# Patient Record
Sex: Female | Born: 1965 | Race: Black or African American | Hispanic: No | Smoking: Never smoker
Health system: Southern US, Community
[De-identification: ages and names within clinical notes are randomized; demographics above are authoritative.]

## PROBLEM LIST (undated history)

## (undated) DIAGNOSIS — I10 Essential (primary) hypertension: Secondary | ICD-10-CM

## (undated) DIAGNOSIS — G4730 Sleep apnea, unspecified: Secondary | ICD-10-CM

## (undated) DIAGNOSIS — F419 Anxiety disorder, unspecified: Secondary | ICD-10-CM

## (undated) DIAGNOSIS — G2581 Restless legs syndrome: Secondary | ICD-10-CM

## (undated) DIAGNOSIS — F329 Major depressive disorder, single episode, unspecified: Secondary | ICD-10-CM

## (undated) DIAGNOSIS — N920 Excessive and frequent menstruation with regular cycle: Secondary | ICD-10-CM

## (undated) DIAGNOSIS — E785 Hyperlipidemia, unspecified: Secondary | ICD-10-CM

## (undated) DIAGNOSIS — K635 Polyp of colon: Secondary | ICD-10-CM

## (undated) DIAGNOSIS — T7840XA Allergy, unspecified, initial encounter: Secondary | ICD-10-CM

## (undated) DIAGNOSIS — F32A Depression, unspecified: Secondary | ICD-10-CM

## (undated) DIAGNOSIS — Z9889 Other specified postprocedural states: Secondary | ICD-10-CM

## (undated) DIAGNOSIS — R112 Nausea with vomiting, unspecified: Secondary | ICD-10-CM

## (undated) DIAGNOSIS — R7303 Prediabetes: Secondary | ICD-10-CM

## (undated) DIAGNOSIS — E669 Obesity, unspecified: Secondary | ICD-10-CM

## (undated) DIAGNOSIS — K219 Gastro-esophageal reflux disease without esophagitis: Secondary | ICD-10-CM

## (undated) HISTORY — DX: Obesity, unspecified: E66.9

## (undated) HISTORY — DX: Restless legs syndrome: G25.81

## (undated) HISTORY — DX: Major depressive disorder, single episode, unspecified: F32.9

## (undated) HISTORY — DX: Allergy, unspecified, initial encounter: T78.40XA

## (undated) HISTORY — DX: Sleep apnea, unspecified: G47.30

## (undated) HISTORY — PX: TUBAL LIGATION: SHX77

## (undated) HISTORY — PX: FOOT SURGERY: SHX648

## (undated) HISTORY — DX: Hyperlipidemia, unspecified: E78.5

## (undated) HISTORY — DX: Excessive and frequent menstruation with regular cycle: N92.0

## (undated) HISTORY — DX: Polyp of colon: K63.5

## (undated) HISTORY — DX: Gastro-esophageal reflux disease without esophagitis: K21.9

## (undated) HISTORY — DX: Depression, unspecified: F32.A

## (undated) HISTORY — PX: COLONOSCOPY: SHX174

## (undated) HISTORY — DX: Essential (primary) hypertension: I10

## (undated) HISTORY — DX: Anxiety disorder, unspecified: F41.9

---

## 1998-01-27 ENCOUNTER — Encounter: Admission: RE | Admit: 1998-01-27 | Discharge: 1998-01-27 | Payer: Self-pay | Admitting: *Deleted

## 1998-09-15 ENCOUNTER — Encounter: Payer: Self-pay | Admitting: Gastroenterology

## 2000-11-14 ENCOUNTER — Emergency Department (HOSPITAL_COMMUNITY): Admission: EM | Admit: 2000-11-14 | Discharge: 2000-11-14 | Payer: Self-pay | Admitting: Emergency Medicine

## 2002-12-05 ENCOUNTER — Other Ambulatory Visit: Admission: RE | Admit: 2002-12-05 | Discharge: 2002-12-05 | Payer: Self-pay | Admitting: Obstetrics and Gynecology

## 2003-09-18 ENCOUNTER — Emergency Department (HOSPITAL_COMMUNITY): Admission: EM | Admit: 2003-09-18 | Discharge: 2003-09-18 | Payer: Self-pay | Admitting: Emergency Medicine

## 2003-10-18 DIAGNOSIS — K635 Polyp of colon: Secondary | ICD-10-CM

## 2003-10-18 HISTORY — DX: Polyp of colon: K63.5

## 2003-11-14 ENCOUNTER — Encounter: Payer: Self-pay | Admitting: Gastroenterology

## 2004-03-06 ENCOUNTER — Encounter: Payer: Self-pay | Admitting: Pulmonary Disease

## 2004-03-06 ENCOUNTER — Ambulatory Visit (HOSPITAL_BASED_OUTPATIENT_CLINIC_OR_DEPARTMENT_OTHER): Admission: RE | Admit: 2004-03-06 | Discharge: 2004-03-06 | Payer: Self-pay | Admitting: Internal Medicine

## 2004-06-16 ENCOUNTER — Ambulatory Visit: Payer: Self-pay | Admitting: Internal Medicine

## 2004-09-11 ENCOUNTER — Ambulatory Visit: Payer: Self-pay | Admitting: Internal Medicine

## 2004-11-11 ENCOUNTER — Other Ambulatory Visit: Admission: RE | Admit: 2004-11-11 | Discharge: 2004-11-11 | Payer: Self-pay | Admitting: Obstetrics and Gynecology

## 2004-11-27 ENCOUNTER — Encounter: Admission: RE | Admit: 2004-11-27 | Discharge: 2004-11-27 | Payer: Self-pay | Admitting: Obstetrics and Gynecology

## 2005-07-19 HISTORY — PX: ENDOMETRIAL ABLATION: SHX621

## 2006-01-18 ENCOUNTER — Other Ambulatory Visit: Admission: RE | Admit: 2006-01-18 | Discharge: 2006-01-18 | Payer: Self-pay | Admitting: Obstetrics and Gynecology

## 2006-02-04 ENCOUNTER — Ambulatory Visit (HOSPITAL_COMMUNITY): Admission: RE | Admit: 2006-02-04 | Discharge: 2006-02-04 | Payer: Self-pay | Admitting: Obstetrics and Gynecology

## 2006-04-07 ENCOUNTER — Ambulatory Visit: Payer: Self-pay | Admitting: Internal Medicine

## 2006-04-08 ENCOUNTER — Ambulatory Visit: Payer: Self-pay | Admitting: Internal Medicine

## 2006-04-20 ENCOUNTER — Ambulatory Visit: Payer: Self-pay | Admitting: Internal Medicine

## 2006-11-29 ENCOUNTER — Ambulatory Visit: Payer: Self-pay | Admitting: Internal Medicine

## 2007-03-22 ENCOUNTER — Ambulatory Visit: Payer: Self-pay | Admitting: Internal Medicine

## 2007-03-22 LAB — CONVERTED CEMR LAB
AST: 16 units/L (ref 0–37)
Albumin: 4.3 g/dL (ref 3.5–5.2)
Basophils Relative: 0.1 % (ref 0.0–1.0)
Bilirubin, Direct: 0.1 mg/dL (ref 0.0–0.3)
CO2: 32 meq/L (ref 19–32)
Chloride: 109 meq/L (ref 96–112)
Cholesterol: 195 mg/dL (ref 0–200)
Creatinine, Ser: 0.8 mg/dL (ref 0.4–1.2)
Eosinophils Relative: 0.3 % (ref 0.0–5.0)
Glucose, Bld: 92 mg/dL (ref 70–99)
HCT: 38.3 % (ref 36.0–46.0)
HDL: 41.5 mg/dL (ref >39.0)
Hemoglobin, Urine: NEGATIVE
Hemoglobin: 13.1 g/dL (ref 12.0–15.0)
Ketones, ur: NEGATIVE mg/dL
LDL Cholesterol: 138 mg/dL — ABNORMAL HIGH (ref 0–99)
Leukocytes, UA: NEGATIVE
MCHC: 34.3 g/dL (ref 30.0–36.0)
Monocytes Absolute: 0.6 10*3/uL (ref 0.2–0.7)
Neutrophils Relative %: 54.8 % (ref 43.0–77.0)
Nitrite: NEGATIVE
Potassium: 4.2 meq/L (ref 3.5–5.1)
RBC: 4.08 M/uL (ref 3.87–5.11)
RDW: 12.8 % (ref 11.5–14.6)
Sodium: 146 meq/L — ABNORMAL HIGH (ref 135–145)
TSH: 0.68 microintl units/mL (ref 0.35–5.50)
Total Bilirubin: 1.1 mg/dL (ref 0.3–1.2)
Total Protein: 7.7 g/dL (ref 6.0–8.3)
Urobilinogen, UA: 0.2 (ref 0.0–1.0)
VLDL: 16 mg/dL (ref 0–40)
WBC: 7.3 10*3/uL (ref 4.5–10.5)
pH: 6 (ref 5.0–8.0)

## 2007-03-23 ENCOUNTER — Ambulatory Visit: Payer: Self-pay | Admitting: Internal Medicine

## 2007-03-26 ENCOUNTER — Encounter: Payer: Self-pay | Admitting: Internal Medicine

## 2007-03-26 DIAGNOSIS — Z860100 Personal history of colon polyps, unspecified: Secondary | ICD-10-CM | POA: Insufficient documentation

## 2007-03-26 DIAGNOSIS — F329 Major depressive disorder, single episode, unspecified: Secondary | ICD-10-CM

## 2007-03-26 DIAGNOSIS — J309 Allergic rhinitis, unspecified: Secondary | ICD-10-CM

## 2007-03-26 DIAGNOSIS — I10 Essential (primary) hypertension: Secondary | ICD-10-CM | POA: Insufficient documentation

## 2007-03-26 DIAGNOSIS — F411 Generalized anxiety disorder: Secondary | ICD-10-CM

## 2007-03-26 DIAGNOSIS — K219 Gastro-esophageal reflux disease without esophagitis: Secondary | ICD-10-CM | POA: Insufficient documentation

## 2007-03-26 DIAGNOSIS — E785 Hyperlipidemia, unspecified: Secondary | ICD-10-CM | POA: Insufficient documentation

## 2007-03-26 DIAGNOSIS — K589 Irritable bowel syndrome without diarrhea: Secondary | ICD-10-CM

## 2007-03-26 DIAGNOSIS — J320 Chronic maxillary sinusitis: Secondary | ICD-10-CM | POA: Insufficient documentation

## 2007-04-05 ENCOUNTER — Ambulatory Visit: Payer: Self-pay | Admitting: Internal Medicine

## 2007-06-08 ENCOUNTER — Telehealth (INDEPENDENT_AMBULATORY_CARE_PROVIDER_SITE_OTHER): Payer: Self-pay | Admitting: *Deleted

## 2007-07-05 ENCOUNTER — Ambulatory Visit: Payer: Self-pay | Admitting: Internal Medicine

## 2007-08-29 ENCOUNTER — Ambulatory Visit: Payer: Self-pay | Admitting: Internal Medicine

## 2007-08-30 ENCOUNTER — Telehealth: Payer: Self-pay | Admitting: Internal Medicine

## 2007-10-03 ENCOUNTER — Encounter: Payer: Self-pay | Admitting: Internal Medicine

## 2007-12-26 ENCOUNTER — Encounter: Payer: Self-pay | Admitting: Internal Medicine

## 2008-01-02 ENCOUNTER — Ambulatory Visit: Payer: Self-pay | Admitting: Internal Medicine

## 2008-01-03 LAB — CONVERTED CEMR LAB

## 2008-01-08 ENCOUNTER — Other Ambulatory Visit: Admission: RE | Admit: 2008-01-08 | Discharge: 2008-01-08 | Payer: Self-pay | Admitting: Gynecology

## 2008-07-16 ENCOUNTER — Ambulatory Visit: Payer: Self-pay | Admitting: Internal Medicine

## 2008-07-16 DIAGNOSIS — G4710 Hypersomnia, unspecified: Secondary | ICD-10-CM | POA: Insufficient documentation

## 2008-07-17 ENCOUNTER — Encounter: Admission: RE | Admit: 2008-07-17 | Discharge: 2008-07-17 | Payer: Self-pay | Admitting: Gynecology

## 2008-07-17 ENCOUNTER — Encounter (INDEPENDENT_AMBULATORY_CARE_PROVIDER_SITE_OTHER): Payer: Self-pay | Admitting: *Deleted

## 2008-08-09 ENCOUNTER — Ambulatory Visit: Payer: Self-pay | Admitting: Pulmonary Disease

## 2008-09-06 ENCOUNTER — Encounter (INDEPENDENT_AMBULATORY_CARE_PROVIDER_SITE_OTHER): Payer: Self-pay | Admitting: *Deleted

## 2008-09-18 ENCOUNTER — Ambulatory Visit: Payer: Self-pay | Admitting: Pulmonary Disease

## 2008-10-09 ENCOUNTER — Ambulatory Visit: Payer: Self-pay | Admitting: Gastroenterology

## 2008-11-22 ENCOUNTER — Ambulatory Visit: Payer: Self-pay | Admitting: Gastroenterology

## 2008-11-22 ENCOUNTER — Telehealth: Payer: Self-pay | Admitting: Gastroenterology

## 2008-12-23 ENCOUNTER — Ambulatory Visit: Payer: Self-pay | Admitting: Gastroenterology

## 2008-12-23 DIAGNOSIS — R1013 Epigastric pain: Secondary | ICD-10-CM

## 2009-01-07 ENCOUNTER — Ambulatory Visit: Payer: Self-pay | Admitting: Internal Medicine

## 2009-01-07 LAB — CONVERTED CEMR LAB
ALT: 6 units/L (ref 0–35)
AST: 19 units/L (ref 0–37)
Albumin: 3.9 g/dL (ref 3.5–5.2)
Alkaline Phosphatase: 35 units/L — ABNORMAL LOW (ref 39–117)
BUN: 10 mg/dL (ref 6–23)
Basophils Absolute: 0 10*3/uL (ref 0.0–0.1)
Basophils Relative: 0.2 % (ref 0.0–3.0)
Bilirubin Urine: NEGATIVE
Bilirubin, Direct: 0.1 mg/dL (ref 0.0–0.3)
CO2: 28 meq/L (ref 19–32)
Calcium: 9.3 mg/dL (ref 8.4–10.5)
Chloride: 105 meq/L (ref 96–112)
Cholesterol: 182 mg/dL (ref 0–200)
Creatinine, Ser: 0.8 mg/dL (ref 0.4–1.2)
Eosinophils Absolute: 0 10*3/uL (ref 0.0–0.7)
Eosinophils Relative: 0.5 % (ref 0.0–5.0)
GFR calc non Af Amer: 101.89 mL/min (ref >60)
Glucose, Bld: 76 mg/dL (ref 70–99)
HCT: 35 % — ABNORMAL LOW (ref 36.0–46.0)
HDL: 41.7 mg/dL (ref >39.00)
Hemoglobin, Urine: NEGATIVE
Hemoglobin: 12.1 g/dL (ref 12.0–15.0)
Ketones, ur: NEGATIVE mg/dL
LDL Cholesterol: 129 mg/dL — ABNORMAL HIGH (ref 0–99)
Leukocytes, UA: NEGATIVE
Lymphocytes Relative: 31.4 % (ref 12.0–46.0)
Lymphs Abs: 2.2 10*3/uL (ref 0.7–4.0)
MCHC: 34.6 g/dL (ref 30.0–36.0)
MCV: 92.8 fL (ref 78.0–100.0)
Monocytes Absolute: 0.6 10*3/uL (ref 0.1–1.0)
Monocytes Relative: 9.1 % (ref 3.0–12.0)
Neutro Abs: 4.2 10*3/uL (ref 1.4–7.7)
Neutrophils Relative %: 58.8 % (ref 43.0–77.0)
Nitrite: NEGATIVE
Platelets: 173 10*3/uL (ref 150.0–400.0)
Potassium: 3.9 meq/L (ref 3.5–5.1)
RBC: 3.77 M/uL — ABNORMAL LOW (ref 3.87–5.11)
RDW: 13 % (ref 11.5–14.6)
Sodium: 140 meq/L (ref 135–145)
Specific Gravity, Urine: 1.015 (ref 1.000–1.030)
TSH: 0.48 microintl units/mL (ref 0.35–5.50)
Total Bilirubin: 1 mg/dL (ref 0.3–1.2)
Total CHOL/HDL Ratio: 4
Total Protein, Urine: NEGATIVE mg/dL
Total Protein: 7.3 g/dL (ref 6.0–8.3)
Triglycerides: 56 mg/dL (ref 0.0–149.0)
Urine Glucose: NEGATIVE mg/dL
Urobilinogen, UA: 0.2 (ref 0.0–1.0)
VLDL: 11.2 mg/dL (ref 0.0–40.0)
WBC: 7 10*3/uL (ref 4.5–10.5)
pH: 6.5 (ref 5.0–8.0)

## 2009-01-17 ENCOUNTER — Ambulatory Visit: Payer: Self-pay | Admitting: Gastroenterology

## 2009-01-17 ENCOUNTER — Encounter: Payer: Self-pay | Admitting: Gastroenterology

## 2009-01-17 LAB — HM COLONOSCOPY

## 2009-01-22 ENCOUNTER — Encounter: Payer: Self-pay | Admitting: Gastroenterology

## 2009-05-15 ENCOUNTER — Ambulatory Visit: Payer: Self-pay | Admitting: Women's Health

## 2009-05-15 ENCOUNTER — Encounter: Payer: Self-pay | Admitting: Women's Health

## 2009-05-15 ENCOUNTER — Other Ambulatory Visit: Admission: RE | Admit: 2009-05-15 | Discharge: 2009-05-15 | Payer: Self-pay | Admitting: Gynecology

## 2009-05-26 ENCOUNTER — Emergency Department (HOSPITAL_COMMUNITY): Admission: EM | Admit: 2009-05-26 | Discharge: 2009-05-26 | Payer: Self-pay | Admitting: Emergency Medicine

## 2009-06-22 ENCOUNTER — Emergency Department (HOSPITAL_COMMUNITY): Admission: EM | Admit: 2009-06-22 | Discharge: 2009-06-22 | Payer: Self-pay | Admitting: Family Medicine

## 2009-11-27 ENCOUNTER — Telehealth (INDEPENDENT_AMBULATORY_CARE_PROVIDER_SITE_OTHER): Payer: Self-pay | Admitting: *Deleted

## 2009-12-17 LAB — HM MAMMOGRAPHY: HM Mammogram: NORMAL

## 2009-12-29 ENCOUNTER — Ambulatory Visit: Payer: Self-pay | Admitting: Internal Medicine

## 2009-12-30 ENCOUNTER — Encounter: Admission: RE | Admit: 2009-12-30 | Discharge: 2009-12-30 | Payer: Self-pay | Admitting: Gynecology

## 2009-12-30 LAB — CONVERTED CEMR LAB
AST: 15 units/L (ref 0–37)
Albumin: 4.1 g/dL (ref 3.5–5.2)
Alkaline Phosphatase: 38 units/L — ABNORMAL LOW (ref 39–117)
BUN: 13 mg/dL (ref 6–23)
Basophils Absolute: 0 10*3/uL (ref 0.0–0.1)
CO2: 29 meq/L (ref 19–32)
Chloride: 108 meq/L (ref 96–112)
Creatinine, Ser: 0.7 mg/dL (ref 0.4–1.2)
Glucose, Bld: 86 mg/dL (ref 70–99)
Hemoglobin: 12.2 g/dL (ref 12.0–15.0)
Ketones, ur: NEGATIVE mg/dL
LDL Cholesterol: 131 mg/dL — ABNORMAL HIGH (ref 0–99)
Lymphocytes Relative: 21.4 % (ref 12.0–46.0)
Monocytes Relative: 7.7 % (ref 3.0–12.0)
Neutro Abs: 4.6 10*3/uL (ref 1.4–7.7)
RBC: 3.79 M/uL — ABNORMAL LOW (ref 3.87–5.11)
RDW: 14.4 % (ref 11.5–14.6)
Specific Gravity, Urine: >=1.03 (ref 1.000–1.030)
Total CHOL/HDL Ratio: 4
Total Protein, Urine: NEGATIVE mg/dL
Triglycerides: 38 mg/dL (ref 0.0–149.0)
Urine Glucose: NEGATIVE mg/dL
Urobilinogen, UA: 0.2 (ref 0.0–1.0)
pH: 5.5 (ref 5.0–8.0)

## 2010-01-14 ENCOUNTER — Ambulatory Visit: Payer: Self-pay | Admitting: Internal Medicine

## 2010-01-14 DIAGNOSIS — R220 Localized swelling, mass and lump, head: Secondary | ICD-10-CM | POA: Insufficient documentation

## 2010-01-14 DIAGNOSIS — R221 Localized swelling, mass and lump, neck: Secondary | ICD-10-CM

## 2010-01-20 ENCOUNTER — Encounter: Payer: Self-pay | Admitting: Internal Medicine

## 2010-01-26 ENCOUNTER — Encounter: Admission: RE | Admit: 2010-01-26 | Discharge: 2010-01-26 | Payer: Self-pay | Admitting: Otolaryngology

## 2010-03-17 ENCOUNTER — Encounter (INDEPENDENT_AMBULATORY_CARE_PROVIDER_SITE_OTHER): Payer: Self-pay | Admitting: *Deleted

## 2010-03-17 ENCOUNTER — Ambulatory Visit: Payer: Self-pay | Admitting: Internal Medicine

## 2010-03-17 DIAGNOSIS — H6690 Otitis media, unspecified, unspecified ear: Secondary | ICD-10-CM | POA: Insufficient documentation

## 2010-03-18 ENCOUNTER — Telehealth: Payer: Self-pay | Admitting: Internal Medicine

## 2010-05-27 ENCOUNTER — Emergency Department (HOSPITAL_COMMUNITY): Admission: EM | Admit: 2010-05-27 | Discharge: 2010-05-27 | Payer: Self-pay | Admitting: Emergency Medicine

## 2010-06-12 ENCOUNTER — Encounter: Admission: RE | Admit: 2010-06-12 | Discharge: 2010-06-12 | Payer: Self-pay | Admitting: Otolaryngology

## 2010-06-16 ENCOUNTER — Ambulatory Visit: Payer: Self-pay | Admitting: Internal Medicine

## 2010-06-16 DIAGNOSIS — R062 Wheezing: Secondary | ICD-10-CM

## 2010-06-18 HISTORY — PX: SALIVARY GLAND SURGERY: SHX768

## 2010-06-29 ENCOUNTER — Ambulatory Visit
Admission: RE | Admit: 2010-06-29 | Discharge: 2010-06-30 | Payer: Self-pay | Source: Home / Self Care | Attending: Otolaryngology | Admitting: Otolaryngology

## 2010-08-09 ENCOUNTER — Encounter: Payer: Self-pay | Admitting: Gynecology

## 2010-08-13 NOTE — Op Note (Addendum)
NAMEHARRY, [name]                ACCOUNT NO.:  [id number]  MEDICAL RECORD NO.:  [id number]          PATIENT TYPE:  AMB  LOCATION:  DSC                          FACILITY:  [facility]  PHYSICIAN:  Jefry H. Pollyann Kennedy, MD     DATE OF BIRTH:  1966-06-06  DATE OF PROCEDURE:  06/29/2010 DATE OF DISCHARGE:                              OPERATIVE REPORT   PREOPERATIVE DIAGNOSIS:  Left parotid mass.  POSTOPERATIVE DIAGNOSIS:  Left parotid mass.  PROCEDURE:  Left superficial parotidectomy with facial nerve dissection.  ANESTHESIA:  General endotracheal anesthesia was used.  COMPLICATIONS:  None.  BLOOD LOSS:  Less than 100 mL.  FINDINGS:  A very soft, fleshy mass involving the deep part of the tail of parotid.  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  HISTORY:  This is a 45 year old with a history of a mass in the left parotid.  Risks, benefits, alternatives, and complications to the procedure were explained, she seemed to understand and agreed to surgery.  PROCEDURE:  The patient was taken to the operating room and placed on the operating table in supine position.  Following induction of general endotracheal anesthesia, the left side of the face was prepped and draped in a standard fashion.  A preauricular incision was outlined with a marking pen with extension around the earlobe and down around the mastoid process staying as close to the hairline as possible.  Xylocaine 1% with epinephrine was infiltrated along the incision.  A #15 scalpel was used to incise the skin and subcutaneous tissue, and the electrocautery was used to provide the deeper dissection.  The greater auricular nerve branch to the earlobe was identified and preserved all the way down to sternocleidomastoid muscle and reflected posteriorly. The parotid tissue was dissected off the upper sternocleidomastoid muscle and then off the external auditory canal.  The gland was brought forward while  exposing the posterior belly of the digastric muscle.  At this point, the main trunk was identified just deep to the tympanomastoid suture line.  The main trunk was dissected out peripherally starting this with the superior division.  All of the branches were dissected superiorly and the gland was reflected off inferiorly.  A McCabe dissector was used to stay just above the plane of the nerve and bipolar electrocautery was used for hemostasis and sharp dissection for cutting the parotid tissue.  The 4-0 silk ties were used to ligate the larger vessels that were identified.  As the gland was brought inferiorly, the lower division was then identified and a similar dissection was accomplished.  All major branches were identified and preserved.  The gland was brought down inferiorly and the tumor mass was seen just below the lowest branch of the nerve.  The tumor was somewhat friable and sample was sent for frozen section evaluation.  This was consistent with pleomorphic adenoma.  The lesion was completely dissected from surrounding tissue and sent with the normal parotid gland for pathologic evaluation.  The wound was palpated and there were no further masses identified.  The wound was irrigated with saline and hemostasis was completed.  A battery nerve  stimulator was then used to stimulate the main trunk in the upper and lower divisions and there was good movement throughout.  The wound was then closed in layers using 4-0 chromic on the subcuticular layer and Dermabond on the skin.  A 7-French round JP drain was left in the wound, exited through the inferior aspect of the incision, and secured in place with nylon suture.  The patient was awakened, extubated, and transferred to the recovery in stable condition.     Jefry H. Pollyann Kennedy, MD     JHR/MEDQ  D:  06/29/2010  T:  06/29/2010  Job:  [id number]  Electronically Signed by Serena Colonel MD on 08/13/2010 10:34:08 AM

## 2010-08-20 NOTE — Assessment & Plan Note (Signed)
Summary: CPX /NWS  #   Vital Signs:  Patient profile:   45 year old female Height:      61.5 inches Weight:      160.50 pounds BMI:     29.94 O2 Sat:      99 % on Room air Temp:     98.3 degrees F oral Pulse rate:   73 / minute BP sitting:   118 / 86  (left arm) Cuff size:   regular  Vitals Entered By: Zella Ball Ewing CMA Duncan Dull) (January 14, 2010 10:51 AM)  O2 Flow:  Room air  Preventive Care Screening  Mammogram:    Date:  12/17/2009    Results:  normal   Pap Smear:    Date:  04/18/2009    Results:  normal   CC: Adult Physical/RE   Primary Care Provider:  Oliver Barre, MD  CC:  Adult Physical/RE.  History of Present Illness: overall doing well, Pt denies CP, sob, doe, wheezing, orthopnea, pnd, worsening LE edema, palps, dizziness or syncope  Pt denies new neuro symptoms such as headache, facial or extremity weakness    No new complaints.    Problems Prior to Update: 1)  Swelling Mass or Lump in Head and Neck  (ICD-784.2) 2)  Preventive Health Care  (ICD-V70.0) 3)  Fm Hx Malignant Neoplasm Gastrointestinal Tract  (ICD-V16.0) 4)  Abdominal Pain, Epigastric  (ICD-789.06) 5)  Hypersomnia  (ICD-780.54) 6)  Contact With or Exposure To Venereal Diseases  (ICD-V01.6) 7)  Uri  (ICD-465.9) 8)  Ibs  (ICD-564.1) 9)  Colonic Polyps, Hx of  (ICD-V12.72) 10)  Gerd  (ICD-530.81) 11)  Allergic Rhinitis  (ICD-477.9) 12)  Hypertension  (ICD-401.9) 13)  Hyperlipidemia  (ICD-272.4) 14)  Depression  (ICD-311) 15)  Anxiety  (ICD-300.00)  Medications Prior to Update: 1)  Hydrochlorothiazide 12.5 Mg Caps (Hydrochlorothiazide) .Marland Kitchen.. 1 Capsule By Mouth Once A Day 2)  Lisinopril 5 Mg Tabs (Lisinopril) .... Take 1 Tablet By Mouth Once A Day 3)  Omeprazole 40 Mg Cpdr (Omeprazole) .... One Each Night At Bedtime 4)  Veramyst 27.5 Mcg/spray  Susp (Fluticasone Furoate) .... Two Puffs Each Nostril Daily  Current Medications (verified): 1)  Hydrochlorothiazide 12.5 Mg Caps (Hydrochlorothiazide)  .Marland Kitchen.. 1 Capsule By Mouth Once A Day 2)  Lisinopril 5 Mg Tabs (Lisinopril) .... Take 1 Tablet By Mouth Once A Day 3)  Omeprazole 40 Mg Cpdr (Omeprazole) .... One Each Night At Bedtime 4)  Veramyst 27.5 Mcg/spray  Susp (Fluticasone Furoate) .... Two Puffs Each Nostril Daily  Allergies (verified): No Known Drug Allergies  Past History:  Past Medical History: Last updated: 12/23/2008 Anxiety Depression Hyperlipidemia Hypertension Obesity Allergic rhinitis GERD Hyperplastic colon polyp 10/2003 IBS RLS Menorrhagia  Past Surgical History: Last updated: 03/26/2007 Tubal ligation  Family History: Last updated: 12/23/2008 Family History High cholesterol Family History Hypertension grandfather with MI Mother with colon cancer at 87 Family History of Colon Polyps: Father  Social History: Last updated: 01/07/2009 Never Smoked Alcohol use-yes-occasional  pt is single. Daily Caffeine Use-1 glass daily Illicit Drug Use - no Patient does not get regular exercise.  2 children - 1 grown  Risk Factors: Exercise: no (12/23/2008)  Risk Factors: Smoking Status: never (07/05/2007)  Review of Systems  The patient denies anorexia, fever, weight loss, weight gain, vision loss, decreased hearing, hoarseness, chest pain, syncope, dyspnea on exertion, peripheral edema, prolonged cough, headaches, hemoptysis, abdominal pain, melena, hematochezia, severe indigestion/heartburn, hematuria, muscle weakness, suspicious skin lesions, transient blindness, difficulty walking, depression, unusual  weight change, abnormal bleeding, enlarged lymph nodes, and angioedema.         all otherwise negative per pt -    Physical Exam  General:  alert and well-developed.   Head:  normocephalic and atraumatic.   Eyes:  vision grossly intact, pupils equal, and pupils round.   Ears:  R ear normal and L ear normal.   Nose:  no external deformity and no nasal discharge.   Mouth:  no gingival abnormalities and  pharynx pink and moist.   Neck:  supple.  with mass to left neck below left angle of the jaw approx 1 cm, firm, nontender;  no other LA or other mass Lungs:  normal respiratory effort and normal breath sounds.   Heart:  normal rate and regular rhythm.   Abdomen:  soft, non-tender, and normal bowel sounds.   Msk:  no joint tenderness and no joint swelling.   Extremities:  no edema, no erythema  Neurologic:  cranial nerves II-XII intact and strength normal in all extremities.   Skin:  color normal and no rashes.   Psych:  not anxious appearing and not depressed appearing.     Impression & Recommendations:  Problem # 1:  Preventive Health Care (ICD-V70.0) Overall doing well, age appropriate education and counseling updated and referral for appropriate preventive services done unless declined, immunizations up to date or declined, diet counseling done if overweight, urged to quit smoking if smokes , most recent labs reviewed and current ordered if appropriate, ecg reviewed or declined (interpretation per ECG scanned in the EMR if done); information regarding Medicare Prevention requirements given if appropriate; speciality referrals updated as appropriate  Orders: EKG w/ Interpretation (93000)  Problem # 2:  SWELLING MASS OR LUMP IN HEAD AND NECK (ICD-784.2)  left near angle of jaw - ? larger - for ENT referral  Orders: ENT Referral (ENT)  Complete Medication List: 1)  Hydrochlorothiazide 12.5 Mg Caps (Hydrochlorothiazide) .Marland Kitchen.. 1 capsule by mouth once a day 2)  Lisinopril 5 Mg Tabs (Lisinopril) .... Take 1 tablet by mouth once a day 3)  Omeprazole 40 Mg Cpdr (Omeprazole) .... One each night at bedtime 4)  Veramyst 27.5 Mcg/spray Susp (Fluticasone furoate) .... Two puffs each nostril daily  Patient Instructions: 1)  Continue all previous medications as before this visit 2)  You will be contacted about the referral(s) to: ENT 3)  Please schedule a follow-up appointment in 1 year or  sooner if needed Prescriptions: OMEPRAZOLE 40 MG CPDR (OMEPRAZOLE) one each night at bedtime  #90 x 3   Entered and Authorized by:   Corwin Levins MD   Signed by:   Corwin Levins MD on 01/14/2010   Method used:   Print then Give to Patient   RxID:   [id number] LISINOPRIL 5 MG TABS (LISINOPRIL) Take 1 tablet by mouth once a day  #90 x 3   Entered and Authorized by:   Corwin Levins MD   Signed by:   Corwin Levins MD on 01/14/2010   Method used:   Print then Give to Patient   RxID:   [id number] HYDROCHLOROTHIAZIDE 12.5 MG CAPS (HYDROCHLOROTHIAZIDE) 1 capsule by mouth once a day  #90 x 3   Entered and Authorized by:   Corwin Levins MD   Signed by:   Corwin Levins MD on 01/14/2010   Method used:   Print then Give to Patient   RxID:   [id number]

## 2010-08-20 NOTE — Assessment & Plan Note (Signed)
Summary: COUGH---STC   Vital Signs:  Patient profile:   45 year old female Height:      61.5 inches Weight:      156.25 pounds BMI:     29.15 O2 Sat:      98 % on Room air Temp:     98.2 degrees F oral Pulse rate:   63 / minute BP sitting:   110 / 78  (left arm) Cuff size:   regular  Vitals Entered By: Zella Ball Ewing CMA Duncan Dull) (June 16, 2010 4:33 PM)  O2 Flow:  Room air CC: Coughing for 1 month/RE   Primary Care Raniya Golembeski:  Oliver Barre, MD  CC:  Coughing for 1 month/RE.  History of Present Illness: here with c/o acute onset 3 days worsening mild to mod fever, ST, now with prod cough greenish sputum (wtih some cough off and onf for 4 wks) and midl wheezing, but Pt denies CP, worsening sob, doe, orthopnea, pnd, worsening LE edema, palps, dizziness or syncope  Has some upper back soreness with coughing as well, not clear but may have had increase allergies as well in the past few wks.  Pt denies new neuro symptoms such as headache, facial or extremity weakness  No recent wt loss, night sweats, loss of appetite or other constitutional symptoms .  Does have lump to left neck near the angle of jaw - seeing ENT with MRI results penidng for this.  Pt denies polydipsia, polyuria.  Overall good compliance with meds, and good tolerability.   Problems Prior to Update: 1)  Wheezing  (ICD-786.07) 2)  Bronchitis-acute  (ICD-466.0) 3)  Otitis Media, Bilateral  (ICD-382.9) 4)  Swelling Mass or Lump in Head and Neck  (ICD-784.2) 5)  Preventive Health Care  (ICD-V70.0) 6)  Fm Hx Malignant Neoplasm Gastrointestinal Tract  (ICD-V16.0) 7)  Abdominal Pain, Epigastric  (ICD-789.06) 8)  Hypersomnia  (ICD-780.54) 9)  Contact With or Exposure To Venereal Diseases  (ICD-V01.6) 10)  Uri  (ICD-465.9) 11)  Ibs  (ICD-564.1) 12)  Colonic Polyps, Hx of  (ICD-V12.72) 13)  Gerd  (ICD-530.81) 14)  Allergic Rhinitis  (ICD-477.9) 15)  Hypertension  (ICD-401.9) 16)  Hyperlipidemia  (ICD-272.4) 17)  Depression   (ICD-311) 18)  Anxiety  (ICD-300.00)  Medications Prior to Update: 1)  Hydrochlorothiazide 12.5 Mg Caps (Hydrochlorothiazide) .Marland Kitchen.. 1 Capsule By Mouth Once A Day 2)  Lisinopril 5 Mg Tabs (Lisinopril) .... Take 1 Tablet By Mouth Once A Day 3)  Omeprazole 40 Mg Cpdr (Omeprazole) .... One Each Night At Bedtime 4)  Veramyst 27.5 Mcg/spray  Susp (Fluticasone Furoate) .... Two Puffs Each Nostril Daily 5)  Cephalexin 500 Mg Caps (Cephalexin) .Marland Kitchen.. 1p O Three Times A Day 6)  Citalopram Hydrobromide 10 Mg Tabs (Citalopram Hydrobromide) .Marland Kitchen.. 1po Once Daily  Current Medications (verified): 1)  Hydrochlorothiazide 12.5 Mg Caps (Hydrochlorothiazide) .Marland Kitchen.. 1 Capsule By Mouth Once A Day 2)  Lisinopril 5 Mg Tabs (Lisinopril) .... Take 1 Tablet By Mouth Once A Day 3)  Omeprazole 40 Mg Cpdr (Omeprazole) .... One Each Night At Bedtime 4)  Veramyst 27.5 Mcg/spray  Susp (Fluticasone Furoate) .... Two Puffs Each Nostril Daily 5)  Azithromycin 250 Mg Tabs (Azithromycin) .... 2po Qd For 1 Day, Then 1po Qd For 4days, Then Stop 6)  Citalopram Hydrobromide 10 Mg Tabs (Citalopram Hydrobromide) .Marland Kitchen.. 1po Once Daily 7)  Prednisone 10 Mg Tabs (Prednisone) .... 3po Qd For 3days, Then 2po Qd For 3days, Then 1po Qd For 3days, Then Stop 8)  Hydrocodone-Homatropine 5-1.5  Mg/39ml Syrp (Hydrocodone-Homatropine) .Marland Kitchen.. 1 Tsp Ppo Q 6 Hrs As Needed Cough 9)  Proair Hfa 108 (90 Base) Mcg/act Aers (Albuterol Sulfate) .... 2 Puffs Qid As Needed  Allergies (verified): No Known Drug Allergies  Past History:  Past Medical History: Last updated: 12/23/2008 Anxiety Depression Hyperlipidemia Hypertension Obesity Allergic rhinitis GERD Hyperplastic colon polyp 10/2003 IBS RLS Menorrhagia  Past Surgical History: Last updated: 03/26/2007 Tubal ligation  Social History: Last updated: 01/07/2009 Never Smoked Alcohol use-yes-occasional  pt is single. Daily Caffeine Use-1 glass daily Illicit Drug Use - no Patient does not get  regular exercise.  2 children - 1 grown  Risk Factors: Exercise: no (12/23/2008)  Risk Factors: Smoking Status: never (07/05/2007)  Review of Systems       all otherwise negative per pt -    Physical Exam  General:  alert and overweight-appearing.  , mild ill  Head:  normocephalic and atraumatic.   Eyes:  vision grossly intact, pupils equal, and pupils round.   Ears:  bilat tm's red - left more than right, sinus nontender Nose:  nasal dischargemucosal pallor and mucosal edema.   Mouth:  pharyngeal erythema and fair dentition.   Neck:  supple and cervical lymphadenopathy.  with lump persistent to the left neck near angle of jaw Lungs:  normal respiratory effort, R decreased breath sounds, and L decreased breath sounds.  wtih very mild wheeze bilat Heart:  normal rate and regular rhythm.   Extremities:  no edema, no erythema    Impression & Recommendations:  Problem # 1:  BRONCHITIS-ACUTE (ICD-466.0)  Her updated medication list for this problem includes:    Azithromycin 250 Mg Tabs (Azithromycin) .Marland Kitchen... 2po qd for 1 day, then 1po qd for 4days, then stop    Hydrocodone-homatropine 5-1.5 Mg/33ml Syrp (Hydrocodone-homatropine) .Marland Kitchen... 1 tsp ppo q 6 hrs as needed cough    Proair Hfa 108 (90 Base) Mcg/act Aers (Albuterol sulfate) .Marland Kitchen... 2 puffs qid as needed treat as above, f/u any worsening signs or symptoms , and cough med as well  Problem # 2:  WHEEZING (ICD-786.07) mild, liekly due to above, for predpack for home  Problem # 3:  HYPERTENSION (ICD-401.9)  Her updated medication list for this problem includes:    Hydrochlorothiazide 12.5 Mg Caps (Hydrochlorothiazide) .Marland Kitchen... 1 capsule by mouth once a day    Lisinopril 5 Mg Tabs (Lisinopril) .Marland Kitchen... Take 1 tablet by mouth once a day  BP today: 110/78 Prior BP: 146/92 (03/17/2010)  Labs Reviewed: K+: 4.3 (12/29/2009) Creat: : 0.7 (12/29/2009)   Chol: 179 (12/29/2009)   HDL: 40.70 (12/29/2009)   LDL: 131 (12/29/2009)   TG: 38.0  (12/29/2009) stable overall by hx and exam, ok to continue meds/tx as is   Complete Medication List: 1)  Hydrochlorothiazide 12.5 Mg Caps (Hydrochlorothiazide) .Marland Kitchen.. 1 capsule by mouth once a day 2)  Lisinopril 5 Mg Tabs (Lisinopril) .... Take 1 tablet by mouth once a day 3)  Omeprazole 40 Mg Cpdr (Omeprazole) .... One each night at bedtime 4)  Veramyst 27.5 Mcg/spray Susp (Fluticasone furoate) .... Two puffs each nostril daily 5)  Azithromycin 250 Mg Tabs (Azithromycin) .... 2po qd for 1 day, then 1po qd for 4days, then stop 6)  Citalopram Hydrobromide 10 Mg Tabs (Citalopram hydrobromide) .Marland Kitchen.. 1po once daily 7)  Prednisone 10 Mg Tabs (Prednisone) .... 3po qd for 3days, then 2po qd for 3days, then 1po qd for 3days, then stop 8)  Hydrocodone-homatropine 5-1.5 Mg/28ml Syrp (Hydrocodone-homatropine) .Marland Kitchen.. 1 tsp ppo q 6 hrs  as needed cough 9)  Proair Hfa 108 (90 Base) Mcg/act Aers (Albuterol sulfate) .... 2 puffs qid as needed  Patient Instructions: 1)  Please take all new medications as prescribed 2)  Continue all previous medications as before this visit  3)  You can also use Mucinex OTC or it's generic for congestion  4)  Please schedule a follow-up appointment as needed. Prescriptions: PROAIR HFA 108 (90 BASE) MCG/ACT AERS (ALBUTEROL SULFATE) 2 puffs qid as needed  #1 x 11   Entered and Authorized by:   Corwin Levins MD   Signed by:   Corwin Levins MD on 06/16/2010   Method used:   Print then Give to Patient   RxID:   [id number] HYDROCODONE-HOMATROPINE 5-1.5 MG/5ML SYRP (HYDROCODONE-HOMATROPINE) 1 tsp ppo q 6 hrs as needed cough  #6 oz x 1   Entered and Authorized by:   Corwin Levins MD   Signed by:   Corwin Levins MD on 06/16/2010   Method used:   Print then Give to Patient   RxID:   [id number] PREDNISONE 10 MG TABS (PREDNISONE) 3po qd for 3days, then 2po qd for 3days, then 1po qd for 3days, then stop  #18 x 0   Entered and Authorized by:   Corwin Levins MD   Signed by:   Corwin Levins MD on 06/16/2010   Method used:   Print then Give to Patient   RxID:   [id number] AZITHROMYCIN 250 MG TABS (AZITHROMYCIN) 2po qd for 1 day, then 1po qd for 4days, then stop  #6 x 1   Entered and Authorized by:   Corwin Levins MD   Signed by:   Corwin Levins MD on 06/16/2010   Method used:   Print then Give to Patient   RxID:   [id number]    Orders Added: 1)  Est. Patient Level IV [88416]

## 2010-08-20 NOTE — Assessment & Plan Note (Signed)
Summary: headache/cd   Vital Signs:  Patient profile:   45 year old female Height:      61 inches Weight:      158.75 pounds BMI:     30.10 O2 Sat:      99 % on Room air Temp:     100.4 degrees F oral Pulse rate:   69 / minute BP sitting:   146 / 92  (left arm) Cuff size:   regular  Vitals Entered By: Zella Ball Ewing CMA Duncan Dull) (March 17, 2010 2:43 PM)  O2 Flow:  Room air CC: Headaches, dizzy, chills for 1 day/RE   Primary Care Provider:  Oliver Barre, MD  CC:  Headaches, dizzy, and chills for 1 day/RE.  History of Present Illness: saw ent - had CT and left neg for malignancy, for f/u 2 mo OV with ENT;  Pt denies CP, worsening sob, doe, wheezing, orthopnea, pnd, worsening LE edema, palps, dizziness or syncope  Pt denies new neuro symptoms such as headache, facial or extremity weakness  No  wt loss, night sweats, loss of appetite or other constitutional symptoms , but does have fever, bilat earache, with mild dizziness without ST or cough or wheezing.   Has significant anxiety with marked increased social stressors recent but declines tx at this time, and denies worsening depressive symtpoms, suicidal ideation or panic.    Problems Prior to Update: 1)  Otitis Media, Bilateral  (ICD-382.9) 2)  Swelling Mass or Lump in Head and Neck  (ICD-784.2) 3)  Preventive Health Care  (ICD-V70.0) 4)  Fm Hx Malignant Neoplasm Gastrointestinal Tract  (ICD-V16.0) 5)  Abdominal Pain, Epigastric  (ICD-789.06) 6)  Hypersomnia  (ICD-780.54) 7)  Contact With or Exposure To Venereal Diseases  (ICD-V01.6) 8)  Uri  (ICD-465.9) 9)  Ibs  (ICD-564.1) 10)  Colonic Polyps, Hx of  (ICD-V12.72) 11)  Gerd  (ICD-530.81) 12)  Allergic Rhinitis  (ICD-477.9) 13)  Hypertension  (ICD-401.9) 14)  Hyperlipidemia  (ICD-272.4) 15)  Depression  (ICD-311) 16)  Anxiety  (ICD-300.00)  Medications Prior to Update: 1)  Hydrochlorothiazide 12.5 Mg Caps (Hydrochlorothiazide) .Marland Kitchen.. 1 Capsule By Mouth Once A Day 2)  Lisinopril  5 Mg Tabs (Lisinopril) .... Take 1 Tablet By Mouth Once A Day 3)  Omeprazole 40 Mg Cpdr (Omeprazole) .... One Each Night At Bedtime 4)  Veramyst 27.5 Mcg/spray  Susp (Fluticasone Furoate) .... Two Puffs Each Nostril Daily  Current Medications (verified): 1)  Hydrochlorothiazide 12.5 Mg Caps (Hydrochlorothiazide) .Marland Kitchen.. 1 Capsule By Mouth Once A Day 2)  Lisinopril 5 Mg Tabs (Lisinopril) .... Take 1 Tablet By Mouth Once A Day 3)  Omeprazole 40 Mg Cpdr (Omeprazole) .... One Each Night At Bedtime 4)  Veramyst 27.5 Mcg/spray  Susp (Fluticasone Furoate) .... Two Puffs Each Nostril Daily 5)  Cephalexin 500 Mg Caps (Cephalexin) .Marland Kitchen.. 1p O Three Times A Day 6)  Citalopram Hydrobromide 10 Mg Tabs (Citalopram Hydrobromide) .Marland Kitchen.. 1po Once Daily  Allergies (verified): No Known Drug Allergies  Past History:  Past Medical History: Last updated: 12/23/2008 Anxiety Depression Hyperlipidemia Hypertension Obesity Allergic rhinitis GERD Hyperplastic colon polyp 10/2003 IBS RLS Menorrhagia  Past Surgical History: Last updated: 03/26/2007 Tubal ligation  Social History: Last updated: 01/07/2009 Never Smoked Alcohol use-yes-occasional  pt is single. Daily Caffeine Use-1 glass daily Illicit Drug Use - no Patient does not get regular exercise.  2 children - 1 grown  Risk Factors: Exercise: no (12/23/2008)  Risk Factors: Smoking Status: never (07/05/2007)  Review of Systems  all otherwise negative per pt -    Physical Exam  General:  alert and overweight-appearing.  , mild ill  Head:  normocephalic and atraumatic.   Eyes:  vision grossly intact, pupils equal, and pupils round.   Ears:  bilat tm's red, sinus nontender Nose:  nasal dischargemucosal pallor and mucosal edema.   Mouth:  pharyngeal erythema and fair dentition.   Neck:  supple and cervical lymphadenopathy.   Lungs:  normal respiratory effort and normal breath sounds.   Heart:  normal rate and regular rhythm.     Extremities:  no edema, no erythema  Skin:  no rashes.   Psych:  not depressed appearing and moderately anxious.     Impression & Recommendations:  Problem # 1:  OTITIS MEDIA, BILATERAL (ICD-382.9)  Her updated medication list for this problem includes:    Cephalexin 500 Mg Caps (Cephalexin) .Marland Kitchen... 1p o three times a day treat as above, f/u any worsening signs or symptoms   Problem # 2:  ANXIETY (ICD-300.00) mild to mod, declines further tx todya such as SSRI  Problem # 3:  HYPERTENSION (ICD-401.9)  Her updated medication list for this problem includes:    Hydrochlorothiazide 12.5 Mg Caps (Hydrochlorothiazide) .Marland Kitchen... 1 capsule by mouth once a day    Lisinopril 5 Mg Tabs (Lisinopril) .Marland Kitchen... Take 1 tablet by mouth once a day  BP today: 146/92 Prior BP: 118/86 (01/14/2010)  Labs Reviewed: K+: 4.3 (12/29/2009) Creat: : 0.7 (12/29/2009)   Chol: 179 (12/29/2009)   HDL: 40.70 (12/29/2009)   LDL: 131 (12/29/2009)   TG: 38.0 (12/29/2009) mild elev today, likely situational, ok to follow, continue same treatment   Problem # 4:  DEPRESSION (ICD-311)  stable overall by hx and exam, ok to continue meds/tx as is   Complete Medication List: 1)  Hydrochlorothiazide 12.5 Mg Caps (Hydrochlorothiazide) .Marland Kitchen.. 1 capsule by mouth once a day 2)  Lisinopril 5 Mg Tabs (Lisinopril) .... Take 1 tablet by mouth once a day 3)  Omeprazole 40 Mg Cpdr (Omeprazole) .... One each night at bedtime 4)  Veramyst 27.5 Mcg/spray Susp (Fluticasone furoate) .... Two puffs each nostril daily 5)  Cephalexin 500 Mg Caps (Cephalexin) .Marland Kitchen.. 1p o three times a day 6)  Citalopram Hydrobromide 10 Mg Tabs (Citalopram hydrobromide) .Marland Kitchen.. 1po once daily  Patient Instructions: 1)  Please take all new medications as prescribed 2)  Continue all previous medications as before this visit  3)  You are given the work note today 4)  Please schedule a follow-up appointment as needed. 5)  Check your Blood Pressure regularly. If it is  above 140/90 on a regular basis: you should make an appointment. Prescriptions: CEPHALEXIN 500 MG CAPS (CEPHALEXIN) 1p o three times a day  #30 x 0   Entered and Authorized by:   Corwin Levins MD   Signed by:   Corwin Levins MD on 03/17/2010   Method used:   Print then Give to Patient   RxID:   [id number]

## 2010-08-20 NOTE — Consult Note (Signed)
Summary: [facility]  [facility]   Imported By: Sherian Rein 01/22/2010 13:55:34  _____________________________________________________________________  External Attachment:    Type:   Image     Comment:   External Document

## 2010-08-20 NOTE — Progress Notes (Signed)
Summary: Rx Anxiety  Phone Note Call from Patient Call back at Home Phone [phone]   Caller: Patient Summary of Call: Pt called stating that she declined treatment of anxiety at OV yesterday but has since changed her mind. Pt is requesting Rx to [facility] Dr Initial call taken by: Margaret Pyle, CMA,  March 18, 2010 8:21 AM  Follow-up for Phone Call        ok for citalopram 10 mg per day; please call if not helpful in 30 days, as this can be increased to 20 mg Follow-up by: Corwin Levins MD,  March 18, 2010 12:57 PM  Additional Follow-up for Phone Call Additional follow up Details #1::        Pt informed and will monitor sxs Additional Follow-up by: Margaret Pyle, CMA,  March 18, 2010 1:46 PM    New/Updated Medications: CITALOPRAM HYDROBROMIDE 10 MG TABS (CITALOPRAM HYDROBROMIDE) 1po once daily Prescriptions: CITALOPRAM HYDROBROMIDE 10 MG TABS (CITALOPRAM HYDROBROMIDE) 1po once daily  #90 x 3   Entered and Authorized by:   Corwin Levins MD   Signed by:   Corwin Levins MD on 03/18/2010   Method used:   Electronically to        [facility] (retail)       [address]       [place], [place]  [address]       Ph: [phone]       Fax: [phone]   RxID:   [id number]

## 2010-08-20 NOTE — Progress Notes (Signed)
Summary: dry mouth?  Phone Note Call from Patient   Caller: Patient [phone] Summary of Call: pt called reporting dry mouth. Pt is requesting Rx treatment. I will advise pt that CPX due in 1 month. Initial call taken by: Margaret Pyle, CMA,  Nov 27, 2009 4:06 PM  Follow-up for Phone Call        sorry, but there is no good tx for this; will need to assess at her next visit Follow-up by: Corwin Levins MD,  Nov 27, 2009 5:11 PM  Additional Follow-up for Phone Call Additional follow up Details #1::        Home number is disconnected. Called work number and they stated they do not accept personnel phone call. Will close phone note until pt calls back. Additional Follow-up by: Josph Macho RMA,  Nov 28, 2009 8:48 AM

## 2010-08-20 NOTE — Letter (Signed)
Summary: Out of Work  [facility]  [address] [place], [place] [address]   Phone: [phone]  Fax: [phone]    March 17, 2010   Employee:  [name]    To Whom It May Concern:   For Medical reasons, please excuse the above named employee from work for the following dates:  Start:   03/17/2010  End:   03/18/2010  If you need additional information, please feel free to contact our office.         Sincerely,    Dr. Oliver Barre

## 2010-09-22 ENCOUNTER — Ambulatory Visit (INDEPENDENT_AMBULATORY_CARE_PROVIDER_SITE_OTHER): Payer: BC Managed Care – PPO | Admitting: Endocrinology

## 2010-09-22 ENCOUNTER — Encounter: Payer: Self-pay | Admitting: Endocrinology

## 2010-09-22 DIAGNOSIS — J069 Acute upper respiratory infection, unspecified: Secondary | ICD-10-CM

## 2010-09-23 ENCOUNTER — Encounter: Payer: Self-pay | Admitting: Endocrinology

## 2010-09-23 ENCOUNTER — Telehealth: Payer: Self-pay | Admitting: Internal Medicine

## 2010-09-29 LAB — POCT CARDIAC MARKERS
CKMB, poc: <1 ng/mL — ABNORMAL LOW (ref 1.0–8.0)
Myoglobin, poc: 30.2 ng/mL (ref 12–200)
Myoglobin, poc: 30.3 ng/mL (ref 12–200)
Troponin i, poc: <0.05 ng/mL (ref 0.00–0.09)

## 2010-09-29 LAB — URINE MICROSCOPIC-ADD ON

## 2010-09-29 LAB — BASIC METABOLIC PANEL
CO2: 30 mEq/L (ref 19–32)
Chloride: 102 mEq/L (ref 96–112)
GFR calc Af Amer: >60 mL/min (ref >60)
Potassium: 4.3 mEq/L (ref 3.5–5.1)
Sodium: 136 mEq/L (ref 135–145)

## 2010-09-29 LAB — POCT I-STAT, CHEM 8
BUN: 7 mg/dL (ref 6–23)
Chloride: 104 mEq/L (ref 96–112)
Potassium: 3.4 mEq/L — ABNORMAL LOW (ref 3.5–5.1)
Sodium: 139 mEq/L (ref 135–145)

## 2010-09-29 LAB — URINALYSIS, ROUTINE W REFLEX MICROSCOPIC
Bilirubin Urine: NEGATIVE
Ketones, ur: NEGATIVE mg/dL
Nitrite: NEGATIVE
Urobilinogen, UA: 0.2 mg/dL (ref 0.0–1.0)
pH: 6.5 (ref 5.0–8.0)

## 2010-09-29 LAB — POCT PREGNANCY, URINE: Preg Test, Ur: NEGATIVE

## 2010-09-29 LAB — D-DIMER, QUANTITATIVE: D-Dimer, Quant: <0.22 ug/mL-FEU (ref 0.00–0.48)

## 2010-09-29 NOTE — Progress Notes (Signed)
Summary: Letter addendum request  Phone Note Call from Patient   Caller: Patient Call For: Dr. Everardo All Summary of Call: Pt was seen on Tuesday 09/22/2010 and has a note in the system for being out of work Tues. 09/22/10 & Wed. 09/23/10. Pt is requesting if you could please addend the note to include Mon. 09/21/2010 as well to cover the entire period of illness and/or being out of work this week (to return to week Thurs. 09/24/10). Initial call taken by: Burnard Leigh [facility]),  September 23, 2010 10:00 AM  Follow-up for Phone Call        done Follow-up by: Minus Breeding MD,  September 23, 2010 11:45 AM  Additional Follow-up for Phone Call Additional follow up Details #1::        Called Pt and LMOM that letter is ready for PU. Additional Follow-up by: Burnard Leigh [facility]),  September 24, 2010 8:28 AM

## 2010-09-29 NOTE — Letter (Signed)
Summary: Out of Work  [facility]  [address] [place], [place] [address]   Phone: [phone]  Fax: [phone]    September 22, 2010   Employee:  [name]    To Whom It May Concern:   For Medical reasons, please excuse the above named employee from work for the following dates:  Start:   09/22/10  End:   09/23/10        Sincerely,    Minus Breeding MD

## 2010-09-29 NOTE — Assessment & Plan Note (Signed)
Summary: DR JWJ PT NO SLOT-COUGH-SCRATCHY THROAT-STUFFY NOSE--STC   Vital Signs:  Patient profile:   45 year old female Height:      61.5 inches (156.21 cm) Weight:      154.50 pounds (70.23 kg) BMI:     28.82 O2 Sat:      98 % on Room air Temp:     98.7 degrees F (37.06 degrees C) oral Pulse rate:   88 / minute Pulse rhythm:   regular BP sitting:   118 / 86  (left arm) Cuff size:   regular  Vitals Entered By: Brenton Grills CMA Duncan Dull) (September 22, 2010 9:46 AM)  O2 Flow:  Room air CC: Sinus infection?/aj Is Patient Diabetic? No Comments pt is no longer using Proair   Referring Provider:  Oliver Barre Primary Provider:  Oliver Barre, MD  CC:  Sinus infection?/aj.  History of Present Illness: pt states 2 days of slight nasal congestion, dry cough, sore throat, and "post-nasal drip."  Current Medications (verified): 1)  Hydrochlorothiazide 12.5 Mg Caps (Hydrochlorothiazide) .Marland Kitchen.. 1 Capsule By Mouth Once A Day 2)  Lisinopril 5 Mg Tabs (Lisinopril) .... Take 1 Tablet By Mouth Once A Day 3)  Omeprazole 40 Mg Cpdr (Omeprazole) .... One Each Night At Bedtime 4)  Veramyst 27.5 Mcg/spray  Susp (Fluticasone Furoate) .... Two Puffs Each Nostril Daily 5)  Azithromycin 250 Mg Tabs (Azithromycin) .... 2po Qd For 1 Day, Then 1po Qd For 4days, Then Stop 6)  Citalopram Hydrobromide 10 Mg Tabs (Citalopram Hydrobromide) .Marland Kitchen.. 1po Once Daily 7)  Hydrocodone-Homatropine 5-1.5 Mg/73ml Syrp (Hydrocodone-Homatropine) .Marland Kitchen.. 1 Tsp Ppo Q 6 Hrs As Needed Cough 8)  Proair Hfa 108 (90 Base) Mcg/act Aers (Albuterol Sulfate) .... 2 Puffs Qid As Needed  Allergies (verified): No Known Drug Allergies  Past History:  Past Medical History: Last updated: 12/23/2008 Anxiety Depression Hyperlipidemia Hypertension Obesity Allergic rhinitis GERD Hyperplastic colon polyp 10/2003 IBS RLS Menorrhagia  Review of Systems  The patient denies fever.         no earache  Physical Exam  General:  Well  developed, well nourished, in no acute distress.  Head:  head: no deformity eyes: no periorbital swelling, no proptosis external nose and ears are normal mouth: no lesion seen Ears:  TM's intact and clear with normal canals with grossly normal hearing.   Neck:  supple Lungs:  Clear to auscultation bilaterally. Normal respiratory effort.    Impression & Recommendations:  Problem # 1:  URI (ICD-465.9) Assessment New  Medications Added to Medication List This Visit: 1)  Fluconazole 150 Mg Tabs (Fluconazole) .Marland Kitchen.. 1 tab once daily 2)  Cefuroxime Axetil 250 Mg Tabs (Cefuroxime axetil) .Marland Kitchen.. 1 tab two times a day  Other Orders: Est. Patient Level III (98119)  Patient Instructions: 1)  here is a refill of your cough syrup 2)  cefuroxime 250 mg two times a day 3)  fluconazole 100 mg once daily, x 3 days 4)  take loratadine (non-prescription) as needed for "post-nasal drip." Prescriptions: CEFUROXIME AXETIL 250 MG TABS (CEFUROXIME AXETIL) 1 tab two times a day  #14 x 0   Entered and Authorized by:   Minus Breeding MD   Signed by:   Minus Breeding MD on 09/22/2010   Method used:   Electronically to        [facility] (retail)       [address]       [place]  [place], [place]  [address]       Ph: [phone]       Fax: [phone]   RxID:   [id number] FLUCONAZOLE 150 MG TABS (FLUCONAZOLE) 1 tab once daily  #3 x 1   Entered and Authorized by:   Minus Breeding MD   Signed by:   Minus Breeding MD on 09/22/2010   Method used:   Electronically to        [facility] (retail)       [address]       [place], [place]  [address]       Ph: [phone]       Fax: [phone]   RxID:   [id number] HYDROCODONE-HOMATROPINE 5-1.5 MG/5ML SYRP (HYDROCODONE-HOMATROPINE) 1 tsp ppo q 6 hrs as needed cough  #6 oz x 1   Entered and Authorized by:   Minus Breeding MD   Signed by:   Minus Breeding MD on 09/22/2010   Method used:    Print then Give to Patient   RxID:   [id number]    Orders Added: 1)  Est. Patient Level III [39767]

## 2010-09-29 NOTE — Letter (Signed)
Summary: Out of Work  [facility]  [address] [place], [place] [address]   Phone: [phone]  Fax: [phone]    September 23, 2010   Employee:  AASHIKA CARTA Ceesay    To Whom It May Concern:   For Medical reasons, please excuse the above named employee from work for the following dates:  Start:   09/21/10  End:   09/24/10        Sincerely,    Minus Breeding MD

## 2010-10-12 ENCOUNTER — Ambulatory Visit (INDEPENDENT_AMBULATORY_CARE_PROVIDER_SITE_OTHER): Payer: BC Managed Care – PPO | Admitting: Internal Medicine

## 2010-10-12 ENCOUNTER — Encounter: Payer: Self-pay | Admitting: Internal Medicine

## 2010-10-12 ENCOUNTER — Ambulatory Visit: Payer: BC Managed Care – PPO | Admitting: Internal Medicine

## 2010-10-12 DIAGNOSIS — I10 Essential (primary) hypertension: Secondary | ICD-10-CM

## 2010-10-12 DIAGNOSIS — J309 Allergic rhinitis, unspecified: Secondary | ICD-10-CM

## 2010-10-12 DIAGNOSIS — R05 Cough: Secondary | ICD-10-CM | POA: Insufficient documentation

## 2010-10-12 DIAGNOSIS — R051 Acute cough: Secondary | ICD-10-CM | POA: Insufficient documentation

## 2010-10-12 DIAGNOSIS — Z0000 Encounter for general adult medical examination without abnormal findings: Secondary | ICD-10-CM

## 2010-10-12 DIAGNOSIS — E785 Hyperlipidemia, unspecified: Secondary | ICD-10-CM

## 2010-10-12 DIAGNOSIS — Z0001 Encounter for general adult medical examination with abnormal findings: Secondary | ICD-10-CM | POA: Insufficient documentation

## 2010-10-12 DIAGNOSIS — R059 Cough, unspecified: Secondary | ICD-10-CM

## 2010-10-12 MED ORDER — LOSARTAN POTASSIUM 25 MG PO TABS: 25.0000 mg | ORAL_TABLET | Freq: Every day | ORAL | Status: DC

## 2010-10-12 MED ORDER — FLUTICASONE PROPIONATE 50 MCG/ACT NA SUSP: 2.0000 | Freq: Every day | NASAL | Status: DC

## 2010-10-12 MED ORDER — FEXOFENADINE HCL 180 MG PO TABS: 180.0000 mg | ORAL_TABLET | Freq: Every day | ORAL | Status: DC

## 2010-10-12 NOTE — Assessment & Plan Note (Signed)
Uncontrolled,  Take all new medications as prescribed, f/u any worsening symptoms

## 2010-10-12 NOTE — Patient Instructions (Addendum)
Stop the lisinopril Take all new medications as prescribed - the allergy medications, and the new BP medication (losartan) - all sent to the pharmacy Continue all other medications as before Please return in 3 mo with  CPX labs prior

## 2010-10-12 NOTE — Progress Notes (Signed)
Subjective:    Patient ID: [name], female    DOB: October 05, 1965, 45 y.o.   MRN: [id number]  HPI  Here to f/u with cough, ongoing since nov 2011, constant, persistent, worse at night to lie down, though initially to e related to sinus congesiton and post nasal gtt, some better and stopped actually only with taking the hydrocodone cough syrup peer Dr Lorane Gell last visit; does still have some sinus congestion, better now in the past wk or so but still coughing; cough is nonprod for the most part, occasially with clear sputum, sometimes hard cough to the point she will gag and throw up.  No earache, fever, ST, Pt denies chest pain, increased sob or doe, wheezing, orthopnea, PND, increased LE swelling, palpitations, dizziness or syncope.    Pt denies new neurological symptoms such as new headache, or facial or extremity weakness or numbness.   Pt denies polydipsia, polyuria.   Has been on the ACE for about 6 yrs without significant cough, seemed to start with celexa in her mind.  Overall good compliance with treatment, and good medicine tolerability.   Pt denies fever, wt loss, night sweats, loss of appetite, or other constitutional symptoms   Last CXR nov 2011 - neg per Debera Lat  Past Medical History  Diagnosis Date  . Anxiety   . Depression   . Hyperlipidemia   . Hypertension   . Obesity   . Allergic rhinitis   . GERD (gastroesophageal reflux disease)   . Hyperplastic colon polyp 10/2003  . IBS (irritable bowel syndrome)   . RLS (restless legs syndrome)   . Menorrhagia   . ALLERGIC RHINITIS 03/26/2007  . ANXIETY 03/26/2007  . COLONIC POLYPS, HX OF 03/26/2007  . DEPRESSION 03/26/2007  . GERD 03/26/2007  . HYPERLIPIDEMIA 03/26/2007  . HYPERSOMNIA 07/16/2008  . HYPERTENSION 03/26/2007  . IBS 03/26/2007   Past Surgical History  Procedure Date  . Tubal ligation     reports that she has never smoked. She does not have any smokeless tobacco history on file. She reports that she drinks alcohol. She reports  that she does not use illicit drugs. family history includes Cancer in her mother; Colon polyps in her father; Heart attack in her other; Hyperlipidemia in her other; and Hypertension in her other. No Known Allergies  Current Outpatient Prescriptions on File Prior to Visit  Medication Sig Dispense Refill  . citalopram (CELEXA) 10 MG tablet Take 10 mg by mouth daily.        . hydrochlorothiazide (,MICROZIDE/HYDRODIURIL,) 12.5 MG capsule Take 12.5 mg by mouth daily.        Marland Kitchen omeprazole (PRILOSEC) 40 MG capsule Take 40 mg by mouth. One each night at bedtime       . albuterol (PROAIR HFA) 108 (90 BASE) MCG/ACT inhaler Inhale 2 puffs into the lungs 4 (four) times daily.        . cefUROXime (CEFTIN) 250 MG tablet Take 250 mg by mouth 2 (two) times daily.        . fluconazole (DIFLUCAN) 150 MG tablet Take 150 mg by mouth once.        . fluticasone (VERAMYST) 27.5 MCG/SPRAY nasal spray 2 sprays by Nasal route daily.        Marland Kitchen HYDROcodone-homatropine (HYCODAN) 5-1.5 MG/5ML syrup Take by mouth every 6 (six) hours as needed. For cough          Review of Systems Review of Systems  Constitutional: Negative for diaphoresis and unexpected weight change.  HENT: Negative for drooling and tinnitus.   Eyes: Negative for photophobia and visual disturbance.  Respiratory: Negative for choking and stridor.   Gastrointestinal: Negative for vomiting and blood in stool.  Genitourinary: Negative for hematuria and decreased urine volume.  Musculoskeletal: Negative for gait problem.  Skin: Negative for color change and wound.  Neurological: Negative for tremors and numbness.  Psychiatric/Behavioral: Negative for decreased concentration. The patient is not hyperactive.  but does have some stressors at home.      Objective:   Physical Exam Physical Exam  Constitutional: Pt appears well-developed and well-nourished.  HENT: Head: Normocephalic.  Right Ear: External ear normal.  Left Ear: External ear normal.    Nasal congestion noted, and pharyngeal cobblestoning appearance Eyes: Conjunctivae and EOM are normal. Pupils are equal, round, and reactive to light.  Neck: Normal range of motion. Neck supple.  Cardiovascular: Normal rate and regular rhythm.   Pulmonary/Chest: Effort normal and breath sounds normal.  Abd:  Soft, NT, non-distended, + BS Neurological: Pt is alert. No cranial nerve deficit.  Skin: Skin is warm. No erythema.  Psychiatric: Pt behavior is normal. Thought content normal.         Assessment & Plan:

## 2010-10-12 NOTE — Assessment & Plan Note (Signed)
Etiology unclear, suspect more likely related to allergic rhinitis and post nasal gtt, but cant r/o ACE related as well; declines repeat CXR today;  Tx as per below; f/u any persistent symptoms, consider ENT or pulm if persists or worsens

## 2010-10-13 ENCOUNTER — Ambulatory Visit: Payer: BC Managed Care – PPO | Admitting: Internal Medicine

## 2010-10-14 ENCOUNTER — Encounter: Payer: Self-pay | Admitting: Internal Medicine

## 2010-10-14 NOTE — Assessment & Plan Note (Signed)
stable overall by hx and exam, ok to continue meds/tx as is,  Cant r/o ACE as cause for cough, to d/c the ace, and start ARB;  Most recent labs reviewed with pt  Lab Results  Component Value Date   WBC 6.5 12/29/2009   HGB 12.2 06/29/2010   HCT 38.0 05/27/2010   PLT 202.0 12/29/2009   CHOL 179 12/29/2009   TRIG 38.0 12/29/2009   HDL 40.70 12/29/2009   ALT 5 12/29/2009   AST 15 12/29/2009   NA 136 06/25/2010   K 4.3 06/25/2010   CL 102 06/25/2010   CREATININE 0.66 06/25/2010   BUN 6 06/25/2010   CO2 30 06/25/2010   TSH 0.55 12/29/2009

## 2010-10-14 NOTE — Assessment & Plan Note (Signed)
stable overall by hx and exam, ok to continue meds/tx as is , most recent lab reviewed with pt, declines change meds for now, to work on better diet  Lab Results  Component Value Date   LDLCALC 131* 12/29/2009

## 2010-10-21 LAB — CULTURE, ROUTINE-ABSCESS

## 2010-12-03 ENCOUNTER — Other Ambulatory Visit: Payer: Self-pay | Admitting: Women's Health

## 2010-12-03 DIAGNOSIS — Z1231 Encounter for screening mammogram for malignant neoplasm of breast: Secondary | ICD-10-CM

## 2010-12-04 NOTE — H&P (Signed)
[name], [name]              ACCOUNT NO.:  [id number]   MEDICAL RECORD NO.:  [id number]          PATIENT TYPE:  AMB   LOCATION:  [facility]                           FACILITY:  [facility]   PHYSICIAN:  Gerald Leitz, MD          DATE OF BIRTH:  [birth year]   DATE OF ADMISSION:  02/04/2006  DATE OF DISCHARGE:                                HISTORY & PHYSICAL   HISTORY OF PRESENT ILLNESS:  This is a 45 year old G2, P2 with a history of  menorrhagia.  She has tried oral contraceptives in the past and had  irregular bleeding with this, does not desire to try any further medical  management, she is interested in endometrial ablation.  The patient had an  ultrasound on Nov 20, 2004 that did show some submucosal fibroids less than 2  cm in size.   PAST MEDICAL HISTORY:  Hypertension.   PAST SURGICAL HISTORY:  Bilateral tubal ligation.   MEDICATION:  Lisinopril 20/12.5 one p.o. daily.   ALLERGIES:  NO KNOWN DRUG ALLERGIES.   SOCIAL HISTORY:  The patient is divorced, denies tobacco, alcohol, or  illicit drug use.   FAMILY HISTORY:  Cancer in her mother.   PAST GYNECOLOGIC HISTORY:  No history of sexually transmitted diseases.  No  history of abnormal Pap smear.  Last Pap smear was in July 2007 and this was  normal.   REVIEW OF SYSTEMS:  Negative except as stated in history of present illness.   PHYSICAL EXAMINATION:  VITAL SIGNS:  Blood pressure 122/76, weight 152  pounds, height 5 feet 1-1/2 inches.  CARDIOVASCULAR:  Regular rate and rhythm.  LUNGS:  Clear to auscultation bilaterally.  ABDOMEN:  Soft, nontender, nondistended.  No masses appreciated.  PELVIC EXAM:  Normal external female genitalia.  No vulva, vaginal or  cervical lesions are noted.  Bimanual exam reveals approximately 10 week  size uterus.  No adnexal masses or tenderness.   IMPRESSION AND PLAN:  Menorrhagia secondary to submucosal fibroids.  The  patient desires management with ThermaChoice endometrial ablation.  Risks,  benefits and alternatives of the procedure were discussed with the  patient including but not limited to infection, bleeding, uterine  perforation, need for further surgery, possible transfusion with its  associated risks of human immunodeficiency virus and hepatitis B and C were  all discussed.  The patient understands risks and desires to proceed with  ThermaChoice endometrial ablation.      Gerald Leitz, MD  Electronically Signed     TC/MEDQ  D:  02/02/2006  T:  02/02/2006  Job:  [id number]

## 2010-12-04 NOTE — Op Note (Signed)
NAMEJENESYS, CASSEUS              ACCOUNT NO.:  [id number]   MEDICAL RECORD NO.:  [id number]          PATIENT TYPE:  AMB   LOCATION:  SDC                           FACILITY:  [facility]   PHYSICIAN:  Gerald Leitz, MD          DATE OF BIRTH:  [birth year]   DATE OF PROCEDURE:  02/04/2006  DATE OF DISCHARGE:                                 OPERATIVE REPORT   PREOPERATIVE DIAGNOSES:  1. Menorrhagia.  2. Symptomatic uterine fibroids.   POSTOPERATIVE DIAGNOSES:  1. Menorrhagia.  2. Symptomatic uterine fibroids.   PROCEDURE:  Thermachoice endometrial ablation.   SURGEON:  Gerald Leitz, M.D.   ASSISTANT:  None.   ANESTHESIA:  MAC.   SPECIMEN:  None.   ESTIMATED BLOOD LOSS:  Minimal.   COMPLICATIONS:  None.   INDICATION:  A 45 year old with submucosal fibroids and menorrhagia, who  desired endometrial ablation.   PROCEDURE:  The patient was taken to the operating room, where she was  placed under MAC anesthesia.  She was then prepped and draped in the usual  sterile fashion.  Bivalve speculum was placed into the vaginal vault.  The  anterior lip of the cervix was grasped with a single-tooth tenaculum.  Paracervical block was achieved with 60 mL of 0.25% Marcaine.  The uterus  sounded to 7 cm.  The ThermaChoice ablation apparatus was primed.  It was  then inserted into the uterus to 6.5 cm.  Pressure was achieved to 160 mmHg,  stabilized at this point.  The ThermaChoice ablation was performed for a  full cycle of 8 minutes.  The apparatus was allowed to cool down and it was  then removed.  The balloon was deflated and it was then removed from the  uterine cavity.  The single-tooth tenaculum  was removed.  Silver nitrate was applied at the tenaculum sites.  Excellent  hemostasis was noted.  All other instruments were removed from the patient's  vagina.  Sponge, lap and needle counts were correct x2.  The patient was  awakened from anesthesia and taken to the recovery room in stable  condition.      Gerald Leitz, MD  Electronically Signed     TC/MEDQ  D:  02/04/2006  T:  02/04/2006  Job:  [id number]

## 2010-12-04 NOTE — Procedures (Signed)
[name], DUNSTAN            ACCOUNT NO.:  [id number]   MEDICAL RECORD NO.:  [id number]          PATIENT TYPE:  OUT   LOCATION:  SLEEP CENTER                 FACILITY:  [facility]   PHYSICIAN:  Marcelyn Bruins, M.D. [facility] DATE OF BIRTH:  July 28, 1965   DATE OF ADMISSION:  03/06/2004  DATE OF DISCHARGE:  03/06/2004                              NOCTURNAL POLYSOMNOGRAM   REFERRING PHYSICIAN:  Oliver Barre, M.D.   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  Epworth  sleepiness score is 8.   SLEEP ARCHITECTURE:  The patient had a total sleep time of 266 minutes with  a very poor sleep efficiency of 73%.  There was very little REM and patient  never achieve slow wave sleep.  Sleep latency was prolonged at 43 minutes  and REM onset was prolonged as well.   IMPRESSION:  1.  Small numbers of obstructive events which do not meet the RDI criteria      for the obstructive sleep apnea syndrome.  There were transient O2      desaturations associated with these events.  2.  Mild snoring noted.  3.  No clinically significant cardiac arrhythmia.                                   ______________________________                                Marcelyn Bruins, M.D. [facility]     KC/MEDQ  D:  03/20/2004 10:54:57  T:  03/21/2004 18:09:05  Job:  [id number]

## 2011-01-01 ENCOUNTER — Ambulatory Visit
Admission: RE | Admit: 2011-01-01 | Discharge: 2011-01-01 | Disposition: A | Discharge status: Home / Self Care | Payer: BC Managed Care – PPO | Source: Ambulatory Visit | Attending: Women's Health | Admitting: Women's Health

## 2011-01-01 DIAGNOSIS — Z1231 Encounter for screening mammogram for malignant neoplasm of breast: Secondary | ICD-10-CM

## 2011-01-06 ENCOUNTER — Encounter: Payer: BC Managed Care – PPO | Admitting: Women's Health

## 2011-01-12 ENCOUNTER — Ambulatory Visit (INDEPENDENT_AMBULATORY_CARE_PROVIDER_SITE_OTHER): Payer: BC Managed Care – PPO | Admitting: Internal Medicine

## 2011-01-12 ENCOUNTER — Other Ambulatory Visit: Payer: Self-pay | Admitting: Internal Medicine

## 2011-01-12 ENCOUNTER — Other Ambulatory Visit (INDEPENDENT_AMBULATORY_CARE_PROVIDER_SITE_OTHER): Payer: BC Managed Care – PPO

## 2011-01-12 ENCOUNTER — Encounter: Payer: Self-pay | Admitting: Internal Medicine

## 2011-01-12 VITALS — BP 118/90 | HR 64 | Temp 98.4°F | Ht 62.0 in | Wt 164.0 lb

## 2011-01-12 DIAGNOSIS — Z0000 Encounter for general adult medical examination without abnormal findings: Secondary | ICD-10-CM

## 2011-01-12 DIAGNOSIS — I10 Essential (primary) hypertension: Secondary | ICD-10-CM

## 2011-01-12 LAB — BASIC METABOLIC PANEL
CO2: 29 mEq/L (ref 19–32)
Chloride: 107 mEq/L (ref 96–112)
Potassium: 3.7 mEq/L (ref 3.5–5.1)

## 2011-01-12 LAB — HEPATIC FUNCTION PANEL
ALT: 9 U/L (ref 0–35)
AST: 17 U/L (ref 0–37)
Albumin: 4.6 g/dL (ref 3.5–5.2)
Alkaline Phosphatase: 39 U/L (ref 39–117)

## 2011-01-12 LAB — CBC WITH DIFFERENTIAL/PLATELET
Basophils Absolute: 0 10*3/uL (ref 0.0–0.1)
HCT: 37.7 % (ref 36.0–46.0)
Lymphs Abs: 2 10*3/uL (ref 0.7–4.0)
MCV: 94.7 fl (ref 78.0–100.0)
Monocytes Absolute: 0.6 10*3/uL (ref 0.1–1.0)
Monocytes Relative: 9.4 % (ref 3.0–12.0)
Neutrophils Relative %: 59.5 % (ref 43.0–77.0)
Platelets: 183 10*3/uL (ref 150.0–400.0)
RDW: 14.8 % — ABNORMAL HIGH (ref 11.5–14.6)
WBC: 6.7 10*3/uL (ref 4.5–10.5)

## 2011-01-12 LAB — URINALYSIS, ROUTINE W REFLEX MICROSCOPIC
Bilirubin Urine: NEGATIVE
Ketones, ur: NEGATIVE
Leukocytes, UA: NEGATIVE
Nitrite: NEGATIVE
Urobilinogen, UA: 0.2 (ref 0.0–1.0)

## 2011-01-12 LAB — LIPID PANEL
Cholesterol: 222 mg/dL — ABNORMAL HIGH (ref 0–200)
Total CHOL/HDL Ratio: 4
VLDL: 14.4 mg/dL (ref 0.0–40.0)

## 2011-01-12 LAB — TSH: TSH: 0.79 u[IU]/mL (ref 0.35–5.50)

## 2011-01-12 MED ORDER — HYDROCHLOROTHIAZIDE 12.5 MG PO CAPS: 12.5000 mg | ORAL_CAPSULE | Freq: Every day | ORAL | Status: DC

## 2011-01-12 MED ORDER — LISINOPRIL 5 MG PO TABS: 5.0000 mg | ORAL_TABLET | Freq: Every day | ORAL | Status: DC

## 2011-01-12 MED ORDER — CITALOPRAM HYDROBROMIDE 10 MG PO TABS: ORAL_TABLET | ORAL | Status: DC

## 2011-01-12 NOTE — Assessment & Plan Note (Signed)
To change the losartan back to the ACE , cont to monitor BP at home and next visit

## 2011-01-12 NOTE — Assessment & Plan Note (Signed)

## 2011-01-12 NOTE — Patient Instructions (Addendum)
Stop the losartan Ok to re-start the lisinopril 5 mg for the Blood Pressure Please check your Blood Pressure on a regular basis; your goal is to be < 140/90 Continue all other medications as before, including the allegra for the allergies You are otherwise up to date with prevention today Please go to LAB in the Basement for the blood and/or urine tests to be done today Please call the phone number [phone] (the PhoneTree System) for results of testing in 2-3 days;  When calling, simply dial the number, and when prompted enter the MRN number above (the Medical Record Number) and the # key, then the message should start. Please return in 1 year for your yearly visit, or sooner if needed, with Lab testing done 3-5 days before Please call your pharmacy for any refills you may need during the year

## 2011-01-12 NOTE — Progress Notes (Signed)
Subjective:    Patient ID: [name], female    DOB: [birth year], 45 y.o.   MRN: [id number]  HPI  Here for wellness and f/u;  Overall doing ok;  Pt denies CP, worsening SOB, DOE, wheezing, orthopnea, PND, worsening LE edema, palpitations, dizziness or syncope.  Pt denies neurological change such as new Headache, facial or extremity weakness.  Pt denies polydipsia, polyuria, or low sugar symptoms. Pt states overall good compliance with treatment and medications, good tolerability, and trying to follow lower cholesterol diet.  Pt denies worsening depressive symptoms, suicidal ideation or panic. No fever, wt loss, night sweats, loss of appetite, or other constitutional symptoms.  Pt states good ability with ADL's, low fall risk, home safety reviewed and adequate, no significant changes in hearing or vision, and occasionally active with exercise.   Was seeing psychiatry - now doing well on 30 mg celexa. 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 Does have several wks ongoing nasal allergy symptoms with clear congestion, itch and sneeze, without fever, pain, ST, cough or wheezing.  Has not tried OTC meds.  Also wants to go back to lisinopril 5 mg she had before the losartan 25 mg as seemed to work better for her BP, and cough did not change - has post nasal gtt. Past Medical History  Diagnosis Date  . Anxiety   . Depression   . Hyperlipidemia   . Hypertension   . Obesity   . Allergic rhinitis   . GERD (gastroesophageal reflux disease)   . Hyperplastic colon polyp 10/2003  . IBS (irritable bowel syndrome)   . RLS (restless legs syndrome)   . Menorrhagia   . ALLERGIC RHINITIS 03/26/2007  . ANXIETY 03/26/2007  . COLONIC POLYPS, HX OF 03/26/2007  . DEPRESSION 03/26/2007  . GERD 03/26/2007  . HYPERLIPIDEMIA 03/26/2007  . HYPERSOMNIA 07/16/2008  . HYPERTENSION 03/26/2007  . IBS 03/26/2007   Past Surgical History  Procedure Date  . Tubal ligation     reports that she has never  smoked. She does not have any smokeless tobacco history on file. She reports that she drinks alcohol. She reports that she does not use illicit drugs. family history includes Cancer in her mother; Colon polyps in her father; Heart attack in her other; Hyperlipidemia in her other; and Hypertension in her other. No Known Allergies Current Outpatient Prescriptions on File Prior to Visit  Medication Sig Dispense Refill  . albuterol (PROAIR HFA) 108 (90 BASE) MCG/ACT inhaler Inhale 2 puffs into the lungs 4 (four) times daily.        . fexofenadine (ALLEGRA) 180 MG tablet Take 1 tablet (180 mg total) by mouth daily.  30 tablet  11  . omeprazole (PRILOSEC) 40 MG capsule Take 40 mg by mouth. One each night at bedtime       . DISCONTD: citalopram (CELEXA) 10 MG tablet Take 10 mg by mouth daily.        Marland Kitchen DISCONTD: fluconazole (DIFLUCAN) 150 MG tablet Take 150 mg by mouth once.        Marland Kitchen DISCONTD: hydrochlorothiazide (,MICROZIDE/HYDRODIURIL,) 12.5 MG capsule Take 12.5 mg by mouth daily.        . fluticasone (FLONASE) 50 MCG/ACT nasal spray 2 sprays by Nasal route daily.  16 g  2  . DISCONTD: cefUROXime (CEFTIN) 250 MG tablet Take 250 mg by mouth 2 (two) times daily.        Marland Kitchen DISCONTD: fluticasone (VERAMYST) 27.5 MCG/SPRAY nasal  spray 2 sprays by Nasal route daily.        Marland Kitchen DISCONTD: HYDROcodone-homatropine (HYCODAN) 5-1.5 MG/5ML syrup Take by mouth every 6 (six) hours as needed. For cough       . DISCONTD: losartan (COZAAR) 25 MG tablet Take 1 tablet (25 mg total) by mouth daily.  30 tablet  11   Review of Systems Review of Systems  Constitutional: Negative for diaphoresis, activity change, appetite change and unexpected weight change.  HENT: Negative for hearing loss, ear pain, facial swelling, mouth sores and neck stiffness.   Eyes: Negative for pain, redness and visual disturbance.  Respiratory: Negative for shortness of breath and wheezing.   Cardiovascular: Negative for chest pain and palpitations.    Gastrointestinal: Negative for diarrhea, blood in stool, abdominal distention and rectal pain.  Genitourinary: Negative for hematuria, flank pain and decreased urine volume.  Musculoskeletal: Negative for myalgias and joint swelling.  Skin: Negative for color change and wound.  Neurological: Negative for syncope and numbness.  Hematological: Negative for adenopathy.  Psychiatric/Behavioral: Negative for hallucinations, self-injury, decreased concentration and agitation.      Objective:   Physical Exam BP 118/90  Pulse 64  Temp(Src) 98.4 F (36.9 C) (Oral)  Ht 5\' 2"  (1.575 m)  Wt 164 lb (74.39 kg)  BMI 30.00 kg/m2  SpO2 98%  LMP 11/29/2010 Physical Exam  VS noted Constitutional: Pt is oriented to person, place, and time. Appears well-developed and well-nourished.  HENT:  Head: Normocephalic and atraumatic.  Right Ear: External ear normal.  Left Ear: External ear normal.  Nose: Nose normal.  Mouth/Throat: Oropharynx is clear and moist.  Eyes: Conjunctivae and EOM are normal. Pupils are equal, round, and reactive to light.  Neck: Normal range of motion. Neck supple. No JVD present. No tracheal deviation present.  Cardiovascular: Normal rate, regular rhythm, normal heart sounds and intact distal pulses.   Pulmonary/Chest: Effort normal and breath sounds normal.  Abdominal: Soft. Bowel sounds are normal. There is no tenderness.  Musculoskeletal: Normal range of motion. Exhibits no edema.  Lymphadenopathy:  Has no cervical adenopathy.  Neurological: Pt is alert and oriented to person, place, and time. Pt has normal reflexes. No cranial nerve deficit.  Skin: Skin is warm and dry. No rash noted.  Psychiatric:  . Behavior is normal. mild depressed affect, 1+ nervous       Assessment & Plan:

## 2011-03-16 ENCOUNTER — Other Ambulatory Visit: Payer: Self-pay | Admitting: Pulmonary Disease

## 2011-09-03 ENCOUNTER — Encounter: Payer: Self-pay | Admitting: Internal Medicine

## 2011-09-03 ENCOUNTER — Encounter: Payer: Self-pay | Admitting: *Deleted

## 2011-09-03 ENCOUNTER — Ambulatory Visit (INDEPENDENT_AMBULATORY_CARE_PROVIDER_SITE_OTHER): Payer: BC Managed Care – PPO | Admitting: Internal Medicine

## 2011-09-03 VITALS — BP 110/86 | HR 73 | Temp 98.5°F | Resp 14 | Wt 166.2 lb

## 2011-09-03 DIAGNOSIS — F3289 Other specified depressive episodes: Secondary | ICD-10-CM

## 2011-09-03 DIAGNOSIS — J019 Acute sinusitis, unspecified: Secondary | ICD-10-CM

## 2011-09-03 DIAGNOSIS — F329 Major depressive disorder, single episode, unspecified: Secondary | ICD-10-CM

## 2011-09-03 DIAGNOSIS — I10 Essential (primary) hypertension: Secondary | ICD-10-CM

## 2011-09-03 MED ORDER — HYDROCODONE-HOMATROPINE 5-1.5 MG/5ML PO SYRP: 5.0000 mL | ORAL_SOLUTION | Freq: Four times a day (QID) | ORAL | Status: AC | PRN

## 2011-09-03 MED ORDER — LEVOFLOXACIN 250 MG PO TABS: 250.0000 mg | ORAL_TABLET | Freq: Every day | ORAL | Status: AC

## 2011-09-03 NOTE — Patient Instructions (Signed)
Take all new medications as prescribed Continue all other medications as before You are given the work note today  

## 2011-09-04 ENCOUNTER — Encounter: Payer: Self-pay | Admitting: Internal Medicine

## 2011-09-04 NOTE — Assessment & Plan Note (Signed)
stable overall by hx and exam, most recent data reviewed with pt, and pt to continue medical treatment as before  Lab Results  Component Value Date   WBC 6.7 01/12/2011   HGB 12.7 01/12/2011   HCT 37.7 01/12/2011   PLT 183.0 01/12/2011   GLUCOSE 87 01/12/2011   CHOL 222* 01/12/2011   TRIG 72.0 01/12/2011   HDL 60.80 01/12/2011   LDLDIRECT 133.0 01/12/2011   LDLCALC 131* 12/29/2009   ALT 9 01/12/2011   AST 17 01/12/2011   NA 142 01/12/2011   K 3.7 01/12/2011   CL 107 01/12/2011   CREATININE 0.7 01/12/2011   BUN 11 01/12/2011   CO2 29 01/12/2011   TSH 0.79 01/12/2011

## 2011-09-04 NOTE — Assessment & Plan Note (Signed)
stable overall by hx and exam, most recent data reviewed with pt, and pt to continue medical treatment as before  BP Readings from Last 3 Encounters:  09/03/11 110/86  01/12/11 118/90  10/12/10 114/80

## 2011-09-04 NOTE — Assessment & Plan Note (Signed)
Mild to mod, for antibx course,  to f/u any worsening symptoms or concerns 

## 2011-09-04 NOTE — Progress Notes (Signed)
Subjective:    Patient ID: [name], female    DOB: [birth year], 46 y.o.   MRN: [id number]  HPI   Here with 5 days acute onset fever, facial pain, pressure, general weakness and malaise, and greenish d/c, with slight ST, but little to no cough and Pt denies chest pain, increased sob or doe, wheezing, orthopnea, PND, increased LE swelling, palpitations, dizziness or syncope.  Pt denies new neurological symptoms such as new headache, or facial or extremity weakness or numbness   Pt denies polydipsia, polyuria.  Denies worsening depressive symptoms, suicidal ideation, or panic, though has ongoing anxiety. Overall good compliance with treatment, and good medicine tolerability.   Pt denies fever, wt loss, night sweats, loss of appetite, or other constitutional symptoms, except for the above Past Medical History  Diagnosis Date  . Anxiety   . Depression   . Hyperlipidemia   . Hypertension   . Obesity   . Allergic rhinitis   . GERD (gastroesophageal reflux disease)   . Hyperplastic colon polyp 10/2003  . IBS (irritable bowel syndrome)   . RLS (restless legs syndrome)   . Menorrhagia   . ALLERGIC RHINITIS 03/26/2007  . ANXIETY 03/26/2007  . COLONIC POLYPS, HX OF 03/26/2007  . DEPRESSION 03/26/2007  . GERD 03/26/2007  . HYPERLIPIDEMIA 03/26/2007  . HYPERSOMNIA 07/16/2008  . HYPERTENSION 03/26/2007  . IBS 03/26/2007   Past Surgical History  Procedure Date  . Tubal ligation     reports that she has never smoked. She does not have any smokeless tobacco history on file. She reports that she drinks alcohol. She reports that she does not use illicit drugs. family history includes Cancer in her mother; Colon polyps in her father; Heart attack in her other; Hyperlipidemia in her other; and Hypertension in her other. No Known Allergies Current Outpatient Prescriptions on File Prior to Visit  Medication Sig Dispense Refill  . albuterol (PROAIR HFA) 108 (90 BASE) MCG/ACT inhaler Inhale 2 puffs into the lungs 4  (four) times daily.        . fexofenadine (ALLEGRA) 180 MG tablet Take 1 tablet (180 mg total) by mouth daily.  30 tablet  11  . fluticasone (FLONASE) 50 MCG/ACT nasal spray 2 sprays by Nasal route daily.  16 g  2  . hydrochlorothiazide (,MICROZIDE/HYDRODIURIL,) 12.5 MG capsule Take 1 capsule (12.5 mg total) by mouth daily.  90 capsule  3  . lisinopril (PRINIVIL,ZESTRIL) 5 MG tablet Take 1 tablet (5 mg total) by mouth daily.  90 tablet  3  . omeprazole (PRILOSEC) 40 MG capsule Take 40 mg by mouth. One each night at bedtime       . citalopram (CELEXA) 10 MG tablet 3 tabs by mouth once per day  270 tablet  3   Review of Systems Review of Systems  Constitutional: Negative for diaphoresis and unexpected weight change.  HENT: Negative for drooling and tinnitus.   Eyes: Negative for photophobia and visual disturbance.  Respiratory: Negative for choking and stridor.   Gastrointestinal: Negative for vomiting and blood in stool.  Genitourinary: Negative for hematuria and decreased urine volume.    Objective:   Physical Exam BP 110/86  Pulse 73  Temp(Src) 98.5 F (36.9 C) (Oral)  Resp 14  Wt 166 lb 4 oz (75.411 kg)  SpO2 99%  LMP 08/30/2011 Physical Exam  VS noted Constitutional: Pt appears well-developed and well-nourished.  HENT: Head: Normocephalic.  Right Ear: External ear normal.  Left Ear: External ear normal.  Bilat  tm's mild erythema.  Sinus tender bilat.  Pharynx mild erythema Eyes: Conjunctivae and EOM are normal. Pupils are equal, round, and reactive to light.  Neck: Normal range of motion. Neck supple.  Cardiovascular: Normal rate and regular rhythm.   Pulmonary/Chest: Effort normal and breath sounds normal.  Neurological: Pt is alert. No cranial nerve deficit.  Skin: Skin is warm. No erythema.  Psychiatric: Pt behavior is normal. Thought content normal. 1+ nervous, not depressed affect    Assessment & Plan:

## 2011-09-27 ENCOUNTER — Encounter: Payer: Self-pay | Admitting: Internal Medicine

## 2011-09-27 ENCOUNTER — Ambulatory Visit (INDEPENDENT_AMBULATORY_CARE_PROVIDER_SITE_OTHER): Payer: BC Managed Care – PPO | Admitting: Internal Medicine

## 2011-09-27 VITALS — BP 120/90 | HR 99 | Temp 98.7°F | Ht 61.0 in | Wt 171.1 lb

## 2011-09-27 DIAGNOSIS — F329 Major depressive disorder, single episode, unspecified: Secondary | ICD-10-CM

## 2011-09-27 DIAGNOSIS — J309 Allergic rhinitis, unspecified: Secondary | ICD-10-CM

## 2011-09-27 DIAGNOSIS — M545 Low back pain: Secondary | ICD-10-CM

## 2011-09-27 DIAGNOSIS — I10 Essential (primary) hypertension: Secondary | ICD-10-CM

## 2011-09-27 MED ORDER — PREDNISONE 10 MG PO TABS: ORAL_TABLET | ORAL | Status: DC

## 2011-09-27 MED ORDER — OXYCODONE-ACETAMINOPHEN 5-325 MG PO TABS: 1.0000 | ORAL_TABLET | Freq: Four times a day (QID) | ORAL | Status: AC | PRN

## 2011-09-27 MED ORDER — CYCLOBENZAPRINE HCL 5 MG PO TABS: 5.0000 mg | ORAL_TABLET | Freq: Three times a day (TID) | ORAL | Status: AC | PRN

## 2011-09-27 NOTE — Patient Instructions (Signed)
Take all new medications as prescribed Continue all other medications as before You are given the work note today Please return if not improved in 2-4 weeks

## 2011-09-27 NOTE — Assessment & Plan Note (Signed)
Mod to sever pain, c/w lumbar strain, cant r/o arthritic flare or sciatica, for pain control, muscle relaxer, low dose prednisone trial,  to f/u any worsening symptoms or concerns, no need PT, imaging or surgical eval at this time, but should consider if persists or worsens

## 2011-09-27 NOTE — Assessment & Plan Note (Signed)
Ok for OTC allegra prn ,  to f/u any worsening symptoms or concerns  

## 2011-09-27 NOTE — Assessment & Plan Note (Signed)
stable overall by hx and exam, most recent data reviewed with pt, and pt to continue medical treatment as before BP Readings from Last 3 Encounters:  09/27/11 120/90  09/03/11 110/86  01/12/11 118/90

## 2011-09-27 NOTE — Assessment & Plan Note (Signed)
stable overall by hx and exam, most recent data reviewed with pt, and pt to continue medical treatment as before  Lab Results  Component Value Date   WBC 6.7 01/12/2011   HGB 12.7 01/12/2011   HCT 37.7 01/12/2011   PLT 183.0 01/12/2011   GLUCOSE 87 01/12/2011   CHOL 222* 01/12/2011   TRIG 72.0 01/12/2011   HDL 60.80 01/12/2011   LDLDIRECT 133.0 01/12/2011   LDLCALC 131* 12/29/2009   ALT 9 01/12/2011   AST 17 01/12/2011   NA 142 01/12/2011   K 3.7 01/12/2011   CL 107 01/12/2011   CREATININE 0.7 01/12/2011   BUN 11 01/12/2011   CO2 29 01/12/2011   TSH 0.79 01/12/2011    

## 2011-09-27 NOTE — Progress Notes (Signed)
Subjective:    Patient ID: [name], female    DOB: Apr 11, 1966, 46 y.o.   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  Pain worse to twist, turn at the wiast, standing up or standing too long more than a few minutes, better to lie flat.  Started the AM of the day after working out at the gym with treadmill and wt lifting.  No longer taking the celexa and allergy meds.  Denies worsening depressive symptoms, suicidal ideation, or panic. Does have several wks ongoing nasal allergy symptoms with clear congestion, itch and sneeze, without fever, pain, ST, cough or wheezing. Denies worsening reflux, dysphagia, abd pain, n/v, bowel change or blood, no longer taking the prilosec.   Past Medical History  Diagnosis Date  . Anxiety   . Depression   . Hyperlipidemia   . Hypertension   . Obesity   . Allergic rhinitis   . GERD (gastroesophageal reflux disease)   . Hyperplastic colon polyp 10/2003  . IBS (irritable bowel syndrome)   . RLS (restless legs syndrome)   . Menorrhagia   . ALLERGIC RHINITIS 03/26/2007  . ANXIETY 03/26/2007  . COLONIC POLYPS, HX OF 03/26/2007  . DEPRESSION 03/26/2007  . GERD 03/26/2007  . HYPERLIPIDEMIA 03/26/2007  . HYPERSOMNIA 07/16/2008  . HYPERTENSION 03/26/2007  . IBS 03/26/2007   Past Surgical History  Procedure Date  . Tubal ligation     reports that she has never smoked. She does not have any smokeless tobacco history on file. She reports that she drinks alcohol. She reports that she does not use illicit drugs. family history includes Cancer in her mother; Colon polyps in her father; Heart attack in her other; Hyperlipidemia  in her other; and Hypertension in her other. No Known Allergies Current Outpatient Prescriptions on File Prior to Visit  Medication Sig Dispense Refill  . albuterol (PROAIR HFA) 108 (90 BASE) MCG/ACT inhaler Inhale 2 puffs into the lungs 4 (four) times daily.        . hydrochlorothiazide (,MICROZIDE/HYDRODIURIL,) 12.5 MG capsule Take 1 capsule (12.5 mg total) by mouth daily.  90 capsule  3  . lisinopril (PRINIVIL,ZESTRIL) 5 MG tablet Take 1 tablet (5 mg total) by mouth daily.  90 tablet  3  . citalopram (CELEXA) 10 MG tablet 3 tabs by mouth once per day  270 tablet  3  . fexofenadine (ALLEGRA) 180 MG tablet Take 1 tablet (180 mg total) by mouth daily.  30 tablet  11  . fluticasone (FLONASE) 50 MCG/ACT nasal spray 2 sprays by Nasal route daily.  16 g  2  . omeprazole (PRILOSEC) 40 MG capsule Take 40 mg by mouth. One each night at bedtime        Review of Systems Review of Systems  Constitutional: Negative for diaphoresis and unexpected weight change.  HENT: Negative for drooling and tinnitus.   Eyes: Negative for photophobia and visual disturbance.  Respiratory: Negative for choking and stridor.   Gastrointestinal: Negative for vomiting and blood in stool.  Genitourinary: Negative for hematuria and decreased urine volume.    Objective:   Physical Exam  BP 120/90  Pulse 99  Temp(Src) 98.7 F (37.1 C) (Oral)  Ht 5\' 1"  (1.549 m)  Wt 171 lb 2 oz (77.622 kg)  BMI 32.33 kg/m2  SpO2 99%  LMP 08/30/2011 Physical Exam  VS noted Constitutional: Pt appears well-developed and well-nourished.  HENT: Head: Normocephalic.  Right Ear: External ear normal.  Left Ear: External ear normal.  Bilat tm's mild erythema.  Sinus nontender.  Pharynx mild erythema Eyes: Conjunctivae and EOM are normal. Pupils are equal, round, and reactive to light.  Neck: Normal range of motion. Neck supple.  Cardiovascular: Normal rate and regular rhythm.   Pulmonary/Chest: Effort normal and breath sounds normal.    Abd:  Soft, NT, non-distended, + BS Spine nontender except for mild midline L5 tender, no erythema, swelling.  Has some right buttock tender worse than spine tender, no LE tedner, red, sweling, rash.   Neurological: Pt is alert. No cranial nerve deficit. Motor/dtr/sens intact, neg slr bilat  Skin: Skin is warm. No erythema.  Psychiatric: Pt behavior is normal. Thought content normal. 1+ nervous, not depressed affect    Assessment & Plan:

## 2011-09-28 ENCOUNTER — Telehealth: Payer: Self-pay

## 2011-09-28 DIAGNOSIS — Z0000 Encounter for general adult medical examination without abnormal findings: Secondary | ICD-10-CM

## 2011-09-28 NOTE — Telephone Encounter (Signed)
Put lab order in. 

## 2011-10-25 ENCOUNTER — Ambulatory Visit (INDEPENDENT_AMBULATORY_CARE_PROVIDER_SITE_OTHER): Payer: BC Managed Care – PPO | Admitting: Women's Health

## 2011-10-25 ENCOUNTER — Encounter: Payer: Self-pay | Admitting: Women's Health

## 2011-10-25 ENCOUNTER — Other Ambulatory Visit (HOSPITAL_COMMUNITY)
Admission: RE | Admit: 2011-10-25 | Discharge: 2011-10-25 | Disposition: A | Discharge status: Home / Self Care | Payer: BC Managed Care – PPO | Source: Ambulatory Visit | Attending: Obstetrics and Gynecology | Admitting: Obstetrics and Gynecology

## 2011-10-25 VITALS — BP 128/90 | Ht 62.75 in | Wt 164.0 lb

## 2011-10-25 DIAGNOSIS — Z01419 Encounter for gynecological examination (general) (routine) without abnormal findings: Secondary | ICD-10-CM | POA: Insufficient documentation

## 2011-10-25 DIAGNOSIS — N898 Other specified noninflammatory disorders of vagina: Secondary | ICD-10-CM

## 2011-10-25 LAB — WET PREP FOR TRICH, YEAST, CLUE
Clue Cells Wet Prep HPF POC: NONE SEEN
Trich, Wet Prep: NONE SEEN
Yeast Wet Prep HPF POC: NONE SEEN

## 2011-10-25 MED ORDER — TERCONAZOLE 0.8 % VA CREA: 1.0000 | TOPICAL_CREAM | Freq: Every day | VAGINAL | Status: AC

## 2011-10-25 NOTE — Patient Instructions (Signed)

## 2011-10-25 NOTE — Progress Notes (Signed)
[name] 13-Jun-1966 [id number]    History:    The patient presents for annual exam.  Monthly 3 day cycles/BTL not sexually active. History of uterine ablation in 2007 with good relief of menorrhagia. Hypertension- primary care manages labs and meds. History of normal Paps, last Pap 2010. History of normal mammograms. Mother died of colon cancer, had a colonoscopy with 2 benign polyps in 2010.   Past medical history, past surgical history, family history and social history were all reviewed and documented in the EPIC chart. 2 sons ages 44 and 72 both doing well. 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  Exam:  Filed Vitals:   10/25/11 0920  BP: 128/90    General appearance:  Normal Head/Neck:  Normal, without cervical or supraclavicular adenopathy. Thyroid:  Symmetrical, normal in size, without palpable masses or nodularity. Respiratory  Effort:  Normal  Auscultation:  Clear without wheezing or rhonchi Cardiovascular  Auscultation:  Regular rate, without rubs, murmurs or gallops  Edema/varicosities:  Not grossly evident Abdominal  Soft,nontender, without masses, guarding or rebound.  Liver/spleen:  No organomegaly noted  Hernia:  None appreciated  Skin  Inspection:  Grossly normal  Palpation:  Grossly normal Neurologic/psychiatric  Orientation:  Normal with appropriate conversation.  Mood/affect:  Normal  Genitourinary    Breasts: Examined lying and sitting.     Right: Without masses, retractions, discharge or axillary adenopathy.     Left: Without masses, retractions, discharge or axillary adenopathy.   Inguinal/mons:  Normal without inguinal adenopathy  External genitalia:  Normal  BUS/Urethra/Skene's glands:  Normal  Bladder:  Normal  Vagina:  Normal  Cervix:  Normal  Uterus:   normal in size, shape and contour.  Midline and mobile  Adnexa/parametria:      Rt: Without masses or tenderness.   Lt: Without masses or tenderness.  Anus and perineum: Normal  Digital rectal exam: Normal sphincter tone without palpated masses or tenderness  Assessment/Plan:  46 y.o. DBF G2 P2 for annual exam with complaint of discharge.    External vaginal erythema-yeast like with negative wet prep Hypertension-primary care of labs and meds  Plan: Terazol 3 one applicator HS x3 and apply externally. Instructed to call if no relief of symptoms. Condoms encouraged if become sexually active. SBE's, annual mammogram, increase exercise, decrease calories for weight loss. Encouraged calcium rich foods. Encouraged to continue colon surveillance with every 5 year colonoscopy due to polyp history and family history. Instructed to report elimination changes to gastroenterologist. UA and Pap.    Harrington Challenger [facility], 3:52 PM 10/25/2011

## 2011-10-26 LAB — URINALYSIS W MICROSCOPIC + REFLEX CULTURE
Bilirubin Urine: NEGATIVE
Glucose, UA: NEGATIVE mg/dL
Hgb urine dipstick: NEGATIVE
Protein, ur: NEGATIVE mg/dL
Squamous Epithelial / LPF: NONE SEEN
Urobilinogen, UA: 0.2 mg/dL (ref 0.0–1.0)

## 2011-10-29 ENCOUNTER — Telehealth: Payer: Self-pay

## 2011-10-29 MED ORDER — PROMETHAZINE HCL 25 MG PO TABS: 25.0000 mg | ORAL_TABLET | Freq: Four times a day (QID) | ORAL | Status: DC | PRN

## 2011-10-29 NOTE — Telephone Encounter (Signed)
Pt called requesting an out of work excuse for Wednesday and Thursday. Pt says that she had a stomach virus that caused vomiting and diarrhea, she called [facility] ER for advisement and she says she was told that there wasn't anything that they could do unless she felt that she was dehydrated. Please advise.

## 2011-10-29 NOTE — Telephone Encounter (Signed)
Patient informed of prescriptions sent to pharmacy and OTC to use. Also did note and put in cabnet per pt. Request to pickup on Monday 11/01/11.

## 2011-10-29 NOTE — Telephone Encounter (Signed)
Ok for note \ Ok for phenergan prn - done per emr, ok forimmodium prn

## 2012-01-18 ENCOUNTER — Other Ambulatory Visit (INDEPENDENT_AMBULATORY_CARE_PROVIDER_SITE_OTHER): Payer: BC Managed Care – PPO

## 2012-01-18 ENCOUNTER — Encounter: Payer: Self-pay | Admitting: Internal Medicine

## 2012-01-18 DIAGNOSIS — Z0000 Encounter for general adult medical examination without abnormal findings: Secondary | ICD-10-CM

## 2012-01-18 LAB — URINALYSIS, ROUTINE W REFLEX MICROSCOPIC
Ketones, ur: NEGATIVE
Specific Gravity, Urine: 1.02 (ref 1.000–1.030)
Urine Glucose: NEGATIVE
Urobilinogen, UA: 0.2 (ref 0.0–1.0)
pH: 6 (ref 5.0–8.0)

## 2012-01-18 LAB — HEPATIC FUNCTION PANEL
Albumin: 4.2 g/dL (ref 3.5–5.2)
Total Bilirubin: 0.9 mg/dL (ref 0.3–1.2)

## 2012-01-18 LAB — LIPID PANEL
Cholesterol: 221 mg/dL — ABNORMAL HIGH (ref 0–200)
HDL: 45.7 mg/dL (ref >39.00)
Total CHOL/HDL Ratio: 5
Triglycerides: 96 mg/dL (ref 0.0–149.0)
VLDL: 19.2 mg/dL (ref 0.0–40.0)

## 2012-01-18 LAB — BASIC METABOLIC PANEL
BUN: 11 mg/dL (ref 6–23)
Calcium: 9.3 mg/dL (ref 8.4–10.5)
Creatinine, Ser: 0.8 mg/dL (ref 0.4–1.2)
GFR: 103.39 mL/min (ref >60.00)

## 2012-01-18 LAB — CBC WITH DIFFERENTIAL/PLATELET
Basophils Absolute: 0 10*3/uL (ref 0.0–0.1)
Eosinophils Absolute: 0 10*3/uL (ref 0.0–0.7)
HCT: 37.3 % (ref 36.0–46.0)
Hemoglobin: 12.5 g/dL (ref 12.0–15.0)
Lymphs Abs: 2.2 10*3/uL (ref 0.7–4.0)
MCHC: 33.5 g/dL (ref 30.0–36.0)
Monocytes Relative: 8.7 % (ref 3.0–12.0)
Neutro Abs: 6.2 10*3/uL (ref 1.4–7.7)
Platelets: 203 10*3/uL (ref 150.0–400.0)
RDW: 14.5 % (ref 11.5–14.6)

## 2012-01-18 LAB — LDL CHOLESTEROL, DIRECT: Direct LDL: 153.9 mg/dL

## 2012-01-18 LAB — TSH: TSH: 0.9 u[IU]/mL (ref 0.35–5.50)

## 2012-01-20 ENCOUNTER — Other Ambulatory Visit: Payer: Self-pay | Admitting: Internal Medicine

## 2012-01-24 ENCOUNTER — Encounter: Payer: BC Managed Care – PPO | Admitting: Internal Medicine

## 2012-01-25 ENCOUNTER — Other Ambulatory Visit: Payer: Self-pay | Admitting: Gynecology

## 2012-01-25 DIAGNOSIS — Z1231 Encounter for screening mammogram for malignant neoplasm of breast: Secondary | ICD-10-CM

## 2012-01-27 ENCOUNTER — Ambulatory Visit
Admission: RE | Admit: 2012-01-27 | Discharge: 2012-01-27 | Disposition: A | Discharge status: Home / Self Care | Payer: BC Managed Care – PPO | Source: Ambulatory Visit | Attending: Gynecology | Admitting: Gynecology

## 2012-01-27 DIAGNOSIS — Z1231 Encounter for screening mammogram for malignant neoplasm of breast: Secondary | ICD-10-CM

## 2012-03-06 ENCOUNTER — Ambulatory Visit (INDEPENDENT_AMBULATORY_CARE_PROVIDER_SITE_OTHER): Payer: BC Managed Care – PPO | Admitting: Internal Medicine

## 2012-03-06 ENCOUNTER — Encounter: Payer: Self-pay | Admitting: Internal Medicine

## 2012-03-06 VITALS — BP 132/100 | HR 85 | Temp 98.3°F | Ht 61.5 in | Wt 166.5 lb

## 2012-03-06 DIAGNOSIS — F411 Generalized anxiety disorder: Secondary | ICD-10-CM

## 2012-03-06 DIAGNOSIS — Z0000 Encounter for general adult medical examination without abnormal findings: Secondary | ICD-10-CM

## 2012-03-06 MED ORDER — PHENTERMINE HCL 37.5 MG PO CAPS: 37.5000 mg | ORAL_CAPSULE | ORAL | Status: DC

## 2012-03-06 MED ORDER — DULOXETINE HCL 60 MG PO CPEP: 60.0000 mg | ORAL_CAPSULE | Freq: Every day | ORAL | Status: DC

## 2012-03-06 NOTE — Assessment & Plan Note (Signed)
For cymbalta with samples and rx today, also gave phentermine limited rx to help with wt loss

## 2012-03-06 NOTE — Progress Notes (Signed)
Subjective:    Patient ID: [name], female    DOB: 1966-04-24, 46 y.o.   MRN: [id number]  HPI  Here for wellness and f/u;  Overall doing ok;  Pt denies CP, worsening SOB, DOE, wheezing, orthopnea, PND, worsening LE edema, palpitations, dizziness or syncope.  Pt denies neurological change such as new Headache, facial or extremity weakness.  Pt denies polydipsia, polyuria, or low sugar symptoms. Pt states overall good compliance with treatment and medications, good tolerability, and trying to follow lower cholesterol diet.  Pt denies worsening depressive symptoms, suicidal ideation or panic. No fever, wt loss, night sweats, loss of appetite, or other constitutional symptoms.  Pt states good ability with ADL's, low fall risk, home safety reviewed and adequate, no significant changes in hearing or vision, and occasionally active with exercise.  Has not had elev BP in the past, hard to keep wt down.  Asks for med for stress, increased recently, family issues, recently had to move. Past Medical History  Diagnosis Date  . Anxiety   . Depression   . Hyperlipidemia   . Hypertension   . Obesity   . Allergic rhinitis   . GERD (gastroesophageal reflux disease)   . Hyperplastic colon polyp 10/2003  . IBS (irritable bowel syndrome)   . RLS (restless legs syndrome)   . Menorrhagia   . ALLERGIC RHINITIS 03/26/2007  . ANXIETY 03/26/2007  . COLONIC POLYPS, HX OF 03/26/2007  . DEPRESSION 03/26/2007  . GERD 03/26/2007  . HYPERLIPIDEMIA 03/26/2007  . HYPERSOMNIA 07/16/2008  . HYPERTENSION 03/26/2007  . IBS 03/26/2007   Past Surgical History  Procedure Date  . Tubal ligation   . Endometrial ablation 2007    Dr. Richardson Dopp at [facility]  . Salivary gland surgery 12-11    LEFT SIDE NODULE REMOVED WAS BENIGN    reports that she has never smoked. She has never used smokeless tobacco. She reports that she drinks alcohol. She reports that she does not use illicit drugs. family history includes Cancer in her mother; Colon  polyps in her father; Heart attack in her other; Hyperlipidemia in her other; and Hypertension in her other. Allergies  Allergen Reactions  . Other     POLLEN AND DUST   Current Outpatient Prescriptions on File Prior to Visit  Medication Sig Dispense Refill  . albuterol (PROAIR HFA) 108 (90 BASE) MCG/ACT inhaler Inhale 2 puffs into the lungs 4 (four) times daily.        . hydrochlorothiazide (MICROZIDE) 12.5 MG capsule TAKE ONE CAPSULE BY MOUTH EVERY DAY  90 capsule  2  . lisinopril (PRINIVIL,ZESTRIL) 5 MG tablet TAKE ONE TABLET BY MOUTH EVERY DAY  90 tablet  2  . predniSONE (DELTASONE) 10 MG tablet 1 tab by mouth per day for 5 days  5 tablet  0  . promethazine (PHENERGAN) 25 MG tablet Take 1 tablet (25 mg total) by mouth every 6 (six) hours as needed for nausea.  40 tablet  0  . DISCONTD: citalopram (CELEXA) 10 MG tablet 3 tabs by mouth once per day  270 tablet  3  . DISCONTD: fexofenadine (ALLEGRA) 180 MG tablet Take 1 tablet (180 mg total) by mouth daily.  30 tablet  11  . DISCONTD: fluticasone (FLONASE) 50 MCG/ACT nasal spray 2 sprays by Nasal route daily.  16 g  2  . DISCONTD: omeprazole (PRILOSEC) 40 MG capsule Take 40 mg by mouth. One each night at bedtime        Review of Systems  Review of Systems  Constitutional: Negative for diaphoresis, activity change, appetite change and unexpected weight change.  HENT: Negative for hearing loss, ear pain, facial swelling, mouth sores and neck stiffness.   Eyes: Negative for pain, redness and visual disturbance.  Respiratory: Negative for shortness of breath and wheezing.   Cardiovascular: Negative for chest pain and palpitations.  Gastrointestinal: Negative for diarrhea, blood in stool, abdominal distention and rectal pain.  Genitourinary: Negative for hematuria, flank pain and decreased urine volume.  Musculoskeletal: Negative for myalgias and joint swelling.  Skin: Negative for color change and wound.  Neurological: Negative for syncope  and numbness.  Hematological: Negative for adenopathy.  Psychiatric/Behavioral: Negative for hallucinations, self-injury, decreased concentration and agitation.      Objective:   Physical Exam BP 132/100  Pulse 85  Temp 98.3 F (36.8 C) (Oral)  Ht 5' 1.5" (1.562 m)  Wt 166 lb 8 oz (75.524 kg)  BMI 30.95 kg/m2  SpO2 99% Physical Exam  VS noted Constitutional: Pt is oriented to person, place, and time. Appears well-developed and well-nourished.  Head: Normocephalic and atraumatic.  Right Ear: External ear normal.  Left Ear: External ear normal.  Nose: Nose normal.  Mouth/Throat: Oropharynx is clear and moist.  Eyes: Conjunctivae and EOM are normal. Pupils are equal, round, and reactive to light.  Neck: Normal range of motion. Neck supple. No JVD present. No tracheal deviation present.  Cardiovascular: Normal rate, regular rhythm, normal heart sounds and intact distal pulses.   Pulmonary/Chest: Effort normal and breath sounds normal.  Abdominal: Soft. Bowel sounds are normal. There is no tenderness.  Musculoskeletal: Normal range of motion. Exhibits no edema.  Lymphadenopathy:  Has no cervical adenopathy.  Neurological: Pt is alert and oriented to person, place, and time. Pt has normal reflexes. No cranial nerve deficit.  Skin: Skin is warm and dry. No rash noted.  Psychiatric:  1-2+ nervous. Behavior is normal.     Assessment & Plan:

## 2012-03-06 NOTE — Patient Instructions (Addendum)
Take all new medications as prescribed - the cymbalta, and the phentermine Continue all other medications as before Please return in 1 year for your yearly visit, or sooner if needed, with Lab testing done 3-5 days before

## 2012-03-06 NOTE — Assessment & Plan Note (Signed)

## 2012-07-21 ENCOUNTER — Ambulatory Visit: Payer: BC Managed Care – PPO | Admitting: Gynecology

## 2012-07-23 ENCOUNTER — Emergency Department (HOSPITAL_COMMUNITY)
Admission: EM | Admit: 2012-07-23 | Discharge: 2012-07-23 | Disposition: A | Discharge status: Home / Self Care | Payer: BC Managed Care – PPO | Attending: Emergency Medicine | Admitting: Emergency Medicine

## 2012-07-23 ENCOUNTER — Encounter (HOSPITAL_COMMUNITY): Payer: Self-pay

## 2012-07-23 DIAGNOSIS — R51 Headache: Secondary | ICD-10-CM | POA: Insufficient documentation

## 2012-07-23 DIAGNOSIS — Y9389 Activity, other specified: Secondary | ICD-10-CM | POA: Insufficient documentation

## 2012-07-23 DIAGNOSIS — Z8719 Personal history of other diseases of the digestive system: Secondary | ICD-10-CM | POA: Insufficient documentation

## 2012-07-23 DIAGNOSIS — Y92410 Unspecified street and highway as the place of occurrence of the external cause: Secondary | ICD-10-CM | POA: Insufficient documentation

## 2012-07-23 DIAGNOSIS — Z8739 Personal history of other diseases of the musculoskeletal system and connective tissue: Secondary | ICD-10-CM | POA: Insufficient documentation

## 2012-07-23 DIAGNOSIS — I10 Essential (primary) hypertension: Secondary | ICD-10-CM | POA: Insufficient documentation

## 2012-07-23 DIAGNOSIS — Z8742 Personal history of other diseases of the female genital tract: Secondary | ICD-10-CM | POA: Insufficient documentation

## 2012-07-23 DIAGNOSIS — Z860100 Personal history of colon polyps, unspecified: Secondary | ICD-10-CM | POA: Insufficient documentation

## 2012-07-23 DIAGNOSIS — E785 Hyperlipidemia, unspecified: Secondary | ICD-10-CM | POA: Insufficient documentation

## 2012-07-23 DIAGNOSIS — Z8709 Personal history of other diseases of the respiratory system: Secondary | ICD-10-CM | POA: Insufficient documentation

## 2012-07-23 DIAGNOSIS — K219 Gastro-esophageal reflux disease without esophagitis: Secondary | ICD-10-CM | POA: Insufficient documentation

## 2012-07-23 DIAGNOSIS — IMO0002 Reserved for concepts with insufficient information to code with codable children: Secondary | ICD-10-CM | POA: Insufficient documentation

## 2012-07-23 DIAGNOSIS — E669 Obesity, unspecified: Secondary | ICD-10-CM | POA: Insufficient documentation

## 2012-07-23 DIAGNOSIS — S161XXA Strain of muscle, fascia and tendon at neck level, initial encounter: Secondary | ICD-10-CM

## 2012-07-23 DIAGNOSIS — Z9889 Other specified postprocedural states: Secondary | ICD-10-CM | POA: Insufficient documentation

## 2012-07-23 DIAGNOSIS — Z8659 Personal history of other mental and behavioral disorders: Secondary | ICD-10-CM | POA: Insufficient documentation

## 2012-07-23 DIAGNOSIS — Z79899 Other long term (current) drug therapy: Secondary | ICD-10-CM | POA: Insufficient documentation

## 2012-07-23 DIAGNOSIS — S139XXA Sprain of joints and ligaments of unspecified parts of neck, initial encounter: Secondary | ICD-10-CM | POA: Insufficient documentation

## 2012-07-23 MED ORDER — HYDROCODONE-ACETAMINOPHEN 5-500 MG PO TABS: 1.0000 | ORAL_TABLET | Freq: Four times a day (QID) | ORAL | Status: DC | PRN

## 2012-07-23 MED ORDER — CYCLOBENZAPRINE HCL 10 MG PO TABS: 10.0000 mg | ORAL_TABLET | Freq: Two times a day (BID) | ORAL | Status: DC | PRN

## 2012-07-23 MED ORDER — IBUPROFEN 600 MG PO TABS: 600.0000 mg | ORAL_TABLET | Freq: Four times a day (QID) | ORAL | Status: DC | PRN
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
## 2012-07-23 NOTE — ED Notes (Signed)
EDPA notified of BP. EDPA gave orders to have pt follow up with PCP in 3 days as suggested.
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
## 2012-07-25 NOTE — ED Provider Notes (Signed)
Medical screening examination/treatment/procedure(s) were performed by non-physician practitioner and as supervising physician I was immediately available for consultation/collaboration.  Doug Sou, MD 07/25/12 1121

## 2012-07-26 ENCOUNTER — Ambulatory Visit (INDEPENDENT_AMBULATORY_CARE_PROVIDER_SITE_OTHER): Payer: BC Managed Care – PPO | Admitting: Internal Medicine

## 2012-07-26 ENCOUNTER — Encounter: Payer: Self-pay | Admitting: Internal Medicine

## 2012-07-26 ENCOUNTER — Ambulatory Visit: Payer: BC Managed Care – PPO | Admitting: Gynecology

## 2012-07-26 VITALS — BP 142/86 | HR 93 | Temp 98.2°F | Ht 61.5 in | Wt 160.0 lb

## 2012-07-26 DIAGNOSIS — B379 Candidiasis, unspecified: Secondary | ICD-10-CM

## 2012-07-26 DIAGNOSIS — B49 Unspecified mycosis: Secondary | ICD-10-CM

## 2012-07-26 DIAGNOSIS — H6690 Otitis media, unspecified, unspecified ear: Secondary | ICD-10-CM

## 2012-07-26 DIAGNOSIS — S139XXA Sprain of joints and ligaments of unspecified parts of neck, initial encounter: Secondary | ICD-10-CM

## 2012-07-26 DIAGNOSIS — S161XXA Strain of muscle, fascia and tendon at neck level, initial encounter: Secondary | ICD-10-CM

## 2012-07-26 DIAGNOSIS — H6691 Otitis media, unspecified, right ear: Secondary | ICD-10-CM

## 2012-07-26 DIAGNOSIS — G44319 Acute post-traumatic headache, not intractable: Secondary | ICD-10-CM

## 2012-07-26 MED ORDER — AMOXICILLIN-POT CLAVULANATE 875-125 MG PO TABS: 1.0000 | ORAL_TABLET | Freq: Two times a day (BID) | ORAL | Status: DC

## 2012-07-26 MED ORDER — FLUCONAZOLE 150 MG PO TABS: 150.0000 mg | ORAL_TABLET | Freq: Once | ORAL | Status: DC
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
## 2012-07-27 ENCOUNTER — Telehealth: Payer: Self-pay | Admitting: Internal Medicine

## 2012-07-27 NOTE — Telephone Encounter (Signed)
Caller: Althia/Patient; Phone: [phone]; Reason for Call: Patient was seen in office yesterday and diagnosed with an ear infection.  She is not feeling well enough to return to work today.  She is requesting a work note for today.  Please call her back and let her know when she can pick this up or if she can have a note.  Thanks.

## 2012-07-27 NOTE — Telephone Encounter (Signed)
Ok for note; to robin to handle 

## 2012-07-27 NOTE — Telephone Encounter (Signed)
Completed letter.  Called the patient left detailed message that letter requested is ready for pickuop at the front desk.

## 2012-09-06 ENCOUNTER — Encounter: Payer: Self-pay | Admitting: Internal Medicine

## 2012-09-06 ENCOUNTER — Ambulatory Visit (INDEPENDENT_AMBULATORY_CARE_PROVIDER_SITE_OTHER): Payer: BC Managed Care – PPO | Admitting: Internal Medicine

## 2012-09-06 ENCOUNTER — Other Ambulatory Visit: Payer: Self-pay | Admitting: Internal Medicine

## 2012-09-06 ENCOUNTER — Other Ambulatory Visit (INDEPENDENT_AMBULATORY_CARE_PROVIDER_SITE_OTHER): Payer: BC Managed Care – PPO

## 2012-09-06 VITALS — BP 130/92 | HR 102 | Temp 98.8°F

## 2012-09-06 DIAGNOSIS — R300 Dysuria: Secondary | ICD-10-CM

## 2012-09-06 DIAGNOSIS — R109 Unspecified abdominal pain: Secondary | ICD-10-CM

## 2012-09-06 DIAGNOSIS — D72829 Elevated white blood cell count, unspecified: Secondary | ICD-10-CM

## 2012-09-06 DIAGNOSIS — R1033 Periumbilical pain: Secondary | ICD-10-CM

## 2012-09-06 DIAGNOSIS — R102 Pelvic and perineal pain: Secondary | ICD-10-CM

## 2012-09-06 LAB — URINALYSIS, ROUTINE W REFLEX MICROSCOPIC
Bilirubin Urine: NEGATIVE
Hgb urine dipstick: NEGATIVE
Total Protein, Urine: NEGATIVE
Urine Glucose: NEGATIVE
pH: 6.5 (ref 5.0–8.0)

## 2012-09-06 LAB — CBC WITH DIFFERENTIAL/PLATELET
Basophils Relative: 0 % (ref 0.0–3.0)
Eosinophils Relative: 0.1 % (ref 0.0–5.0)
Lymphs Abs: 2.1 10*3/uL (ref 0.7–4.0)
MCHC: 33.3 g/dL (ref 30.0–36.0)
Monocytes Absolute: 0.5 10*3/uL (ref 0.1–1.0)
Neutro Abs: 18.9 10*3/uL — ABNORMAL HIGH (ref 1.4–7.7)
Platelets: 221 10*3/uL (ref 150.0–400.0)
RBC: 4.2 Mil/uL (ref 3.87–5.11)
RDW: 14.7 % — ABNORMAL HIGH (ref 11.5–14.6)

## 2012-09-06 LAB — BASIC METABOLIC PANEL
CO2: 27 mEq/L (ref 19–32)
Chloride: 104 mEq/L (ref 96–112)
Sodium: 139 mEq/L (ref 135–145)

## 2012-09-06 MED ORDER — CIPROFLOXACIN HCL 500 MG PO TABS: 500.0000 mg | ORAL_TABLET | Freq: Two times a day (BID) | ORAL | Status: DC

## 2012-09-06 NOTE — Patient Instructions (Signed)
It was good to see you today. Test(s) ordered today. Your results will be released to MyChart (or called to you) after review, usually within 72hours after test completion. If any changes need to be made, you will be notified at that same time. Start Cipro antibiotics 2x/day x 1 week Your prescription(s) have been submitted to your pharmacy. Please take as directed and contact our office if you believe you are having problem(s) with the medication(s). Use ibuprofen 400-600mg  every 6 hours as needed for aches, pain and fever symptoms as discussed  Work excuse note for today as discussed

## 2012-09-06 NOTE — Progress Notes (Signed)
Subjective:    Patient ID: [name], female    DOB: 1965/10/27, 47 y.o.   MRN: [id number]  Abdominal Pain This is a new problem. The current episode started today. The onset quality is sudden. The most recent episode lasted 2 hours. The problem has been waxing and waning. The pain is located in the periumbilical region, suprapubic region and LLQ. The pain is moderate. The quality of the pain is cramping, a sensation of fullness, aching and colicky. The abdominal pain radiates to the perineum. Associated symptoms include constipation and dysuria. Pertinent negatives include no belching, fever, frequency, hematochezia, hematuria, melena, nausea or vomiting. The pain is aggravated by movement and urination. The pain is relieved by nothing. Treatments tried: NSAIDs. The treatment provided moderate relief. Her past medical history is significant for abdominal surgery and irritable bowel syndrome.   Past Medical History  Diagnosis Date  . Anxiety   . Depression   . Hyperlipidemia   . Hypertension   . Obesity   . Allergic rhinitis   . GERD (gastroesophageal reflux disease)   . Hyperplastic colon polyp 10/2003  . IBS (irritable bowel syndrome)   . RLS (restless legs syndrome)   . Menorrhagia   . HYPERSOMNIA     Review of Systems  Constitutional: Negative for fever.  Gastrointestinal: Positive for abdominal pain and constipation. Negative for nausea, vomiting, melena and hematochezia.  Genitourinary: Positive for dysuria. Negative for frequency and hematuria.       Objective:   Physical Exam BP 130/92  Pulse 102  Temp(Src) 98.8 F (37.1 C) (Oral)  SpO2 97% Wt Readings from Last 3 Encounters:  07/26/12 160 lb (72.576 kg)  03/06/12 166 lb 8 oz (75.524 kg)  10/25/11 164 lb (74.39 kg)   Constitutional: She appears well-developed and well-nourished. Nontoxic, but mildly uncomfortable -no acute distress.  Neck: Normal range of motion. Neck supple. No JVD present. No thyromegaly  present.  Cardiovascular: Normal rate, regular rhythm and normal heart sounds.  No murmur heard. No BLE edema. Pulmonary/Chest: Effort normal and breath sounds normal. No respiratory distress. She has no wheezes.  Abdominal: Soft. Bowel sounds are slightly diminshed. She exhibits no distension. There is mild tenderness periumbilical, L>R, LLQ >suprapubic - but no r/g Skin: Skin is warm and dry. No rash noted. No erythema.    Lab Results  Component Value Date   WBC 9.3 01/18/2012   HGB 12.5 01/18/2012   HCT 37.3 01/18/2012   PLT 203.0 01/18/2012   GLUCOSE 100* 01/18/2012   CHOL 221* 01/18/2012   TRIG 96.0 01/18/2012   HDL 45.70 01/18/2012   LDLDIRECT 153.9 01/18/2012   LDLCALC 131* 12/29/2009   ALT 9 01/18/2012   AST 16 01/18/2012   NA 138 01/18/2012   K 3.6 01/18/2012   CL 104 01/18/2012   CREATININE 0.8 01/18/2012   BUN 11 01/18/2012   CO2 25 01/18/2012   TSH 0.90 01/18/2012      Assessment & Plan:   abdominal pain - suprapubic and periumbilical New constipation (<2d) Acute onset <24h, currently improved s/p ibuprofen  - initially very intense afebrile but diminished bowel sounds and tender without rebound  check labs Start Cipro for suspected infection - ?UTI (hx same but never this severe) or divertic/GI Continue ibuprofen prn pain If no significant UTI on dip, consider imaging if pain not significantly improved  Addendum: WBC 21k, UA unremarkable Called pt on mobile - pain still min at this time Refer for CT a/p with CM in AM  Pt to begin Cipro and continue ibuprofen as rx'd, to ER if worse or severe symptoms overnight Pt expresses understanding and agrees

## 2012-09-07 ENCOUNTER — Ambulatory Visit (INDEPENDENT_AMBULATORY_CARE_PROVIDER_SITE_OTHER)
Admission: RE | Admit: 2012-09-07 | Discharge: 2012-09-07 | Disposition: A | Discharge status: Home / Self Care | Payer: BC Managed Care – PPO | Source: Ambulatory Visit | Attending: Internal Medicine | Admitting: Internal Medicine

## 2012-09-07 DIAGNOSIS — D72829 Elevated white blood cell count, unspecified: Secondary | ICD-10-CM

## 2012-09-07 DIAGNOSIS — R102 Pelvic and perineal pain: Secondary | ICD-10-CM

## 2012-09-07 DIAGNOSIS — R109 Unspecified abdominal pain: Secondary | ICD-10-CM

## 2012-09-07 DIAGNOSIS — R1033 Periumbilical pain: Secondary | ICD-10-CM

## 2012-09-07 DIAGNOSIS — R300 Dysuria: Secondary | ICD-10-CM

## 2012-09-07 LAB — URINE CULTURE: Colony Count: 8000

## 2012-09-07 MED ORDER — IOHEXOL 300 MG/ML  SOLN
100.0000 mL | Freq: Once | INTRAMUSCULAR | Status: AC | PRN
  Administered 2012-09-07: 100 mL via INTRAVENOUS

## 2012-09-08 ENCOUNTER — Encounter: Payer: Self-pay | Admitting: Internal Medicine

## 2012-10-12 ENCOUNTER — Ambulatory Visit (INDEPENDENT_AMBULATORY_CARE_PROVIDER_SITE_OTHER): Payer: BC Managed Care – PPO | Admitting: Women's Health

## 2012-10-12 ENCOUNTER — Encounter: Payer: Self-pay | Admitting: Women's Health

## 2012-10-12 DIAGNOSIS — R350 Frequency of micturition: Secondary | ICD-10-CM

## 2012-10-12 DIAGNOSIS — N898 Other specified noninflammatory disorders of vagina: Secondary | ICD-10-CM

## 2012-10-12 DIAGNOSIS — Z113 Encounter for screening for infections with a predominantly sexual mode of transmission: Secondary | ICD-10-CM

## 2012-10-12 LAB — URINALYSIS W MICROSCOPIC + REFLEX CULTURE
Bilirubin Urine: NEGATIVE
Crystals: NONE SEEN
Glucose, UA: NEGATIVE mg/dL
Protein, ur: NEGATIVE mg/dL
RBC / HPF: NONE SEEN RBC/hpf (ref <3)
Specific Gravity, Urine: 1.015 (ref 1.005–1.030)
Urobilinogen, UA: 0.2 mg/dL (ref 0.0–1.0)

## 2012-10-12 LAB — WET PREP FOR TRICH, YEAST, CLUE

## 2012-10-12 MED ORDER — FLUCONAZOLE 150 MG PO TABS: 150.0000 mg | ORAL_TABLET | Freq: Once | ORAL | Status: DC

## 2012-10-12 MED ORDER — METRONIDAZOLE 500 MG PO TABS: ORAL_TABLET | ORAL | Status: DC

## 2012-10-12 NOTE — Progress Notes (Signed)
Patient ID: [name], female   DOB: 03-Jan-1966, 47 y.o.   MRN: [id number] Presents with complaint of vaginal discharge with irritation and odor. Urinary frequency with slight burning with no pain. New partner for 6 months. Denies abdominal pain, fever. Monthly cycle/ BTL.  Exam: Appears well, external genitalia slight erythema, speculum exam copious yellow frothy discharge positive for Trichomonas, clue cells, TNTC bacteria, GC/Chlamydia culture taken, bimanual no CMT or adnexal fullness or tenderness. UA: Small leukocytes, 3-6 WBCs,  few bacteria.  Trichomonas STD screen  Plan: Flagyl 2 g by mouth for both. Alcohol precautions reviewed. GC/Chlamydia culture pending, HIV, hep B, C., RPR. Condoms encouraged until permanent partner. Instructed to call or return if no relief of discharge. Urine culture pending.

## 2012-10-12 NOTE — Patient Instructions (Addendum)
Trichomoniasis Trichomoniasis is an infection, caused by the Trichomonas organism, that affects both women and men. In women, the outer female genitalia and the vagina are affected. In men, the penis is mainly affected, but the prostate and other reproductive organs can also be involved. Trichomoniasis is a sexually transmitted disease (STD) and is most often passed to another person through sexual contact. The majority of people who get trichomoniasis do so from a sexual encounter and are also at risk for other STDs. CAUSES   Sexual intercourse with an infected partner.  It can be present in swimming pools or hot tubs. SYMPTOMS   Abnormal gray-green frothy vaginal discharge in women.  Vaginal itching and irritation in women.  Itching and irritation of the area outside the vagina in women.  Penile discharge with or without pain in males.  Inflammation of the urethra (urethritis), causing painful urination.  Bleeding after sexual intercourse. RELATED COMPLICATIONS  Pelvic inflammatory disease.  Infection of the uterus (endometritis).  Infertility.  Tubal (ectopic) pregnancy.  It can be associated with other STDs, including gonorrhea and chlamydia, hepatitis B, and HIV. COMPLICATIONS DURING PREGNANCY  Early (premature) delivery.  Premature rupture of the membranes (PROM).  Low birth weight. DIAGNOSIS   Visualization of Trichomonas under the microscope from the vagina discharge.  Ph of the vagina greater than 4.5, tested with a test tape.  Trich Rapid Test.  Culture of the organism, but this is not usually needed.  It may be found on a Pap test.  Having a "strawberry cervix,"which means the cervix looks very red like a strawberry. TREATMENT   You may be given medication to fight the infection. Inform your caregiver if you could be or are pregnant. Some medications used to treat the infection should not be taken during pregnancy.  Over-the-counter medications or  creams to decrease itching or irritation may be recommended.  Your sexual partner will need to be treated if infected. HOME CARE INSTRUCTIONS   Take all medication prescribed by your caregiver.  Take over-the-counter medication for itching or irritation as directed by your caregiver.  Do not have sexual intercourse while you have the infection.  Do not douche or wear tampons.  Discuss your infection with your partner, as your partner may have acquired the infection from you. Or, your partner may have been the person who transmitted the infection to you.  Have your sex partner examined and treated if necessary.  Practice safe, informed, and protected sex.  See your caregiver for other STD testing. SEEK MEDICAL CARE IF:   You still have symptoms after you finish the medication.  You have an oral temperature above 102 F (38.9 C).  You develop belly (abdominal) pain.  You have pain when you urinate.  You have bleeding after sexual intercourse.  You develop a rash.  The medication makes you sick or makes you throw up (vomit). Document Released: 12/29/2000 Document Revised: 09/27/2011 Document Reviewed: 01/24/2009 ExitCare Patient Information 2013 [employer].  

## 2012-10-13 LAB — HEPATITIS B SURFACE ANTIGEN: Hepatitis B Surface Ag: NEGATIVE

## 2012-10-13 LAB — RPR

## 2012-10-13 LAB — GC/CHLAMYDIA PROBE AMP
CT Probe RNA: NEGATIVE
GC Probe RNA: NEGATIVE

## 2012-10-25 ENCOUNTER — Ambulatory Visit (INDEPENDENT_AMBULATORY_CARE_PROVIDER_SITE_OTHER): Payer: BC Managed Care – PPO | Admitting: Women's Health

## 2012-10-25 ENCOUNTER — Encounter: Payer: Self-pay | Admitting: Women's Health

## 2012-10-25 VITALS — BP 124/72 | Ht 62.5 in | Wt 158.0 lb

## 2012-10-25 DIAGNOSIS — B9689 Other specified bacterial agents as the cause of diseases classified elsewhere: Secondary | ICD-10-CM

## 2012-10-25 DIAGNOSIS — A499 Bacterial infection, unspecified: Secondary | ICD-10-CM

## 2012-10-25 DIAGNOSIS — N898 Other specified noninflammatory disorders of vagina: Secondary | ICD-10-CM

## 2012-10-25 DIAGNOSIS — Z01419 Encounter for gynecological examination (general) (routine) without abnormal findings: Secondary | ICD-10-CM

## 2012-10-25 DIAGNOSIS — N760 Acute vaginitis: Secondary | ICD-10-CM

## 2012-10-25 LAB — WET PREP FOR TRICH, YEAST, CLUE
Trich, Wet Prep: NONE SEEN
Yeast Wet Prep HPF POC: NONE SEEN

## 2012-10-25 MED ORDER — METRONIDAZOLE 0.75 % VA GEL: VAGINAL | Status: DC

## 2012-10-25 NOTE — Patient Instructions (Addendum)

## 2012-10-25 NOTE — Progress Notes (Signed)
[name] 1966/02/16 [id number]    History:    The patient presents for annual exam.  Regular light monthly cycles/endometrial ablation 12/10/2005 with good relief of menorrhagia. BTL/same partner. History of normal Paps and mammograms. Hypertension/hypercholesterolemia/IBS-primary care manages. Colonoscopy Dec 10, 2008 2 benign polyps. Mother died of colon cancer at age 45.   Past medical history, past surgical history, family history and social history were all reviewed and documented in the EPIC chart. 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 Father alcohol abuse.   ROS:  A  ROS was performed and pertinent positives and negatives are included in the history.  Exam:  Filed Vitals:   10/25/12 1003  BP: 124/72    General appearance:  Normal Head/Neck:  Normal, without cervical or supraclavicular adenopathy. Thyroid:  Symmetrical, normal in size, without palpable masses or nodularity. Respiratory  Effort:  Normal  Auscultation:  Clear without wheezing or rhonchi Cardiovascular  Auscultation:  Regular rate, without rubs, murmurs or gallops  Edema/varicosities:  Not grossly evident Abdominal  Soft,nontender, without masses, guarding or rebound.  Liver/spleen:  No organomegaly noted  Hernia:  None appreciated  Skin  Inspection:  Grossly normal  Palpation:  Grossly normal Neurologic/psychiatric  Orientation:  Normal with appropriate conversation.  Mood/affect:  Normal  Genitourinary    Breasts: Examined lying and sitting/Pendulous.     Right: Without masses, retractions, discharge or axillary adenopathy.     Left: Without masses, retractions, discharge or axillary adenopathy.   Inguinal/mons:  Normal without inguinal adenopathy  External genitalia:  Normal  BUS/Urethra/Skene's glands:  Normal  Bladder:  Normal  Vagina:  White adherent discharge, wet prep positive for clues and many  bacteria  Cervix:  Normal  Uterus:   normal in size, shape and contour.  Midline and mobile  Adnexa/parametria:     Rt: Without masses or tenderness.   Lt: Without masses or tenderness.  Anus and perineum: Normal  Digital rectal exam: Normal sphincter tone without palpated masses or tenderness  Assessment/Plan:  46 y.o. [id number] for annual exam with complaint of upper back and neck pain due to pendulous breasts.     Bacteria vaginosis Hypertension/hypercholesterolemia/ IBS-primary care labs and meds Mother died of colon cancer age 53, 2 benign colon polyps 2008/12/10  Plan: Home Hemoccult card given with instructions. Aware of importance of screening, will repeat 2015/Dr. Elnoria Howard. SBE's, continue annual mammogram, increase regular exercise and decrease calories for continued weight loss, calcium rich diet, vitamin D 1000 daily encouraged. Pap normal 2011-12-11, new screening guidelines reviewed. MetroGel vaginal cream 1 applicator at bedtime x5, alcohol precautions reviewed, instructed to call if no relief of discharge. Contemplating breast reduction, reviewed plastic surgeon to evaluate.  Harrington Challenger [facility], 10:54 AM 10/25/2012

## 2012-11-02 ENCOUNTER — Other Ambulatory Visit: Payer: Self-pay | Admitting: Internal Medicine

## 2012-12-22 ENCOUNTER — Ambulatory Visit: Payer: Self-pay | Admitting: Family Medicine

## 2012-12-22 VITALS — BP 142/86 | HR 71 | Temp 98.8°F | Resp 16 | Ht 62.0 in | Wt 163.0 lb

## 2012-12-22 DIAGNOSIS — Z0289 Encounter for other administrative examinations: Secondary | ICD-10-CM

## 2012-12-22 NOTE — Progress Notes (Signed)
 [facility]:  Office Visit  Chief Complaint:  Chief Complaint  Patient presents with  . Employment Physical    DOT    HPI: [name] is a 47 y.o. female who complains of  PE for DOT No complaints.  She is doing well She has been driving for 16 years Takes her medicines and does not have SEs. No CP, SOB, No prior MI Drives for [employer]   Past Medical History  Diagnosis Date  . Anxiety   . Depression   . Hyperlipidemia   . Hypertension   . Obesity   . Allergic rhinitis   . GERD (gastroesophageal reflux disease)   . Hyperplastic colon polyp 10/2003  . IBS (irritable bowel syndrome)   . RLS (restless legs syndrome)   . Menorrhagia    Past Surgical History  Procedure Laterality Date  . Tubal ligation    . Endometrial ablation  2007    Dr. Richardson Dopp at [facility]  . Salivary gland surgery  12-11    LEFT SIDE NODULE REMOVED WAS BENIGN   History   Social History  . Marital Status: Divorced    Spouse Name: N/A    Number of Children: 2  . Years of Education: N/A   Occupational History  .     Social History Main Topics  . Smoking status: Never Smoker   . Smokeless tobacco: Never Used  . Alcohol Use: Yes     Comment: occasional  . Drug Use: No  . Sexually Active: Yes     Comment: tubal ligation   Other Topics Concern  . None   Social History Narrative   Patient does not get regular exercise   Family History  Problem Relation Age of Onset  . Cancer Mother     colon cancer at 64 yo  . Colon polyps Father   . Hyperlipidemia Other   . Hypertension Other   . Heart attack Other    Allergies  Allergen Reactions  . Other     POLLEN AND DUST   Prior to Admission medications   Medication Sig Start Date End Date Taking? Authorizing Provider  albuterol (PROVENTIL HFA;VENTOLIN HFA) 108 (90 BASE) MCG/ACT inhaler Inhale 2 puffs into the lungs every 6 (six) hours as needed. Shortness of breath   Yes Historical Provider, MD   hydrochlorothiazide (MICROZIDE) 12.5 MG capsule Take 12.5 mg by mouth daily.   Yes Historical Provider, MD  hydrochlorothiazide (MICROZIDE) 12.5 MG capsule TAKE ONE CAPSULE BY MOUTH EVERY DAY 11/02/12  Yes Corwin Levins, MD  ibuprofen (ADVIL,MOTRIN) 600 MG tablet Take 1 tablet (600 mg total) by mouth every 6 (six) hours as needed for pain. 07/23/12  Yes Tatyana A Kirichenko, PA-C  lisinopril (PRINIVIL,ZESTRIL) 5 MG tablet Take 5 mg by mouth daily.   Yes Historical Provider, MD  lisinopril (PRINIVIL,ZESTRIL) 5 MG tablet TAKE ONE TABLET BY MOUTH EVERY DAY 11/02/12  Yes Corwin Levins, MD  cyclobenzaprine (FLEXERIL) 10 MG tablet Take 1 tablet (10 mg total) by mouth 2 (two) times daily as needed for muscle spasms. 07/23/12   Tatyana A Kirichenko, PA-C  metroNIDAZOLE (METROGEL VAGINAL) 0.75 % vaginal gel 1 applicator per vagina at HS x 5 10/25/12   Harrington Challenger, NP     ROS: The patient denies fevers, chills, night sweats, unintentional weight loss, chest pain, palpitations, wheezing, dyspnea on exertion, nausea, vomiting, abdominal pain, dysuria, hematuria, melena, numbness, weakness, or tingling.   All other systems  have been reviewed and were otherwise negative with the exception of those mentioned in the HPI and as above.    PHYSICAL EXAM: Filed Vitals:   12/22/12 0955  BP: 142/86  Pulse: 71  Temp: 98.8 F (37.1 C)  Resp: 16  Spo2   Filed Vitals:   12/22/12 0955  Height: 5\' 2"  (1.575 m)  Weight: 163 lb (73.936 kg)   Body mass index is 29.81 kg/(m^2).  General: Alert, no acute distress HEENT:  Normocephalic, atraumatic, oropharynx patent. EOMI, PERRLA, fundoscpic exam normal Cardiovascular:  Regular rate and rhythm, no rubs murmurs or gallops.  No Carotid bruits, radial pulse intact. No pedal edema.  Respiratory: Clear to auscultation bilaterally.  No wheezes, rales, or rhonchi.  No cyanosis, no use of accessory musculature GI: No organomegaly, abdomen is soft and non-tender, positive bowel  sounds.  No masses. Skin: No rashes. Neurologic: Facial musculature symmetric. Psychiatric: Patient is appropriate throughout our interaction. Lymphatic: No cervical lymphadenopathy Musculoskeletal: Gait intact. 5/5 strength,    LABS: Results for orders placed in visit on 10/25/12  WET PREP FOR TRICH, YEAST, CLUE      Result Value Range   Yeast Wet Prep HPF POC NONE SEEN  NONE SEEN   Trich, Wet Prep NONE SEEN  NONE SEEN   Clue Cells Wet Prep HPF POC MOD (*) NONE SEEN   WBC, Wet Prep HPF POC FEW  NONE SEEN     EKG/XRAY:   Primary read interpreted by Dr. Conley Rolls at [facility].   ASSESSMENT/PLAN: Encounter Diagnosis  Name Primary?  . Health examination of defined subpopulation Yes   DOT PE for 1 year due to HTN Advise to take meds regular Compliant, no SEs F/u in 1 year    ,  PHUONG, DO 12/23/2012 9:44 AM

## 2013-02-03 ENCOUNTER — Encounter: Payer: Self-pay | Admitting: Family Medicine

## 2013-02-07 ENCOUNTER — Other Ambulatory Visit: Payer: Self-pay | Admitting: Internal Medicine

## 2013-03-30 ENCOUNTER — Encounter: Payer: Self-pay | Admitting: Gastroenterology

## 2013-04-17 ENCOUNTER — Other Ambulatory Visit (INDEPENDENT_AMBULATORY_CARE_PROVIDER_SITE_OTHER): Payer: BC Managed Care – PPO

## 2013-04-17 ENCOUNTER — Ambulatory Visit (INDEPENDENT_AMBULATORY_CARE_PROVIDER_SITE_OTHER): Payer: BC Managed Care – PPO | Admitting: Internal Medicine

## 2013-04-17 ENCOUNTER — Encounter: Payer: Self-pay | Admitting: Internal Medicine

## 2013-04-17 VITALS — BP 140/100 | HR 75 | Temp 97.8°F | Ht 61.5 in | Wt 163.4 lb

## 2013-04-17 DIAGNOSIS — Z0000 Encounter for general adult medical examination without abnormal findings: Secondary | ICD-10-CM

## 2013-04-17 DIAGNOSIS — K5909 Other constipation: Secondary | ICD-10-CM | POA: Insufficient documentation

## 2013-04-17 DIAGNOSIS — K219 Gastro-esophageal reflux disease without esophagitis: Secondary | ICD-10-CM

## 2013-04-17 DIAGNOSIS — K5900 Constipation, unspecified: Secondary | ICD-10-CM

## 2013-04-17 LAB — URINALYSIS, ROUTINE W REFLEX MICROSCOPIC
Hgb urine dipstick: NEGATIVE
Nitrite: NEGATIVE
Specific Gravity, Urine: 1.015 (ref 1.000–1.030)
Total Protein, Urine: NEGATIVE
Urobilinogen, UA: 0.2 (ref 0.0–1.0)
pH: 7.5 (ref 5.0–8.0)

## 2013-04-17 LAB — BASIC METABOLIC PANEL
Calcium: 9.5 mg/dL (ref 8.4–10.5)
Creatinine, Ser: 0.7 mg/dL (ref 0.4–1.2)
GFR: 112.75 mL/min (ref >60.00)
Glucose, Bld: 85 mg/dL (ref 70–99)
Sodium: 138 mEq/L (ref 135–145)

## 2013-04-17 LAB — CBC WITH DIFFERENTIAL/PLATELET
Basophils Relative: 0.3 % (ref 0.0–3.0)
Eosinophils Absolute: 0 10*3/uL (ref 0.0–0.7)
Eosinophils Relative: 0.5 % (ref 0.0–5.0)
Lymphocytes Relative: 31 % (ref 12.0–46.0)
Monocytes Relative: 7.1 % (ref 3.0–12.0)
Neutrophils Relative %: 61.1 % (ref 43.0–77.0)
RBC: 4.18 Mil/uL (ref 3.87–5.11)
WBC: 7.8 10*3/uL (ref 4.5–10.5)

## 2013-04-17 LAB — LIPID PANEL
Cholesterol: 218 mg/dL — ABNORMAL HIGH (ref 0–200)
HDL: 49.1 mg/dL (ref >39.00)
Total CHOL/HDL Ratio: 4
Triglycerides: 81 mg/dL (ref 0.0–149.0)

## 2013-04-17 LAB — HEPATIC FUNCTION PANEL
AST: 16 U/L (ref 0–37)
Albumin: 4.3 g/dL (ref 3.5–5.2)
Alkaline Phosphatase: 43 U/L (ref 39–117)
Total Bilirubin: 0.8 mg/dL (ref 0.3–1.2)
Total Protein: 7.9 g/dL (ref 6.0–8.3)

## 2013-04-17 LAB — TSH: TSH: 0.55 u[IU]/mL (ref 0.35–5.50)

## 2013-04-17 MED ORDER — PANTOPRAZOLE SODIUM 40 MG PO TBEC: 40.0000 mg | DELAYED_RELEASE_TABLET | Freq: Every day | ORAL | Status: DC

## 2013-04-17 MED ORDER — LINACLOTIDE 145 MCG PO CAPS: 145.0000 ug | ORAL_CAPSULE | Freq: Every day | ORAL | Status: DC

## 2013-04-17 NOTE — Patient Instructions (Signed)
Please take the prilosec samples at 2 per day until gone, then take the protonix generic at 40 mg per day Please take all new medication as prescribed - the Linzess at 145 mg per day Please continue all other medications as before, and refills have been done if requested. Please have the pharmacy call with any other refills you may need. Please continue your efforts at being more active, low cholesterol diet, and weight control. You are otherwise up to date with prevention measures today. Please keep your appointments with your specialists as you have planned  - GI on Oct 27 Please go to the LAB in the Basement (turn left off the elevator) for the tests to be done today You will be contacted by phone if any changes need to be made immediately.  Otherwise, you will receive a letter about your results with an explanation, but please check with MyChart first.  Please remember to sign up for My Chart if you have not done so, as this will be important to you in the future with finding out test results, communicating by private email, and scheduling acute appointments online when needed.  Please return in 1 year for your yearly visit, or sooner if needed, with Lab testing done 3-5 days before

## 2013-04-17 NOTE — Assessment & Plan Note (Signed)

## 2013-04-17 NOTE — Assessment & Plan Note (Signed)
Ok for linzess 145 qd trial, f/u with GI as planned oct 27

## 2013-04-17 NOTE — Progress Notes (Signed)
Subjective:    Patient ID: [name], female    DOB: October 13, 1965, 47 y.o.   MRN: [id number]  HPI  Here for wellness and f/u;  Overall doing ok;  Pt denies CP, worsening SOB, DOE, wheezing, orthopnea, PND, worsening LE edema, palpitations, dizziness or syncope.  Pt denies neurological change such as new headache, facial or extremity weakness.  Pt denies polydipsia, polyuria, or low sugar symptoms. Pt states overall good compliance with treatment and medications, good tolerability, and has been trying to follow lower cholesterol diet.  Pt denies worsening depressive symptoms, suicidal ideation or panic. No fever, night sweats, wt loss, loss of appetite, or other constitutional symptoms.  Pt states good ability with ADL's, has low fall risk, home safety reviewed and adequate, no other significant changes in hearing or vision, and only occasionally active with exercise.   ALso c/o ongoing GI issues, c/o low midline abd pain and rectal pressure like sensation, crampy, intemittent, bloating, at times can get intense. Put off coming in as she was on her menses and sometimes menstrual cramps can be significant.  No help with heating pad. Denies other abd pain, dysphagia, n/v, or blood.  Had some decrease appetitie but minor.  Pt denies fever, wt loss, night sweats, loss of appetite, or other constitutional symptoms Had CT feb 2014 with constipation only.  Has appt oct 27 with GI for worsening recent acid reflux. Thinks her breath smells bad as well.  Not taking rx antacid, has been taking otc zantac which has helped and trying to adjust her diet. Having trouble with her bank, had an accnt closed , BP up today but has been OK at home, < 140/90.  Past Medical History  Diagnosis Date  . Anxiety   . Depression   . Hyperlipidemia   . Hypertension   . Obesity   . Allergic rhinitis   . GERD (gastroesophageal reflux disease)   . Hyperplastic colon polyp 10/2003  . IBS (irritable bowel syndrome)   . RLS  (restless legs syndrome)   . Menorrhagia    Past Surgical History  Procedure Laterality Date  . Tubal ligation    . Endometrial ablation  2007    Dr[facility] Richardson Dopp [facility]  . Salivary gland surgery  12-11    LEFT SIDE NODULE REMOVED WAS BENIGN    reports that she has never smoked. She has never used smokeless tobacco. She reports that  drinks alcohol. She reports that she does not use illicit drugs. family history includes Cancer in her mother; Colon polyps in her father; Heart attack in her other; Hyperlipidemia in her other; Hypertension in her other. Allergies  Allergen Reactions  . Other     POLLEN AND DUST   Current Outpatient Prescriptions on File Prior to Visit  Medication Sig Dispense Refill  . albuterol (PROVENTIL HFA;VENTOLIN HFA) 108 (90 BASE) MCG/ACT inhaler Inhale 2 puffs into the lungs every 6 (six) hours as needed. Shortness of breath      . cyclobenzaprine (FLEXERIL) 10 MG tablet Take 1 tablet (10 mg total) by mouth 2 (two) times daily as needed for muscle spasms.  20 tablet  0  . hydrochlorothiazide (MICROZIDE) 12.5 MG capsule TAKE ONE CAPSULE BY MOUTH ONCE DAILY  90 capsule  0  . ibuprofen (ADVIL,MOTRIN) 600 MG tablet Take 1 tablet (600 mg total) by mouth every 6 (six) hours as needed for pain.  30 tablet  0  . lisinopril (PRINIVIL,ZESTRIL) 5 MG tablet TAKE ONE TABLET BY MOUTH  ONCE DAILY  90 tablet  0  . [DISCONTINUED] citalopram (CELEXA) 10 MG tablet 3 tabs by mouth once per day  270 tablet  3  . [DISCONTINUED] fexofenadine (ALLEGRA) 180 MG tablet Take 1 tablet (180 mg total) by mouth daily.  30 tablet  11  . [DISCONTINUED] fluticasone (FLONASE) 50 MCG/ACT nasal spray 2 sprays by Nasal route daily.  16 g  2  . [DISCONTINUED] omeprazole (PRILOSEC) 40 MG capsule Take 40 mg by mouth. One each night at bedtime        No current facility-administered medications on file prior to visit.   Review of Systems Constitutional: Negative for diaphoresis, activity change, appetite  change or unexpected weight change.  HENT: Negative for hearing loss, ear pain, facial swelling, mouth sores and neck stiffness.   Eyes: Negative for pain, redness and visual disturbance.  Respiratory: Negative for shortness of breath and wheezing.   Cardiovascular: Negative for chest pain and palpitations.  Gastrointestinal: Negative for diarrhea, blood in stool, abdominal distention or other pain Genitourinary: Negative for hematuria, flank pain or change in urine volume.  Musculoskeletal: Negative for myalgias and joint swelling.  Skin: Negative for color change and wound.  Neurological: Negative for syncope and numbness. other than noted Hematological: Negative for adenopathy.  Psychiatric/Behavioral: Negative for hallucinations, self-injury, decreased concentration and agitation.      Objective:   Physical Exam BP 140/100  Pulse 75  Temp(Src) 97.8 F (36.6 C) (Oral)  Ht 5' 1.5" (1.562 m)  Wt 163 lb 6 oz (74.106 kg)  BMI 30.37 kg/m2  SpO2 99% VS noted,  Constitutional: Pt is oriented to person, place, and time. Appears well-developed and well-nourished.  Head: Normocephalic and atraumatic.  Right Ear: External ear normal.  Left Ear: External ear normal.  Nose: Nose normal.  Mouth/Throat: Oropharynx is clear and moist.  Eyes: Conjunctivae and EOM are normal. Pupils are equal, round, and reactive to light.  Neck: Normal range of motion. Neck supple. No JVD present. No tracheal deviation present.  Cardiovascular: Normal rate, regular rhythm, normal heart sounds and intact distal pulses.   Pulmonary/Chest: Effort normal and breath sounds normal.  Abdominal: Soft. Bowel sounds are normal. There is no tenderness. No HSM  Musculoskeletal: Normal range of motion. Exhibits no edema.  Lymphadenopathy:  Has no cervical adenopathy.  Neurological: Pt is alert and oriented to person, place, and time. Pt has normal reflexes. No cranial nerve deficit.  Skin: Skin is warm and dry. No rash  noted.  Psychiatric:  Has  normal mood and affect. Behavior is normal.     Assessment & Plan:

## 2013-04-17 NOTE — Assessment & Plan Note (Signed)
Ok for samples prilosec 20 qd, with rx for protonix 40 qd after, f/u GI oct 27 as planned

## 2013-04-18 ENCOUNTER — Telehealth: Payer: Self-pay | Admitting: *Deleted

## 2013-04-18 MED ORDER — AMLODIPINE BESYLATE 5 MG PO TABS: 5.0000 mg | ORAL_TABLET | Freq: Every day | ORAL | Status: DC

## 2013-04-18 MED ORDER — LISINOPRIL 20 MG PO TABS: 20.0000 mg | ORAL_TABLET | Freq: Every day | ORAL | Status: DC

## 2013-04-18 NOTE — Telephone Encounter (Signed)
Pt called states she is wanting to start Lisinopril 20mg  and Norvasc as per conversation at OV 9.30.14.  Please advise

## 2013-04-18 NOTE — Telephone Encounter (Signed)
Pt wish to stop the hct, adjust meds  Ok for change as per pt request   Robin to remind pt she is to STOP the fluid pill HCT

## 2013-04-19 NOTE — Telephone Encounter (Signed)
Unable to contact pt. 

## 2013-04-30 ENCOUNTER — Other Ambulatory Visit: Payer: Self-pay

## 2013-04-30 DIAGNOSIS — Z1231 Encounter for screening mammogram for malignant neoplasm of breast: Secondary | ICD-10-CM

## 2013-05-01 ENCOUNTER — Telehealth: Payer: Self-pay | Admitting: *Deleted

## 2013-05-01 MED ORDER — OMEPRAZOLE 20 MG PO CPDR
20.0000 mg | DELAYED_RELEASE_CAPSULE | Freq: Two times a day (BID) | ORAL | Status: DC
Start: 2013-05-01 — End: 2013-08-06

## 2013-05-01 NOTE — Telephone Encounter (Signed)
Pt called states Rx for Protonix and Linzess is too expensive.  Please advise

## 2013-05-01 NOTE — Telephone Encounter (Signed)
Ok to change to prilosec 20 bid, but there is no other generic for linzess or similar med, except to try miralax OTC daily

## 2013-05-02 NOTE — Telephone Encounter (Signed)
Spoke with pt advised of MDs message 

## 2013-05-11 ENCOUNTER — Ambulatory Visit: Payer: BC Managed Care – PPO | Admitting: Women's Health

## 2013-05-14 ENCOUNTER — Ambulatory Visit: Payer: BC Managed Care – PPO | Admitting: Gastroenterology

## 2013-05-18 ENCOUNTER — Ambulatory Visit: Payer: BC Managed Care – PPO | Admitting: Women's Health

## 2013-05-21 ENCOUNTER — Other Ambulatory Visit: Payer: Self-pay

## 2013-05-21 DIAGNOSIS — N898 Other specified noninflammatory disorders of vagina: Secondary | ICD-10-CM

## 2013-05-21 NOTE — Telephone Encounter (Signed)
[facility] faxed refill request Diflucan. Please advise. Unable to reach pt by phone to see if having symptoms.

## 2013-05-22 ENCOUNTER — Ambulatory Visit: Payer: BC Managed Care – PPO | Admitting: Women's Health

## 2013-05-22 MED ORDER — FLUCONAZOLE 150 MG PO TABS: 150.0000 mg | ORAL_TABLET | Freq: Once | ORAL | Status: DC

## 2013-05-24 ENCOUNTER — Other Ambulatory Visit: Payer: Self-pay

## 2013-05-25 ENCOUNTER — Ambulatory Visit
Admission: RE | Admit: 2013-05-25 | Discharge: 2013-05-25 | Disposition: A | Discharge status: Home / Self Care | Payer: BC Managed Care – PPO | Source: Ambulatory Visit

## 2013-05-25 DIAGNOSIS — Z1231 Encounter for screening mammogram for malignant neoplasm of breast: Secondary | ICD-10-CM

## 2013-05-30 ENCOUNTER — Encounter: Payer: Self-pay | Admitting: Women's Health

## 2013-05-30 ENCOUNTER — Other Ambulatory Visit: Payer: Self-pay | Admitting: Women's Health

## 2013-05-30 ENCOUNTER — Ambulatory Visit (INDEPENDENT_AMBULATORY_CARE_PROVIDER_SITE_OTHER): Payer: BC Managed Care – PPO

## 2013-05-30 ENCOUNTER — Ambulatory Visit (INDEPENDENT_AMBULATORY_CARE_PROVIDER_SITE_OTHER): Payer: BC Managed Care – PPO | Admitting: Women's Health

## 2013-05-30 DIAGNOSIS — D219 Benign neoplasm of connective and other soft tissue, unspecified: Secondary | ICD-10-CM

## 2013-05-30 DIAGNOSIS — D259 Leiomyoma of uterus, unspecified: Secondary | ICD-10-CM

## 2013-05-30 DIAGNOSIS — R109 Unspecified abdominal pain: Secondary | ICD-10-CM

## 2013-05-30 DIAGNOSIS — D251 Intramural leiomyoma of uterus: Secondary | ICD-10-CM

## 2013-05-30 DIAGNOSIS — N83209 Unspecified ovarian cyst, unspecified side: Secondary | ICD-10-CM

## 2013-05-30 DIAGNOSIS — N831 Corpus luteum cyst of ovary, unspecified side: Secondary | ICD-10-CM

## 2013-05-30 DIAGNOSIS — N898 Other specified noninflammatory disorders of vagina: Secondary | ICD-10-CM

## 2013-05-30 LAB — WET PREP FOR TRICH, YEAST, CLUE: Trich, Wet Prep: NONE SEEN

## 2013-05-30 MED ORDER — FLUCONAZOLE 150 MG PO TABS: 150.0000 mg | ORAL_TABLET | Freq: Once | ORAL | Status: DC

## 2013-05-30 NOTE — Progress Notes (Signed)
Patient ID: [name], female   DOB: [birth year], 47 y.o.   MRN: [id number] Presents with complaint of low abdominal midline pressure, low rt sided discomfort and rectal pressure , especially with BMs. Discomfort has increased over the past weeks and months. Light cycles, BTL with ablation Dec 16, 2005. Benign colon polyp 17-Dec-2003. Mother died of colon cancer age 75. Not sexually active. Minimal discharge, denies urinary symptoms. History of hypertension/IBS/hypercholesterolemia/anxiety.  Exam: Appears well, external genitalia within normal limits, speculum exam moderate amount of a white discharge noted wet prep positive for yeast, bimanual no CMT or right-sided tenderness left side mild tenderness. Rectal exam confirmatory with discomfort left-sided. Ultrasound: Anteverted uterus fibroid 30 x 23 mm displaces the endometrial cavity, right ovary normal. Left ovary located at fundus with thickwalled cyst 20 x 19 mm with positive CFD with low level echoes. Moderate amount of free fluid in the cul-de-sac.  Low abdominal pressure/rectal pressure Left ovarian cyst Yeast IBS  Plan: Diflucan 150 by mouth x1 dose prescription, proper use given and reviewed. Reviewed small uterine fibroid,left ovarian cyst are probably not cause of low abdominal pressure and rectal pressure. Repeat ultrasound in 2 months to check for resolution of cyst. Instructed to followup with GI Dr. Note written for work.

## 2013-05-31 ENCOUNTER — Encounter: Payer: Self-pay | Admitting: Gastroenterology

## 2013-06-26 ENCOUNTER — Encounter: Payer: Self-pay | Admitting: Gastroenterology

## 2013-06-26 ENCOUNTER — Ambulatory Visit (INDEPENDENT_AMBULATORY_CARE_PROVIDER_SITE_OTHER): Payer: BC Managed Care – PPO | Admitting: Gastroenterology

## 2013-06-26 VITALS — BP 130/70 | HR 60 | Ht 61.5 in | Wt 159.2 lb

## 2013-06-26 DIAGNOSIS — R109 Unspecified abdominal pain: Secondary | ICD-10-CM

## 2013-06-26 DIAGNOSIS — Z800 Family history of malignant neoplasm of digestive organs: Secondary | ICD-10-CM

## 2013-06-26 DIAGNOSIS — R198 Other specified symptoms and signs involving the digestive system and abdomen: Secondary | ICD-10-CM

## 2013-06-26 DIAGNOSIS — K5900 Constipation, unspecified: Secondary | ICD-10-CM

## 2013-06-26 NOTE — Patient Instructions (Signed)
Take Miralax every day for 3-5 days along with Linzess. Use Miralax as needed after that along with Linzess every day.   Call back if you continue to have rectal pressure and pain.   High-Fiber Diet Fiber is found in fruits, vegetables, and grains. A high-fiber diet encourages the addition of more whole grains, legumes, fruits, and vegetables in your diet. The recommended amount of fiber for adult males is 38 g per day. For adult females, it is 25 g per day. Pregnant and lactating women should get 28 g of fiber per day. If you have a digestive or bowel problem, ask your caregiver for advice before adding high-fiber foods to your diet. Eat a variety of high-fiber foods instead of only a select few type of foods.  PURPOSE  To increase stool bulk.  To make bowel movements more regular to prevent constipation.  To lower cholesterol.  To prevent overeating. WHEN IS THIS DIET USED?  It may be used if you have constipation and hemorrhoids.  It may be used if you have uncomplicated diverticulosis (intestine condition) and irritable bowel syndrome.  It may be used if you need help with weight management.  It may be used if you want to add it to your diet as a protective measure against atherosclerosis, diabetes, and cancer. SOURCES OF FIBER  Whole-grain breads and cereals.  Fruits, such as apples, oranges, bananas, berries, prunes, and pears.  Vegetables, such as green peas, carrots, sweet potatoes, beets, broccoli, cabbage, spinach, and artichokes.  Legumes, such split peas, soy, lentils.  Almonds. FIBER CONTENT IN FOODS Starches and Grains / Dietary Fiber (g)  Cheerios, 1 cup / 3 g  Corn Flakes cereal, 1 cup / 0.7 g  Rice crispy treat cereal, 1 cup / 0.3 g  Instant oatmeal (cooked),  cup / 2 g  Frosted wheat cereal, 1 cup / 5.1 g  Brown, long-grain rice (cooked), 1 cup / 3.5 g  White, long-grain rice (cooked), 1 cup / 0.6 g  Enriched macaroni (cooked), 1 cup / 2.5  g Legumes / Dietary Fiber (g)  Baked beans (canned, plain, or vegetarian),  cup / 5.2 g  Kidney beans (canned),  cup / 6.8 g  Pinto beans (cooked),  cup / 5.5 g Breads and Crackers / Dietary Fiber (g)  Plain or honey graham crackers, 2 squares / 0.7 g  Saltine crackers, 3 squares / 0.3 g  Plain, salted pretzels, 10 pieces / 1.8 g  Whole-wheat bread, 1 slice / 1.9 g  White bread, 1 slice / 0.7 g  Raisin bread, 1 slice / 1.2 g  Plain bagel, 3 oz / 2 g  Flour tortilla, 1 oz / 0.9 g  Corn tortilla, 1 small / 1.5 g  Hamburger or hotdog bun, 1 small / 0.9 g Fruits / Dietary Fiber (g)  Apple with skin, 1 medium / 4.4 g  Sweetened applesauce,  cup / 1.5 g  Banana,  medium / 1.5 g  Grapes, 10 grapes / 0.4 g  Orange, 1 small / 2.3 g  Raisin, 1.5 oz / 1.6 g  Melon, 1 cup / 1.4 g Vegetables / Dietary Fiber (g)  Green beans (canned),  cup / 1.3 g  Carrots (cooked),  cup / 2.3 g  Broccoli (cooked),  cup / 2.8 g  Peas (cooked),  cup / 4.4 g  Mashed potatoes,  cup / 1.6 g  Lettuce, 1 cup / 0.5 g  Corn (canned),  cup / 1.6 g  Tomato,  cup / 1.1 g Document Released: 07/05/2005 Document Revised: 01/04/2012 Document Reviewed: 10/07/2011 [facility] Patient Information 2014 [employer] [place].

## 2013-06-26 NOTE — Progress Notes (Signed)
    History of Present Illness: This is a 47 year old female who relates worsening constipation over the past several months. She also notes suprapubic and rectal pressure and pain associated with bowel movements that is relieved with bowel movements. She was started on Linzess about 6 weeks ago and her constipation and pain/pressure in her suprapubic area and rectum had improved. She underwent colonoscopy in July 2010 for a family history of colon cancer showing 2 hyperplastic polyps. Upper endoscopy performed in July 2010 was normal. She's undergone a recent GYN evaluation including a pelvic ultrasound that showed a 3 cm x 2.3 cm uterine fibroid, a 2 cm x 1.9 cm left ovarian cyst and some fluid in the cul-de-sac. Denies weight loss, diarrhea, change in stool caliber, melena, hematochezia, nausea, vomiting, dysphagia, reflux symptoms, chest pain.  Review of Systems: Pertinent positive and negative review of systems were noted in the above HPI section. All other review of systems were otherwise negative.  Current Medications, Allergies, Past Medical History, Past Surgical History, Family History and Social History were reviewed in [employer] record.  Physical Exam: General: Well developed , well nourished, no acute distress Head: Normocephalic and atraumatic Eyes:  sclerae anicteric, EOMI Ears: Normal auditory acuity Mouth: No deformity or lesions Neck: Supple, no masses or thyromegaly Lungs: Clear throughout to auscultation Heart: Regular rate and rhythm; no murmurs, rubs or bruits Abdomen: Soft, non tender and non distended. No masses, hepatosplenomegaly or hernias noted. Normal Bowel sounds Rectal: no lesions, no tenderness, large volume of heme neg brown stool in the vault Musculoskeletal: Symmetrical with no gross deformities  Skin: No lesions on visible extremities Pulses:  Normal pulses noted Extremities: No clubbing, cyanosis, edema or deformities  noted Neurological: Alert oriented x 4, grossly nonfocal Cervical Nodes:  No significant cervical adenopathy Inguinal Nodes: No significant inguinal adenopathy Psychological:  Alert and cooperative. Normal mood and affect  Assessment and Recommendations:  1. Rectal/suprapubic pain, pressure and constipation. She remains mildly constipated without complete evacuation. Add MiraLax once daily for the next 3-5 days. Continue Linzess daily. Increase dietary fiber and daily water intake. Her symptoms do not completely resolve over the next few weeks will plan to proceed with colonoscopy in the near future as opposed to waiting until her routine surveillance date in July.  2. Family history of colon cancer in her mother at age 32. Routine five-year surveillance colonoscopy due July 2015.

## 2013-08-06 ENCOUNTER — Encounter (HOSPITAL_COMMUNITY): Payer: Self-pay | Admitting: Emergency Medicine

## 2013-08-06 ENCOUNTER — Emergency Department (HOSPITAL_COMMUNITY): Payer: BC Managed Care – PPO

## 2013-08-06 ENCOUNTER — Emergency Department (HOSPITAL_COMMUNITY)
Admission: EM | Admit: 2013-08-06 | Discharge: 2013-08-06 | Disposition: A | Discharge status: Home / Self Care | Payer: BC Managed Care – PPO | Attending: Emergency Medicine | Admitting: Emergency Medicine

## 2013-08-06 DIAGNOSIS — R5381 Other malaise: Secondary | ICD-10-CM | POA: Insufficient documentation

## 2013-08-06 DIAGNOSIS — Z860100 Personal history of colon polyps, unspecified: Secondary | ICD-10-CM | POA: Insufficient documentation

## 2013-08-06 DIAGNOSIS — K589 Irritable bowel syndrome without diarrhea: Secondary | ICD-10-CM | POA: Insufficient documentation

## 2013-08-06 DIAGNOSIS — R5383 Other fatigue: Secondary | ICD-10-CM

## 2013-08-06 DIAGNOSIS — J069 Acute upper respiratory infection, unspecified: Secondary | ICD-10-CM | POA: Insufficient documentation

## 2013-08-06 DIAGNOSIS — E785 Hyperlipidemia, unspecified: Secondary | ICD-10-CM | POA: Insufficient documentation

## 2013-08-06 DIAGNOSIS — K219 Gastro-esophageal reflux disease without esophagitis: Secondary | ICD-10-CM | POA: Insufficient documentation

## 2013-08-06 DIAGNOSIS — Z8669 Personal history of other diseases of the nervous system and sense organs: Secondary | ICD-10-CM | POA: Insufficient documentation

## 2013-08-06 DIAGNOSIS — Z8742 Personal history of other diseases of the female genital tract: Secondary | ICD-10-CM | POA: Insufficient documentation

## 2013-08-06 DIAGNOSIS — Z79899 Other long term (current) drug therapy: Secondary | ICD-10-CM | POA: Insufficient documentation

## 2013-08-06 DIAGNOSIS — I10 Essential (primary) hypertension: Secondary | ICD-10-CM | POA: Insufficient documentation

## 2013-08-06 DIAGNOSIS — Z8659 Personal history of other mental and behavioral disorders: Secondary | ICD-10-CM | POA: Insufficient documentation

## 2013-08-06 DIAGNOSIS — E669 Obesity, unspecified: Secondary | ICD-10-CM | POA: Insufficient documentation

## 2013-08-06 DIAGNOSIS — J45901 Unspecified asthma with (acute) exacerbation: Secondary | ICD-10-CM | POA: Insufficient documentation

## 2013-08-06 MED ORDER — KETOROLAC TROMETHAMINE 60 MG/2ML IM SOLN
60.0000 mg | Freq: Once | INTRAMUSCULAR | Status: AC
  Administered 2013-08-06: 60 mg via INTRAMUSCULAR
  Filled 2013-08-06: qty 2

## 2013-08-06 NOTE — ED Notes (Signed)
Pt states lately she has been short of breath with activity  Pt states she has had a cough productive tan in color, runny nose clear in color, fever started yesterday evening about 430  Pt states she has had decreased appetite and pt states she feels sore all over  Pt denies nausea or vomiting

## 2013-08-06 NOTE — ED Provider Notes (Signed)
CSN: [id number]     Arrival date & time 08/06/13  2042 History   First MD Initiated Contact with Patient 08/06/13 2149     Chief Complaint  Patient presents with  . Fever  . Cough  . Shortness of Breath   (Consider location/radiation/quality/duration/timing/severity/associated sxs/prior Treatment) HPI Pt is a 48yo female with hx of allergic rhinitis and GERD c/o gradual onset generalized body aches, fever and fatigue that started 2 days ago.  Reports right sided chest pain and back pain worse with coughing.  Pt also reports SOB with activity over last 2 days. Cough is intermittent, productive in nature.  Tmax 101.9, fever started around 4:30pm yesterday. Reports hx of asthma but states she has not used inhaler at home for symptoms. Has tried alkaselzer cold with no relief. No other medications tried PTA. Pt reports decreased appetite. Denies n/v/d. Denies sick contacts or recent travel. Denies hx of PE, DVT or cardiac problems.  Denies leg pain or swelling. No recent travel.  Past Medical History  Diagnosis Date  . Anxiety   . Depression   . Hyperlipidemia   . Hypertension   . Obesity   . Allergic rhinitis   . GERD (gastroesophageal reflux disease)   . Hyperplastic colon polyp 10/2003  . IBS (irritable bowel syndrome)   . RLS (restless legs syndrome)   . Menorrhagia    Past Surgical History  Procedure Laterality Date  . Tubal ligation    . Endometrial ablation  2007    [facility] Landry [facility]  . Salivary gland surgery  12-11    LEFT SIDE NODULE REMOVED WAS BENIGN   Family History  Problem Relation Age of Onset  . Cancer Mother     colon cancer at 68 yo  . Colon polyps Father   . Hyperlipidemia Other   . Hypertension Other   . Heart attack Maternal Grandfather    History  Substance Use Topics  . Smoking status: Never Smoker   . Smokeless tobacco: Never Used  . Alcohol Use: No   OB History   Grav Para Term Preterm Abortions TAB SAB Ect Mult Living   2 2        2       Review of Systems  Constitutional: Positive for chills and fatigue.  Respiratory: Positive for cough and shortness of breath.   Cardiovascular: Positive for chest pain ( with cough).  All other systems reviewed and are negative.    Allergies  Other  Home Medications   Current Outpatient Rx  Name  Route  Sig  Dispense  Refill  . albuterol (PROVENTIL HFA;VENTOLIN HFA) 108 (90 BASE) MCG/ACT inhaler   Inhalation   Inhale 2 puffs into the lungs every 6 (six) hours as needed. Shortness of breath         . amLODipine (NORVASC) 5 MG tablet   Oral   Take 1 tablet (5 mg total) by mouth daily.   90 tablet   3   . Linaclotide (LINZESS) 145 MCG CAPS capsule   Oral   Take 1 capsule (145 mcg total) by mouth daily.   90 capsule   3   . lisinopril (PRINIVIL,ZESTRIL) 20 MG tablet   Oral   Take 1 tablet (20 mg total) by mouth daily.   90 tablet   3   . pantoprazole (PROTONIX) 40 MG tablet   Oral   Take 40 mg by mouth daily.          BP 100/53  Pulse 99  Temp(Src) 100.9 F (38.3 C) (Oral)  Resp 16  Ht 5\' 1"  (1.549 m)  Wt 155 lb (70.308 kg)  BMI 29.30 kg/m2  SpO2 98%  LMP 08/06/2013 Physical Exam  Nursing note and vitals reviewed. Constitutional: She appears well-developed and well-nourished. No distress.  Pt lying comfortably in exam bed, NAD.   HENT:  Head: Normocephalic and atraumatic.  Eyes: Conjunctivae are normal. No scleral icterus.  Neck: Normal range of motion.  Cardiovascular: Normal rate, regular rhythm and normal heart sounds.   Pulmonary/Chest: Effort normal and breath sounds normal. No respiratory distress. She has no wheezes. She has no rales. She exhibits tenderness ( right chest wall, mild tenderness with palpation).  No respiratory distress, able to speak in full sentences w/o difficulty. Lungs: CTAB  Abdominal: Soft. Bowel sounds are normal. She exhibits no distension and no mass. There is no tenderness. There is no rebound and no guarding.   Musculoskeletal: Normal range of motion.  Neurological: She is alert.  Skin: Skin is warm and dry. She is not diaphoretic.    ED Course  Procedures (including critical care time) Labs Review Labs Reviewed - No data to display Imaging Review Dg Chest 2 View  08/06/2013   CLINICAL DATA:  Fever.  Cough and shortness of breath.  EXAM: CHEST  2 VIEW  COMPARISON:  DG CHEST 2 VIEW dated 05/27/2010  FINDINGS: Midline trachea.  Normal heart size and mediastinal contours.  Sharp costophrenic angles.  No pneumothorax.  Clear lungs.  IMPRESSION: Normal chest.   Electronically Signed   By: Abigail Miyamoto M.D.   On: 08/06/2013 23:13    EKG Interpretation   None       MDM   1. URI, acute    Pt is a 48yo female presenting with URI type symptoms. CP not concerning for ACS. PERC negative.   CXR: unremarkable. Toradol given for pain and fever.    Will discharge pt home to f/u with PCP. Return precautions provided. Pt verbalized understanding and agreement with tx plan.   Noland Fordyce, PA-C 08/07/13 [phone]

## 2013-08-06 NOTE — Discharge Instructions (Signed)
Give 400 mg Ibuprofen (Motrin) every 6-8 hours for fever and pain  Alternate with Tylenol  Give 500 mg Tylenol every 4-6 hours as needed for fever and pain  Follow-up with your primary care provider next week for recheck of symptoms if not improving.  Be sure to drink plenty of fluids and rest, at least 8hrs of sleep a night, preferably more while you are sick. Return to the ED if you cannot keep down fluids/signs of dehydration, fever not reducing with Tylenol, difficulty breathing/wheezing, stiff neck, worsening condition, or other concerns (see below)    [employer] may help relieve the symptoms of a cough and cold. They add moisture to the air, which helps mucus to become thinner and less sticky. This makes it easier to breathe and cough up secretions. Cool mist vaporizers do not cause serious burns like hot mist vaporizers ("steamers, humidifiers"). Vaporizers have not been proved to show they help with colds. You should not use a vaporizer if you are allergic to mold.  HOME CARE INSTRUCTIONS  Follow the package instructions for the vaporizer.  Do not use anything other than distilled water in the vaporizer.  Do not run the vaporizer all of the time. This can cause mold or bacteria to grow in the vaporizer.  Clean the vaporizer after each time it is used.  Clean and dry the vaporizer well before storing it.  Stop using the vaporizer if worsening respiratory symptoms develop. Document Released: 04/01/2004 Document Revised: 03/07/2013 Document Reviewed: 11/22/2012 [facility] Patient Information 2014 [place], [place].  Cough, Adult  A cough is a reflex. It helps you clear your throat and airways. A cough can help heal your body. A cough can last 2 or 3 weeks (acute) or may last more than 8 weeks (chronic). Some common causes of a cough can include an infection, allergy, or a cold. HOME CARE  Only take medicine as told by your doctor.  If given, take your medicines  (antibiotics) as told. Finish them even if you start to feel better.  Use a cold steam vaporizer or humidier in your home. This can help loosen thick spit (secretions).  Sleep so you are almost sitting up (semi-upright). Use pillows to do this. This helps reduce coughing.  Rest as needed.  Stop smoking if you smoke. GET HELP RIGHT AWAY IF:  You have yellowish-white fluid (pus) in your thick spit.  Your cough gets worse.  Your medicine does not reduce coughing, and you are losing sleep.  You cough up blood.  You have trouble breathing.  Your pain gets worse and medicine does not help.  You have a fever. MAKE SURE YOU:   Understand these instructions.  Will watch your condition.  Will get help right away if you are not doing well or get worse. Document Released: 03/18/2011 Document Revised: 09/27/2011 Document Reviewed: 03/18/2011 [facility] Patient Information 2014 [place].

## 2013-08-06 NOTE — ED Notes (Signed)
Pt complains of body aches, fever and fatigue.  Pt reports pain in back and chest when coughing.

## 2013-08-08 NOTE — ED Provider Notes (Signed)
Medical screening examination/treatment/procedure(s) were performed by non-physician practitioner and as supervising physician I was immediately available for consultation/collaboration.  EKG Interpretation    Date/Time:  Monday August 06 2013 20:56:12 EST Ventricular Rate:  99 PR Interval:  166 QRS Duration: 83 QT Interval:  307 QTC Calculation: 394 R Axis:   52 Text Interpretation:  Sinus rhythm Probable left atrial enlargement ED PHYSICIAN INTERPRETATION AVAILABLE IN [facility] Confirmed by TEST, RECORD ([id number]) on 08/08/2013 9:57:28 AM             Jasper Riling. Alvino Chapel, MD 08/08/13 1455

## 2013-09-19 ENCOUNTER — Telehealth: Payer: Self-pay | Admitting: *Deleted

## 2013-09-19 NOTE — Telephone Encounter (Signed)
Patient phoned requesting samples for Linzess.  Samples for 2 months worth awaiting p/u and patient notified.

## 2013-11-08 ENCOUNTER — Ambulatory Visit (INDEPENDENT_AMBULATORY_CARE_PROVIDER_SITE_OTHER): Payer: BC Managed Care – PPO | Admitting: Family Medicine

## 2013-11-08 VITALS — BP 140/96 | HR 80 | Temp 98.5°F | Resp 16 | Ht 61.0 in | Wt 160.2 lb

## 2013-11-08 DIAGNOSIS — N760 Acute vaginitis: Secondary | ICD-10-CM

## 2013-11-08 DIAGNOSIS — N898 Other specified noninflammatory disorders of vagina: Secondary | ICD-10-CM

## 2013-11-08 DIAGNOSIS — R350 Frequency of micturition: Secondary | ICD-10-CM

## 2013-11-08 DIAGNOSIS — B9689 Other specified bacterial agents as the cause of diseases classified elsewhere: Secondary | ICD-10-CM

## 2013-11-08 LAB — POCT URINALYSIS DIPSTICK
BILIRUBIN UA: NEGATIVE
GLUCOSE UA: NEGATIVE
Ketones, UA: NEGATIVE
Leukocytes, UA: NEGATIVE
NITRITE UA: NEGATIVE
Protein, UA: NEGATIVE
SPEC GRAV UA: 1.015
Urobilinogen, UA: 0.2
pH, UA: 7

## 2013-11-08 LAB — POCT WET PREP WITH KOH
KOH Prep POC: NEGATIVE
RBC WET PREP PER HPF POC: NEGATIVE
Trichomonas, UA: NEGATIVE
YEAST WET PREP PER HPF POC: NEGATIVE

## 2013-11-08 LAB — POCT UA - MICROSCOPIC ONLY
CRYSTALS, UR, HPF, POC: NEGATIVE
Casts, Ur, LPF, POC: NEGATIVE
Mucus, UA: NEGATIVE
RBC, URINE, MICROSCOPIC: NEGATIVE
WBC, UR, HPF, POC: NEGATIVE
Yeast, UA: NEGATIVE

## 2013-11-08 MED ORDER — METRONIDAZOLE 500 MG PO TABS: 500.0000 mg | ORAL_TABLET | Freq: Two times a day (BID) | ORAL | Status: DC

## 2013-11-08 NOTE — Progress Notes (Addendum)
This chart was scribed for [employer]. Carlota Raspberry, MD by Marcha Dutton, ED Scribe. This patient was seen in room 9 and the patient's care was started at 2:29 PM.  Subjective:    Patient ID: [name], female    DOB: [birth year], 48 y.o.   MRN: [id number]  Chief Complaint  Patient presents with  . Vaginal Discharge    x 1 month    HPI [name] is a 48 y.o. female with a h/o fibroids and irregular menstrual cycles. Pt reports mild intermittent vaginal discharge that began about a month ago. She also reports associated sensitivity around the vagina, some itching that began recently, and odor that has been present since the beginning. In the last 2 or so days, she states she has felt some frequency of urination. She reports using Monistat 1 tablet and a medicated douche last month some time with minimal relief. Pt denies any vaginal pain, abdominal pain, nausea, vomiting, diarrhea, dysuria, urgency, fevers, chills, and back pain. She states she is not sexually active. Pt denies recent antibiotics. She states she takes linzess daily. Pt reports a h/o trichinosis in April of 2014. Pt states she has a f/u with her gynecologist last week and she hasn't had a pap smear in a while. She states that she's always had normal pap smears.   Tara Cower, MD  Patient Active Problem List   Diagnosis Date Noted  . Other and unspecified ovarian cyst 05/30/2013  . Chronic constipation 04/17/2013  . Bacterial vaginosis 10/25/2012  . Preventative health care 10/12/2010  . HYPERSOMNIA 07/16/2008  . HYPERLIPIDEMIA 03/26/2007  . ANXIETY 03/26/2007  . DEPRESSION 03/26/2007  . HYPERTENSION 03/26/2007  . ALLERGIC RHINITIS 03/26/2007  . GERD 03/26/2007  . IBS 03/26/2007  . COLONIC POLYPS, HX OF 03/26/2007   Past Medical History  Diagnosis Date  . Anxiety   . Depression   . Hyperlipidemia   . Hypertension   . Obesity   . Allergic rhinitis   . GERD (gastroesophageal reflux disease)   .  Hyperplastic colon polyp 10/2003  . IBS (irritable bowel syndrome)   . RLS (restless legs syndrome)   . Menorrhagia    Past Surgical History  Procedure Laterality Date  . Tubal ligation    . Endometrial ablation  2007    Dr. Landry Mellow at [facility]  . Salivary gland surgery  12-11    LEFT SIDE NODULE REMOVED WAS BENIGN   Allergies  Allergen Reactions  . Other     POLLEN AND DUST   Prior to Admission medications   Medication Sig Start Date End Date Taking? Authorizing Provider  albuterol (PROVENTIL HFA;VENTOLIN HFA) 108 (90 BASE) MCG/ACT inhaler Inhale 2 puffs into the lungs every 6 (six) hours as needed. Shortness of breath   Yes Historical Provider, MD  amLODipine (NORVASC) 5 MG tablet Take 1 tablet (5 mg total) by mouth daily. 04/18/13  Yes Biagio Borg, MD  Linaclotide [facility]) 145 MCG CAPS capsule Take 1 capsule (145 mcg total) by mouth daily. 04/17/13  Yes Biagio Borg, MD  lisinopril (PRINIVIL,ZESTRIL) 20 MG tablet Take 1 tablet (20 mg total) by mouth daily. 04/18/13  Yes Biagio Borg, MD  pantoprazole (PROTONIX) 40 MG tablet Take 40 mg by mouth daily.   Yes Historical Provider, MD   History   Social History  . Marital Status: Divorced    Spouse Name: N/A    Number of Children: 2  . Years of Education: N/A  Occupational History  . Bus driver   .  [place] History Main Topics  . Smoking status: Never Smoker   . Smokeless tobacco: Never Used  . Alcohol Use: No  . Drug Use: No  . Sexual Activity: Yes     Comment: tubal ligation   Other Topics Concern  . Not on file   Social History Narrative   Patient does not get regular exercise      Review of Systems  Constitutional: Negative for fever and chills.  Gastrointestinal: Negative for nausea, vomiting, abdominal pain and diarrhea.  Genitourinary: Positive for frequency and vaginal discharge. Negative for dysuria, urgency and vaginal pain.  Musculoskeletal: Negative for back pain.         Objective:   Physical Exam  Nursing note and vitals reviewed. Constitutional: She is oriented to person, place, and time. She appears well-developed and well-nourished.  HENT:  Head: Normocephalic and atraumatic.  Eyes: Conjunctivae are normal. Pupils are equal, round, and reactive to light.  Neck: Neck supple.  Cardiovascular: Normal rate, regular rhythm and normal heart sounds.  Exam reveals no gallop and no friction rub.   No murmur heard. Pulmonary/Chest: Effort normal and breath sounds normal. She has no wheezes. She has no rales.  Abdominal: Soft. Bowel sounds are normal. She exhibits no distension. There is no tenderness. There is no CVA tenderness.  Neurological: She is alert and oriented to person, place, and time. No cranial nerve deficit.  Skin: Skin is warm and dry.  Psychiatric: She has a normal mood and affect. Her behavior is normal.    Filed Vitals:   11/08/13 1346  BP: 140/96  Pulse: 80  Temp: 98.5 F (36.9 C)  TempSrc: Oral  Resp: 16  Height: 5\' 1"  (1.549 m)  Weight: 160 lb 3.2 oz (72.666 kg)  SpO2: 100%   Results for orders placed in visit on 11/08/13  POCT URINALYSIS DIPSTICK      Result Value Ref Range   Color, UA yellow     Clarity, UA clear     Glucose, UA neg     Bilirubin, UA neg     Ketones, UA neg     Spec Grav, UA 1.015     Blood, UA trace-lysed     pH, UA 7.0     Protein, UA neg     Urobilinogen, UA 0.2     Nitrite, UA neg     Leukocytes, UA Negative    POCT UA - MICROSCOPIC ONLY      Result Value Ref Range   WBC, Ur, HPF, POC neg     RBC, urine, microscopic neg     Bacteria, U Microscopic 2+     Mucus, UA neg     Epithelial cells, urine per micros 0-2     Crystals, Ur, HPF, POC neg     Casts, Ur, LPF, POC neg     Yeast, UA neg    POCT WET PREP WITH KOH      Result Value Ref Range   Trichomonas, UA Negative     Clue Cells Wet Prep HPF POC 3-8     Epithelial Wet Prep HPF POC 6-10     Yeast Wet Prep HPF POC neg     Bacteria Wet  Prep HPF POC 3+     RBC Wet Prep HPF POC neg     WBC Wet Prep HPF POC 0-1     KOH Prep POC Negative  Noted above - on end of menses.      Assessment & Plan:   [name] is a 48 y.o. female Vaginal discharge - Plan: POCT Wet Prep with KOH  Frequent urination - Plan: POCT urinalysis dipstick, POCT UA - Microscopic Only  BV (bacterial vaginosis) - Plan: metroNIDAZOLE (FLAGYL) 500 MG tablet  Reassuring U/a.  supected primarily BV which may be contributing to urinary sx's. Start flagyl, and if urinary sx's persist - rtc for recheck. Sooner if any abd/pelvic pain, or other worsening.  Meds ordered this encounter  Medications  . metroNIDAZOLE (FLAGYL) 500 MG tablet    Sig: Take 1 tablet (500 mg total) by mouth 2 (two) times daily.    Dispense:  14 tablet    Refill:  0   Patient Instructions  Start flagyl as discussed. If urinary symptoms persist with treatment of discharge - return for recheck.  Return to the clinic or go to the nearest emergency room if any of your symptoms worsen or new symptoms occur.    Bacterial Vaginosis Bacterial vaginosis is a vaginal infection that occurs when the normal balance of bacteria in the vagina is disrupted. It results from an overgrowth of certain bacteria. This is the most common vaginal infection in women of childbearing age. Treatment is important to prevent complications, especially in pregnant women, as it can cause a premature delivery. CAUSES  Bacterial vaginosis is caused by an increase in harmful bacteria that are normally present in smaller amounts in the vagina. Several different kinds of bacteria can cause bacterial vaginosis. However, the reason that the condition develops is not fully understood. RISK FACTORS Certain activities or behaviors can put you at an increased risk of developing bacterial vaginosis, including:  Having a new sex partner or multiple sex partners.  Douching.  Using an intrauterine device (IUD) for  contraception. Women do not get bacterial vaginosis from toilet seats, bedding, swimming pools, or contact with objects around them. SIGNS AND SYMPTOMS  Some women with bacterial vaginosis have no signs or symptoms. Common symptoms include:  Grey vaginal discharge.  A fishlike odor with discharge, especially after sexual intercourse.  Itching or burning of the vagina and vulva.  Burning or pain with urination. DIAGNOSIS  Your health care provider will take a medical history and examine the vagina for signs of bacterial vaginosis. A sample of vaginal fluid may be taken. Your health care provider will look at this sample under a microscope to check for bacteria and abnormal cells. A vaginal pH test may also be done.  TREATMENT  Bacterial vaginosis may be treated with antibiotic medicines. These may be given in the form of a pill or a vaginal cream. A second round of antibiotics may be prescribed if the condition comes back after treatment.  HOME CARE INSTRUCTIONS   Only take over-the-counter or prescription medicines as directed by your health care provider.  If antibiotic medicine was prescribed, take it as directed. Make sure you finish it even if you start to feel better.  Do not have sex until treatment is completed.  Tell all sexual partners that you have a vaginal infection. They should see their health care provider and be treated if they have problems, such as a mild rash or itching.  Practice safe sex by using condoms and only having one sex partner. SEEK MEDICAL CARE IF:   Your symptoms are not improving after 3 days of treatment.  You have increased discharge or pain.  You have a  fever. MAKE SURE YOU:   Understand these instructions.  Will watch your condition.  Will get help right away if you are not doing well or get worse. FOR MORE INFORMATION  Centers for Disease Control and Prevention, Division of STD Prevention: [employer] American Sexual Health  Association (ASHA): www.ashastd.org  Document Released: 07/05/2005 Document Revised: 04/25/2013 Document Reviewed: 02/14/2013 [facility] Patient Information 2014 [place].    I personally performed the services described in this documentation, which was scribed in my presence. The recorded information has been reviewed and considered, and addended by me as needed.

## 2013-11-08 NOTE — Patient Instructions (Signed)
Start flagyl as discussed. If urinary symptoms persist with treatment of discharge - return for recheck.  Return to the clinic or go to the nearest emergency room if any of your symptoms worsen or new symptoms occur.    Bacterial Vaginosis Bacterial vaginosis is a vaginal infection that occurs when the normal balance of bacteria in the vagina is disrupted. It results from an overgrowth of certain bacteria. This is the most common vaginal infection in women of childbearing age. Treatment is important to prevent complications, especially in pregnant women, as it can cause a premature delivery. CAUSES  Bacterial vaginosis is caused by an increase in harmful bacteria that are normally present in smaller amounts in the vagina. Several different kinds of bacteria can cause bacterial vaginosis. However, the reason that the condition develops is not fully understood. RISK FACTORS Certain activities or behaviors can put you at an increased risk of developing bacterial vaginosis, including:  Having a new sex partner or multiple sex partners.  Douching.  Using an intrauterine device (IUD) for contraception. Women do not get bacterial vaginosis from toilet seats, bedding, swimming pools, or contact with objects around them. SIGNS AND SYMPTOMS  Some women with bacterial vaginosis have no signs or symptoms. Common symptoms include:  Grey vaginal discharge.  A fishlike odor with discharge, especially after sexual intercourse.  Itching or burning of the vagina and vulva.  Burning or pain with urination. DIAGNOSIS  Your health care provider will take a medical history and examine the vagina for signs of bacterial vaginosis. A sample of vaginal fluid may be taken. Your health care provider will look at this sample under a microscope to check for bacteria and abnormal cells. A vaginal pH test may also be done.  TREATMENT  Bacterial vaginosis may be treated with antibiotic medicines. These may be given in  the form of a pill or a vaginal cream. A second round of antibiotics may be prescribed if the condition comes back after treatment.  HOME CARE INSTRUCTIONS   Only take over-the-counter or prescription medicines as directed by your health care provider.  If antibiotic medicine was prescribed, take it as directed. Make sure you finish it even if you start to feel better.  Do not have sex until treatment is completed.  Tell all sexual partners that you have a vaginal infection. They should see their health care provider and be treated if they have problems, such as a mild rash or itching.  Practice safe sex by using condoms and only having one sex partner. SEEK MEDICAL CARE IF:   Your symptoms are not improving after 3 days of treatment.  You have increased discharge or pain.  You have a fever. MAKE SURE YOU:   Understand these instructions.  Will watch your condition.  Will get help right away if you are not doing well or get worse. FOR MORE INFORMATION  Centers for Disease Control and Prevention, Division of STD Prevention: [employer]raiserFraud[employer]fi American Sexual Health Association (ASHA): www.ashastd.org  Document Released: 07/05/2005 Document Revised: 04/25/2013 Document Reviewed: 02/14/2013 [facility] Patient Information 2014 [place].

## 2013-11-15 ENCOUNTER — Ambulatory Visit (INDEPENDENT_AMBULATORY_CARE_PROVIDER_SITE_OTHER): Payer: BC Managed Care – PPO | Admitting: Women's Health

## 2013-11-15 ENCOUNTER — Other Ambulatory Visit (HOSPITAL_COMMUNITY)
Admission: RE | Admit: 2013-11-15 | Discharge: 2013-11-15 | Disposition: A | Discharge status: Home / Self Care | Payer: BC Managed Care – PPO | Source: Ambulatory Visit | Attending: Gynecology | Admitting: Gynecology

## 2013-11-15 ENCOUNTER — Encounter: Payer: Self-pay | Admitting: Women's Health

## 2013-11-15 VITALS — BP 124/84 | Ht 61.0 in | Wt 162.0 lb

## 2013-11-15 DIAGNOSIS — N898 Other specified noninflammatory disorders of vagina: Secondary | ICD-10-CM

## 2013-11-15 DIAGNOSIS — Z01419 Encounter for gynecological examination (general) (routine) without abnormal findings: Secondary | ICD-10-CM | POA: Insufficient documentation

## 2013-11-15 DIAGNOSIS — B3731 Acute candidiasis of vulva and vagina: Secondary | ICD-10-CM

## 2013-11-15 DIAGNOSIS — B373 Candidiasis of vulva and vagina: Secondary | ICD-10-CM

## 2013-11-15 DIAGNOSIS — N946 Dysmenorrhea, unspecified: Secondary | ICD-10-CM

## 2013-11-15 LAB — WET PREP FOR TRICH, YEAST, CLUE
Clue Cells Wet Prep HPF POC: NONE SEEN
Trich, Wet Prep: NONE SEEN

## 2013-11-15 MED ORDER — FLUCONAZOLE 150 MG PO TABS: 150.0000 mg | ORAL_TABLET | Freq: Once | ORAL | Status: DC

## 2013-11-15 MED ORDER — IBUPROFEN 600 MG PO TABS: 600.0000 mg | ORAL_TABLET | Freq: Three times a day (TID) | ORAL | Status: DC | PRN

## 2013-11-15 NOTE — Progress Notes (Signed)
OVEDA DADAMO 18-Feb-1966 [id number]    History:    Presents for annual exam.  Light monthly cycle/BTL. 3 cm fibroid some dysmenorrhea. Endometrial ablation 2007. Normal Pap and mammogram history. 2010 colonoscopy benign polyps , due for colonoscopy the summer, mother died of colon cancer age 46.Marland Kitchen Hypertension/hypercholesterolemia/IBS managed by primary care.  Past medical history, past surgical history, family history and social history were all reviewed and documented in the EPIC chart. 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 39 year old son lives in [place] doing well.  ROS:  A  ROS was performed and pertinent positives and negatives are included.  Exam:  Filed Vitals:   11/15/13 1107  BP: 124/84    General appearance:  Normal Thyroid:  Symmetrical, normal in size, without palpable masses or nodularity. Respiratory  Auscultation:  Clear without wheezing or rhonchi Cardiovascular  Auscultation:  Regular rate, without rubs, murmurs or gallops  Edema/varicosities:  Not grossly evident Abdominal  Soft,nontender, without masses, guarding or rebound.  Liver/spleen:  No organomegaly noted  Hernia:  None appreciated  Skin  Inspection:  Grossly normal   Breasts: Examined lying and sitting.     Right: Without masses, retractions, discharge or axillary adenopathy.     Left: Without masses, retractions, discharge or axillary adenopathy. Gentitourinary   Inguinal/mons:  Normal without inguinal adenopathy  External genitalia:  Normal  BUS/Urethra/Skene's glands:  Normal  Vagina:  White discharge, wet prep positive for yeast  Cervix:  Normal  Uterus:   normal in size, shape and contour.  Midline and mobile  Adnexa/parametria:     Rt: Without masses or tenderness.   Lt: Without masses or tenderness.  Anus and perineum: Normal  Digital rectal exam: Normal sphincter tone without palpated masses or tenderness  Assessment/Plan:  48 y.o.  SWF [name] for annual exam.   Fibroid uterus with dysmenorrhea Yeast Endometrial ablation BTL  Hypertension/hypercholesterolemia/IBS - primary care manages labs and meds  Plan: Motrin 600 every 8 hours as needed for menstrual cramps. SBE's, continue annual mammogram, calcium rich diet, vitamin D 1000 daily encouraged. Increase regular exercise encouraged. Diflucan 150 times one dose prescription, proper use given and reviewed. Schedule colonoscopy. Condoms encouraged if sexually active. Pap, Pap normal 2013, new screening guidelines reviewed.     [place], 11:49 AM 11/15/2013

## 2013-11-15 NOTE — Patient Instructions (Signed)

## 2013-11-20 ENCOUNTER — Encounter: Payer: Self-pay | Admitting: Gastroenterology

## 2013-11-27 ENCOUNTER — Encounter: Payer: Self-pay | Admitting: Gastroenterology

## 2013-12-06 ENCOUNTER — Ambulatory Visit (INDEPENDENT_AMBULATORY_CARE_PROVIDER_SITE_OTHER): Payer: BC Managed Care – PPO | Admitting: Internal Medicine

## 2013-12-06 ENCOUNTER — Encounter: Payer: Self-pay | Admitting: Internal Medicine

## 2013-12-06 VITALS — BP 152/100 | HR 84 | Temp 98.2°F | Ht 62.0 in | Wt 160.0 lb

## 2013-12-06 DIAGNOSIS — F3289 Other specified depressive episodes: Secondary | ICD-10-CM

## 2013-12-06 DIAGNOSIS — I10 Essential (primary) hypertension: Secondary | ICD-10-CM

## 2013-12-06 DIAGNOSIS — F329 Major depressive disorder, single episode, unspecified: Secondary | ICD-10-CM

## 2013-12-06 DIAGNOSIS — F411 Generalized anxiety disorder: Secondary | ICD-10-CM

## 2013-12-06 MED ORDER — CLONAZEPAM 0.5 MG PO TABS: 0.5000 mg | ORAL_TABLET | Freq: Two times a day (BID) | ORAL | Status: DC | PRN

## 2013-12-06 MED ORDER — LISINOPRIL-HYDROCHLOROTHIAZIDE 20-12.5 MG PO TABS: 1.0000 | ORAL_TABLET | Freq: Every day | ORAL | Status: DC

## 2013-12-06 NOTE — Progress Notes (Signed)
Pre visit review using our clinic review tool, if applicable. No additional management support is needed unless otherwise documented below in the visit note. 

## 2013-12-06 NOTE — Patient Instructions (Addendum)
OK to stop the lisinopril 20 mg per day  Please take all new medication as prescribed - the lisinopril HCT 20-12.5 mg per day  Please take all new medication as prescribed - the clonazepam as needed  Please consider calling Dr Arvil Persons office for counseling  Please continue all other medications as before, and refills have been done if requested. Please have the pharmacy call with any other refills you may need.  Please return in 3 months (september 2015), or sooner if needed, with Lab testing done 3-5 days before (the office will call)
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
## 2013-12-10 NOTE — Assessment & Plan Note (Signed)
For klonopin bid prn,  to f/u any worsening symptoms or concerns 

## 2013-12-10 NOTE — Assessment & Plan Note (Signed)
Mild uncontrolled, for change lisinopril to lisin-HCT qd, cont all other med,  to f/u any worsening symptoms or concerns BP Readings from Last 3 Encounters:  12/06/13 152/100  11/15/13 124/84  11/08/13 140/96

## 2013-12-10 NOTE — Assessment & Plan Note (Signed)
Declines counseling referral or psychiatry, verified nonsuicidal

## 2014-01-03 ENCOUNTER — Ambulatory Visit (INDEPENDENT_AMBULATORY_CARE_PROVIDER_SITE_OTHER): Payer: Self-pay | Admitting: Emergency Medicine

## 2014-01-03 VITALS — BP 132/80 | HR 84 | Temp 98.3°F | Resp 16 | Ht 62.0 in | Wt 158.6 lb

## 2014-01-03 DIAGNOSIS — Z0289 Encounter for other administrative examinations: Secondary | ICD-10-CM

## 2014-01-03 DIAGNOSIS — Z0000 Encounter for general adult medical examination without abnormal findings: Secondary | ICD-10-CM

## 2014-01-03 NOTE — Progress Notes (Signed)
   Subjective:    Patient ID: [name], female    DOB: 1965-07-30, 48 y.o.   MRN: [id number]  HPI 48 year old female pt presents for DOT physical for QRT. 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 Her PCP is [name]. Hx of hypertension.    Review of Systems     Objective:   Physical Exam HEENT exam is unremarkable. Her neck is supple. Chest clear heart regular rate no murmurs abdomen soft nontender extremities without edema.        Assessment & Plan:  Patient is a family history of colon cancer. She qualifies for a one-year DOT card she has a history of hypertension controlled with medication. She also is restricted to lens. Patient has been diagnosed with hypersomnia in the past. I will change her DOT card to a three-month card and have her check with her doctor so she can have her sleep study done prior to her DOT card expiring

## 2014-01-03 NOTE — Progress Notes (Signed)
Sp gr.-1.010 protein-neg, glucose-neg, blood-neg

## 2014-01-10 ENCOUNTER — Ambulatory Visit (AMBULATORY_SURGERY_CENTER): Payer: Self-pay | Admitting: *Deleted

## 2014-01-10 VITALS — Ht 62.0 in | Wt 158.0 lb

## 2014-01-10 DIAGNOSIS — Z800 Family history of malignant neoplasm of digestive organs: Secondary | ICD-10-CM

## 2014-01-10 MED ORDER — MOVIPREP 100 G PO SOLR: ORAL | Status: DC

## 2014-01-10 NOTE — Progress Notes (Signed)
No egg or soy allergy  No diet medications taken  No trouble with anesthesia and pt denies being told she is difficult to intubate  Registered in [facility]

## 2014-01-11 ENCOUNTER — Encounter: Payer: Self-pay | Admitting: Gastroenterology

## 2014-01-24 ENCOUNTER — Ambulatory Visit (AMBULATORY_SURGERY_CENTER): Payer: BC Managed Care – PPO | Admitting: Gastroenterology

## 2014-01-24 ENCOUNTER — Encounter: Payer: Self-pay | Admitting: Gastroenterology

## 2014-01-24 VITALS — BP 141/94 | HR 62 | Temp 97.9°F | Resp 12 | Ht 62.0 in | Wt 158.0 lb

## 2014-01-24 DIAGNOSIS — Z800 Family history of malignant neoplasm of digestive organs: Secondary | ICD-10-CM

## 2014-01-24 DIAGNOSIS — Z1211 Encounter for screening for malignant neoplasm of colon: Secondary | ICD-10-CM

## 2014-01-24 MED ORDER — SODIUM CHLORIDE 0.9 % IV SOLN: 500.0000 mL | INTRAVENOUS | Status: DC

## 2014-01-24 NOTE — Patient Instructions (Signed)

## 2014-01-24 NOTE — Op Note (Signed)
[place]  [employer] [place], [id number]   COLONOSCOPY PROCEDURE REPORT  PATIENT: [name], [name]  MR#: [id number] BIRTHDATE: [birth year] , 26  yrs. old GENDER: Female ENDOSCOPIST: Ladene Artist, MD, [facility] PROCEDURE DATE:  01/24/2014 PROCEDURE:   Colonoscopy, screening First Screening Colonoscopy - Avg.  risk and is 50 yrs.  old or older - No.  Prior Negative Screening - Now for repeat screening. Above average risk  History of Adenoma - Now for follow-up colonoscopy & has been > or = to 3 yrs.  N/A  Polyps Removed Today? No.  Recommend repeat exam, <10 yrs? Yes.  High risk (family or personal hx). ASA CLASS:   Class III INDICATIONS:elevated risk screening and Patient's immediate family history of colon cancer. MEDICATIONS: MAC sedation, administered by CRNA and propofol (Diprivan) 200mg  IV DESCRIPTION OF PROCEDURE:   After the risks benefits and alternatives of the procedure were thoroughly explained, informed consent was obtained.  A digital rectal exam revealed no abnormalities of the rectum.   The LB [id number]  endoscope was introduced through the anus and advanced to the cecum, which was identified by both the appendix and ileocecal valve. No adverse events experienced.   The quality of the prep was excellent, using MoviPrep  The instrument was then slowly withdrawn as the colon was fully examined.  COLON FINDINGS: A normal appearing cecum, ileocecal valve, and appendiceal orifice were identified.  The ascending, hepatic flexure, transverse, splenic flexure, descending, sigmoid colon and rectum appeared unremarkable.  No polyps or cancers were seen. Retroflexed views revealed small internal hemorrhoids. The time to cecum=3 minutes 41 seconds.  Withdrawal time=9 minutes 17 seconds. The scope was withdrawn and the procedure completed.  COMPLICATIONS: There were no complications.  ENDOSCOPIC IMPRESSION: 1.  Normal colon 2.  Small internal  hemorrhoids  RECOMMENDATIONS: 1.  Repeat Colonoscopy in 5 years.  eSigned:  Ladene Artist, MD, [facility] 01/24/2014 10:43 AM

## 2014-01-24 NOTE — Progress Notes (Signed)
Report to PACU, RN, vss, BBS= Clear.  

## 2014-01-25 ENCOUNTER — Telehealth: Payer: Self-pay

## 2014-01-25 NOTE — Telephone Encounter (Signed)
  Follow up Call-  Call back number 01/24/2014  Post procedure Call Back phone  # [phone]  Permission to leave phone message Yes     Patient questions:  Do you have a fever, pain , or abdominal swelling? No. Pain Score  0 *  Have you tolerated food without any problems? Yes.    Have you been able to return to your normal activities? Yes.    Do you have any questions about your discharge instructions: Diet   No. Medications  No. Follow up visit  No.  Do you have questions or concerns about your Care? No.  Actions: * If pain score is 4 or above: No action needed, pain <4.   No problems per the pt. Maw

## 2014-03-20 ENCOUNTER — Encounter: Payer: Self-pay | Admitting: Internal Medicine

## 2014-03-20 ENCOUNTER — Ambulatory Visit (INDEPENDENT_AMBULATORY_CARE_PROVIDER_SITE_OTHER): Payer: BC Managed Care – PPO | Admitting: Internal Medicine

## 2014-03-20 VITALS — BP 120/86 | HR 88 | Temp 98.7°F | Wt 163.5 lb

## 2014-03-20 DIAGNOSIS — R0609 Other forms of dyspnea: Secondary | ICD-10-CM

## 2014-03-20 DIAGNOSIS — R0600 Dyspnea, unspecified: Secondary | ICD-10-CM

## 2014-03-20 DIAGNOSIS — I10 Essential (primary) hypertension: Secondary | ICD-10-CM

## 2014-03-20 DIAGNOSIS — J069 Acute upper respiratory infection, unspecified: Secondary | ICD-10-CM

## 2014-03-20 DIAGNOSIS — R0989 Other specified symptoms and signs involving the circulatory and respiratory systems: Secondary | ICD-10-CM

## 2014-03-20 MED ORDER — AZITHROMYCIN 250 MG PO TABS: ORAL_TABLET | ORAL | Status: DC

## 2014-03-20 MED ORDER — FLUCONAZOLE 150 MG PO TABS: ORAL_TABLET | ORAL | Status: DC

## 2014-03-20 MED ORDER — ALBUTEROL SULFATE HFA 108 (90 BASE) MCG/ACT IN AERS: 2.0000 | INHALATION_SPRAY | Freq: Four times a day (QID) | RESPIRATORY_TRACT | Status: DC | PRN

## 2014-03-20 MED ORDER — HYDROCODONE-HOMATROPINE 5-1.5 MG/5ML PO SYRP: 5.0000 mL | ORAL_SOLUTION | Freq: Four times a day (QID) | ORAL | Status: DC | PRN

## 2014-03-20 NOTE — Progress Notes (Signed)
Subjective:    Patient ID: [name], female    DOB: 1965-09-10, 48 y.o.   MRN: [id number]  HPI     Here with 2-3 days acute onset fever, facial pain, pressure, headache, general weakness and malaise, and greenish d/c, with mild ST and cough, but pt denies chest pain, wheezing, increased sob or doe, orthopnea, PND, increased LE swelling, palpitations, dizziness or syncope., except for onset mild sob/tightness since last PM Past Medical History  Diagnosis Date  . Anxiety   . Depression   . Hyperlipidemia   . Hypertension   . Obesity   . Allergic rhinitis   . GERD (gastroesophageal reflux disease)   . Hyperplastic colon polyp 10/2003  . IBS (irritable bowel syndrome)   . RLS (restless legs syndrome)   . Menorrhagia   . Allergy    Past Surgical History  Procedure Laterality Date  . Tubal ligation    . Endometrial ablation  2007    Dr. Landry Mellow at [facility]  . Salivary gland surgery  12-11    LEFT SIDE NODULE REMOVED WAS BENIGN  . Colonoscopy      reports that she has never smoked. She has never used smokeless tobacco. She reports that she drinks alcohol. She reports that she does not use illicit drugs. family history includes Cancer in her mother; Colon polyps in her mother; Heart attack in her maternal grandfather; Hyperlipidemia in her other; Hypertension in her other. There is no history of Esophageal cancer, Rectal cancer, or Stomach cancer. Allergies  Allergen Reactions  . Other     POLLEN AND DUST   Current Outpatient Prescriptions on File Prior to Visit  Medication Sig Dispense Refill  . amLODipine (NORVASC) 5 MG tablet Take 1 tablet (5 mg total) by mouth daily.  90 tablet  3  . clonazePAM (KLONOPIN) 0.5 MG tablet Take 1 tablet (0.5 mg total) by mouth 2 (two) times daily as needed for anxiety.  60 tablet  1  . ibuprofen (ADVIL,MOTRIN) 600 MG tablet Take 1 tablet (600 mg total) by mouth every 8 (eight) hours as needed.  60 tablet  1  . Linaclotide (LINZESS) 145 MCG CAPS  capsule Take 1 capsule (145 mcg total) by mouth daily.  90 capsule  3  . lisinopril-hydrochlorothiazide (ZESTORETIC) 20-12.5 MG per tablet Take 1 tablet by mouth daily.  90 tablet  3  . pantoprazole (PROTONIX) 40 MG tablet Take 40 mg by mouth daily.      . [DISCONTINUED] citalopram (CELEXA) 10 MG tablet 3 tabs by mouth once per day  270 tablet  3  . [DISCONTINUED] fexofenadine (ALLEGRA) 180 MG tablet Take 1 tablet (180 mg total) by mouth daily.  30 tablet  11  . [DISCONTINUED] fluticasone (FLONASE) 50 MCG/ACT nasal spray 2 sprays by Nasal route daily.  16 g  2   No current facility-administered medications on file prior to visit.   Review of Systems  Constitutional: Negative for unusual diaphoresis or other sweats  HENT: Negative for ringing in ear Eyes: Negative for double vision or worsening visual disturbance.  Respiratory: Negative for choking and stridor.   Gastrointestinal: Negative for vomiting or other signifcant bowel change Genitourinary: Negative for hematuria or decreased urine volume.  Musculoskeletal: Negative for other MSK pain or swelling Skin: Negative for color change and worsening wound.  Neurological: Negative for tremors and numbness other than noted  Psychiatric/Behavioral: Negative for decreased concentration or agitation other than above       Objective:  Physical Exam BP 120/86  Pulse 88  Temp(Src) 98.7 F (37.1 C) (Oral)  Wt 163 lb 8 oz (74.163 kg)  SpO2 97% VS noted, mild ill Constitutional: Pt appears well-developed, well-nourished.  HENT: Head: NCAT.  Right Ear: External ear normal.  Left Ear: External ear normal.  Eyes: . Pupils are equal, round, and reactive to light. Conjunctivae and EOM are normal Neck: Normal range of mot Bilat tm's with mild erythema.  Max sinus areas non tender.  Pharynx with mild erythema, no exudateion. Neck supple.  Cardiovascular: Normal rate and regular rhythm.   Pulmonary/Chest: Effort normal and breath sounds mild  decreased, no frank wheeze or rales.  Neurological: Pt is alert. Not confused , motor grossly intact Skin: Skin is warm. No rash Psychiatric: Pt behavior is normal. No agitation.     Assessment & Plan:

## 2014-03-20 NOTE — Progress Notes (Signed)
Pre visit review using our clinic review tool, if applicable. No additional management support is needed unless otherwise documented below in the visit note. 

## 2014-03-20 NOTE — Patient Instructions (Signed)
Please take all new medication as prescribed - the antibiotic, cough medicine, and diflucan if needed  Please continue all other medications as before, and refills have been done if requested - the inhaler  Please have the pharmacy call with any other refills you may need.  Please keep your appointments with your specialists as you may have planned

## 2014-03-21 ENCOUNTER — Ambulatory Visit: Payer: BC Managed Care – PPO

## 2014-03-21 ENCOUNTER — Other Ambulatory Visit (INDEPENDENT_AMBULATORY_CARE_PROVIDER_SITE_OTHER): Payer: BC Managed Care – PPO

## 2014-03-21 ENCOUNTER — Other Ambulatory Visit: Payer: BC Managed Care – PPO

## 2014-03-21 DIAGNOSIS — D649 Anemia, unspecified: Secondary | ICD-10-CM

## 2014-03-21 DIAGNOSIS — Z0000 Encounter for general adult medical examination without abnormal findings: Secondary | ICD-10-CM

## 2014-03-21 LAB — CBC WITH DIFFERENTIAL/PLATELET
BASOS PCT: 0.3 % (ref 0.0–3.0)
Basophils Absolute: 0 10*3/uL (ref 0.0–0.1)
EOS PCT: 0.7 % (ref 0.0–5.0)
Eosinophils Absolute: 0.1 10*3/uL (ref 0.0–0.7)
HEMATOCRIT: 35.2 % — AB (ref 36.0–46.0)
Hemoglobin: 11.8 g/dL — ABNORMAL LOW (ref 12.0–15.0)
LYMPHS ABS: 2.1 10*3/uL (ref 0.7–4.0)
Lymphocytes Relative: 26.3 % (ref 12.0–46.0)
MCHC: 33.4 g/dL (ref 30.0–36.0)
MCV: 92.5 fl (ref 78.0–100.0)
MONO ABS: 0.8 10*3/uL (ref 0.1–1.0)
Monocytes Relative: 9.9 % (ref 3.0–12.0)
NEUTROS ABS: 5.1 10*3/uL (ref 1.4–7.7)
Neutrophils Relative %: 62.8 % (ref 43.0–77.0)
Platelets: 195 10*3/uL (ref 150.0–400.0)
RBC: 3.81 Mil/uL — ABNORMAL LOW (ref 3.87–5.11)
RDW: 14.5 % (ref 11.5–15.5)
WBC: 8.1 10*3/uL (ref 4.0–10.5)

## 2014-03-21 LAB — VITAMIN B12: Vitamin B-12: 311 pg/mL (ref 211–911)

## 2014-03-21 LAB — LIPID PANEL
Cholesterol: 176 mg/dL (ref 0–200)
HDL: 35.5 mg/dL — AB (ref >39.00)
LDL Cholesterol: 126 mg/dL — ABNORMAL HIGH (ref 0–99)
NonHDL: 140.5
TRIGLYCERIDES: 72 mg/dL (ref 0.0–149.0)
Total CHOL/HDL Ratio: 5
VLDL: 14.4 mg/dL (ref 0.0–40.0)

## 2014-03-21 LAB — URINALYSIS, ROUTINE W REFLEX MICROSCOPIC
BILIRUBIN URINE: NEGATIVE
Ketones, ur: NEGATIVE
Leukocytes, UA: NEGATIVE
NITRITE: NEGATIVE
PH: 5.5 (ref 5.0–8.0)
Specific Gravity, Urine: 1.02 (ref 1.000–1.030)
TOTAL PROTEIN, URINE-UPE24: NEGATIVE
Urine Glucose: NEGATIVE
Urobilinogen, UA: 0.2 (ref 0.0–1.0)

## 2014-03-21 LAB — BASIC METABOLIC PANEL
BUN: 9 mg/dL (ref 6–23)
CO2: 25 mEq/L (ref 19–32)
Calcium: 9.2 mg/dL (ref 8.4–10.5)
Chloride: 105 mEq/L (ref 96–112)
Creatinine, Ser: 0.8 mg/dL (ref 0.4–1.2)
GFR: 103.91 mL/min (ref >60.00)
Glucose, Bld: 80 mg/dL (ref 70–99)
POTASSIUM: 3.6 meq/L (ref 3.5–5.1)
SODIUM: 138 meq/L (ref 135–145)

## 2014-03-21 LAB — TSH: TSH: 0.6 u[IU]/mL (ref 0.35–4.50)

## 2014-03-21 LAB — HEPATIC FUNCTION PANEL
ALBUMIN: 3.8 g/dL (ref 3.5–5.2)
ALT: 7 U/L (ref 0–35)
AST: 16 U/L (ref 0–37)
Alkaline Phosphatase: 49 U/L (ref 39–117)
Bilirubin, Direct: 0.1 mg/dL (ref 0.0–0.3)
TOTAL PROTEIN: 7.3 g/dL (ref 6.0–8.3)
Total Bilirubin: 0.7 mg/dL (ref 0.2–1.2)

## 2014-03-21 LAB — IBC PANEL
Iron: 54 ug/dL (ref 42–145)
SATURATION RATIOS: 18.7 % — AB (ref 20.0–50.0)
Transferrin: 205.9 mg/dL — ABNORMAL LOW (ref 212.0–360.0)

## 2014-03-24 NOTE — Assessment & Plan Note (Signed)
C/w prob mild bronchospasm, for proventil prn,  to f/u any worsening symptoms or concerns

## 2014-03-24 NOTE — Assessment & Plan Note (Signed)
Mild to mod, for antibx course,  to f/u any worsening symptoms or concerns 

## 2014-03-24 NOTE — Assessment & Plan Note (Signed)
stable overall by history and exam, recent data reviewed with pt, and pt to continue medical treatment as before,  to f/u any worsening symptoms or concerns BP Readings from Last 3 Encounters:  03/20/14 120/86  01/24/14 141/94  01/03/14 132/80

## 2014-03-27 ENCOUNTER — Encounter: Payer: Self-pay | Admitting: Internal Medicine

## 2014-03-27 ENCOUNTER — Ambulatory Visit (INDEPENDENT_AMBULATORY_CARE_PROVIDER_SITE_OTHER): Payer: BC Managed Care – PPO | Admitting: Internal Medicine

## 2014-03-27 VITALS — BP 132/86 | HR 81 | Temp 98.3°F | Wt 162.5 lb

## 2014-03-27 DIAGNOSIS — Z23 Encounter for immunization: Secondary | ICD-10-CM

## 2014-03-27 DIAGNOSIS — Z0000 Encounter for general adult medical examination without abnormal findings: Secondary | ICD-10-CM

## 2014-03-27 DIAGNOSIS — N946 Dysmenorrhea, unspecified: Secondary | ICD-10-CM | POA: Insufficient documentation

## 2014-03-27 DIAGNOSIS — E785 Hyperlipidemia, unspecified: Secondary | ICD-10-CM

## 2014-03-27 MED ORDER — NAPROXEN 500 MG PO TABS: 500.0000 mg | ORAL_TABLET | Freq: Two times a day (BID) | ORAL | Status: DC

## 2014-03-27 NOTE — Progress Notes (Signed)
Pre visit review using our clinic review tool, if applicable. No additional management support is needed unless otherwise documented below in the visit note. 

## 2014-03-27 NOTE — Assessment & Plan Note (Signed)
Cont lower chol diet, delcines statin trial

## 2014-03-27 NOTE — Addendum Note (Signed)
Addended by: Sharon Seller B on: 03/27/2014 10:37 AM   Modules accepted: Orders

## 2014-03-27 NOTE — Assessment & Plan Note (Signed)

## 2014-03-27 NOTE — Progress Notes (Signed)
   Subjective:    Patient ID: [name], female    DOB: [birth year], 48 y.o.   MRN: [id number]  HPI  Here for wellness and f/u;  Overall doing ok;  Pt denies CP, worsening SOB, DOE, wheezing, orthopnea, PND, worsening LE edema, palpitations, dizziness or syncope.  Pt denies neurological change such as new headache, facial or extremity weakness.  Pt denies polydipsia, polyuria, or low sugar symptoms. Pt states overall good compliance with treatment and medications, good tolerability, and has been trying to follow lower cholesterol diet.  Pt denies worsening depressive symptoms, suicidal ideation or panic. No fever, night sweats, wt loss, loss of appetite, or other constitutional symptoms.  Pt states good ability with ADL's, has low fall risk, home safety reviewed and adequate, no other significant changes in hearing or vision, and only occasionally active with exercise.  Has ongoing menstrual cramps, s/p uterine ablation over 5 yrs. 600 mg ibuprofen not working well enough for menstrual cramps recent.   Review of Systems     Objective:   Physical Exam        Assessment & Plan:

## 2014-03-27 NOTE — Patient Instructions (Signed)
You had the flu shot today  Please take all new medication as prescribed - the naproxen for pain  Please continue all other medications as before, and refills have been done if requested.  Please have the pharmacy call with any other refills you may need.  Please continue your efforts at being more active, low cholesterol diet, and weight control.  You are otherwise up to date with prevention measures today.  Please keep your appointments with your specialists as you may have planned  Please return in 1 year for your yearly visit, or sooner if needed, with Lab testing done 3-5 days before

## 2014-03-27 NOTE — Assessment & Plan Note (Signed)
For naproxn bid prn,  to f/u any worsening symptoms or concerns

## 2014-04-19 ENCOUNTER — Other Ambulatory Visit: Payer: Self-pay | Admitting: Internal Medicine

## 2014-04-19 MED ORDER — CLONAZEPAM 0.5 MG PO TABS: 0.5000 mg | ORAL_TABLET | Freq: Two times a day (BID) | ORAL | Status: DC | PRN

## 2014-04-19 MED ORDER — LINACLOTIDE 145 MCG PO CAPS: 145.0000 ug | ORAL_CAPSULE | Freq: Every day | ORAL | Status: DC

## 2014-04-19 NOTE — Telephone Encounter (Signed)
Done hardcopy to robin  

## 2014-04-19 NOTE — Telephone Encounter (Signed)
Called the patient back and she only needs a refill on Linzess not a PA.  Will send in now for her .  She iis also in need of clonazepam too and will send request to PCP.

## 2014-04-19 NOTE — Telephone Encounter (Signed)
Patient is out of linzess. She is waiting on PA to be processed through.  She was wanting samples I told her I didn't think we had any.  Please advise.

## 2014-04-19 NOTE — Telephone Encounter (Signed)
Faxed hardcopy for Clonazepam to [facility] Dr. Letta Kocher [place]

## 2014-04-24 ENCOUNTER — Other Ambulatory Visit: Payer: Self-pay

## 2014-04-24 DIAGNOSIS — Z1239 Encounter for other screening for malignant neoplasm of breast: Secondary | ICD-10-CM

## 2014-05-20 ENCOUNTER — Encounter: Payer: Self-pay | Admitting: Internal Medicine

## 2014-05-25 ENCOUNTER — Other Ambulatory Visit: Payer: Self-pay | Admitting: Internal Medicine

## 2014-06-03 ENCOUNTER — Other Ambulatory Visit: Payer: Self-pay

## 2014-06-03 DIAGNOSIS — Z1231 Encounter for screening mammogram for malignant neoplasm of breast: Secondary | ICD-10-CM

## 2014-06-05 ENCOUNTER — Ambulatory Visit
Admission: RE | Admit: 2014-06-05 | Discharge: 2014-06-05 | Disposition: A | Discharge status: Home / Self Care | Payer: BC Managed Care – PPO | Source: Ambulatory Visit

## 2014-06-05 DIAGNOSIS — Z1231 Encounter for screening mammogram for malignant neoplasm of breast: Secondary | ICD-10-CM

## 2014-06-06 ENCOUNTER — Encounter: Payer: Self-pay | Admitting: Women's Health

## 2014-06-19 ENCOUNTER — Encounter: Payer: Self-pay | Admitting: Internal Medicine

## 2014-06-19 ENCOUNTER — Ambulatory Visit (INDEPENDENT_AMBULATORY_CARE_PROVIDER_SITE_OTHER): Payer: BC Managed Care – PPO | Admitting: Internal Medicine

## 2014-06-19 VITALS — BP 134/88 | HR 99 | Temp 98.5°F | Ht 61.5 in | Wt 162.4 lb

## 2014-06-19 DIAGNOSIS — J209 Acute bronchitis, unspecified: Secondary | ICD-10-CM

## 2014-06-19 DIAGNOSIS — J310 Chronic rhinitis: Secondary | ICD-10-CM | POA: Insufficient documentation

## 2014-06-19 DIAGNOSIS — F329 Major depressive disorder, single episode, unspecified: Secondary | ICD-10-CM

## 2014-06-19 DIAGNOSIS — F32A Depression, unspecified: Secondary | ICD-10-CM

## 2014-06-19 DIAGNOSIS — I10 Essential (primary) hypertension: Secondary | ICD-10-CM

## 2014-06-19 MED ORDER — LEVOFLOXACIN 250 MG PO TABS: 250.0000 mg | ORAL_TABLET | Freq: Every day | ORAL | Status: DC

## 2014-06-19 MED ORDER — HYDROCODONE-HOMATROPINE 5-1.5 MG/5ML PO SYRP: 5.0000 mL | ORAL_SOLUTION | Freq: Four times a day (QID) | ORAL | Status: DC | PRN

## 2014-06-19 NOTE — Patient Instructions (Signed)
Please take all new medication as prescribed - the antibiotic and cough medicine  Please continue all other medications as before, and refills have been done if requested.  Please have the pharmacy call with any other refills you may need.  Please keep your appointments with your specialists as you may have planned  You are given the work note today

## 2014-06-19 NOTE — Progress Notes (Signed)
Subjective:    Patient ID: [name], female    DOB: [birth year], 48 y.o.   MRN: [id number]  HPI Here with acute onset mild to mod 2-3 days ST, HA, general weakness and malaise, with prod cough greenish sputum, but Pt denies chest pain, increased sob or doe, wheezing, orthopnea, PND, increased LE swelling, palpitations, dizziness or syncope. Pt denies new neurological symptoms such as new headache, or facial or extremity weakness or numbness   Pt denies polydipsia, polyuria, Denies worsening depressive symptoms, suicidal ideation, or panic Past Medical History  Diagnosis Date  . Anxiety   . Depression   . Hyperlipidemia   . Hypertension   . Obesity   . Allergic rhinitis   . GERD (gastroesophageal reflux disease)   . Hyperplastic colon polyp 10/2003  . IBS (irritable bowel syndrome)   . RLS (restless legs syndrome)   . Menorrhagia   . Allergy    Past Surgical History  Procedure Laterality Date  . Tubal ligation    . Endometrial ablation  2007    Dr. Landry Mellow at [facility]  . Salivary gland surgery  12-11    LEFT SIDE NODULE REMOVED WAS BENIGN  . Colonoscopy      reports that she has never smoked. She has never used smokeless tobacco. She reports that she drinks alcohol. She reports that she does not use illicit drugs. family history includes Cancer in her mother; Colon polyps in her mother; Heart attack in her maternal grandfather; Hyperlipidemia in her other; Hypertension in her other. There is no history of Esophageal cancer, Rectal cancer, or Stomach cancer. Allergies  Allergen Reactions  . Other     POLLEN AND DUST   Current Outpatient Prescriptions on File Prior to Visit  Medication Sig Dispense Refill  . albuterol (PROVENTIL HFA;VENTOLIN HFA) 108 (90 BASE) MCG/ACT inhaler Inhale 2 puffs into the lungs every 6 (six) hours as needed. Shortness of breath 1 Inhaler 5  . amLODipine (NORVASC) 5 MG tablet TAKE ONE TABLET BY MOUTH ONCE DAILY 90 tablet 3  . clonazePAM (KLONOPIN)  0.5 MG tablet Take 1 tablet (0.5 mg total) by mouth 2 (two) times daily as needed for anxiety. 60 tablet 1  . Linaclotide (LINZESS) 145 MCG CAPS capsule Take 1 capsule (145 mcg total) by mouth daily. 30 capsule 11  . lisinopril-hydrochlorothiazide (ZESTORETIC) 20-12.5 MG per tablet Take 1 tablet by mouth daily. 90 tablet 3  . naproxen (NAPROSYN) 500 MG tablet Take 1 tablet (500 mg total) by mouth 2 (two) times daily with a meal. As needed for pain 60 tablet 3  . pantoprazole (PROTONIX) 40 MG tablet Take 40 mg by mouth daily.    . [DISCONTINUED] citalopram (CELEXA) 10 MG tablet 3 tabs by mouth once per day 270 tablet 3  . [DISCONTINUED] fexofenadine (ALLEGRA) 180 MG tablet Take 1 tablet (180 mg total) by mouth daily. 30 tablet 11  . [DISCONTINUED] fluticasone (FLONASE) 50 MCG/ACT nasal spray 2 sprays by Nasal route daily. 16 g 2   No current facility-administered medications on file prior to visit.   Review of Systems  Constitutional: Negative for unusual diaphoresis or other sweats  HENT: Negative for ringing in ear Eyes: Negative for double vision or worsening visual disturbance.  Respiratory: Negative for choking and stridor.   Gastrointestinal: Negative for vomiting or other signifcant bowel change Genitourinary: Negative for hematuria or decreased urine volume.  Musculoskeletal: Negative for other MSK pain or swelling Skin: Negative for color change and worsening wound.  Neurological: Negative for tremors and numbness other than noted  Psychiatric/Behavioral: Negative for decreased concentration or agitation other than above       Objective:   Physical Exam BP 134/88 mmHg  Pulse 99  Temp(Src) 98.5 F (36.9 C) (Oral)  Ht 5' 1.5" (1.562 m)  Wt 162 lb 6 oz (73.653 kg)  BMI 30.19 kg/m2  SpO2 95% VS noted, mild ill Constitutional: Pt appears well-developed, well-nourished.  HENT: Head: NCAT.  Right Ear: External ear normal.  Left Ear: External ear normal.  Eyes: . Pupils are  equal, round, and reactive to light. Conjunctivae and EOM are normal Neck: Normal range of motion. Neck supple.  Bilat tm's with mild erythema.  Max sinus areas non tender.  Pharynx with mild erythema, no exudate Cardiovascular: Normal rate and regular rhythm.   Pulmonary/Chest: Effort normal and breath sounds normal.  - no rales or wheezing Neurological: Pt is alert. Not confused , motor grossly intact Skin: Skin is warm. No rash Psychiatric: Pt behavior is normal. No agitation. not depressed affect    Assessment & Plan:

## 2014-06-19 NOTE — Progress Notes (Signed)
Pre visit review using our clinic review tool, if applicable. No additional management support is needed unless otherwise documented below in the visit note. 

## 2014-06-22 NOTE — Assessment & Plan Note (Signed)
Mild to mod, for antibx course,  to f/u any worsening symptoms or concerns 

## 2014-06-22 NOTE — Assessment & Plan Note (Signed)
stable overall by history and exam, recent data reviewed with pt, and pt to continue medical treatment as before,  to f/u any worsening symptoms or concerns BP Readings from Last 3 Encounters:  06/19/14 134/88  03/27/14 132/86  03/20/14 120/86

## 2014-06-22 NOTE — Assessment & Plan Note (Signed)
stable overall by history and exam, and pt to continue medical treatment as before,  to f/u any worsening symptoms or concerns 

## 2014-07-05 ENCOUNTER — Telehealth: Payer: Self-pay | Admitting: *Deleted

## 2014-07-05 NOTE — Telephone Encounter (Signed)
Very sorry, but there is no prescription tx for dry mouth, only OTC. thanks

## 2014-07-05 NOTE — Telephone Encounter (Signed)
Left msg on triage stating wanting g md to rx something for dry mouth. Been using otc products but not seem to be working. Pt thinks her meds is causing dry moth...[name]

## 2014-07-05 NOTE — Telephone Encounter (Signed)
Notified pt with md response.../lmb 

## 2014-09-18 ENCOUNTER — Ambulatory Visit: Payer: BC Managed Care – PPO | Admitting: Internal Medicine

## 2014-10-11 ENCOUNTER — Other Ambulatory Visit: Payer: Self-pay | Admitting: Internal Medicine

## 2014-11-01 ENCOUNTER — Ambulatory Visit (INDEPENDENT_AMBULATORY_CARE_PROVIDER_SITE_OTHER): Payer: BC Managed Care – PPO | Admitting: Internal Medicine

## 2014-11-01 ENCOUNTER — Encounter: Payer: Self-pay | Admitting: Internal Medicine

## 2014-11-01 VITALS — BP 138/88 | HR 83 | Temp 98.6°F | Resp 18 | Ht 61.5 in | Wt 163.1 lb

## 2014-11-01 DIAGNOSIS — F32A Depression, unspecified: Secondary | ICD-10-CM

## 2014-11-01 DIAGNOSIS — F411 Generalized anxiety disorder: Secondary | ICD-10-CM

## 2014-11-01 DIAGNOSIS — F329 Major depressive disorder, single episode, unspecified: Secondary | ICD-10-CM | POA: Diagnosis not present

## 2014-11-01 DIAGNOSIS — I10 Essential (primary) hypertension: Secondary | ICD-10-CM

## 2014-11-01 DIAGNOSIS — Z0189 Encounter for other specified special examinations: Secondary | ICD-10-CM | POA: Diagnosis not present

## 2014-11-01 DIAGNOSIS — Z0000 Encounter for general adult medical examination without abnormal findings: Secondary | ICD-10-CM

## 2014-11-01 MED ORDER — CLONAZEPAM 0.5 MG PO TABS: 0.5000 mg | ORAL_TABLET | Freq: Two times a day (BID) | ORAL | Status: DC | PRN

## 2014-11-01 MED ORDER — CITALOPRAM HYDROBROMIDE 20 MG PO TABS: 20.0000 mg | ORAL_TABLET | Freq: Every day | ORAL | Status: DC

## 2014-11-01 NOTE — Patient Instructions (Addendum)
Please take all new medication as prescribed - the celexa  Please continue all other medications as before, and refills have been done if requested.  Please have the pharmacy call with any other refills you may need.  Please keep your appointments with your specialists as you may have planned  Please return in 6 months, or sooner if needed, with Lab testing done 3-5 days before

## 2014-11-01 NOTE — Progress Notes (Signed)
Subjective:    Patient ID: [name], female    DOB: 1966-02-20, 49 y.o.   MRN: [id number]  HPI   Here to f/u; overall doing ok,  Pt denies chest pain, increasing sob or doe, wheezing, orthopnea, PND, increased LE swelling, palpitations, dizziness or syncope.  Pt denies new neurological symptoms such as new headache, or facial or extremity weakness or numbness.  Pt denies polydipsia, polyuria, or low sugar episode.   Pt denies new neurological symptoms such as new headache, or facial or extremity weakness or numbness.   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 Son had severe rxn to spice with emotional illness since then    Has had mild to mod  worsening depressive symptoms, but no suicidal ideation, or panic (but near panic with palpitations). Not had to go to ER.  Does not want lexapro due to chance of wt gain and sleepiness.   Past Medical History  Diagnosis Date  . Anxiety   . Depression   . Hyperlipidemia   . Hypertension   . Obesity   . Allergic rhinitis   . GERD (gastroesophageal reflux disease)   . Hyperplastic colon polyp 10/2003  . IBS (irritable bowel syndrome)   . RLS (restless legs syndrome)   . Menorrhagia   . Allergy    Past Surgical History  Procedure Laterality Date  . Tubal ligation    . Endometrial ablation  2007    Dr. Landry Mellow at [facility]  . Salivary gland surgery  12-11    LEFT SIDE NODULE REMOVED WAS BENIGN  . Colonoscopy      reports that she has never smoked. She has never used smokeless tobacco. She reports that she drinks alcohol. She reports that she does not use illicit drugs. family history includes Cancer in her mother; Colon polyps in her mother; Heart attack in her maternal grandfather; Hyperlipidemia in her other; Hypertension in her other. There is no history of Esophageal  cancer, Rectal cancer, or Stomach cancer. Allergies  Allergen Reactions  . Other     POLLEN AND DUST   Current Outpatient Prescriptions on File Prior to Visit  Medication Sig Dispense Refill  . albuterol (PROVENTIL HFA;VENTOLIN HFA) 108 (90 BASE) MCG/ACT inhaler Inhale 2 puffs into the lungs every 6 (six) hours as needed. Shortness of breath 1 Inhaler 5  . amLODipine (NORVASC) 5 MG tablet TAKE ONE TABLET BY MOUTH ONCE DAILY 90 tablet 3  . clonazePAM (KLONOPIN) 0.5 MG tablet Take 1 tablet (0.5 mg total) by mouth 2 (two) times daily as needed for anxiety. 60 tablet 1  . HYDROcodone-homatropine (HYCODAN) 5-1.5 MG/5ML syrup Take 5 mLs by mouth every 6 (six) hours as needed for cough. 180 mL 0  . Linaclotide (LINZESS) 145 MCG CAPS capsule Take 1 capsule (145 mcg total) by mouth daily. 30 capsule 11  . lisinopril-hydrochlorothiazide (ZESTORETIC) 20-12.5 MG per tablet Take 1 tablet by mouth daily. 90 tablet 3  . naproxen (NAPROSYN) 500 MG tablet Take 1 tablet (500 mg total) by mouth 2 (two) times daily with a meal. As needed for pain 60 tablet 3  . pantoprazole (PROTONIX) 40 MG tablet TAKE ONE TABLET BY MOUTH ONCE DAILY 90 tablet 2  . [DISCONTINUED] citalopram (CELEXA) 10 MG tablet 3 tabs by mouth once per day 270 tablet 3  . [  DISCONTINUED] fexofenadine (ALLEGRA) 180 MG tablet Take 1 tablet (180 mg total) by mouth daily. 30 tablet 11  . [DISCONTINUED] fluticasone (FLONASE) 50 MCG/ACT nasal spray 2 sprays by Nasal route daily. 16 g 2   No current facility-administered medications on file prior to visit.    Review of Systems  Constitutional: Negative for unusual diaphoresis or night sweats HENT: Negative for ringing in ear or discharge Eyes: Negative for double vision or worsening visual disturbance.  Respiratory: Negative for choking and stridor.   Gastrointestinal: Negative for vomiting or other signifcant bowel change Genitourinary: Negative for hematuria or change in urine volume.    Musculoskeletal: Negative for other MSK pain or swelling Skin: Negative for color change and worsening wound.  Neurological: Negative for tremors and numbness other than noted  Psychiatric/Behavioral: Negative for decreased concentration or agitation other than above       Objective:   Physical Exam BP 138/88 mmHg  Pulse 83  Temp(Src) 98.6 F (37 C) (Oral)  Resp 18  Ht 5' 1.5" (1.562 m)  Wt 163 lb 1.3 oz (73.973 kg)  BMI 30.32 kg/m2  SpO2 95% VS noted, not ill appearing Constitutional: Pt appears in no significant distress HENT: Head: NCAT.  Right Ear: External ear normal.  Left Ear: External ear normal.  Eyes: . Pupils are equal, round, and reactive to light. Conjunctivae and EOM are normal Neck: Normal range of motion. Neck supple.  Cardiovascular: Normal rate and regular rhythm.   Pulmonary/Chest: Effort normal and breath sounds without rales or wheezing.  Neurological: Pt is alert. Not confused , motor grossly intact Skin: Skin is warm. No rash, no LE edema Psychiatric: Pt behavior is normal. No agitation. + depressed affect, mild nervous    Assessment & Plan:

## 2014-11-02 NOTE — Assessment & Plan Note (Addendum)
Recent subjective worsening, depressive demeanor, denies SI or HI, prefers SSRI without wt gain if possible, for celexa asd, declines referral for counseling at this time

## 2014-11-02 NOTE — Assessment & Plan Note (Signed)
stable overall by history and exam, recent data reviewed with pt, and pt to continue medical treatment as before,  to f/u any worsening symptoms or concerns Lab Results  Component Value Date   WBC 8.1 03/21/2014   HGB 11.8* 03/21/2014   HCT 35.2* 03/21/2014   PLT 195.0 03/21/2014   GLUCOSE 80 03/21/2014   CHOL 176 03/21/2014   TRIG 72.0 03/21/2014   HDL 35.50* 03/21/2014   LDLDIRECT 156.0 04/17/2013   LDLCALC 126* 03/21/2014   ALT 7 03/21/2014   AST 16 03/21/2014   NA 138 03/21/2014   K 3.6 03/21/2014   CL 105 03/21/2014   CREATININE 0.8 03/21/2014   BUN 9 03/21/2014   CO2 25 03/21/2014   TSH 0.60 03/21/2014

## 2014-11-02 NOTE — Assessment & Plan Note (Signed)
stable overall by history and exam, recent data reviewed with pt, and pt to continue medical treatment as before,  to f/u any worsening symptoms or concerns BP Readings from Last 3 Encounters:  11/01/14 138/88  06/19/14 134/88  03/27/14 132/86

## 2014-12-09 ENCOUNTER — Ambulatory Visit (INDEPENDENT_AMBULATORY_CARE_PROVIDER_SITE_OTHER): Payer: BC Managed Care – PPO

## 2014-12-09 ENCOUNTER — Ambulatory Visit (INDEPENDENT_AMBULATORY_CARE_PROVIDER_SITE_OTHER): Payer: BC Managed Care – PPO | Admitting: Podiatry

## 2014-12-09 ENCOUNTER — Encounter: Payer: Self-pay | Admitting: Podiatry

## 2014-12-09 VITALS — BP 147/88 | HR 74 | Resp 18

## 2014-12-09 DIAGNOSIS — R52 Pain, unspecified: Secondary | ICD-10-CM | POA: Diagnosis not present

## 2014-12-09 DIAGNOSIS — M2040 Other hammer toe(s) (acquired), unspecified foot: Secondary | ICD-10-CM

## 2014-12-09 NOTE — Patient Instructions (Signed)
Pre-Operative Instructions  Congratulations, you have decided to take an important step to improving your quality of life.  You can be assured that the doctors of [facility] will be with you every step of the way.  1. Plan to be at the surgery center/hospital at least 1 (one) hour prior to your scheduled time unless otherwise directed by the surgical center/hospital staff.  You must have a responsible adult accompany you, remain during the surgery and drive you home.  Make sure you have directions to the surgical center/hospital and know how to get there on time. 2. For hospital based surgery you will need to obtain a history and physical form from your family physician within 1 month prior to the date of surgery- we will give you a form for you primary physician.  3. We make every effort to accommodate the date you request for surgery.  There are however, times where surgery dates or times have to be moved.  We will contact you as soon as possible if a change in schedule is required.   4. No Aspirin/Ibuprofen for one week before surgery.  If you are on aspirin, any non-steroidal anti-inflammatory medications (Mobic, Aleve, Ibuprofen) you should stop taking it 7 days prior to your surgery.  You make take Tylenol  For pain prior to surgery.  5. Medications- If you are taking daily heart and blood pressure medications, seizure, reflux, allergy, asthma, anxiety, pain or diabetes medications, make sure the surgery center/hospital is aware before the day of surgery so they may notify you which medications to take or avoid the day of surgery. 6. No food or drink after midnight the night before surgery unless directed otherwise by surgical center/hospital staff. 7. No alcoholic beverages 24 hours prior to surgery.  No smoking 24 hours prior to or 24 hours after surgery. 8. Wear loose pants or shorts- loose enough to fit over bandages, boots, and casts. 9. No slip on shoes, sneakers are best. 10. Bring  your boot with you to the surgery center/hospital.  Also bring crutches or a walker if your physician has prescribed it for you.  If you do not have this equipment, it will be provided for you after surgery. 11. If you have not been contracted by the surgery center/hospital by the day before your surgery, call to confirm the date and time of your surgery. 12. Leave-time from work may vary depending on the type of surgery you have.  Appropriate arrangements should be made prior to surgery with your employer. 13. Prescriptions will be provided immediately following surgery by your doctor.  Have these filled as soon as possible after surgery and take the medication as directed. 14. Remove nail polish on the operative foot. 15. Wash the night before surgery.  The night before surgery wash the foot and leg well with the antibacterial soap provided and water paying special attention to beneath the toenails and in between the toes.  Rinse thoroughly with water and dry well with a towel.  Perform this wash unless told not to do so by your physician.  Enclosed: 1 Ice pack (please put in freezer the night before surgery)   1 Hibiclens skin cleaner   Pre-op Instructions  If you have any questions regarding the instructions, do not hesitate to call our office.  [place]: [address] [place], [place] [address] [phone]  [place]: [address] [place], [place] [address] [phone]  [place]: [address]  [place], [place] [address] [phone]  Dr. Richard   Tuchman DPM, Dr. Timaya Bojarski DPM Dr. Richard Sikora DPM, Dr. M. Todd Hyatt DPM, Dr. Kathryn Egerton DPM 

## 2014-12-09 NOTE — Progress Notes (Signed)
   Subjective:    Patient ID: [name], female    DOB: 1965-12-26, 49 y.o.   MRN: [id number]  HPI i have some corns on my 2nd toes on both feet and they hurt with shoes and has been going on for about 6 months and is sore and have the big toenail and the 2nd toenail that is brittle and cracking and is discolored and i do get pedicures    Review of Systems  All other systems reviewed and are negative.      Objective:   Physical Exam        Assessment & Plan:

## 2014-12-10 NOTE — Progress Notes (Signed)
Subjective:     Patient ID: [name], female   DOB: 1965/10/14, 49 y.o.   MRN: [id number]  HPI patient presents with painful corns on the second toes of both feet and thickness of the second nails bilateral with inability to wear shoe gear comfortably and states that she's tried padding and wider shoes and soaks without relief   Review of Systems  All other systems reviewed and are negative.      Objective:   Physical Exam  Constitutional: She is oriented to person, place, and time.  Cardiovascular: Intact distal pulses.   Musculoskeletal: Normal range of motion.  Neurological: She is oriented to person, place, and time.  Skin: Skin is warm.  Nursing note and vitals reviewed.  neurovascular status intact muscle strength adequate with range of motion subtalar midtarsal joint within normal limits. Patient has deviation of the distal second toe bilateral with keratotic lesion at the interphalangeal joint with pain and has thickness of the second nail secondary to structural alignment of the toe and probable trauma with possibility for fungus. Patient has good digital perfusion and is well oriented 3     Assessment:     Hammertoe deformity distal second digit bilateral with lesion formation and nail disease which may be due partially to position of the toe or partially fungus    Plan:     H&P and x-rays reviewed with patient. At this point we discussed treatment options and she states that the toe is really bothering her and both feet and that she would like to have them fixed. I've recommended distal arthroplasty and reviewed procedure with patient. Patient is amenable to this wants surgery and at this time is scheduled for distal arthroplasty digit 2 bilateral and I'm hoping this will help her underlying nail condition to get the toes in better alignment. She is willing to accept risk read the consent form today and after extensive review and after going over alternative treatments and  complications she signs consent form. Scheduled for outpatient distal arthroplasty digit 2 of both feet at this time and is encouraged to call with any questions prior to procedure

## 2014-12-18 ENCOUNTER — Other Ambulatory Visit: Payer: Self-pay | Admitting: Internal Medicine

## 2014-12-18 NOTE — Telephone Encounter (Signed)
Called refill into pharmacy spoke with Broadus John gave md approval.../lmb

## 2014-12-18 NOTE — Telephone Encounter (Signed)
MD is out of office pls advise on refill.../lmb 

## 2014-12-18 NOTE — Telephone Encounter (Signed)
OK X1 

## 2014-12-31 ENCOUNTER — Telehealth: Payer: Self-pay | Admitting: *Deleted

## 2014-12-31 ENCOUNTER — Encounter: Payer: Self-pay | Admitting: Podiatry

## 2014-12-31 DIAGNOSIS — M2042 Other hammer toe(s) (acquired), left foot: Secondary | ICD-10-CM | POA: Diagnosis not present

## 2014-12-31 DIAGNOSIS — M2041 Other hammer toe(s) (acquired), right foot: Secondary | ICD-10-CM | POA: Diagnosis not present

## 2014-12-31 NOTE — Telephone Encounter (Signed)
Pharmacist states pt brought in Vicodin 10/325, and Vicodin is not written in that strength but Norco 10/325 is available.  Dr. Paulla Dolly states that is fine.

## 2015-01-05 ENCOUNTER — Ambulatory Visit (INDEPENDENT_AMBULATORY_CARE_PROVIDER_SITE_OTHER): Payer: BC Managed Care – PPO | Admitting: Physician Assistant

## 2015-01-05 VITALS — BP 130/80 | HR 82 | Temp 99.1°F | Resp 16 | Ht 61.5 in | Wt 164.0 lb

## 2015-01-05 DIAGNOSIS — B379 Candidiasis, unspecified: Secondary | ICD-10-CM

## 2015-01-05 DIAGNOSIS — R05 Cough: Secondary | ICD-10-CM | POA: Diagnosis not present

## 2015-01-05 DIAGNOSIS — T3695XA Adverse effect of unspecified systemic antibiotic, initial encounter: Secondary | ICD-10-CM

## 2015-01-05 DIAGNOSIS — R059 Cough, unspecified: Secondary | ICD-10-CM

## 2015-01-05 DIAGNOSIS — J0100 Acute maxillary sinusitis, unspecified: Secondary | ICD-10-CM

## 2015-01-05 MED ORDER — HYDROCODONE-HOMATROPINE 5-1.5 MG/5ML PO SYRP: 5.0000 mL | ORAL_SOLUTION | Freq: Four times a day (QID) | ORAL | Status: DC | PRN

## 2015-01-05 MED ORDER — BENZONATATE 100 MG PO CAPS: 100.0000 mg | ORAL_CAPSULE | Freq: Three times a day (TID) | ORAL | Status: DC | PRN

## 2015-01-05 MED ORDER — AMOXICILLIN-POT CLAVULANATE 875-125 MG PO TABS: 1.0000 | ORAL_TABLET | Freq: Two times a day (BID) | ORAL | Status: DC

## 2015-01-05 MED ORDER — FLUCONAZOLE 150 MG PO TABS: 150.0000 mg | ORAL_TABLET | Freq: Once | ORAL | Status: DC

## 2015-01-05 NOTE — Progress Notes (Signed)
   Subjective:    Patient ID: [name], female    DOB: [birth year], 49 y.o.   MRN: [id number]  Chief Complaint  Patient presents with  . Nasal Congestion    x 2 days with yellow mucus   Medications, allergies, past medical history, surgical history, family history, social history and problem list reviewed and updated.  HPI  49 yof presents with head/nasal congestion.  Sx started 3 days ago with head/nasal congestion. Pressure behind bilateral eyes. Has been persistent and keeping her up at night. Low grade fevers at home past 2 days. Mildly prod cough with yellow sputum. Took tylenol before appt today.   Typically does not have seasonal allergies. Denies otalgia, itchy watery eyes.   Review of Systems No abd pain, n/v, diarrhea.     Objective:   Physical Exam  Constitutional: She appears well-developed and well-nourished.  Non-toxic appearance. She does not have a sickly appearance. She does not appear ill. No distress.  BP 130/80 mmHg  Pulse 82  Temp(Src) 99.1 F (37.3 C) (Oral)  Resp 16  Ht 5' 1.5" (1.562 m)  Wt 164 lb (74.39 kg)  BMI 30.49 kg/m2  SpO2 98%  LMP 12/13/2014   HENT:  Right Ear: Tympanic membrane normal.  Left Ear: Tympanic membrane normal.  Nose: Mucosal edema and rhinorrhea present. Right sinus exhibits maxillary sinus tenderness. Right sinus exhibits no frontal sinus tenderness. Left sinus exhibits maxillary sinus tenderness. Left sinus exhibits no frontal sinus tenderness.  Mouth/Throat: Uvula is midline, oropharynx is clear and moist and mucous membranes are normal.  Mod-severe maxillary ttp.   Pulmonary/Chest: Effort normal and breath sounds normal.  Lymphadenopathy:       Head (right side): Submandibular adenopathy present. No submental and no tonsillar adenopathy present.       Head (left side): Submandibular adenopathy present. No submental and no tonsillar adenopathy present.    She has no cervical adenopathy.      Assessment & Plan:   49  yof presents with head/nasal congestion.  Acute maxillary sinusitis, recurrence not specified - Plan: amoxicillin-clavulanate (AUGMENTIN) 875-125 MG per tablet --suspect bacterial with severe sinus ttp, bothersome head pressure keeping her up, low grade fevers at home  Antibiotic-induced yeast infection - Plan: fluconazole (DIFLUCAN) 150 MG tablet --as needed  Cough - Plan: HYDROcodone-homatropine (HYCODAN) 5-1.5 MG/5ML syrup, benzonatate (TESSALON) 100 MG capsule --tessalon during day, hycodan at night  Julieta Gutting, PA-C Physician Assistant-Certified [facility]  01/05/2015 10:49 AM

## 2015-01-05 NOTE — Patient Instructions (Signed)
Take the augmentin twice daily for 10 days. If you get a yeast infection, take the diflucan once. If symptoms of yeast infection return, you can take another diflucan.  Sinusitis Sinusitis is redness, soreness, and inflammation of the paranasal sinuses. Paranasal sinuses are air pockets within the bones of your face (beneath the eyes, the middle of the forehead, or above the eyes). In healthy paranasal sinuses, mucus is able to drain out, and air is able to circulate through them by way of your nose. However, when your paranasal sinuses are inflamed, mucus and air can become trapped. This can allow bacteria and other germs to grow and cause infection. Sinusitis can develop quickly and last only a short time (acute) or continue over a long period (chronic). Sinusitis that lasts for more than 12 weeks is considered chronic.  CAUSES  Causes of sinusitis include:  Allergies.  Structural abnormalities, such as displacement of the cartilage that separates your nostrils (deviated septum), which can decrease the air flow through your nose and sinuses and affect sinus drainage.  Functional abnormalities, such as when the small hairs (cilia) that line your sinuses and help remove mucus do not work properly or are not present. SIGNS AND SYMPTOMS  Symptoms of acute and chronic sinusitis are the same. The primary symptoms are pain and pressure around the affected sinuses. Other symptoms include:  Upper toothache.  Earache.  Headache.  Bad breath.  Decreased sense of smell and taste.  A cough, which worsens when you are lying flat.  Fatigue.  Fever.  Thick drainage from your nose, which often is green and may contain pus (purulent).  Swelling and warmth over the affected sinuses. DIAGNOSIS  Your health care provider will perform a physical exam. During the exam, your health care provider may:  Look in your nose for signs of abnormal growths in your nostrils (nasal polyps).  Tap over the  affected sinus to check for signs of infection.  View the inside of your sinuses (endoscopy) using an imaging device that has a light attached (endoscope). If your health care provider suspects that you have chronic sinusitis, one or more of the following tests may be recommended:  Allergy tests.  Nasal culture. A sample of mucus is taken from your nose, sent to a lab, and screened for bacteria.  Nasal cytology. A sample of mucus is taken from your nose and examined by your health care provider to determine if your sinusitis is related to an allergy. TREATMENT  Most cases of acute sinusitis are related to a viral infection and will resolve on their own within 10 days. Sometimes medicines are prescribed to help relieve symptoms (pain medicine, decongestants, nasal steroid sprays, or saline sprays).  However, for sinusitis related to a bacterial infection, your health care provider will prescribe antibiotic medicines. These are medicines that will help kill the bacteria causing the infection.  Rarely, sinusitis is caused by a fungal infection. In theses cases, your health care provider will prescribe antifungal medicine. For some cases of chronic sinusitis, surgery is needed. Generally, these are cases in which sinusitis recurs more than 3 times per year, despite other treatments. HOME CARE INSTRUCTIONS   Drink plenty of water. Water helps thin the mucus so your sinuses can drain more easily.  Use a humidifier.  Inhale steam 3 to 4 times a day (for example, sit in the bathroom with the shower running).  Apply a warm, moist washcloth to your face 3 to 4 times a day, or as  directed by your health care provider.  Use saline nasal sprays to help moisten and clean your sinuses.  Take medicines only as directed by your health care provider.  If you were prescribed either an antibiotic or antifungal medicine, finish it all even if you start to feel better. SEEK IMMEDIATE MEDICAL CARE IF:  You  have increasing pain or severe headaches.  You have nausea, vomiting, or drowsiness.  You have swelling around your face.  You have vision problems.  You have a stiff neck.  You have difficulty breathing. MAKE SURE YOU:   Understand these instructions.  Will watch your condition.  Will get help right away if you are not doing well or get worse. Document Released: 07/05/2005 Document Revised: 11/19/2013 Document Reviewed: 07/20/2011 [facility] Patient Information 2015 [place], [place]. This information is not intended to replace advice given to you by your health care provider. Make sure you discuss any questions you have with your health care provider.

## 2015-01-06 ENCOUNTER — Ambulatory Visit (INDEPENDENT_AMBULATORY_CARE_PROVIDER_SITE_OTHER): Payer: BC Managed Care – PPO

## 2015-01-06 ENCOUNTER — Ambulatory Visit (INDEPENDENT_AMBULATORY_CARE_PROVIDER_SITE_OTHER): Payer: BC Managed Care – PPO | Admitting: Podiatry

## 2015-01-06 VITALS — BP 140/88 | HR 75 | Resp 15

## 2015-01-06 DIAGNOSIS — Z9889 Other specified postprocedural states: Secondary | ICD-10-CM

## 2015-01-06 DIAGNOSIS — M2040 Other hammer toe(s) (acquired), unspecified foot: Secondary | ICD-10-CM

## 2015-01-07 ENCOUNTER — Other Ambulatory Visit: Payer: BC Managed Care – PPO

## 2015-01-07 NOTE — Progress Notes (Signed)
Subjective:     Patient ID: [name], female   DOB: 1965/09/07, 49 y.o.   MRN: [id number]  HPI patient states I'm doing very well with my toes   Review of Systems     Objective:   Physical Exam Neurovascular status intact negative Homans sign noted with wound edges well coapted second digit bilateral with digits and good alignment    Assessment:     Doing well from arthroplasty digit 2 distal bilateral    Plan:     X-rays reviewed with patient and sterile dressings reapplied and reappoint 2 weeks for suture removal or earlier if necessary

## 2015-01-09 NOTE — Progress Notes (Signed)
DOS 12/31/2014 B/L 2nd Hammer toe repair distal.

## 2015-01-10 ENCOUNTER — Other Ambulatory Visit: Payer: Self-pay | Admitting: Internal Medicine

## 2015-01-13 ENCOUNTER — Ambulatory Visit (INDEPENDENT_AMBULATORY_CARE_PROVIDER_SITE_OTHER): Payer: Self-pay | Admitting: Emergency Medicine

## 2015-01-13 VITALS — BP 138/86 | HR 88 | Temp 98.4°F | Resp 16 | Ht 62.0 in | Wt 162.0 lb

## 2015-01-13 DIAGNOSIS — Z021 Encounter for pre-employment examination: Secondary | ICD-10-CM

## 2015-01-13 DIAGNOSIS — Z024 Encounter for examination for driving license: Secondary | ICD-10-CM

## 2015-01-13 NOTE — Progress Notes (Signed)
Subjective:  Patient ID: [name], female    DOB: 29-Apr-1966  Age: 49 y.o. MRN: [id number]  CC: Employment Physical   HPI [name] presents  DOT  History [name] has a past medical history of Anxiety; Depression; Hyperlipidemia; Hypertension; Obesity; Allergic rhinitis; GERD (gastroesophageal reflux disease); Hyperplastic colon polyp (10/2003); IBS (irritable bowel syndrome); RLS (restless legs syndrome); Menorrhagia; and Allergy.   She has past surgical history that includes Tubal ligation; Endometrial ablation (2007); Salivary gland surgery (12-11); and Colonoscopy.   Her  family history includes Cancer in her mother; Colon polyps in her mother; Heart attack in her maternal grandfather; Hyperlipidemia in her other; Hypertension in her other. There is no history of Esophageal cancer, Rectal cancer, or Stomach cancer.  She   reports that she has never smoked. She has never used smokeless tobacco. She reports that she drinks alcohol. She reports that she does not use illicit drugs.  Outpatient Prescriptions Prior to Visit  Medication Sig Dispense Refill  . albuterol (PROVENTIL HFA;VENTOLIN HFA) 108 (90 BASE) MCG/ACT inhaler Inhale 2 puffs into the lungs every 6 (six) hours as needed. Shortness of breath 1 Inhaler 5  . amLODipine (NORVASC) 5 MG tablet TAKE ONE TABLET BY MOUTH ONCE DAILY 90 tablet 3  . lisinopril-hydrochlorothiazide (ZESTORETIC) 20-12.5 MG per tablet Take 1 tablet by mouth daily. 90 tablet 3  . amoxicillin-clavulanate (AUGMENTIN) 875-125 MG per tablet Take 1 tablet by mouth 2 (two) times daily. (Patient not taking: Reported on 01/13/2015) 20 tablet 0  . benzonatate (TESSALON) 100 MG capsule Take 1-2 capsules (100-200 mg total) by mouth 3 (three) times daily as needed for cough. (Patient not taking: Reported on 01/13/2015) 40 capsule 0  . citalopram (CELEXA) 20 MG tablet Take 1 tablet (20 mg total) by mouth daily. (Patient not taking: Reported on 01/13/2015) 90 tablet 3   . clonazePAM (KLONOPIN) 0.5 MG tablet TAKE ONE TABLET BY MOUTH TWICE DAILY AS NEEDED FOR ANXIETY (Patient not taking: Reported on 01/13/2015) 60 tablet 0  . fluconazole (DIFLUCAN) 150 MG tablet Take 1 tablet (150 mg total) by mouth once. Repeat if needed (Patient not taking: Reported on 01/13/2015) 2 tablet 0  . HYDROcodone-acetaminophen (NORCO) 10-325 MG per tablet Take 1 tablet by mouth every 6 (six) hours as needed (one tablet every 4-6 hours prn foot pain).    Marland Kitchen HYDROcodone-homatropine (HYCODAN) 5-1.5 MG/5ML syrup Take 5 mLs by mouth every 6 (six) hours as needed for cough. (Patient not taking: Reported on 01/13/2015) 120 mL 0  . Linaclotide (LINZESS) 145 MCG CAPS capsule Take 1 capsule (145 mcg total) by mouth daily. (Patient not taking: Reported on 01/13/2015) 30 capsule 11  . lisinopril (PRINIVIL,ZESTRIL) 20 MG tablet Take 1 tablet (20 mg total) by mouth daily. (Patient not taking: Reported on 01/13/2015) 90 tablet 1  . pantoprazole (PROTONIX) 40 MG tablet TAKE ONE TABLET BY MOUTH ONCE DAILY (Patient not taking: Reported on 01/13/2015) 90 tablet 2   No facility-administered medications prior to visit.    History   Social History  . Marital Status: Divorced    Spouse Name: N/A  . Number of Children: 2  . Years of Education: N/A   Occupational History  . Bus driver   .  [place] History Main Topics  . Smoking status: Never Smoker   . Smokeless tobacco: Never Used  . Alcohol Use: Yes     Comment: some  . Drug Use: No  . Sexual Activity: Not Currently  Birth Control/ Protection: Surgical     Comment: tubal ligation   Other Topics Concern  . None   Social History Narrative   Patient does not get regular exercise     Review of Systems  Constitutional: Negative for fever, chills and appetite change.  HENT: Negative for congestion, ear pain, postnasal drip, sinus pressure and sore throat.   Eyes: Negative for pain and redness.  Respiratory: Negative  for cough, shortness of breath and wheezing.   Cardiovascular: Negative for leg swelling.  Gastrointestinal: Negative for nausea, vomiting, abdominal pain, diarrhea, constipation and blood in stool.  Endocrine: Negative for polyuria.  Genitourinary: Negative for dysuria, urgency, frequency and flank pain.  Musculoskeletal: Negative for gait problem.  Skin: Negative for rash.  Neurological: Negative for weakness and headaches.  Psychiatric/Behavioral: Negative for confusion and decreased concentration. The patient is not nervous/anxious.     Objective:  BP 138/86 mmHg  Pulse 88  Temp(Src) 98.4 F (36.9 C) (Oral)  Resp 16  Ht 5\' 2"  (1.575 m)  Wt 162 lb (73.483 kg)  BMI 29.62 kg/m2  SpO2 98%  LMP 12/13/2014  Physical Exam  Constitutional: She is oriented to person, place, and time. She appears well-developed and well-nourished. No distress.  HENT:  Head: Normocephalic and atraumatic.  Right Ear: External ear normal.  Left Ear: External ear normal.  Nose: Nose normal.  Eyes: Conjunctivae and EOM are normal. Pupils are equal, round, and reactive to light. No scleral icterus.  Neck: Normal range of motion. Neck supple. No tracheal deviation present.  Cardiovascular: Normal rate, regular rhythm and normal heart sounds.   Pulmonary/Chest: Effort normal. No respiratory distress. She has no wheezes. She has no rales.  Abdominal: She exhibits no mass. There is no tenderness. There is no rebound and no guarding.  Musculoskeletal: She exhibits no edema.  Lymphadenopathy:    She has no cervical adenopathy.  Neurological: She is alert and oriented to person, place, and time. Coordination normal.  Skin: Skin is warm and dry. No rash noted.  Psychiatric: She has a normal mood and affect. Her behavior is normal.      Assessment & Plan:   [name] was seen today for employment physical.  Diagnoses and all orders for this visit:  Encounter for commercial driver medical examination  (CDME)   I am having [name] maintain her lisinopril-hydrochlorothiazide, albuterol, Linaclotide, amLODipine, pantoprazole, citalopram, clonazePAM, amoxicillin-clavulanate, fluconazole, HYDROcodone-homatropine, benzonatate, HYDROcodone-acetaminophen, and lisinopril.  No orders of the defined types were placed in this encounter.    Appropriate red flag conditions were discussed with the patient as well as actions that should be taken.  Patient expressed his understanding.  Follow-up: Return in about 1 year (around 01/13/2016).  Roselee Culver, MD

## 2015-01-21 ENCOUNTER — Ambulatory Visit: Payer: BC Managed Care – PPO | Admitting: Podiatry

## 2015-01-21 VITALS — BP 139/90 | HR 67 | Resp 15

## 2015-01-21 DIAGNOSIS — Z9889 Other specified postprocedural states: Secondary | ICD-10-CM

## 2015-02-07 ENCOUNTER — Encounter: Payer: Self-pay | Admitting: Gastroenterology

## 2015-02-08 ENCOUNTER — Encounter (HOSPITAL_COMMUNITY): Payer: Self-pay | Admitting: Emergency Medicine

## 2015-02-08 ENCOUNTER — Emergency Department (HOSPITAL_COMMUNITY)
Admission: EM | Admit: 2015-02-08 | Discharge: 2015-02-08 | Disposition: A | Discharge status: Home / Self Care | Payer: BC Managed Care – PPO | Attending: Emergency Medicine | Admitting: Emergency Medicine

## 2015-02-08 DIAGNOSIS — F419 Anxiety disorder, unspecified: Secondary | ICD-10-CM | POA: Insufficient documentation

## 2015-02-08 DIAGNOSIS — Z79899 Other long term (current) drug therapy: Secondary | ICD-10-CM | POA: Diagnosis not present

## 2015-02-08 DIAGNOSIS — G2581 Restless legs syndrome: Secondary | ICD-10-CM | POA: Insufficient documentation

## 2015-02-08 DIAGNOSIS — Z86010 Personal history of colonic polyps: Secondary | ICD-10-CM | POA: Insufficient documentation

## 2015-02-08 DIAGNOSIS — S3992XA Unspecified injury of lower back, initial encounter: Secondary | ICD-10-CM | POA: Diagnosis present

## 2015-02-08 DIAGNOSIS — Z8742 Personal history of other diseases of the female genital tract: Secondary | ICD-10-CM | POA: Diagnosis not present

## 2015-02-08 DIAGNOSIS — Y998 Other external cause status: Secondary | ICD-10-CM | POA: Insufficient documentation

## 2015-02-08 DIAGNOSIS — I10 Essential (primary) hypertension: Secondary | ICD-10-CM | POA: Diagnosis not present

## 2015-02-08 DIAGNOSIS — K219 Gastro-esophageal reflux disease without esophagitis: Secondary | ICD-10-CM | POA: Diagnosis not present

## 2015-02-08 DIAGNOSIS — Z792 Long term (current) use of antibiotics: Secondary | ICD-10-CM | POA: Insufficient documentation

## 2015-02-08 DIAGNOSIS — E669 Obesity, unspecified: Secondary | ICD-10-CM | POA: Insufficient documentation

## 2015-02-08 DIAGNOSIS — Z8709 Personal history of other diseases of the respiratory system: Secondary | ICD-10-CM | POA: Diagnosis not present

## 2015-02-08 DIAGNOSIS — F329 Major depressive disorder, single episode, unspecified: Secondary | ICD-10-CM | POA: Insufficient documentation

## 2015-02-08 DIAGNOSIS — Y92410 Unspecified street and highway as the place of occurrence of the external cause: Secondary | ICD-10-CM | POA: Insufficient documentation

## 2015-02-08 DIAGNOSIS — S0990XA Unspecified injury of head, initial encounter: Secondary | ICD-10-CM | POA: Insufficient documentation

## 2015-02-08 DIAGNOSIS — Y9389 Activity, other specified: Secondary | ICD-10-CM | POA: Insufficient documentation

## 2015-02-08 DIAGNOSIS — S39012A Strain of muscle, fascia and tendon of lower back, initial encounter: Secondary | ICD-10-CM

## 2015-02-08 MED ORDER — IBUPROFEN 800 MG PO TABS
800.0000 mg | ORAL_TABLET | Freq: Once | ORAL | Status: AC
  Administered 2015-02-08: 800 mg via ORAL
  Filled 2015-02-08: qty 1

## 2015-02-08 MED ORDER — METHOCARBAMOL 500 MG PO TABS
1000.0000 mg | ORAL_TABLET | Freq: Once | ORAL | Status: AC
  Administered 2015-02-08: 1000 mg via ORAL
  Filled 2015-02-08: qty 2

## 2015-02-08 MED ORDER — METHOCARBAMOL 500 MG PO TABS: 500.0000 mg | ORAL_TABLET | Freq: Two times a day (BID) | ORAL | Status: DC

## 2015-02-08 MED ORDER — NAPROXEN 500 MG PO TABS: 500.0000 mg | ORAL_TABLET | Freq: Two times a day (BID) | ORAL | Status: DC
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
## 2015-02-08 NOTE — Discharge Instructions (Signed)
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
## 2015-02-17 ENCOUNTER — Other Ambulatory Visit: Payer: BC Managed Care – PPO

## 2015-02-20 ENCOUNTER — Ambulatory Visit (INDEPENDENT_AMBULATORY_CARE_PROVIDER_SITE_OTHER): Payer: BC Managed Care – PPO | Admitting: Podiatry

## 2015-02-20 ENCOUNTER — Ambulatory Visit (INDEPENDENT_AMBULATORY_CARE_PROVIDER_SITE_OTHER): Payer: BC Managed Care – PPO

## 2015-02-20 ENCOUNTER — Ambulatory Visit: Payer: BC Managed Care – PPO

## 2015-02-20 DIAGNOSIS — Z9889 Other specified postprocedural states: Secondary | ICD-10-CM

## 2015-02-20 DIAGNOSIS — M2040 Other hammer toe(s) (acquired), unspecified foot: Secondary | ICD-10-CM

## 2015-02-20 NOTE — Progress Notes (Signed)
Subjective:     Patient ID: [name], female   DOB: [birth year], 49 y.o.   MRN: [id number]  HPI patient presents stating my toes are doing well I have bumped them and I have some swelling in my second toe right that I wanted to get checked   Review of Systems     Objective:   Physical Exam Neurovascular status intact muscle strength adequate range of motion within normal limits with second digit bilateral that looks good with wound edges coapted well with mild swelling second toe right foot    Assessment:     Hammertoe correction second digit bilateral which is doing well with slight inflammation second toe right    Plan:     X-rays reviewed patient doing well and at this time is allowed to return to normal shoe is discharged and less she needs any issues

## 2015-03-13 ENCOUNTER — Encounter: Payer: BC Managed Care – PPO | Admitting: Women's Health

## 2015-03-21 ENCOUNTER — Ambulatory Visit (INDEPENDENT_AMBULATORY_CARE_PROVIDER_SITE_OTHER): Payer: BC Managed Care – PPO | Admitting: Women's Health

## 2015-03-21 ENCOUNTER — Encounter: Payer: Self-pay | Admitting: Women's Health

## 2015-03-21 VITALS — BP 140/80 | Ht 62.0 in | Wt 166.0 lb

## 2015-03-21 DIAGNOSIS — Z113 Encounter for screening for infections with a predominantly sexual mode of transmission: Secondary | ICD-10-CM | POA: Diagnosis not present

## 2015-03-21 DIAGNOSIS — Z01419 Encounter for gynecological examination (general) (routine) without abnormal findings: Secondary | ICD-10-CM

## 2015-03-21 DIAGNOSIS — N946 Dysmenorrhea, unspecified: Secondary | ICD-10-CM | POA: Diagnosis not present

## 2015-03-21 MED ORDER — IBUPROFEN 600 MG PO TABS: 600.0000 mg | ORAL_TABLET | Freq: Three times a day (TID) | ORAL | Status: DC | PRN

## 2015-03-21 NOTE — Progress Notes (Signed)
[name] [birth year] [id number]    History:    Presents for annual exam.  Monthly cycle/BTL moderate flow. Ablation 2007. Normal Pap and mammogram history. History of a 3 cm fibroid. X partner unfaithful. 2010 benign colon polyp. 2015 negative colonoscopy, Mother died of colon cancer age 47. Hypertension, hypercholesterolemia and IBS managed by primary care.  Past medical history, past surgical history, family history and social history were all reviewed and documented in the EPIC chart. 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 47 year old son diagnosed with schizophrenia this past year, having numerous problems. Other son lives in [place] doing well.  ROS:  A ROS was performed and pertinent positives and negatives are included.  Exam:  Filed Vitals:   03/21/15 1006  BP: 140/80    General appearance:  Normal Thyroid:  Symmetrical, normal in size, without palpable masses or nodularity. Respiratory  Auscultation:  Clear without wheezing or rhonchi Cardiovascular  Auscultation:  Regular rate, without rubs, murmurs or gallops  Edema/varicosities:  Not grossly evident Abdominal  Soft,nontender, without masses, guarding or rebound.  Liver/spleen:  No organomegaly noted  Hernia:  None appreciated  Skin  Inspection:  Grossly normal   Breasts: Examined lying and sitting.     Right: Without masses, retractions, discharge or axillary adenopathy.     Left: Without masses, retractions, discharge or axillary adenopathy. Gentitourinary   Inguinal/mons:  Normal without inguinal adenopathy  External genitalia:  Normal  BUS/Urethra/Skene's glands:  Normal  Vagina:  Normal  Cervix:  Normal  Uterus:  normal in size, shape and contour.  Midline and mobile  Adnexa/parametria:     Rt: Without masses or tenderness.   Lt: Without masses or tenderness.  Anus and perineum: Normal  Digital rectal exam: Normal sphincter tone without palpated masses or tenderness  Assessment/Plan:   49 y.o. SBF [name] for annual exam.     Monthly cycle/BTL STD screen Situational stress Hypertension/hypercholesterolemia/IBS primary care manages labs and meds  Plan: SBE's, continue annual screening mammogram, calcium rich diet, vitamin D 1000 daily encouraged. Reviewed importance of exercise. GC/Chlamydia, HIV, hep B, C, RPR. Pap normal 2015, new screening guidelines reviewed. Encouraged counseling, increasing leisure activities. Condoms encouraged if sexually active.   Huel Cote WHNP, 1:22 PM 03/21/2015

## 2015-03-21 NOTE — Patient Instructions (Signed)

## 2015-03-22 LAB — URINALYSIS W MICROSCOPIC + REFLEX CULTURE
BACTERIA UA: NONE SEEN [HPF]
BILIRUBIN URINE: NEGATIVE
Casts: NONE SEEN [LPF]
Crystals: NONE SEEN [HPF]
GLUCOSE, UA: NEGATIVE
Hgb urine dipstick: NEGATIVE
Ketones, ur: NEGATIVE
LEUKOCYTES UA: NEGATIVE
NITRITE: NEGATIVE
PH: 6 (ref 5.0–8.0)
PROTEIN: NEGATIVE
Specific Gravity, Urine: 1.009 (ref 1.001–1.035)
Yeast: NONE SEEN [HPF]

## 2015-03-22 LAB — RPR

## 2015-03-22 LAB — HEPATITIS B SURFACE ANTIGEN: Hepatitis B Surface Ag: NEGATIVE

## 2015-03-22 LAB — HIV ANTIBODY (ROUTINE TESTING W REFLEX): HIV: NONREACTIVE

## 2015-03-22 LAB — HEPATITIS C ANTIBODY: HCV Ab: NEGATIVE

## 2015-03-23 LAB — URINE CULTURE: Colony Count: 30000

## 2015-03-25 LAB — GC/CHLAMYDIA PROBE AMP
CT Probe RNA: NEGATIVE
GC PROBE AMP APTIMA: NEGATIVE

## 2015-04-08 ENCOUNTER — Other Ambulatory Visit: Payer: Self-pay | Admitting: Internal Medicine

## 2015-04-08 NOTE — Telephone Encounter (Signed)
Done hardcopy to Dahlia  

## 2015-04-09 NOTE — Telephone Encounter (Signed)
Rx faxed to pharmacy  

## 2015-05-02 ENCOUNTER — Other Ambulatory Visit (INDEPENDENT_AMBULATORY_CARE_PROVIDER_SITE_OTHER): Payer: BC Managed Care – PPO

## 2015-05-02 DIAGNOSIS — Z0000 Encounter for general adult medical examination without abnormal findings: Secondary | ICD-10-CM

## 2015-05-02 DIAGNOSIS — Z0189 Encounter for other specified special examinations: Secondary | ICD-10-CM

## 2015-05-02 LAB — CBC WITH DIFFERENTIAL/PLATELET
Basophils Absolute: 0 10*3/uL (ref 0.0–0.1)
Basophils Relative: 0.3 % (ref 0.0–3.0)
EOS PCT: 0.3 % (ref 0.0–5.0)
Eosinophils Absolute: 0 10*3/uL (ref 0.0–0.7)
HCT: 39.2 % (ref 36.0–46.0)
Hemoglobin: 13 g/dL (ref 12.0–15.0)
LYMPHS ABS: 2.6 10*3/uL (ref 0.7–4.0)
Lymphocytes Relative: 32.5 % (ref 12.0–46.0)
MCHC: 33.1 g/dL (ref 30.0–36.0)
MCV: 92.3 fl (ref 78.0–100.0)
MONO ABS: 0.7 10*3/uL (ref 0.1–1.0)
Monocytes Relative: 8.4 % (ref 3.0–12.0)
NEUTROS ABS: 4.6 10*3/uL (ref 1.4–7.7)
NEUTROS PCT: 58.5 % (ref 43.0–77.0)
PLATELETS: 201 10*3/uL (ref 150.0–400.0)
RBC: 4.25 Mil/uL (ref 3.87–5.11)
RDW: 14.3 % (ref 11.5–15.5)
WBC: 7.9 10*3/uL (ref 4.0–10.5)

## 2015-05-02 LAB — LIPID PANEL
Cholesterol: 203 mg/dL — ABNORMAL HIGH (ref 0–200)
HDL: 45.3 mg/dL (ref >39.00)
LDL Cholesterol: 144 mg/dL — ABNORMAL HIGH (ref 0–99)
NonHDL: 158.01
TRIGLYCERIDES: 69 mg/dL (ref 0.0–149.0)
Total CHOL/HDL Ratio: 4
VLDL: 13.8 mg/dL (ref 0.0–40.0)

## 2015-05-02 LAB — BASIC METABOLIC PANEL
BUN: 10 mg/dL (ref 6–23)
CHLORIDE: 106 meq/L (ref 96–112)
CO2: 28 mEq/L (ref 19–32)
CREATININE: 0.79 mg/dL (ref 0.40–1.20)
Calcium: 9.5 mg/dL (ref 8.4–10.5)
GFR: 100.39 mL/min (ref >60.00)
Glucose, Bld: 90 mg/dL (ref 70–99)
Potassium: 4.4 mEq/L (ref 3.5–5.1)
Sodium: 141 mEq/L (ref 135–145)

## 2015-05-02 LAB — HEPATIC FUNCTION PANEL
ALBUMIN: 4.4 g/dL (ref 3.5–5.2)
ALK PHOS: 52 U/L (ref 39–117)
ALT: 5 U/L (ref 0–35)
AST: 13 U/L (ref 0–37)
BILIRUBIN DIRECT: 0.1 mg/dL (ref 0.0–0.3)
Total Bilirubin: 0.7 mg/dL (ref 0.2–1.2)
Total Protein: 7.8 g/dL (ref 6.0–8.3)

## 2015-05-02 LAB — URINALYSIS, ROUTINE W REFLEX MICROSCOPIC
Bilirubin Urine: NEGATIVE
HGB URINE DIPSTICK: NEGATIVE
Ketones, ur: NEGATIVE
Nitrite: NEGATIVE
PH: 6 (ref 5.0–8.0)
RBC / HPF: NONE SEEN (ref 0–?)
SPECIFIC GRAVITY, URINE: 1.01 (ref 1.000–1.030)
TOTAL PROTEIN, URINE-UPE24: NEGATIVE
Urine Glucose: NEGATIVE
Urobilinogen, UA: 0.2 (ref 0.0–1.0)

## 2015-05-02 LAB — TSH: TSH: 0.32 u[IU]/mL — AB (ref 0.35–4.50)

## 2015-05-09 ENCOUNTER — Other Ambulatory Visit (INDEPENDENT_AMBULATORY_CARE_PROVIDER_SITE_OTHER): Payer: BC Managed Care – PPO

## 2015-05-09 ENCOUNTER — Ambulatory Visit (INDEPENDENT_AMBULATORY_CARE_PROVIDER_SITE_OTHER): Payer: BC Managed Care – PPO | Admitting: Internal Medicine

## 2015-05-09 ENCOUNTER — Encounter: Payer: Self-pay | Admitting: Internal Medicine

## 2015-05-09 VITALS — BP 110/78 | HR 69 | Temp 98.7°F | Wt 167.0 lb

## 2015-05-09 DIAGNOSIS — E785 Hyperlipidemia, unspecified: Secondary | ICD-10-CM

## 2015-05-09 DIAGNOSIS — Z23 Encounter for immunization: Secondary | ICD-10-CM | POA: Diagnosis not present

## 2015-05-09 DIAGNOSIS — R7989 Other specified abnormal findings of blood chemistry: Secondary | ICD-10-CM

## 2015-05-09 DIAGNOSIS — I10 Essential (primary) hypertension: Secondary | ICD-10-CM

## 2015-05-09 DIAGNOSIS — Z0000 Encounter for general adult medical examination without abnormal findings: Secondary | ICD-10-CM | POA: Diagnosis not present

## 2015-05-09 DIAGNOSIS — F32A Depression, unspecified: Secondary | ICD-10-CM

## 2015-05-09 DIAGNOSIS — F329 Major depressive disorder, single episode, unspecified: Secondary | ICD-10-CM

## 2015-05-09 LAB — TSH: TSH: 0.46 u[IU]/mL (ref 0.35–4.50)

## 2015-05-09 LAB — T4, FREE: Free T4: 0.92 ng/dL (ref 0.60–1.60)

## 2015-05-09 MED ORDER — CLONAZEPAM 0.5 MG PO TABS: 0.5000 mg | ORAL_TABLET | Freq: Two times a day (BID) | ORAL | Status: DC | PRN

## 2015-05-09 MED ORDER — AMLODIPINE BESYLATE 5 MG PO TABS: 5.0000 mg | ORAL_TABLET | Freq: Every day | ORAL | Status: DC

## 2015-05-09 MED ORDER — CITALOPRAM HYDROBROMIDE 10 MG PO TABS: 10.0000 mg | ORAL_TABLET | Freq: Every day | ORAL | Status: DC

## 2015-05-09 MED ORDER — LISINOPRIL 20 MG PO TABS: 20.0000 mg | ORAL_TABLET | Freq: Every day | ORAL | Status: DC

## 2015-05-09 MED ORDER — PANTOPRAZOLE SODIUM 40 MG PO TBEC: 40.0000 mg | DELAYED_RELEASE_TABLET | Freq: Every day | ORAL | Status: DC

## 2015-05-09 NOTE — Assessment & Plan Note (Signed)
Mild worsening this yr, for lower chol diet, declines statin for now

## 2015-05-09 NOTE — Progress Notes (Signed)
Pre visit review using our clinic review tool, if applicable. No additional management support is needed unless otherwise documented below in the visit note. 

## 2015-05-09 NOTE — Progress Notes (Signed)
Subjective:    Patient ID: [name], female    DOB: 03-07-1966, 49 y.o.   MRN: [id number]  HPI   Here for wellness and f/u;  Overall doing ok;  Pt denies Chest pain, worsening SOB, DOE, wheezing, orthopnea, PND, worsening LE edema, palpitations, dizziness or syncope.  Pt denies neurological change such as new headache, facial or extremity weakness.  Pt denies polydipsia, polyuria, or low sugar symptoms. Pt states overall good compliance with treatment and medications, good tolerability, and has been trying to follow appropriate diet.  Pt denies worsening depressive symptoms, suicidal ideation or panic. No fever, night sweats, wt loss, loss of appetite, or other constitutional symptoms.  Pt states good ability with ADL's, has low fall risk, home safety reviewed and adequate, no other significant changes in hearing or vision, and only occasionally active with exercise.  Son has new dx wit paranoid schizophrenia on risperdal and mood stablizer.  Had been off the celexa benzo but more stress recenlty, now re-started.   Past Medical History  Diagnosis Date  . Depression   . Obesity   . Allergic rhinitis   . Hyperplastic colon polyp 10/2003  . RLS (restless legs syndrome)   . Menorrhagia   . Allergy    Past Surgical History  Procedure Laterality Date  . Tubal ligation    . Endometrial ablation  2007    Dr. Landry Mellow at [facility]  . Salivary gland surgery  12-11    LEFT SIDE NODULE REMOVED WAS BENIGN  . Colonoscopy    . Foot surgery      reports that she has never smoked. She has never used smokeless tobacco. She reports that she drinks alcohol. She reports that she does not use illicit drugs. family history includes Cancer in her mother; Colon polyps in her mother; Heart attack in her maternal grandfather; Hyperlipidemia in her other; Hypertension in her other. There is no history of Esophageal cancer, Rectal cancer, or Stomach cancer. Allergies  Allergen Reactions  . Other     POLLEN AND  DUST   Current Outpatient Prescriptions on File Prior to Visit  Medication Sig Dispense Refill  . albuterol (PROVENTIL HFA;VENTOLIN HFA) 108 (90 BASE) MCG/ACT inhaler Inhale 2 puffs into the lungs every 6 (six) hours as needed. Shortness of breath 1 Inhaler 5  . amLODipine (NORVASC) 5 MG tablet TAKE ONE TABLET BY MOUTH ONCE DAILY 90 tablet 3  . clonazePAM (KLONOPIN) 0.5 MG tablet TAKE ONE TABLET BY MOUTH TWICE DAILY AS NEEDED FOR ANXIETY 60 tablet 1  . ibuprofen (ADVIL,MOTRIN) 600 MG tablet Take 1 tablet (600 mg total) by mouth every 8 (eight) hours as needed. 60 tablet 1  . Linaclotide (LINZESS) 145 MCG CAPS capsule Take 1 capsule (145 mcg total) by mouth daily. 30 capsule 11  . lisinopril (PRINIVIL,ZESTRIL) 20 MG tablet Take 1 tablet (20 mg total) by mouth daily. 90 tablet 1  . pantoprazole (PROTONIX) 40 MG tablet TAKE ONE TABLET BY MOUTH ONCE DAILY 90 tablet 2  . [DISCONTINUED] fexofenadine (ALLEGRA) 180 MG tablet Take 1 tablet (180 mg total) by mouth daily. 30 tablet 11  . [DISCONTINUED] fluticasone (FLONASE) 50 MCG/ACT nasal spray 2 sprays by Nasal route daily. 16 g 2   No current facility-administered medications on file prior to visit.    Review of Systems Constitutional: Negative for increased diaphoresis, other activity, appetite or siginficant weight change other than noted HENT: Negative for worsening hearing loss, ear pain, facial swelling, mouth sores and neck stiffness.  Eyes: Negative for other worsening pain, redness or visual disturbance.  Respiratory: Negative for shortness of breath and wheezing  Cardiovascular: Negative for chest pain and palpitations.  Gastrointestinal: Negative for diarrhea, blood in stool, abdominal distention or other pain Genitourinary: Negative for hematuria, flank pain or change in urine volume.  Musculoskeletal: Negative for myalgias or other joint complaints.  Skin: Negative for color change and wound or drainage.  Neurological: Negative for  syncope and numbness. other than noted Hematological: Negative for adenopathy. or other swelling Psychiatric/Behavioral: Negative for hallucinations, SI, self-injury, decreased concentration or other worsening agitation.      Objective:   Physical Exam BP 110/78 mmHg  Pulse 69  Temp(Src) 98.7 F (37.1 C)  Wt 167 lb (75.751 kg)  SpO2 98% VS noted,  Constitutional: Pt is oriented to person, place, and time. Appears well-developed and well-nourished, in no significant distress Head: Normocephalic and atraumatic.  Right Ear: External ear normal.  Left Ear: External ear normal.  Nose: Nose normal.  Mouth/Throat: Oropharynx is clear and moist.  Eyes: Conjunctivae and EOM are normal. Pupils are equal, round, and reactive to light.  Neck: Normal range of motion. Neck supple. No JVD present. No tracheal deviation present or significant neck LA or mass Cardiovascular: Normal rate, regular rhythm, normal heart sounds and intact distal pulses.   Pulmonary/Chest: Effort normal and breath sounds without rales or wheezing  Abdominal: Soft. Bowel sounds are normal. NT. No HSM  Musculoskeletal: Normal range of motion. Exhibits no edema.  Lymphadenopathy:  Has no cervical adenopathy.  Neurological: Pt is alert and oriented to person, place, and time. Pt has normal reflexes. No cranial nerve deficit. Motor grossly intact Skin: Skin is warm and dry. No rash noted.  Psychiatric:  Has mild dysphoric mood and affect. Behavior is normal.     Assessment & Plan:

## 2015-05-09 NOTE — Assessment & Plan Note (Signed)
stable overall by history and exam, recent data reviewed with pt, and pt to continue medical treatment as before,  to f/u any worsening symptoms or concerns BP Readings from Last 3 Encounters:  05/09/15 110/78  03/21/15 140/80  02/08/15 141/92

## 2015-05-09 NOTE — Assessment & Plan Note (Signed)
Ok for repeat labs including free t4, tsh,  to f/u any worsening symptoms or concerns

## 2015-05-09 NOTE — Assessment & Plan Note (Signed)

## 2015-05-09 NOTE — Patient Instructions (Addendum)

## 2015-05-09 NOTE — Assessment & Plan Note (Signed)
Some improved with re-start meds, to cont as is

## 2015-05-23 ENCOUNTER — Telehealth: Payer: Self-pay | Admitting: Internal Medicine

## 2015-05-23 MED ORDER — SERTRALINE HCL 50 MG PO TABS: 50.0000 mg | ORAL_TABLET | Freq: Every day | ORAL | Status: DC

## 2015-05-23 NOTE — Telephone Encounter (Signed)
Pt advised via VM 

## 2015-05-23 NOTE — Telephone Encounter (Signed)
It is possible that sleepiness is due to the celexa  OK to change to zoloft 50 qd, which does not do this, thanks

## 2015-05-23 NOTE — Telephone Encounter (Signed)
Pt states the citalopram (CELEXA) 10 MG tablet [[id number] is making her sleepy and wondering if there is an alternative Pharmacy is [facility] on [address]

## 2015-06-16 ENCOUNTER — Other Ambulatory Visit: Payer: Self-pay | Admitting: Internal Medicine

## 2015-07-07 ENCOUNTER — Other Ambulatory Visit: Payer: Self-pay

## 2015-07-07 DIAGNOSIS — Z1231 Encounter for screening mammogram for malignant neoplasm of breast: Secondary | ICD-10-CM

## 2015-08-07 ENCOUNTER — Ambulatory Visit: Payer: BC Managed Care – PPO

## 2015-08-08 ENCOUNTER — Ambulatory Visit (INDEPENDENT_AMBULATORY_CARE_PROVIDER_SITE_OTHER): Payer: BC Managed Care – PPO | Admitting: Internal Medicine

## 2015-08-08 ENCOUNTER — Telehealth: Payer: Self-pay | Admitting: Internal Medicine

## 2015-08-08 VITALS — BP 130/86 | HR 88 | Temp 98.6°F | Ht 61.0 in | Wt 164.0 lb

## 2015-08-08 DIAGNOSIS — F329 Major depressive disorder, single episode, unspecified: Secondary | ICD-10-CM

## 2015-08-08 DIAGNOSIS — I10 Essential (primary) hypertension: Secondary | ICD-10-CM

## 2015-08-08 DIAGNOSIS — J069 Acute upper respiratory infection, unspecified: Secondary | ICD-10-CM | POA: Diagnosis not present

## 2015-08-08 DIAGNOSIS — F32A Depression, unspecified: Secondary | ICD-10-CM

## 2015-08-08 MED ORDER — FLUCONAZOLE 150 MG PO TABS: ORAL_TABLET | ORAL | Status: DC

## 2015-08-08 MED ORDER — HYDROCODONE-HOMATROPINE 5-1.5 MG/5ML PO SYRP: 5.0000 mL | ORAL_SOLUTION | Freq: Four times a day (QID) | ORAL | Status: DC | PRN

## 2015-08-08 MED ORDER — AZITHROMYCIN 250 MG PO TABS: ORAL_TABLET | ORAL | Status: DC

## 2015-08-08 NOTE — Telephone Encounter (Signed)
rx done erx 

## 2015-08-08 NOTE — Progress Notes (Signed)
Pre visit review using our clinic review tool, if applicable. No additional management support is needed unless otherwise documented below in the visit note. 

## 2015-08-08 NOTE — Patient Instructions (Signed)
Please take all new medication as prescribed - the antibiotic, and cough medicine if needed  You can also take Mucinex (or it's generic off brand) for congestion, and tylenol as needed for pain.  Please continue all other medications as before, and refills have been done if requested.  Please have the pharmacy call with any other refills you may need.  Please keep your appointments with your specialists as you may have planned  You are given the work note today

## 2015-08-08 NOTE — Telephone Encounter (Signed)
States she just left the office and Dr. Jenny Reichmann was to send in diflucan to [facility] on [address].  States pharmacy does not have this.

## 2015-08-08 NOTE — Progress Notes (Signed)
Subjective:    Patient ID: [name], female    DOB: 1966-02-10, 50 y.o.   MRN: [id number]  HPI   Here with 2-3 days acute onset fever, facial pain, pressure, headache, general weakness and malaise, and greenish d/c, with mild ST and cough, but pt denies chest pain, wheezing, increased sob or doe, orthopnea, PND, increased LE swelling, palpitations, dizziness or syncope. HA worse to bending over, BP ok today. Denies worsening depressive symptoms, suicidal ideation, or panic Past Medical History  Diagnosis Date  . Depression   . Obesity   . Allergic rhinitis   . Hyperplastic colon polyp 10/2003  . RLS (restless legs syndrome)   . Menorrhagia   . Allergy    Past Surgical History  Procedure Laterality Date  . Tubal ligation    . Endometrial ablation  2007    Dr. Landry Mellow at [facility]  . Salivary gland surgery  12-11    LEFT SIDE NODULE REMOVED WAS BENIGN  . Colonoscopy    . Foot surgery      reports that she has never smoked. She has never used smokeless tobacco. She reports that she drinks alcohol. She reports that she does not use illicit drugs. family history includes Cancer in her mother; Colon polyps in her mother; Heart attack in her maternal grandfather; Hyperlipidemia in her other; Hypertension in her other. There is no history of Esophageal cancer, Rectal cancer, or Stomach cancer. Allergies  Allergen Reactions  . Other     POLLEN AND DUST   Current Outpatient Prescriptions on File Prior to Visit  Medication Sig Dispense Refill  . albuterol (PROVENTIL HFA;VENTOLIN HFA) 108 (90 BASE) MCG/ACT inhaler Inhale 2 puffs into the lungs every 6 (six) hours as needed. Shortness of breath 1 Inhaler 5  . amLODipine (NORVASC) 5 MG tablet Take 1 tablet (5 mg total) by mouth daily. 90 tablet 3  . clonazePAM (KLONOPIN) 0.5 MG tablet Take 1 tablet (0.5 mg total) by mouth 2 (two) times daily as needed. for anxiety 60 tablet 2  . ibuprofen (ADVIL,MOTRIN) 600 MG tablet Take 1 tablet (600 mg  total) by mouth every 8 (eight) hours as needed. 60 tablet 1  . LINZESS 145 MCG CAPS capsule TAKE ONE CAPSULE BY MOUTH ONCE DAILY 30 capsule 0  . lisinopril (PRINIVIL,ZESTRIL) 20 MG tablet Take 1 tablet (20 mg total) by mouth daily. 90 tablet 3  . pantoprazole (PROTONIX) 40 MG tablet Take 1 tablet (40 mg total) by mouth daily. 90 tablet 3  . sertraline (ZOLOFT) 50 MG tablet Take 1 tablet (50 mg total) by mouth daily. 90 tablet 3  . [DISCONTINUED] fexofenadine (ALLEGRA) 180 MG tablet Take 1 tablet (180 mg total) by mouth daily. 30 tablet 11  . [DISCONTINUED] fluticasone (FLONASE) 50 MCG/ACT nasal spray 2 sprays by Nasal route daily. 16 g 2   No current facility-administered medications on file prior to visit.   Review of Systems  Constitutional: Negative for unusual diaphoresis or night sweats HENT: Negative for ringing in ear or discharge Eyes: Negative for double vision or worsening visual disturbance.  Respiratory: Negative for choking and stridor.   Gastrointestinal: Negative for vomiting or other signifcant bowel change Genitourinary: Negative for hematuria or change in urine volume.  Musculoskeletal: Negative for other MSK pain or swelling Skin: Negative for color change and worsening wound.  Neurological: Negative for tremors and numbness other than noted  Psychiatric/Behavioral: Negative for decreased concentration or agitation other than above  Objective:   Physical Exam BP 130/86 mmHg  Pulse 88  Temp(Src) 98.6 F (37 C) (Oral)  Ht 5\' 1"  (1.549 m)  Wt 164 lb (74.39 kg)  BMI 31.00 kg/m2  SpO2 99%  LMP 08/02/2015 BP 130/86 mmHg  Pulse 88  Temp(Src) 98.6 F (37 C) (Oral)  Ht 5\' 1"  (1.549 m)  Wt 164 lb (74.39 kg)  BMI 31.00 kg/m2  SpO2 99%  LMP 08/02/2015 VS noted, mild ill Constitutional: Pt appears in no significant distress HENT: Head: NCAT.  Right Ear: External ear normal.  Left Ear: External ear normal.  Eyes: . Pupils are equal, round, and reactive to  light. Conjunctivae and EOM are normal Bilat tm's with mild erythema.  Max sinus areas mild tender.  Pharynx with mild erythema, no exudate Neck: Normal range of motion. Neck supple.  Cardiovascular: Normal rate and regular rhythm.   Pulmonary/Chest: Effort normal and breath sounds without rales or wheezing.  Neurological: Pt is alert. Not confused , motor grossly intact Skin: Skin is warm. No rash, no LE edema Psychiatric: Pt behavior is normal. No agitation. not depressed affect    Assessment & Plan:

## 2015-08-09 NOTE — Assessment & Plan Note (Signed)
stable overall by history and exam, and pt to continue medical treatment as before,  to f/u any worsening symptoms or concerns 

## 2015-08-09 NOTE — Assessment & Plan Note (Signed)
Mild to mod, for antibx course,  to f/u any worsening symptoms or concerns 

## 2015-08-09 NOTE — Assessment & Plan Note (Signed)
stable overall by history and exam, recent data reviewed with pt, and pt to continue medical treatment as before,  to f/u any worsening symptoms or concerns BP Readings from Last 3 Encounters:  08/08/15 130/86  05/09/15 110/78  03/21/15 140/80

## 2015-08-29 ENCOUNTER — Other Ambulatory Visit: Payer: Self-pay | Admitting: Internal Medicine

## 2015-09-08 ENCOUNTER — Ambulatory Visit: Payer: BC Managed Care – PPO

## 2015-09-22 ENCOUNTER — Ambulatory Visit (INDEPENDENT_AMBULATORY_CARE_PROVIDER_SITE_OTHER): Payer: BC Managed Care – PPO | Admitting: Internal Medicine

## 2015-09-22 ENCOUNTER — Encounter: Payer: Self-pay | Admitting: Internal Medicine

## 2015-09-22 VITALS — BP 136/86 | HR 70 | Temp 98.8°F | Resp 20 | Wt 164.0 lb

## 2015-09-22 DIAGNOSIS — F411 Generalized anxiety disorder: Secondary | ICD-10-CM | POA: Diagnosis not present

## 2015-09-22 DIAGNOSIS — I10 Essential (primary) hypertension: Secondary | ICD-10-CM

## 2015-09-22 DIAGNOSIS — F329 Major depressive disorder, single episode, unspecified: Secondary | ICD-10-CM

## 2015-09-22 DIAGNOSIS — R5383 Other fatigue: Secondary | ICD-10-CM | POA: Diagnosis not present

## 2015-09-22 DIAGNOSIS — R400 Somnolence: Secondary | ICD-10-CM | POA: Insufficient documentation

## 2015-09-22 DIAGNOSIS — F32A Depression, unspecified: Secondary | ICD-10-CM

## 2015-09-22 MED ORDER — SERTRALINE HCL 100 MG PO TABS: 100.0000 mg | ORAL_TABLET | Freq: Every day | ORAL | Status: DC

## 2015-09-22 NOTE — Assessment & Plan Note (Signed)
Mild situational worsening recent, declines further tx for now

## 2015-09-22 NOTE — Patient Instructions (Addendum)
Ok to increase the zoloft to 100 mg per day  Please continue all other medications as before, and refills have been done if requested.  Please have the pharmacy call with any other refills you may need.  Please keep your appointments with your specialists as you may have planned  You are given the work note today

## 2015-09-22 NOTE — Assessment & Plan Note (Signed)
stable overall by history and exam, recent data reviewed with pt, and pt to continue medical treatment as before,  to f/u any worsening symptoms or concerns BP Readings from Last 3 Encounters:  09/22/15 136/86  08/08/15 130/86  05/09/15 110/78

## 2015-09-22 NOTE — Progress Notes (Signed)
Pre visit review using our clinic review tool, if applicable. No additional management support is needed unless otherwise documented below in the visit note. 

## 2015-09-22 NOTE — Assessment & Plan Note (Signed)
Though pt resists label of worsening depression, she is ok with increased zoloft to 100 qd, declines referral for counseling or psychiatry for now

## 2015-09-22 NOTE — Assessment & Plan Note (Signed)
Etiology unclear, suspect related to psychiatric difficulties but pt denies worsening depression, declines further labs today, No SI or HI, work note given for 1 wk Lab Results  Component Value Date   WBC 7.9 05/02/2015   HGB 13.0 05/02/2015   HCT 39.2 05/02/2015   PLT 201.0 05/02/2015   GLUCOSE 90 05/02/2015   CHOL 203* 05/02/2015   TRIG 69.0 05/02/2015   HDL 45.30 05/02/2015   LDLDIRECT 156.0 04/17/2013   LDLCALC 144* 05/02/2015   ALT 5 05/02/2015   AST 13 05/02/2015   NA 141 05/02/2015   K 4.4 05/02/2015   CL 106 05/02/2015   CREATININE 0.79 05/02/2015   BUN 10 05/02/2015   CO2 28 05/02/2015   TSH 0.46 05/09/2015
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
## 2015-10-28 ENCOUNTER — Ambulatory Visit
Admission: RE | Admit: 2015-10-28 | Discharge: 2015-10-28 | Disposition: A | Discharge status: Home / Self Care | Payer: BC Managed Care – PPO | Source: Ambulatory Visit

## 2015-10-28 DIAGNOSIS — Z1231 Encounter for screening mammogram for malignant neoplasm of breast: Secondary | ICD-10-CM

## 2015-10-29 ENCOUNTER — Encounter: Payer: Self-pay | Admitting: Women's Health

## 2015-11-04 ENCOUNTER — Encounter: Payer: Self-pay | Admitting: Internal Medicine

## 2015-11-04 ENCOUNTER — Ambulatory Visit (INDEPENDENT_AMBULATORY_CARE_PROVIDER_SITE_OTHER): Payer: BC Managed Care – PPO | Admitting: Internal Medicine

## 2015-11-04 VITALS — HR 103 | Temp 98.0°F | Resp 20 | Wt 160.0 lb

## 2015-11-04 DIAGNOSIS — J309 Allergic rhinitis, unspecified: Secondary | ICD-10-CM

## 2015-11-04 DIAGNOSIS — I10 Essential (primary) hypertension: Secondary | ICD-10-CM | POA: Diagnosis not present

## 2015-11-04 DIAGNOSIS — F411 Generalized anxiety disorder: Secondary | ICD-10-CM | POA: Diagnosis not present

## 2015-11-04 MED ORDER — FEXOFENADINE HCL 180 MG PO TABS: 180.0000 mg | ORAL_TABLET | Freq: Every day | ORAL | Status: DC

## 2015-11-04 MED ORDER — PREDNISONE 10 MG PO TABS: ORAL_TABLET | ORAL | Status: DC

## 2015-11-04 MED ORDER — TRIAMCINOLONE ACETONIDE 55 MCG/ACT NA AERO: 2.0000 | INHALATION_SPRAY | Freq: Every day | NASAL | Status: DC

## 2015-11-04 NOTE — Progress Notes (Signed)
Pre visit review using our clinic review tool, if applicable. No additional management support is needed unless otherwise documented below in the visit note. 

## 2015-11-04 NOTE — Assessment & Plan Note (Signed)
Mild to mod, for predpac asd, allegra, nasacort, mucinex otc prn, to f/u any worsening symptoms or concerns

## 2015-11-04 NOTE — Assessment & Plan Note (Signed)
stable overall by history and exam, recent data reviewed with pt, and pt to continue medical treatment as before,  to f/u any worsening symptoms or concerns BP Readings from Last 3 Encounters:  09/22/15 136/86  08/08/15 130/86  05/09/15 110/78

## 2015-11-04 NOTE — Progress Notes (Signed)
Subjective:    Patient ID: [name], female    DOB: February 03, 1966, 50 y.o.   MRN: [id number]  HPI  Here with c/o dizziness and room spinning with nausea, no vomiting, mild intermittent, worse to lie down,  Assoc with allergy symptoms - Some sneeze, cough, HA, has felt warm. Does have several wks ongoing nasal allergy symptoms with clearish congestion, itch and sneezing, without fever, pain, ST, cough, swelling or wheezing. Pt denies chest pain, increased sob or doe, wheezing, orthopnea, PND, increased LE swelling, palpitations, dizziness or syncope.  Pt denies new neurological symptoms such as new headache, or facial or extremity weakness or numbness   Pt denies polydipsia, polyuria Did Reduced her zoloft down to 50 again b/c felt "too much energy"  Denies worsening depressive symptoms, suicidal ideation, or panic Past Medical History  Diagnosis Date  . Depression   . Obesity   . Allergic rhinitis   . Hyperplastic colon polyp 10/2003  . RLS (restless legs syndrome)   . Menorrhagia   . Allergy    Past Surgical History  Procedure Laterality Date  . Tubal ligation    . Endometrial ablation  2007    Dr. Landry Mellow at [facility]  . Salivary gland surgery  12-11    LEFT SIDE NODULE REMOVED WAS BENIGN  . Colonoscopy    . Foot surgery      reports that she has never smoked. She has never used smokeless tobacco. She reports that she drinks alcohol. She reports that she does not use illicit drugs. family history includes Cancer in her mother; Colon polyps in her mother; Heart attack in her maternal grandfather; Hyperlipidemia in her other; Hypertension in her other. There is no history of Esophageal cancer, Rectal cancer, or Stomach cancer. Allergies  Allergen Reactions  . Other     POLLEN AND DUST   Current Outpatient Prescriptions on File Prior to Visit  Medication Sig Dispense Refill  . albuterol (PROVENTIL HFA;VENTOLIN HFA) 108 (90 BASE) MCG/ACT inhaler Inhale 2 puffs into the lungs every 6  (six) hours as needed. Shortness of breath 1 Inhaler 5  . amLODipine (NORVASC) 5 MG tablet Take 1 tablet (5 mg total) by mouth daily. 90 tablet 3  . amLODipine (NORVASC) 5 MG tablet TAKE ONE TABLET BY MOUTH ONCE DAILY 90 tablet 1  . clonazePAM (KLONOPIN) 0.5 MG tablet Take 1 tablet (0.5 mg total) by mouth 2 (two) times daily as needed. for anxiety 60 tablet 2  . fluconazole (DIFLUCAN) 150 MG tablet 1 tab by mouth every 3 days 2 tablet 1  . HYDROcodone-homatropine (HYCODAN) 5-1.5 MG/5ML syrup Take 5 mLs by mouth every 6 (six) hours as needed for cough. 180 mL 0  . ibuprofen (ADVIL,MOTRIN) 600 MG tablet Take 1 tablet (600 mg total) by mouth every 8 (eight) hours as needed. 60 tablet 1  . LINZESS 145 MCG CAPS capsule TAKE ONE CAPSULE BY MOUTH ONCE DAILY 30 capsule 0  . lisinopril (PRINIVIL,ZESTRIL) 20 MG tablet Take 1 tablet (20 mg total) by mouth daily. 90 tablet 3  . pantoprazole (PROTONIX) 40 MG tablet Take 1 tablet (40 mg total) by mouth daily. 90 tablet 3  . sertraline (ZOLOFT) 100 MG tablet Take 1 tablet (100 mg total) by mouth daily. 90 tablet 3  . [DISCONTINUED] fluticasone (FLONASE) 50 MCG/ACT nasal spray 2 sprays by Nasal route daily. 16 g 2   No current facility-administered medications on file prior to visit.   Review of Systems  Constitutional: Negative for  unusual diaphoresis or night sweats HENT: Negative for ear swelling or discharge Eyes: Negative for worsening visual haziness  Respiratory: Negative for choking and stridor.   Gastrointestinal: Negative for distension or worsening eructation Genitourinary: Negative for retention or change in urine volume.  Musculoskeletal: Negative for other MSK pain or swelling Skin: Negative for color change and worsening wound Neurological: Negative for tremors and numbness other than noted  Psychiatric/Behavioral: Negative for decreased concentration or agitation other than above       Objective:   Physical Exam Pulse 103  Temp(Src) 98  F (36.7 C) (Oral)  Resp 20  Wt 160 lb (72.576 kg)  SpO2 99% VS noted,  Constitutional: Pt appears in no apparent distress HENT: Head: NCAT.  Right Ear: External ear normal.  Left Ear: External ear normal.  Bilat tm's with mild erythema.  Max sinus areas mild tender.  Pharynx with mild erythema, no exudate Eyes: . Pupils are equal, round, and reactive to light. Conjunctivae and EOM are normal Neck: Normal range of motion. Neck supple.  Cardiovascular: Normal rate and regular rhythm.   Pulmonary/Chest: Effort normal and breath sounds without rales or wheezing.  Abd:  Soft, NT, ND, + BS Neurological: Pt is alert. Not confused , motor 5/5 intact, sens/dtr intact, cbl intact Skin: Skin is warm. No rash, no LE edema Psychiatric: Pt behavior is normal. No agitation.     Assessment & Plan:

## 2015-11-04 NOTE — Patient Instructions (Signed)
Please take all new medication as prescribed - the prednisone  Please continue all other medications including the allegra and nasacort  You can also take Delsym OTC for cough, and/or Mucinex (or it's generic off brand) for congestion, and tylenol as needed for pain.  Please have the pharmacy call with any other refills you may need.  Please keep your appointments with your specialists as you may have planned  You are given the work note today

## 2015-11-04 NOTE — Assessment & Plan Note (Signed)
stable overall by history and exam, recent data reviewed with pt, and pt to continue medical treatment as before,  to f/u any worsening symptoms or concerns Lab Results  Component Value Date   WBC 7.9 05/02/2015   HGB 13.0 05/02/2015   HCT 39.2 05/02/2015   PLT 201.0 05/02/2015   GLUCOSE 90 05/02/2015   CHOL 203* 05/02/2015   TRIG 69.0 05/02/2015   HDL 45.30 05/02/2015   LDLDIRECT 156.0 04/17/2013   LDLCALC 144* 05/02/2015   ALT 5 05/02/2015   AST 13 05/02/2015   NA 141 05/02/2015   K 4.4 05/02/2015   CL 106 05/02/2015   CREATININE 0.79 05/02/2015   BUN 10 05/02/2015   CO2 28 05/02/2015   TSH 0.46 05/09/2015

## 2015-11-11 ENCOUNTER — Ambulatory Visit: Payer: BC Managed Care – PPO | Admitting: Internal Medicine

## 2015-11-16 ENCOUNTER — Emergency Department (HOSPITAL_COMMUNITY): Payer: BC Managed Care – PPO

## 2015-11-16 ENCOUNTER — Emergency Department (HOSPITAL_COMMUNITY)
Admission: EM | Admit: 2015-11-16 | Discharge: 2015-11-16 | Disposition: A | Discharge status: Home / Self Care | Payer: BC Managed Care – PPO | Attending: Emergency Medicine | Admitting: Emergency Medicine

## 2015-11-16 ENCOUNTER — Encounter (HOSPITAL_COMMUNITY): Payer: Self-pay | Admitting: *Deleted

## 2015-11-16 DIAGNOSIS — E669 Obesity, unspecified: Secondary | ICD-10-CM | POA: Diagnosis not present

## 2015-11-16 DIAGNOSIS — J069 Acute upper respiratory infection, unspecified: Secondary | ICD-10-CM | POA: Insufficient documentation

## 2015-11-16 DIAGNOSIS — F329 Major depressive disorder, single episode, unspecified: Secondary | ICD-10-CM | POA: Diagnosis not present

## 2015-11-16 DIAGNOSIS — Z791 Long term (current) use of non-steroidal anti-inflammatories (NSAID): Secondary | ICD-10-CM | POA: Diagnosis not present

## 2015-11-16 DIAGNOSIS — R05 Cough: Secondary | ICD-10-CM | POA: Diagnosis present

## 2015-11-16 DIAGNOSIS — Z792 Long term (current) use of antibiotics: Secondary | ICD-10-CM | POA: Insufficient documentation

## 2015-11-16 DIAGNOSIS — Z7951 Long term (current) use of inhaled steroids: Secondary | ICD-10-CM | POA: Insufficient documentation

## 2015-11-16 DIAGNOSIS — Z7952 Long term (current) use of systemic steroids: Secondary | ICD-10-CM | POA: Diagnosis not present

## 2015-11-16 DIAGNOSIS — Z79899 Other long term (current) drug therapy: Secondary | ICD-10-CM | POA: Insufficient documentation

## 2015-11-16 MED ORDER — AMOXICILLIN 500 MG PO CAPS: 1000.0000 mg | ORAL_CAPSULE | Freq: Two times a day (BID) | ORAL | Status: DC

## 2015-11-16 MED ORDER — FLUCONAZOLE 150 MG PO TABS: 150.0000 mg | ORAL_TABLET | Freq: Once | ORAL | Status: DC

## 2015-11-16 NOTE — ED Provider Notes (Signed)
CSN: [id number]     Arrival date & time 11/16/15  1240 History  By signing my name below, I, Julien Nordmann, attest that this documentation has been prepared under the direction and in the presence of [employer], PA-C. Electronically Signed: Julien Nordmann, ED Scribe. 11/16/2015. 2:03 PM.    Chief Complaint  Patient presents with  . Cough  . Nasal Congestion     The history is provided by the patient. No language interpreter was used.   HPI Comments: [name] is a 50 y.o. female who presents to the Emergency Department complaining of sudden onset, gradual worsening, moderate, cold-like symptoms onset two days ago. She reports having associated loss of appetite, bilateral ear pain, nausea, productive cough that brings up yellow sputum, nasal congestion with back pain and generalized abdominal pain secondary to coughing. Pt reports she was seen by her PCP last week and was treated with Prednisone for her allergies and finished her last dose on Friday. She has not taken any medication to alleviate her symptoms. She denies sinus pressure, sore throat, shortness of breath, vomiting, or diarrhea.  Past Medical History  Diagnosis Date  . Depression   . Obesity   . Allergic rhinitis   . Hyperplastic colon polyp 10/2003  . RLS (restless legs syndrome)   . Menorrhagia   . Allergy    Past Surgical History  Procedure Laterality Date  . Tubal ligation    . Endometrial ablation  2007    Dr. Landry Mellow at [facility]  . Salivary gland surgery  12-11    LEFT SIDE NODULE REMOVED WAS BENIGN  . Colonoscopy    . Foot surgery     Family History  Problem Relation Age of Onset  . Cancer Mother     colon cancer at 69 yo  . Colon polyps Mother   . Hyperlipidemia Other   . Hypertension Other   . Heart attack Maternal Grandfather   . Esophageal cancer Neg Hx   . Rectal cancer Neg Hx   . Stomach cancer Neg Hx    Social History  Substance Use Topics  . Smoking status: Never Smoker   .  Smokeless tobacco: Never Used  . Alcohol Use: 0.0 oz/week    0 Standard drinks or equivalent per week     Comment: some   OB History    Gravida Para Term Preterm AB TAB SAB Ectopic Multiple Living   2 2        2      Review of Systems  A complete 10 system review of systems was obtained and all systems are negative except as noted in the HPI and PMH.    Allergies  Other  Home Medications   Prior to Admission medications   Medication Sig Start Date End Date Taking? Authorizing Provider  albuterol (PROVENTIL HFA;VENTOLIN HFA) 108 (90 BASE) MCG/ACT inhaler Inhale 2 puffs into the lungs every 6 (six) hours as needed. Shortness of breath 03/20/14  Yes Biagio Borg, MD  amLODipine (NORVASC) 5 MG tablet Take 1 tablet (5 mg total) by mouth daily. 05/09/15  Yes Biagio Borg, MD  clonazePAM (KLONOPIN) 0.5 MG tablet Take 1 tablet (0.5 mg total) by mouth 2 (two) times daily as needed. for anxiety Patient taking differently: Take 0.5 mg by mouth 2 (two) times daily as needed for anxiety. for anxiety 05/09/15  Yes Biagio Borg, MD  fexofenadine (ALLEGRA) 180 MG tablet Take 1 tablet (180 mg total) by mouth daily. 11/04/15  Yes Biagio Borg, MD  LINZESS 145 MCG CAPS capsule TAKE ONE CAPSULE BY MOUTH ONCE DAILY 06/16/15  Yes Biagio Borg, MD  lisinopril (PRINIVIL,ZESTRIL) 20 MG tablet Take 1 tablet (20 mg total) by mouth daily. 05/09/15  Yes Biagio Borg, MD  sertraline (ZOLOFT) 100 MG tablet Take 1 tablet (100 mg total) by mouth daily. 09/22/15  Yes Biagio Borg, MD  triamcinolone (NASACORT AQ) 55 MCG/ACT AERO nasal inhaler Place 2 sprays into the nose daily. Patient taking differently: Place 2 sprays into the nose daily as needed (allergies).  11/04/15  Yes Biagio Borg, MD  amoxicillin (AMOXIL) 500 MG capsule Take 2 capsules (1,000 mg total) by mouth 2 (two) times daily. 11/16/15   Caris Cerveny, PA-C  fluconazole (DIFLUCAN) 150 MG tablet 1 tab by mouth every 3 days Patient not taking: Reported on  11/16/2015 08/08/15   Biagio Borg, MD  HYDROcodone-homatropine [facility]) 5-1.5 MG/5ML syrup Take 5 mLs by mouth every 6 (six) hours as needed for cough. Patient not taking: Reported on 11/16/2015 08/08/15   Biagio Borg, MD  ibuprofen (ADVIL,MOTRIN) 600 MG tablet Take 1 tablet (600 mg total) by mouth every 8 (eight) hours as needed. Patient not taking: Reported on 11/16/2015 03/21/15   Huel Cote, NP  pantoprazole (PROTONIX) 40 MG tablet Take 1 tablet (40 mg total) by mouth daily. Patient not taking: Reported on 11/16/2015 05/09/15   Biagio Borg, MD  predniSONE (DELTASONE) 10 MG tablet 3 tabs by mouth per day for 3 days,2tabs per day for 3 days,1tab per day for 3 days Patient not taking: Reported on 11/16/2015 11/04/15   Biagio Borg, MD   Triage vitals: BP 149/95 mmHg  Pulse 88  Temp(Src) 98.1 F (36.7 C)  SpO2 96%  LMP 11/01/2015 Physical Exam  Constitutional: She is oriented to person, place, and time. She appears well-developed and well-nourished. No distress.  HENT:  Head: Normocephalic.  No drooling or stridor. Posterior pharynx mildly erythematous no significant tonsillar hypertrophy. No exudate. Soft palate rises symmetrically. No TTP or induration under tongue.   No tenderness to palpation of frontal or bilateral maxillary sinuses.  ++mucosal edema in the nares. ++Rhinorrhea ++ Bilateral tympanic membranes erythematous with slight bulging and dull light reflex     Eyes: Conjunctivae and EOM are normal.  Neck: Normal range of motion.  Cardiovascular: Normal rate.   Pulmonary/Chest: Effort normal. No stridor.  Abdominal: She exhibits no distension.  Musculoskeletal: Normal range of motion.  Neurological: She is alert and oriented to person, place, and time.  Psychiatric: She has a normal mood and affect.  Nursing note and vitals reviewed.   ED Course  Procedures  DIAGNOSTIC STUDIES: Oxygen Saturation is 96% on RA, normal by my interpretation.  COORDINATION OF CARE:   2:00 PM Will order chest x-ray. Discussed treatment plan with pt at bedside and pt agreed to plan.  Labs Review Labs Reviewed - No data to display  Imaging Review Dg Chest 2 View  11/16/2015  CLINICAL DATA:  Productive cough for proximally 2 days. Right upper quadrant pain. EXAM: CHEST  2 VIEW COMPARISON:  08/06/2013 FINDINGS: The heart size and mediastinal contours are within normal limits. Both lungs are clear. The visualized skeletal structures are unremarkable. IMPRESSION: No active cardiopulmonary disease. Electronically Signed   By: Kathreen Devoid   On: 11/16/2015 14:55   I have personally reviewed and evaluated these images and lab results as part of my medical decision-making.  EKG Interpretation None      MDM   Final diagnoses:  URI (upper respiratory infection)    Filed Vitals:   11/16/15 1256  BP: 149/95  Pulse: 88  Temp: 98.1 F (36.7 C)  SpO2: 96%    [name] is 50 y.o. female presenting with Productive cough and significant rhinorrhea. Lung sounds clear to auscultation, she saturating well on room air. Chest x-rays without infiltrate. Bilateral tympanic membranes are erythematous with dull light reflex, likely early otitis media. Patient will be started on care physician.  Evaluation does not show pathology that would require ongoing emergent intervention or inpatient treatment. Pt is hemodynamically stable and mentating appropriately. Discussed findings and plan with patient/guardian, who agrees with care plan. All questions answered. Return precautions discussed and outpatient follow up given.   New Prescriptions   AMOXICILLIN (AMOXIL) 500 MG CAPSULE    Take 2 capsules (1,000 mg total) by mouth 2 (two) times daily.     I personally performed the services described in this documentation, which was scribed in my presence. The recorded information has been reviewed and is accurate.   Monico Blitz, PA-C 11/16/15 1508  Dorie Rank, MD 11/16/15 [phone]

## 2015-11-16 NOTE — Discharge Instructions (Signed)
Use nasal saline (you can try Arm and Hammer Simply Saline) at least 4 times a day, use saline 5-10 minutes before using the fluticasone (flonase) nasal spray  Do not use Afrin (Oxymetazoline)  Rest, wash hands frequently  and drink plenty of water.  You may try over the counter medication such as Mucinex or decongestants.  Push fluids to try to thin the mucus and get it to flow out the nose.   Please follow with your primary care doctor in the next 2 days for a check-up. They must obtain records for further management.   Do not hesitate to return to the Emergency Department for any new, worsening or concerning symptoms.    Upper Respiratory Infection, Adult Most upper respiratory infections (URIs) are a viral infection of the air passages leading to the lungs. A URI affects the nose, throat, and upper air passages. The most common type of URI is nasopharyngitis and is typically referred to as "the common cold." URIs run their course and usually go away on their own. Most of the time, a URI does not require medical attention, but sometimes a bacterial infection in the upper airways can follow a viral infection. This is called a secondary infection. Sinus and middle ear infections are common types of secondary upper respiratory infections. Bacterial pneumonia can also complicate a URI. A URI can worsen asthma and chronic obstructive pulmonary disease (COPD). Sometimes, these complications can require emergency medical care and may be life threatening.  CAUSES Almost all URIs are caused by viruses. A virus is a type of germ and can spread from one person to another.  RISKS FACTORS You may be at risk for a URI if:   You smoke.   You have chronic heart or lung disease.  You have a weakened defense (immune) system.   You are very young or very old.   You have nasal allergies or asthma.  You work in crowded or poorly ventilated areas.  You work in health care facilities or  schools. SIGNS AND SYMPTOMS  Symptoms typically develop 2-3 days after you come in contact with a cold virus. Most viral URIs last 7-10 days. However, viral URIs from the influenza virus (flu virus) can last 14-18 days and are typically more severe. Symptoms may include:   Runny or stuffy (congested) nose.   Sneezing.   Cough.   Sore throat.   Headache.   Fatigue.   Fever.   Loss of appetite.   Pain in your forehead, behind your eyes, and over your cheekbones (sinus pain).  Muscle aches.  DIAGNOSIS  Your health care provider may diagnose a URI by:  Physical exam.  Tests to check that your symptoms are not due to another condition such as:  Strep throat.  Sinusitis.  Pneumonia.  Asthma. TREATMENT  A URI goes away on its own with time. It cannot be cured with medicines, but medicines may be prescribed or recommended to relieve symptoms. Medicines may help:  Reduce your fever.  Reduce your cough.  Relieve nasal congestion. HOME CARE INSTRUCTIONS   Take medicines only as directed by your health care provider.   Gargle warm saltwater or take cough drops to comfort your throat as directed by your health care provider.  Use a warm mist humidifier or inhale steam from a shower to increase air moisture. This may make it easier to breathe.  Drink enough fluid to keep your urine clear or pale yellow.   Eat soups and other clear broths  and maintain good nutrition.   Rest as needed.   Return to work when your temperature has returned to normal or as your health care provider advises. You may need to stay home longer to avoid infecting others. You can also use a face mask and careful hand washing to prevent spread of the virus.  Increase the usage of your inhaler if you have asthma.   Do not use any tobacco products, including cigarettes, chewing tobacco, or electronic cigarettes. If you need help quitting, ask your health care provider. PREVENTION   The best way to protect yourself from getting a cold is to practice good hygiene.   Avoid oral or hand contact with people with cold symptoms.   Wash your hands often if contact occurs.  There is no clear evidence that vitamin C, vitamin E, echinacea, or exercise reduces the chance of developing a cold. However, it is always recommended to get plenty of rest, exercise, and practice good nutrition.  SEEK MEDICAL CARE IF:   You are getting worse rather than better.   Your symptoms are not controlled by medicine.   You have chills.  You have worsening shortness of breath.  You have brown or red mucus.  You have yellow or brown nasal discharge.  You have pain in your face, especially when you bend forward.  You have a fever.  You have swollen neck glands.  You have pain while swallowing.  You have white areas in the back of your throat. SEEK IMMEDIATE MEDICAL CARE IF:   You have severe or persistent:  Headache.  Ear pain.  Sinus pain.  Chest pain.  You have chronic lung disease and any of the following:  Wheezing.  Prolonged cough.  Coughing up blood.  A change in your usual mucus.  You have a stiff neck.  You have changes in your:  Vision.  Hearing.  Thinking.  Mood. MAKE SURE YOU:   Understand these instructions.  Will watch your condition.  Will get help right away if you are not doing well or get worse.   This information is not intended to replace advice given to you by your health care provider. Make sure you discuss any questions you have with your health care provider.   Document Released: 12/29/2000 Document Revised: 11/19/2014 Document Reviewed: 10/10/2013 Elsevier Interactive Patient Education [employer].

## 2015-11-16 NOTE — ED Notes (Signed)
Patient was alert, oriented and stable upon discharge. RN went over AVS and patient had no further questions.  

## 2015-11-16 NOTE — ED Notes (Signed)
Patient transported to X-ray 

## 2015-11-16 NOTE — ED Notes (Addendum)
Pt complains of cough and congestion since Friday, back pain and rib pain that is worse with coughing since this morning. Pt states she was recently treated for allergies and just finished her last dose of prednisone.

## 2015-11-20 ENCOUNTER — Telehealth: Payer: Self-pay

## 2015-11-20 ENCOUNTER — Ambulatory Visit (INDEPENDENT_AMBULATORY_CARE_PROVIDER_SITE_OTHER): Payer: BC Managed Care – PPO | Admitting: Internal Medicine

## 2015-11-20 ENCOUNTER — Encounter: Payer: Self-pay | Admitting: Internal Medicine

## 2015-11-20 VITALS — BP 136/82 | HR 86 | Temp 98.7°F | Resp 20 | Wt 169.0 lb

## 2015-11-20 DIAGNOSIS — K5909 Other constipation: Secondary | ICD-10-CM

## 2015-11-20 DIAGNOSIS — E785 Hyperlipidemia, unspecified: Secondary | ICD-10-CM

## 2015-11-20 DIAGNOSIS — I10 Essential (primary) hypertension: Secondary | ICD-10-CM | POA: Diagnosis not present

## 2015-11-20 DIAGNOSIS — J069 Acute upper respiratory infection, unspecified: Secondary | ICD-10-CM

## 2015-11-20 DIAGNOSIS — K5900 Constipation, unspecified: Secondary | ICD-10-CM | POA: Diagnosis not present

## 2015-11-20 DIAGNOSIS — R6889 Other general symptoms and signs: Secondary | ICD-10-CM

## 2015-11-20 DIAGNOSIS — Z0001 Encounter for general adult medical examination with abnormal findings: Secondary | ICD-10-CM

## 2015-11-20 MED ORDER — LINACLOTIDE 145 MCG PO CAPS
145.0000 ug | ORAL_CAPSULE | Freq: Every day | ORAL | Status: DC
Start: 2015-11-20 — End: 2016-01-08

## 2015-11-20 MED ORDER — AMLODIPINE BESYLATE 5 MG PO TABS: 5.0000 mg | ORAL_TABLET | Freq: Every day | ORAL | Status: DC

## 2015-11-20 MED ORDER — PANTOPRAZOLE SODIUM 40 MG PO TBEC: 40.0000 mg | DELAYED_RELEASE_TABLET | Freq: Every day | ORAL | Status: DC

## 2015-11-20 MED ORDER — LISINOPRIL 20 MG PO TABS: 20.0000 mg | ORAL_TABLET | Freq: Every day | ORAL | Status: DC

## 2015-11-20 MED ORDER — FLUCONAZOLE 150 MG PO TABS: 150.0000 mg | ORAL_TABLET | Freq: Once | ORAL | Status: DC

## 2015-11-20 MED ORDER — LINACLOTIDE 145 MCG PO CAPS: 145.0000 ug | ORAL_CAPSULE | Freq: Every day | ORAL | Status: DC

## 2015-11-20 NOTE — Progress Notes (Signed)
Pre visit review using our clinic review tool, if applicable. No additional management support is needed unless otherwise documented below in the visit note. 

## 2015-11-20 NOTE — Progress Notes (Signed)
Subjective:    Patient ID: [name], female    DOB: 03-31-1966, 50 y.o.   MRN: [id number]  HPI  Here to f/u; overall doing ok,  Pt denies chest pain, increasing sob or doe, wheezing, orthopnea, PND, increased LE swelling, palpitations, dizziness or syncope.  Pt denies new neurological symptoms such as new headache, or facial or extremity weakness or numbness.  Pt denies polydipsia, polyuria, or low sugar episode.   Pt denies new neurological symptoms such as new headache, or facial or extremity weakness or numbness.   Pt states overall good compliance with meds, mostly trying to follow appropriate diet, with wt overall stable,  but little exercise however.  Seen at ED apr 30 with URI, now feeling better just today, with less fever, cough. Does have vaginal d/c however with antibx, asks for diflucan.   Pt denies fever, wt loss, night sweats, loss of appetite, or other constitutional symptoms  Denies worsening reflux, abd pain, dysphagia, n/v, bowel change or blood except for chronic intermittent constipation, need med refill. Past Medical History  Diagnosis Date  . Depression   . Obesity   . Allergic rhinitis   . Hyperplastic colon polyp 10/2003  . RLS (restless legs syndrome)   . Menorrhagia   . Allergy    Past Surgical History  Procedure Laterality Date  . Tubal ligation    . Endometrial ablation  2007    Dr. Landry Mellow at [facility]  . Salivary gland surgery  12-11    LEFT SIDE NODULE REMOVED WAS BENIGN  . Colonoscopy    . Foot surgery      reports that she has never smoked. She has never used smokeless tobacco. She reports that she drinks alcohol. She reports that she does not use illicit drugs. family history includes Cancer in her mother; Colon polyps in her mother; Heart attack in her maternal grandfather; Hyperlipidemia in her other; Hypertension in her other. There is no history of Esophageal cancer, Rectal cancer, or Stomach cancer. Allergies  Allergen Reactions  . Other    POLLEN AND DUST   Current Outpatient Prescriptions on File Prior to Visit  Medication Sig Dispense Refill  . albuterol (PROVENTIL HFA;VENTOLIN HFA) 108 (90 BASE) MCG/ACT inhaler Inhale 2 puffs into the lungs every 6 (six) hours as needed. Shortness of breath 1 Inhaler 5  . clonazePAM (KLONOPIN) 0.5 MG tablet Take 1 tablet (0.5 mg total) by mouth 2 (two) times daily as needed. for anxiety (Patient taking differently: Take 0.5 mg by mouth 2 (two) times daily as needed for anxiety. for anxiety) 60 tablet 2  . fexofenadine (ALLEGRA) 180 MG tablet Take 1 tablet (180 mg total) by mouth daily. 90 tablet 2  . ibuprofen (ADVIL,MOTRIN) 600 MG tablet Take 1 tablet (600 mg total) by mouth every 8 (eight) hours as needed. 60 tablet 1  . sertraline (ZOLOFT) 100 MG tablet Take 1 tablet (100 mg total) by mouth daily. 90 tablet 3  . triamcinolone (NASACORT AQ) 55 MCG/ACT AERO nasal inhaler Place 2 sprays into the nose daily. (Patient taking differently: Place 2 sprays into the nose daily as needed (allergies). ) 1 Inhaler 12  . [DISCONTINUED] fluticasone (FLONASE) 50 MCG/ACT nasal spray 2 sprays by Nasal route daily. 16 g 2   No current facility-administered medications on file prior to visit.   Review of Systems  Constitutional: Negative for unusual diaphoresis or night sweats HENT: Negative for ear swelling or discharge Eyes: Negative for worsening visual haziness  Respiratory: Negative  for choking and stridor.   Gastrointestinal: Negative for distension or worsening eructation Genitourinary: Negative for retention or change in urine volume.  Musculoskeletal: Negative for other MSK pain or swelling Skin: Negative for color change and worsening wound Neurological: Negative for tremors and numbness other than noted  Psychiatric/Behavioral: Negative for decreased concentration or agitation other than above       Objective:   Physical Exam BP 136/82 mmHg  Pulse 86  Temp(Src) 98.7 F (37.1 C) (Oral)   Resp 20  Wt 169 lb (76.658 kg)  SpO2 98%  LMP 11/01/2015 VS noted,  Constitutional: Pt appears in no apparent distress HENT: Head: NCAT.  Right Ear: External ear normal.  Left Ear: External ear normal.  Eyes: . Pupils are equal, round, and reactive to light. Conjunctivae and EOM are normal Neck: Normal range of motion. Neck supple.  Cardiovascular: Normal rate and regular rhythm.   Pulmonary/Chest: Effort normal and breath sounds without rales or wheezing.  Abd:  Soft, NT, ND, + BS Neurological: Pt is alert. Not confused , motor grossly intact Skin: Skin is warm. No rash, no LE edema Psychiatric: Pt behavior is normal. No agitation.     Assessment & Plan:

## 2015-11-20 NOTE — Telephone Encounter (Signed)
Medications sent to pharmacy

## 2015-11-20 NOTE — Patient Instructions (Addendum)
Please take all new medication as prescribed - the diflucan  Please continue all other medications as before, and refills have been done if requested.  Please have the pharmacy call with any other refills you may need.  Please continue your efforts at being more active, low cholesterol diet, and weight control.  Please keep your appointments with your specialists as you may have planned  Please return in 6 months, or sooner if needed, with Lab testing done 3-5 days before

## 2015-11-22 NOTE — Assessment & Plan Note (Signed)
Improved/resolved, d/w pt - no further tx needed,  to f/u any worsening symptoms or concerns

## 2015-11-22 NOTE — Assessment & Plan Note (Signed)
stable overall by history and exam, and pt to continue medical treatment as before,  to f/u any worsening symptoms or concerns 

## 2015-11-22 NOTE — Assessment & Plan Note (Signed)
stable overall by history and exam, recent data reviewed with pt, and pt to continue medical treatment as before,  to f/u any worsening symptoms or concerns Lab Results  Component Value Date   LDLCALC 144* 05/02/2015   Cont diet, declines other tx,

## 2015-11-22 NOTE — Assessment & Plan Note (Signed)
stable overall by history and exam, recent data reviewed with pt, and pt to continue medical treatment as before,  to f/u any worsening symptoms or concerns BP Readings from Last 3 Encounters:  11/20/15 136/82  11/16/15 149/95  09/22/15 136/86

## 2015-12-01 ENCOUNTER — Other Ambulatory Visit: Payer: Self-pay | Admitting: Internal Medicine

## 2015-12-02 NOTE — Telephone Encounter (Signed)
Medication sent to pharmacy  

## 2015-12-02 NOTE — Telephone Encounter (Signed)
Done hardcopy to Corinne  

## 2016-01-08 ENCOUNTER — Ambulatory Visit (INDEPENDENT_AMBULATORY_CARE_PROVIDER_SITE_OTHER): Payer: Self-pay | Admitting: Urgent Care

## 2016-01-08 VITALS — BP 126/80 | HR 82 | Temp 98.4°F | Resp 16 | Ht 61.0 in | Wt 168.8 lb

## 2016-01-08 DIAGNOSIS — Z021 Encounter for pre-employment examination: Secondary | ICD-10-CM

## 2016-01-08 DIAGNOSIS — F329 Major depressive disorder, single episode, unspecified: Secondary | ICD-10-CM

## 2016-01-08 DIAGNOSIS — I10 Essential (primary) hypertension: Secondary | ICD-10-CM

## 2016-01-08 DIAGNOSIS — F419 Anxiety disorder, unspecified: Secondary | ICD-10-CM

## 2016-01-08 DIAGNOSIS — Z024 Encounter for examination for driving license: Secondary | ICD-10-CM

## 2016-01-08 DIAGNOSIS — F418 Other specified anxiety disorders: Secondary | ICD-10-CM

## 2016-01-08 NOTE — Patient Instructions (Addendum)
Hypertension Hypertension, commonly called high blood pressure, is when the force of blood pumping through your arteries is too strong. Your arteries are the blood vessels that carry blood from your heart throughout your body. A blood pressure reading consists of a higher number over a lower number, such as 110/72. The higher number (systolic) is the pressure inside your arteries when your heart pumps. The lower number (diastolic) is the pressure inside your arteries when your heart relaxes. Ideally you want your blood pressure below 120/80. Hypertension forces your heart to work harder to pump blood. Your arteries may become narrow or stiff. Having untreated or uncontrolled hypertension can cause heart attack, stroke, kidney disease, and other problems. RISK FACTORS Some risk factors for high blood pressure are controllable. Others are not.  Risk factors you cannot control include:   Race. You may be at higher risk if you are African American.  Age. Risk increases with age.  Gender. Men are at higher risk than women before age 45 years. After age 65, women are at higher risk than men. Risk factors you can control include:  Not getting enough exercise or physical activity.  Being overweight.  Getting too much fat, sugar, calories, or salt in your diet.  Drinking too much alcohol. SIGNS AND SYMPTOMS Hypertension does not usually cause signs or symptoms. Extremely high blood pressure (hypertensive crisis) may cause headache, anxiety, shortness of breath, and nosebleed. DIAGNOSIS To check if you have hypertension, your health care provider will measure your blood pressure while you are seated, with your arm held at the level of your heart. It should be measured at least twice using the same arm. Certain conditions can cause a difference in blood pressure between your right and left arms. A blood pressure reading that is higher than normal on one occasion does not mean that you need treatment. If  it is not clear whether you have high blood pressure, you may be asked to return on a different day to have your blood pressure checked again. Or, you may be asked to monitor your blood pressure at home for 1 or more weeks. TREATMENT Treating high blood pressure includes making lifestyle changes and possibly taking medicine. Living a healthy lifestyle can help lower high blood pressure. You may need to change some of your habits. Lifestyle changes may include:  Following the DASH diet. This diet is high in fruits, vegetables, and whole grains. It is low in salt, red meat, and added sugars.  Keep your sodium intake below 2,300 mg per day.  Getting at least 30-45 minutes of aerobic exercise at least 4 times per week.  Losing weight if necessary.  Not smoking.  Limiting alcoholic beverages.  Learning ways to reduce stress. Your health care provider may prescribe medicine if lifestyle changes are not enough to get your blood pressure under control, and if one of the following is true:  You are 18-59 years of age and your systolic blood pressure is above 140.  You are 60 years of age or older, and your systolic blood pressure is above 150.  Your diastolic blood pressure is above 90.  You have diabetes, and your systolic blood pressure is over 140 or your diastolic blood pressure is over 90.  You have kidney disease and your blood pressure is above 140/90.  You have heart disease and your blood pressure is above 140/90. Your personal target blood pressure may vary depending on your medical conditions, your age, and other factors. HOME CARE INSTRUCTIONS    Have your blood pressure rechecked as directed by your health care provider.   Take medicines only as directed by your health care provider. Follow the directions carefully. Blood pressure medicines must be taken as prescribed. The medicine does not work as well when you skip doses. Skipping doses also puts you at risk for  problems.  Do not smoke.   Monitor your blood pressure at home as directed by your health care provider. SEEK MEDICAL CARE IF:   You think you are having a reaction to medicines taken.  You have recurrent headaches or feel dizzy.  You have swelling in your ankles.  You have trouble with your vision. SEEK IMMEDIATE MEDICAL CARE IF:  You develop a severe headache or confusion.  You have unusual weakness, numbness, or feel faint.  You have severe chest or abdominal pain.  You vomit repeatedly.  You have trouble breathing. MAKE SURE YOU:   Understand these instructions.  Will watch your condition.  Will get help right away if you are not doing well or get worse.   This information is not intended to replace advice given to you by your health care provider. Make sure you discuss any questions you have with your health care provider.   Document Released: 07/05/2005 Document Revised: 11/19/2014 Document Reviewed: 04/27/2013 Elsevier Interactive Patient Education 2016 Elsevier Inc[employer]    Generalized Anxiety Disorder Generalized anxiety disorder (GAD) is a mental disorder. It interferes with life functions, including relationships, work, and school. GAD is different from normal anxiety, which everyone experiences at some point in their lives in response to specific life events and activities. Normal anxiety actually helps us prepare for and get through these life events and activities. Normal anxiety goes away after the event or activity is over.  GAD causes anxiety that is not necessarily related to specific events or activities. It also causes excess anxiety in proportion to specific events or activities. The anxiety associated with GAD is also difficult to control. GAD can vary from mild to severe. People with severe GAD can have intense waves of anxiety with physical symptoms (panic attacks).  SYMPTOMS The anxiety and worry associated with GAD are difficult to control. This  anxiety and worry are related to many life events and activities and also occur more days than not for 6 months or longer. People with GAD also have three or more of the following symptoms (one or more in children):  Restlessness.   Fatigue.  Difficulty concentrating.   Irritability.  Muscle tension.  Difficulty sleeping or unsatisfying sleep. DIAGNOSIS GAD is diagnosed through an assessment by your health care provider. Your health care provider will ask you questions aboutyour mood,physical symptoms, and events in your life. Your health care provider may ask you about your medical history and use of alcohol or drugs, including prescription medicines. Your health care provider may also do a physical exam and blood tests. Certain medical conditions and the use of certain substances can cause symptoms similar to those associated with GAD. Your health care provider may refer you to a mental health specialist for further evaluation. TREATMENT The following therapies are usually used to treat GAD:   Medication. Antidepressant medication usually is prescribed for long-term daily control. Antianxiety medicines may be added in severe cases, especially when panic attacks occur.   Talk therapy (psychotherapy). Certain types of talk therapy can be helpful in treating GAD by providing support, education, and guidance. A form of talk therapy called cognitive behavioral therapy can teach you healthy  ways to think about and react to daily life events and activities.  Stress managementtechniques. These include yoga, meditation, and exercise and can be very helpful when they are practiced regularly. A mental health specialist can help determine which treatment is best for you. Some people see improvement with one therapy. However, other people require a combination of therapies.   This information is not intended to replace advice given to you by your health care provider. Make sure you discuss any  questions you have with your health care provider.   Document Released: 10/30/2012 Document Revised: 07/26/2014 Document Reviewed: 10/30/2012 Elsevier Interactive Patient Education [employer].    IF you received an x-ray today, you will receive an invoice from [facility]. Please contact [facility] at [phone] with questions or concerns regarding your invoice.   IF you received labwork today, you will receive an invoice from [employer]. Please contact [facility] at [phone] with questions or concerns regarding your invoice.   Our billing staff will not be able to assist you with questions regarding bills from these companies.  You will be contacted with the lab results as soon as they are available. The fastest way to get your results is to activate your My Chart account. Instructions are located on the last page of this paperwork. If you have not heard from [place] regarding the results in 2 weeks, please contact this office.

## 2016-01-08 NOTE — Progress Notes (Signed)
Commercial Driver Medical Examination   [name] is a 50 y.o. female who presents today for a DOT physical exam. The patient reports HTN managed with amlodipine, lisinopril and Anxiety/Depression managed with sertraline, clonazepam at night. Denies lightheadedness, dizziness, chronic headache, double vision, chest pain, shortness of breath, heart racing, palpitations, nausea, vomiting, abdominal pain, hematuria, lower leg swelling. She has a history of right forearm fracture without sequelae.  The following portions of the patient's history were reviewed and updated as appropriate: allergies, current medications, past family history, past medical history, past social history and past surgical history.  Objective:   BP 126/80 mmHg  Pulse 82  Temp(Src) 98.4 F (36.9 C) (Oral)  Resp 16  Ht 5\' 1"  (1.549 m)  Wt 168 lb 12.8 oz (76.567 kg)  BMI 31.91 kg/m2  SpO2 99%  Vision/hearing:  Visual Acuity Screening   Right eye Left eye Both eyes  Without correction: 20/20 20/20 20/20   With correction:     Comments: Right eye 85 degrees. Left eye 85 degrees. The patient can distinguish the colors red, amber and green. The patient was able to hear a forced whisper from L=10 R=10 feet.   Applicant can recognize and distinguish among traffic control signals and devices showing standard red, green, and amber colors.  Corrective lenses required: No  Monocular Vision?: No  Hearing aid requirement: No  Physical Exam  Constitutional: She is oriented to person, place, and time. She appears well-developed and well-nourished.  HENT:  TM's intact bilaterally, no effusions or erythema. Nasal turbinates pink and moist, nasal passages patent. No sinus tenderness. Oropharynx clear, mucous membranes moist, dentition in good repair.  Eyes: Conjunctivae and EOM are normal. Pupils are equal, round, and reactive to light. Right eye exhibits no discharge. Left eye exhibits no discharge. No scleral icterus.   Neck: Normal range of motion. Neck supple. No thyromegaly present.  Cardiovascular: Normal rate, regular rhythm and intact distal pulses.  Exam reveals no gallop and no friction rub.   No murmur heard. Pulmonary/Chest: No respiratory distress. She has no wheezes. She has no rales.  Abdominal: Soft. Bowel sounds are normal. She exhibits no distension and no mass. There is no tenderness.  Musculoskeletal: Normal range of motion. She exhibits no edema or tenderness.  Lymphadenopathy:    She has no cervical adenopathy.  Neurological: She is alert and oriented to person, place, and time. She has normal reflexes.  Skin: Skin is warm and dry. No rash noted. No erythema. No pallor.  Psychiatric: She has a normal mood and affect.   Labs: Comments: SPGR:1.000,PRO:neg,Blood:neg,sugar:neg  Assessment:    Healthy female exam.  Meets standards, but periodic monitoring required due to HTN.  Driver qualified only for 1 year.    Plan:    Medical examiners certificate completed and printed. Return as needed.

## 2016-01-22 ENCOUNTER — Other Ambulatory Visit: Payer: Self-pay | Admitting: Internal Medicine

## 2016-02-18 ENCOUNTER — Telehealth: Payer: Self-pay | Admitting: *Deleted

## 2016-02-18 NOTE — Telephone Encounter (Signed)
Left msg on triage a her medications. Couldn't hear the msg due to noise & phone breaking up. Called pt back no answer LMOM RTC...[name]

## 2016-02-19 MED ORDER — HYDROCHLOROTHIAZIDE 12.5 MG PO CAPS: 12.5000 mg | ORAL_CAPSULE | Freq: Every day | ORAL | 1 refills | Status: DC

## 2016-02-19 NOTE — Telephone Encounter (Signed)
Pt call back she states she is needing refill on her HCTZ. Tried to get refill but pharmacy states rx has expired. Verified pharmacy inform will send to [place]...Johny Chess

## 2016-03-11 ENCOUNTER — Encounter: Payer: Self-pay | Admitting: Internal Medicine

## 2016-03-11 ENCOUNTER — Ambulatory Visit (INDEPENDENT_AMBULATORY_CARE_PROVIDER_SITE_OTHER): Payer: BC Managed Care – PPO | Admitting: Internal Medicine

## 2016-03-11 VITALS — BP 130/72 | HR 90 | Temp 98.9°F | Resp 20 | Wt 171.0 lb

## 2016-03-11 DIAGNOSIS — J019 Acute sinusitis, unspecified: Secondary | ICD-10-CM | POA: Diagnosis not present

## 2016-03-11 DIAGNOSIS — M549 Dorsalgia, unspecified: Secondary | ICD-10-CM | POA: Insufficient documentation

## 2016-03-11 DIAGNOSIS — M546 Pain in thoracic spine: Secondary | ICD-10-CM | POA: Diagnosis not present

## 2016-03-11 DIAGNOSIS — J309 Allergic rhinitis, unspecified: Secondary | ICD-10-CM | POA: Diagnosis not present

## 2016-03-11 DIAGNOSIS — I10 Essential (primary) hypertension: Secondary | ICD-10-CM | POA: Diagnosis not present

## 2016-03-11 DIAGNOSIS — F32A Depression, unspecified: Secondary | ICD-10-CM

## 2016-03-11 DIAGNOSIS — F329 Major depressive disorder, single episode, unspecified: Secondary | ICD-10-CM

## 2016-03-11 MED ORDER — FLUCONAZOLE 150 MG PO TABS: ORAL_TABLET | ORAL | 1 refills | Status: DC

## 2016-03-11 MED ORDER — LEVOFLOXACIN 500 MG PO TABS: 500.0000 mg | ORAL_TABLET | Freq: Every day | ORAL | 0 refills | Status: AC

## 2016-03-11 MED ORDER — CYCLOBENZAPRINE HCL 5 MG PO TABS: 5.0000 mg | ORAL_TABLET | Freq: Three times a day (TID) | ORAL | 1 refills | Status: DC | PRN

## 2016-03-11 NOTE — Assessment & Plan Note (Signed)
stable overall by history and exam, recent data reviewed with pt, and pt to continue medical treatment as before,  to f/u any worsening symptoms or concerns BP Readings from Last 3 Encounters:  03/11/16 130/72  01/08/16 126/80  11/20/15 136/82

## 2016-03-11 NOTE — Assessment & Plan Note (Signed)
Mild to mod, for antibx course,  to f/u any worsening symptoms or concerns 

## 2016-03-11 NOTE — Assessment & Plan Note (Signed)
/  stable overall by history and exam, recent data reviewed with pt, and pt to continue medical treatment as before,  to f/u any worsening symptoms or concerns Lab Results  Component Value Date   WBC 7.9 05/02/2015   HGB 13.0 05/02/2015   HCT 39.2 05/02/2015   PLT 201.0 05/02/2015   GLUCOSE 90 05/02/2015   CHOL 203 (H) 05/02/2015   TRIG 69.0 05/02/2015   HDL 45.30 05/02/2015   LDLDIRECT 156.0 04/17/2013   LDLCALC 144 (H) 05/02/2015   ALT 5 05/02/2015   AST 13 05/02/2015   NA 141 05/02/2015   K 4.4 05/02/2015   CL 106 05/02/2015   CREATININE 0.79 05/02/2015   BUN 10 05/02/2015   CO2 28 05/02/2015   TSH 0.46 05/09/2015

## 2016-03-11 NOTE — Progress Notes (Signed)
Pre visit review using our clinic review tool, if applicable. No additional management support is needed unless otherwise documented below in the visit note. 

## 2016-03-11 NOTE — Assessment & Plan Note (Signed)
C/w msk strain, for muscle relaxer prn,  to f/u any worsening symptoms or concerns 

## 2016-03-11 NOTE — Progress Notes (Signed)
Subjective:    Patient ID: [name], female    DOB: July 25, 1965, 50 y.o.   MRN: [id number]  HPI   Here with 2-3 days acute onset fever, facial pain, pressure, headache, general weakness and malaise, and greenish d/c, with mild ST and cough, but pt denies chest pain, wheezing, increased sob or doe, orthopnea, PND, increased LE swelling, palpitations, dizziness or syncope.  Does have several wks ongoing nasal allergy symptoms with clearish congestion, itch and sneezing, without fever, pain, ST, cough, swelling or wheezing.  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  Denies worsening depressive symptoms, suicidal ideation, or panic Past Medical History:  Diagnosis Date  . Allergic rhinitis   . Allergy   . Depression   . Hyperplastic colon polyp 10/2003  . Menorrhagia   . Obesity   . RLS (restless legs syndrome)    Past Surgical History:  Procedure Laterality Date  . COLONOSCOPY    . ENDOMETRIAL ABLATION  2007   Dr. Landry Mellow at [facility]  . FOOT SURGERY    . SALIVARY GLAND SURGERY  12-11   LEFT SIDE NODULE REMOVED WAS BENIGN  . TUBAL LIGATION      reports that she has never smoked. She has never used smokeless tobacco. She reports that she drinks alcohol. She reports that she does not use drugs. family history includes Cancer in her mother; Colon polyps in her mother; Heart attack in her maternal grandfather; Hyperlipidemia in her other; Hypertension in her other. Allergies  Allergen Reactions  . Other     POLLEN AND DUST  . Sulfur Hives   Current Outpatient Prescriptions on File Prior to Visit  Medication Sig Dispense Refill  . albuterol (PROVENTIL HFA;VENTOLIN HFA) 108 (90 BASE) MCG/ACT inhaler Inhale 2 puffs into the lungs every 6 (six) hours as needed. Shortness of breath 1 Inhaler 5  . amLODipine (NORVASC) 5 MG tablet Take 1 tablet (5 mg total) by mouth daily. 90  tablet 3  . clonazePAM (KLONOPIN) 0.5 MG tablet TAKE ONE TABLET BY MOUTH TWICE DAILY AS NEEDED FOR ANXIETY 60 tablet 2  . hydrochlorothiazide (MICROZIDE) 12.5 MG capsule Take 1 capsule (12.5 mg total) by mouth daily. 90 capsule 1  . ibuprofen (ADVIL,MOTRIN) 600 MG tablet Take 1 tablet (600 mg total) by mouth every 8 (eight) hours as needed. 60 tablet 1  . lisinopril (PRINIVIL,ZESTRIL) 20 MG tablet Take 1 tablet (20 mg total) by mouth daily. 90 tablet 3  . sertraline (ZOLOFT) 100 MG tablet Take 1 tablet (100 mg total) by mouth daily. 90 tablet 3  . [DISCONTINUED] fluticasone (FLONASE) 50 MCG/ACT nasal spray 2 sprays by Nasal route daily. 16 g 2   No current facility-administered medications on file prior to visit.    Review of Systems  Constitutional: Negative for unusual diaphoresis or night sweats HENT: Negative for ear swelling or discharge Eyes: Negative for worsening visual haziness  Respiratory: Negative for choking and stridor.   Gastrointestinal: Negative for distension or worsening eructation Genitourinary: Negative for retention or change in urine volume.  Musculoskeletal: Negative for other MSK pain or swelling Skin: Negative for color change and worsening wound Neurological: Negative for tremors and numbness other than noted  Psychiatric/Behavioral: Negative for decreased concentration or agitation other than above       Objective:   Physical Exam BP 130/72   Pulse 90  Temp 98.9 F (37.2 C) (Oral)   Resp 20   Wt 171 lb (77.6 kg)   SpO2 97%   BMI 32.31 kg/m  VS noted, mild ill Constitutional: Pt appears in no apparent distress HENT: Head: NCAT.  Right Ear: External ear normal.  Left Ear: External ear normal.  Eyes: . Pupils are equal, round, and reactive to light. Conjunctivae and EOM are normal Bilat tm's with mild erythema.  Max sinus areas mild tender.  Pharynx with mild erythema, no exudate Neck: Normal range of motion. Neck supple.  Cardiovascular: Normal  rate and regular rhythm.   Pulmonary/Chest: Effort normal and breath sounds without rales or wheezing.  Spine nontender, has some mild msk tender spasm just medial to bilat scapula Neurological: Pt is alert. Not confused , motor grossly intact Skin: Skin is warm. No rash, no LE edema Psychiatric: Pt behavior is normal. No agitation. not depressed affect     Assessment & Plan:

## 2016-03-11 NOTE — Assessment & Plan Note (Signed)
Mild to mod, to restart nasal steroid,  to f/u any worsening symptoms or concerns 

## 2016-03-11 NOTE — Patient Instructions (Signed)
Please take all new medication as prescribed - the antibiotic, and diflucan if needed  Remember to take your nasal spray as well for allergies  Please take all new medication as prescribed- the muscle relaxer if needed  Please continue all other medications as before, and refills have been done if requested.  Please have the pharmacy call with any other refills you may need.  Please keep your appointments with your specialists as you may have planned

## 2016-04-20 ENCOUNTER — Other Ambulatory Visit: Payer: Self-pay | Admitting: Internal Medicine

## 2016-05-05 ENCOUNTER — Telehealth: Payer: Self-pay | Admitting: *Deleted

## 2016-05-05 ENCOUNTER — Other Ambulatory Visit: Payer: Self-pay | Admitting: Women's Health

## 2016-05-05 DIAGNOSIS — Z30011 Encounter for initial prescription of contraceptive pills: Secondary | ICD-10-CM

## 2016-05-05 MED ORDER — NORETHINDRONE 0.35 MG PO TABS: 1.0000 | ORAL_TABLET | Freq: Every day | ORAL | 4 refills | Status: DC

## 2016-05-05 NOTE — Telephone Encounter (Signed)
Pt called c/o bad menstrual cramps, cycle started on 05/03/16, states yesterday she stayed home from work due to cramps, taking OTC ibuprofen 900 mg, cutting pills in half to equal this dose. States today better, cycle typically last 7-10 days asked if there was a shot that could be given forcramping/fiborids I explained there is lupron, states last 2 months cramps have been rough. Has annual scheduled on 05/18/16 will follow up with you then to discuss options.

## 2016-05-05 NOTE — Telephone Encounter (Signed)
Message left

## 2016-05-05 NOTE — Telephone Encounter (Signed)
Message left, she had called back.

## 2016-05-05 NOTE — Telephone Encounter (Signed)
Telephone call, states cycles are monthly/BTL, last 2 cycles have been extremely heavy with cramps. Options reviewed, will try Micronor, history of hypertension on medication. Reviewed slight risk of blood clots and strokes. Prescription called in will start today take daily reviewed no placebo week. Will evaluate at annual exam.

## 2016-05-13 ENCOUNTER — Telehealth: Payer: Self-pay | Admitting: Gastroenterology

## 2016-05-13 ENCOUNTER — Telehealth: Payer: Self-pay | Admitting: *Deleted

## 2016-05-13 MED ORDER — VALSARTAN 160 MG PO TABS: 160.0000 mg | ORAL_TABLET | Freq: Every day | ORAL | 3 refills | Status: DC

## 2016-05-13 NOTE — Telephone Encounter (Signed)
Left message for patient to call back  

## 2016-05-13 NOTE — Telephone Encounter (Signed)
Pt left msg on triage stating the Lisinopril is giving her a dry hacking cough. Sometimes she have a coughing spell and she start to vomit. Requesting MD recommendation or alternative to take for BP...Johny Chess

## 2016-05-13 NOTE — Telephone Encounter (Signed)
Ok for change lisinopril to diovan 160 - done erx to [facility]

## 2016-05-14 NOTE — Telephone Encounter (Signed)
Pt advised.

## 2016-05-14 NOTE — Telephone Encounter (Signed)
Pt said she is returning [place] call from yesterday

## 2016-05-14 NOTE — Telephone Encounter (Signed)
Left message for pt to call back  °

## 2016-05-14 NOTE — Telephone Encounter (Signed)
Spoke with pt and let her know she could start with her PCP first and if he thinks she needs to see Dr. Fuller Plan he can refer her to GI. Pt has appt with PCP coming up and will keep that appt.

## 2016-05-18 ENCOUNTER — Encounter: Payer: Self-pay | Admitting: Women's Health

## 2016-05-18 ENCOUNTER — Ambulatory Visit (INDEPENDENT_AMBULATORY_CARE_PROVIDER_SITE_OTHER): Payer: BC Managed Care – PPO | Admitting: Women's Health

## 2016-05-18 VITALS — BP 140/80 | Ht 61.0 in | Wt 170.0 lb

## 2016-05-18 DIAGNOSIS — Z01419 Encounter for gynecological examination (general) (routine) without abnormal findings: Secondary | ICD-10-CM | POA: Diagnosis not present

## 2016-05-18 NOTE — Progress Notes (Signed)
[name] 1965/09/09 [id number]    History:    Presents for annual exam.  Amenorrheic on Micronor. History of BTL and ablation 2007 cycles became much lighter after, increased dysmenorrhea, history of a 3 cm fibroid. Normal Pap and mammogram history. 2010 benign colon polyp, 2015 negative colonoscopy 5 year follow-up, mother died of colon cancer age 48. Not sexually active greater than one year, negative STD screen 2016. Primary care manages hypertension, hypercholesterolemia and GERD.  Past medical history, past surgical history, family history and social history were all reviewed and documented in the EPIC chart. 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 23 year old son struggles with schizophrenia other son lives in [place] doing well.  ROS:  A ROS was performed and pertinent positives and negatives are included.  Exam:  Vitals:   05/18/16 1043  BP: 140/80  Weight: 170 lb (77.1 kg)  Height: 5\' 1"  (1.549 m)   Body mass index is 32.12 kg/m.   General appearance:  Normal Thyroid:  Symmetrical, normal in size, without palpable masses or nodularity. Respiratory  Auscultation:  Clear without wheezing or rhonchi Cardiovascular  Auscultation:  Regular rate, without rubs, murmurs or gallops  Edema/varicosities:  Not grossly evident Abdominal  Soft,nontender, without masses, guarding or rebound.  Liver/spleen:  No organomegaly noted  Hernia:  None appreciated  Skin  Inspection:  Grossly normal   Breasts: Examined lying and sitting.     Right: Without masses, retractions, discharge or axillary adenopathy.     Left: Without masses, retractions, discharge or axillary adenopathy. Gentitourinary   Inguinal/mons:  Normal without inguinal adenopathy  External genitalia:  Normal  BUS/Urethra/Skene's glands:  Normal  Vagina:  Normal  Cervix:  Normal  Uterus:  normal in size, shape and contour.  Midline and mobile  Adnexa/parametria:      Rt: Without masses or tenderness.   Lt: Without masses or tenderness.  Anus and perineum: Normal  Digital rectal exam: Normal sphincter tone without palpated masses or tenderness  Assessment/Plan:  50 y.o. SBF G2 P2 for annual exam with no complaints.  BTL/ablation 2007/ amenorrheic on Micronor 3 cm fibroid uterus-dysmenorrhea resolved on Micronor Hypercholesterolemia/hypertension/GERD/anxiety and depression-primary care manages      labs and meds  Plan: Micronor prescription, proper use, slight risk for blood clots and strokes reviewed. Will continue, amenorrheic with relief of dysmenorrhea. SBE's, continue annual screening mammogram, calcium rich diet, encouraged increased regular daily exercise. Condoms encouraged if sexually active. Leisure activities encouraged. UA, Pap normal 2015, new screening guidelines reviewed.     Huel Cote WHNP, 1:13 PM 05/18/2016

## 2016-05-18 NOTE — Patient Instructions (Signed)
Basic Carbohydrate Counting for Diabetes Mellitus Carbohydrate counting is a method for keeping track of the amount of carbohydrates you eat. Eating carbohydrates naturally increases the level of sugar (glucose) in your blood, so it is important for you to know the amount that is okay for you to have in every meal. Carbohydrate counting helps keep the level of glucose in your blood within normal limits. The amount of carbohydrates allowed is different for every person. A dietitian can help you calculate the amount that is right for you. Once you know the amount of carbohydrates you can have, you can count the carbohydrates in the foods you want to eat. Carbohydrates are found in the following foods:  Grains, such as breads and cereals.  Dried beans and soy products.  Starchy vegetables, such as potatoes, peas, and corn.  Fruit and fruit juices.  Milk and yogurt.  Sweets and snack foods, such as cake, cookies, candy, chips, soft drinks, and fruit drinks. CARBOHYDRATE COUNTING There are two ways to count the carbohydrates in your food. You can use either of the methods or a combination of both. Reading the "Nutrition Facts" on [place] The "Nutrition Facts" is an area that is included on the labels of almost all packaged food and beverages in the [place]. It includes the serving size of that food or beverage and information about the nutrients in each serving of the food, including the grams (g) of carbohydrate per serving.  Decide the number of servings of this food or beverage that you will be able to eat or drink. Multiply that number of servings by the number of grams of carbohydrate that is listed on the label for that serving. The total will be the amount of carbohydrates you will be having when you eat or drink this food or beverage. Learning Standard Serving Sizes of Food When you eat food that is not packaged or does not include "Nutrition Facts" on the label, you need to  measure the servings in order to count the amount of carbohydrates.A serving of most carbohydrate-rich foods contains about 15 g of carbohydrates. The following list includes serving sizes of carbohydrate-rich foods that provide 15 g ofcarbohydrate per serving:   1 slice of bread (1 oz) or 1 six-inch tortilla.    of a hamburger bun or English muffin.  4-6 crackers.   cup unsweetened dry cereal.    cup hot cereal.   cup rice or pasta.    cup mashed potatoes or  of a large baked potato.  1 cup fresh fruit or one small piece of fruit.    cup canned or frozen fruit or fruit juice.  1 cup milk.   cup plain fat-free yogurt or yogurt sweetened with artificial sweeteners.   cup cooked dried beans or starchy vegetable, such as peas, corn, or potatoes.  Decide the number of standard-size servings that you will eat. Multiply that number of servings by 15 (the grams of carbohydrates in that serving). For example, if you eat 2 cups of strawberries, you will have eaten 2 servings and 30 g of carbohydrates (2 servings x 15 g = 30 g). For foods such as soups and casseroles, in which more than one food is mixed in, you will need to count the carbohydrates in each food that is included. EXAMPLE OF CARBOHYDRATE COUNTING Sample Dinner  3 oz chicken breast.   cup of brown rice.   cup of corn.  1 cup milk.   1 cup strawberries with  sugar-free whipped topping.  Carbohydrate Calculation Step 1: Identify the foods that contain carbohydrates:   Rice.   Corn.   Milk.   Strawberries. Step 2:Calculate the number of servings eaten of each:   2 servings of rice.   1 serving of corn.   1 serving of milk.   1 serving of strawberries. Step 3: Multiply each of those number of servings by 15 g:   2 servings of rice x 15 g = 30 g.   1 serving of corn x 15 g = 15 g.   1 serving of milk x 15 g = 15 g.   1 serving of strawberries x 15 g = 15 g. Step 4: Add  together all of the amounts to find the total grams of carbohydrates eaten: 30 g + 15 g + 15 g + 15 g = 75 g.   This information is not intended to replace advice given to you by your health care provider. Make sure you discuss any questions you have with your health care provider.   Document Released: 07/05/2005 Document Revised: 07/26/2014 Document Reviewed: 06/01/2013 Elsevier Interactive Patient Education 2016 Heath Maintenance, Female Adopting a healthy lifestyle and getting preventive care can go a long way to promote health and wellness. Talk with your health care provider about what schedule of regular examinations is right for you. This is a good chance for you to check in with your provider about disease prevention and staying healthy. In between checkups, there are plenty of things you can do on your own. Experts have done a lot of research about which lifestyle changes and preventive measures are most likely to keep you healthy. Ask your health care provider for more information. WEIGHT AND DIET  Eat a healthy diet  Be sure to include plenty of vegetables, fruits, low-fat dairy products, and lean protein.  Do not eat a lot of foods high in solid fats, added sugars, or salt.  Get regular exercise. This is one of the most important things you can do for your health.  Most adults should exercise for at least 150 minutes each week. The exercise should increase your heart rate and make you sweat (moderate-intensity exercise).  Most adults should also do strengthening exercises at least twice a week. This is in addition to the moderate-intensity exercise.  Maintain a healthy weight  Body mass index (BMI) is a measurement that can be used to identify possible weight problems. It estimates body fat based on height and weight. Your health care provider can help determine your BMI and help you achieve or maintain a healthy weight.  For females 87 years of age and older:    A BMI below 18.5 is considered underweight.  A BMI of 18.5 to 24.9 is normal.  A BMI of 25 to 29.9 is considered overweight.  A BMI of 30 and above is considered obese.  Watch levels of cholesterol and blood lipids  You should start having your blood tested for lipids and cholesterol at 48 years of age, then have this test every 5 years.  You may need to have your cholesterol levels checked more often if:  Your lipid or cholesterol levels are high.  You are older than 48 years of age.  You are at high risk for heart disease.  CANCER SCREENING   Lung Cancer  Lung cancer screening is recommended for adults 12-76 years old who are at high risk for lung cancer because of a history of  smoking.  A yearly low-dose CT scan of the lungs is recommended for people who:  Currently smoke.  Have quit within the past 15 years.  Have at least a 30-pack-year history of smoking. A pack year is smoking an average of one pack of cigarettes a day for 1 year.  Yearly screening should continue until it has been 15 years since you quit.  Yearly screening should stop if you develop a health problem that would prevent you from having lung cancer treatment.  Breast Cancer  Practice breast self-awareness. This means understanding how your breasts normally appear and feel.  It also means doing regular breast self-exams. Let your health care provider know about any changes, no matter how small.  If you are in your 20s or 30s, you should have a clinical breast exam (CBE) by a health care provider every 1-3 years as part of a regular health exam.  If you are 66 or older, have a CBE every year. Also consider having a breast X-ray (mammogram) every year.  If you have a family history of breast cancer, talk to your health care provider about genetic screening.  If you are at high risk for breast cancer, talk to your health care provider about having an MRI and a mammogram every year.  Breast  cancer gene (BRCA) assessment is recommended for women who have family members with BRCA-related cancers. BRCA-related cancers include:  Breast.  Ovarian.  Tubal.  Peritoneal cancers.  Results of the assessment will determine the need for genetic counseling and BRCA1 and BRCA2 testing. Cervical Cancer Your health care provider may recommend that you be screened regularly for cancer of the pelvic organs (ovaries, uterus, and vagina). This screening involves a pelvic examination, including checking for microscopic changes to the surface of your cervix (Pap test). You may be encouraged to have this screening done every 3 years, beginning at age 24.  For women ages 17-65, health care providers may recommend pelvic exams and Pap testing every 3 years, or they may recommend the Pap and pelvic exam, combined with testing for human papilloma virus (HPV), every 5 years. Some types of HPV increase your risk of cervical cancer. Testing for HPV may also be done on women of any age with unclear Pap test results.  Other health care providers may not recommend any screening for nonpregnant women who are considered low risk for pelvic cancer and who do not have symptoms. Ask your health care provider if a screening pelvic exam is right for you.  If you have had past treatment for cervical cancer or a condition that could lead to cancer, you need Pap tests and screening for cancer for at least 20 years after your treatment. If Pap tests have been discontinued, your risk factors (such as having a new sexual partner) need to be reassessed to determine if screening should resume. Some women have medical problems that increase the chance of getting cervical cancer. In these cases, your health care provider may recommend more frequent screening and Pap tests. Colorectal Cancer  This type of cancer can be detected and often prevented.  Routine colorectal cancer screening usually begins at 48 years of age and  continues through 48 years of age.  Your health care provider may recommend screening at an earlier age if you have risk factors for colon cancer.  Your health care provider may also recommend using home test kits to check for hidden blood in the stool.  A small camera at the end  of a tube can be used to examine your colon directly (sigmoidoscopy or colonoscopy). This is done to check for the earliest forms of colorectal cancer.  Routine screening usually begins at age 50.  Direct examination of the colon should be repeated every 5-10 years through 48 years of age. However, you may need to be screened more often if early forms of precancerous polyps or small growths are found. Skin Cancer  Check your skin from head to toe regularly.  Tell your health care provider about any new moles or changes in moles, especially if there is a change in a mole's shape or color.  Also tell your health care provider if you have a mole that is larger than the size of a pencil eraser.  Always use sunscreen. Apply sunscreen liberally and repeatedly throughout the day.  Protect yourself by wearing long sleeves, pants, a wide-brimmed hat, and sunglasses whenever you are outside. HEART DISEASE, DIABETES, AND HIGH BLOOD PRESSURE   High blood pressure causes heart disease and increases the risk of stroke. High blood pressure is more likely to develop in:  People who have blood pressure in the high end of the normal range (130-139/85-89 mm Hg).  People who are overweight or obese.  People who are African American.  If you are 18-39 years of age, have your blood pressure checked every 3-5 years. If you are 40 years of age or older, have your blood pressure checked every year. You should have your blood pressure measured twice--once when you are at a hospital or clinic, and once when you are not at a hospital or clinic. Record the average of the two measurements. To check your blood pressure when you are not at  a hospital or clinic, you can use:  An automated blood pressure machine at a pharmacy.  A home blood pressure monitor.  If you are between 55 years and 79 years old, ask your health care provider if you should take aspirin to prevent strokes.  Have regular diabetes screenings. This involves taking a blood sample to check your fasting blood sugar level.  If you are at a normal weight and have a low risk for diabetes, have this test once every three years after 48 years of age.  If you are overweight and have a high risk for diabetes, consider being tested at a younger age or more often. PREVENTING INFECTION  Hepatitis B  If you have a higher risk for hepatitis B, you should be screened for this virus. You are considered at high risk for hepatitis B if:  You were born in a country where hepatitis B is common. Ask your health care provider which countries are considered high risk.  Your parents were born in a high-risk country, and you have not been immunized against hepatitis B (hepatitis B vaccine).  You have HIV or AIDS.  You use needles to inject street drugs.  You live with someone who has hepatitis B.  You have had sex with someone who has hepatitis B.  You get hemodialysis treatment.  You take certain medicines for conditions, including cancer, organ transplantation, and autoimmune conditions. Hepatitis C  Blood testing is recommended for:  Everyone born from 1945 through 1965.  Anyone with known risk factors for hepatitis C. Sexually transmitted infections (STIs)  You should be screened for sexually transmitted infections (STIs) including gonorrhea and chlamydia if:  You are sexually active and are younger than 48 years of age.  You are older than 48   years of age and your health care provider tells you that you are at risk for this type of infection.  Your sexual activity has changed since you were last screened and you are at an increased risk for chlamydia or  gonorrhea. Ask your health care provider if you are at risk.  If you do not have HIV, but are at risk, it may be recommended that you take a prescription medicine daily to prevent HIV infection. This is called pre-exposure prophylaxis (PrEP). You are considered at risk if:  You are sexually active and do not regularly use condoms or know the HIV status of your partner(s).  You take drugs by injection.  You are sexually active with a partner who has HIV. Talk with your health care provider about whether you are at high risk of being infected with HIV. If you choose to begin PrEP, you should first be tested for HIV. You should then be tested every 3 months for as long as you are taking PrEP.  PREGNANCY   If you are premenopausal and you may become pregnant, ask your health care provider about preconception counseling.  If you may become pregnant, take 400 to 800 micrograms (mcg) of folic acid every day.  If you want to prevent pregnancy, talk to your health care provider about birth control (contraception). OSTEOPOROSIS AND MENOPAUSE   Osteoporosis is a disease in which the bones lose minerals and strength with aging. This can result in serious bone fractures. Your risk for osteoporosis can be identified using a bone density scan.  If you are 65 years of age or older, or if you are at risk for osteoporosis and fractures, ask your health care provider if you should be screened.  Ask your health care provider whether you should take a calcium or vitamin D supplement to lower your risk for osteoporosis.  Menopause may have certain physical symptoms and risks.  Hormone replacement therapy may reduce some of these symptoms and risks. Talk to your health care provider about whether hormone replacement therapy is right for you.  HOME CARE INSTRUCTIONS   Schedule regular health, dental, and eye exams.  Stay current with your immunizations.   Do not use any tobacco products including  cigarettes, chewing tobacco, or electronic cigarettes.  If you are pregnant, do not drink alcohol.  If you are breastfeeding, limit how much and how often you drink alcohol.  Limit alcohol intake to no more than 1 drink per day for nonpregnant women. One drink equals 12 ounces of beer, 5 ounces of wine, or 1 ounces of hard liquor.  Do not use street drugs.  Do not share needles.  Ask your health care provider for help if you need support or information about quitting drugs.  Tell your health care provider if you often feel depressed.  Tell your health care provider if you have ever been abused or do not feel safe at home.   This information is not intended to replace advice given to you by your health care provider. Make sure you discuss any questions you have with your health care provider.   Document Released: 01/18/2011 Document Revised: 07/26/2014 Document Reviewed: 06/06/2013 Elsevier Interactive Patient Education 2016 Elsevier Inc.  

## 2016-05-19 LAB — URINALYSIS W MICROSCOPIC + REFLEX CULTURE
BILIRUBIN URINE: NEGATIVE
CRYSTALS: NONE SEEN [HPF]
Casts: NONE SEEN [LPF]
Glucose, UA: NEGATIVE
Hgb urine dipstick: NEGATIVE
KETONES UR: NEGATIVE
Leukocytes, UA: NEGATIVE
Nitrite: NEGATIVE
PROTEIN: NEGATIVE
RBC / HPF: NONE SEEN RBC/HPF (ref <=2)
Specific Gravity, Urine: 1.021 (ref 1.001–1.035)
Yeast: NONE SEEN [HPF]
pH: 6.5 (ref 5.0–8.0)

## 2016-05-20 LAB — URINE CULTURE

## 2016-05-25 ENCOUNTER — Encounter: Payer: Self-pay | Admitting: Internal Medicine

## 2016-05-25 ENCOUNTER — Ambulatory Visit (INDEPENDENT_AMBULATORY_CARE_PROVIDER_SITE_OTHER): Payer: BC Managed Care – PPO | Admitting: Internal Medicine

## 2016-05-25 ENCOUNTER — Other Ambulatory Visit (INDEPENDENT_AMBULATORY_CARE_PROVIDER_SITE_OTHER): Payer: BC Managed Care – PPO

## 2016-05-25 VITALS — BP 132/72 | HR 93 | Temp 98.6°F | Resp 20 | Wt 172.0 lb

## 2016-05-25 DIAGNOSIS — R196 Halitosis: Secondary | ICD-10-CM | POA: Insufficient documentation

## 2016-05-25 DIAGNOSIS — Z0001 Encounter for general adult medical examination with abnormal findings: Secondary | ICD-10-CM

## 2016-05-25 DIAGNOSIS — I10 Essential (primary) hypertension: Secondary | ICD-10-CM | POA: Diagnosis not present

## 2016-05-25 DIAGNOSIS — J329 Chronic sinusitis, unspecified: Secondary | ICD-10-CM | POA: Diagnosis not present

## 2016-05-25 DIAGNOSIS — K219 Gastro-esophageal reflux disease without esophagitis: Secondary | ICD-10-CM

## 2016-05-25 LAB — BASIC METABOLIC PANEL
BUN: 10 mg/dL (ref 6–23)
CALCIUM: 9.6 mg/dL (ref 8.4–10.5)
CO2: 30 meq/L (ref 19–32)
CREATININE: 0.81 mg/dL (ref 0.40–1.20)
Chloride: 104 mEq/L (ref 96–112)
GFR: 97.09 mL/min (ref >60.00)
GLUCOSE: 92 mg/dL (ref 70–99)
Potassium: 3.7 mEq/L (ref 3.5–5.1)
Sodium: 139 mEq/L (ref 135–145)

## 2016-05-25 LAB — URINALYSIS, ROUTINE W REFLEX MICROSCOPIC
Bilirubin Urine: NEGATIVE
Hgb urine dipstick: NEGATIVE
Ketones, ur: NEGATIVE
Leukocytes, UA: NEGATIVE
Nitrite: NEGATIVE
SPECIFIC GRAVITY, URINE: 1.025 (ref 1.000–1.030)
TOTAL PROTEIN, URINE-UPE24: NEGATIVE
URINE GLUCOSE: NEGATIVE
UROBILINOGEN UA: 0.2 (ref 0.0–1.0)
pH: 6 (ref 5.0–8.0)

## 2016-05-25 LAB — HEPATIC FUNCTION PANEL
ALBUMIN: 4.5 g/dL (ref 3.5–5.2)
ALT: 4 U/L (ref 0–35)
AST: 11 U/L (ref 0–37)
Alkaline Phosphatase: 46 U/L (ref 39–117)
Bilirubin, Direct: 0.2 mg/dL (ref 0.0–0.3)
TOTAL PROTEIN: 7.7 g/dL (ref 6.0–8.3)
Total Bilirubin: 0.7 mg/dL (ref 0.2–1.2)

## 2016-05-25 LAB — LIPID PANEL
CHOLESTEROL: 193 mg/dL (ref 0–200)
HDL: 45.3 mg/dL (ref >39.00)
LDL Cholesterol: 135 mg/dL — ABNORMAL HIGH (ref 0–99)
NonHDL: 147.34
Total CHOL/HDL Ratio: 4
Triglycerides: 64 mg/dL (ref 0.0–149.0)
VLDL: 12.8 mg/dL (ref 0.0–40.0)

## 2016-05-25 LAB — CBC WITH DIFFERENTIAL/PLATELET
BASOS ABS: 0 10*3/uL (ref 0.0–0.1)
Basophils Relative: 0.3 % (ref 0.0–3.0)
EOS ABS: 0 10*3/uL (ref 0.0–0.7)
Eosinophils Relative: 0.2 % (ref 0.0–5.0)
HCT: 37 % (ref 36.0–46.0)
Hemoglobin: 12.3 g/dL (ref 12.0–15.0)
LYMPHS ABS: 1.6 10*3/uL (ref 0.7–4.0)
Lymphocytes Relative: 20.6 % (ref 12.0–46.0)
MCHC: 33.3 g/dL (ref 30.0–36.0)
MCV: 90.1 fl (ref 78.0–100.0)
Monocytes Absolute: 0.7 10*3/uL (ref 0.1–1.0)
Monocytes Relative: 8.7 % (ref 3.0–12.0)
NEUTROS ABS: 5.4 10*3/uL (ref 1.4–7.7)
NEUTROS PCT: 70.2 % (ref 43.0–77.0)
PLATELETS: 220 10*3/uL (ref 150.0–400.0)
RBC: 4.11 Mil/uL (ref 3.87–5.11)
RDW: 14.9 % (ref 11.5–15.5)
WBC: 7.6 10*3/uL (ref 4.0–10.5)

## 2016-05-25 LAB — TSH: TSH: 0.6 u[IU]/mL (ref 0.35–4.50)

## 2016-05-25 MED ORDER — CHLORHEXIDINE GLUCONATE 0.12 % MT SOLN: 15.0000 mL | Freq: Two times a day (BID) | OROMUCOSAL | 1 refills | Status: DC

## 2016-05-25 MED ORDER — TRIAMCINOLONE ACETONIDE 55 MCG/ACT NA AERO: 2.0000 | INHALATION_SPRAY | Freq: Every day | NASAL | 12 refills | Status: DC

## 2016-05-25 MED ORDER — PANTOPRAZOLE SODIUM 40 MG PO TBEC: 40.0000 mg | DELAYED_RELEASE_TABLET | Freq: Every day | ORAL | 3 refills | Status: DC

## 2016-05-25 NOTE — Progress Notes (Signed)
Subjective:    Patient ID: [name], female    DOB: Nov 06, 1965, 50 y.o.   MRN: [id number]  HPI  Here for wellness and f/u;  Overall doing ok;  Pt denies Chest pain, worsening SOB, DOE, wheezing, orthopnea, PND, worsening LE edema, palpitations, dizziness or syncope.  Pt denies neurological change such as new headache, facial or extremity weakness.  Pt denies polydipsia, polyuria, or low sugar symptoms. Pt states overall good compliance with treatment and medications, good tolerability, and has been trying to follow appropriate diet.  Pt denies worsening depressive symptoms, suicidal ideation or panic. No fever, night sweats, wt loss, loss of appetite, or other constitutional symptoms.  Pt states good ability with ADL's, has low fall risk, home safety reviewed and adequate, no other significant changes in hearing or vision, and only occasionally active with exercise.  Has been to dentist with dental work, but also has worsening bad breath mild for several weeks, wondering if related to queeziness in the stomach;  Change of lisinopril to valsartan has worked out with no cough and vomit after, but still with some dyspeptic type symtpoms and what sounds like possible sour brash and smell.    Has not tried any OTC prilosec or similar. Has seen ENT with recent sinus congestion and infection just after a cruise, tx with antibx and nasal spray she did not take, as she already had one .  She no longer takes the ibuprofen 600 prn gyn pain as this has improved with BCP.start x 3 wks ago.  Past Medical History:  Diagnosis Date  . Allergic rhinitis   . Allergy   . Depression   . Hyperplastic colon polyp 10/2003  . Menorrhagia   . Obesity   . RLS (restless legs syndrome)    Past Surgical History:  Procedure Laterality Date  . COLONOSCOPY    . ENDOMETRIAL ABLATION  2007   Dr. Landry Mellow at [facility]  . FOOT SURGERY    . SALIVARY GLAND SURGERY  12-11   LEFT SIDE NODULE REMOVED WAS BENIGN  . TUBAL LIGATION       reports that she has never smoked. She has never used smokeless tobacco. She reports that she drinks alcohol. She reports that she does not use drugs. family history includes Cancer in her mother; Colon polyps in her mother; Heart attack in her maternal grandfather; Hyperlipidemia in her other; Hypertension in her other. Allergies  Allergen Reactions  . Other     POLLEN AND DUST  . Sulfur Hives   Current Outpatient Prescriptions on File Prior to Visit  Medication Sig Dispense Refill  . albuterol (PROVENTIL HFA;VENTOLIN HFA) 108 (90 BASE) MCG/ACT inhaler Inhale 2 puffs into the lungs every 6 (six) hours as needed. Shortness of breath 1 Inhaler 5  . amLODipine (NORVASC) 5 MG tablet Take 1 tablet (5 mg total) by mouth daily. 90 tablet 3  . clonazePAM (KLONOPIN) 0.5 MG tablet TAKE ONE TABLET BY MOUTH TWICE DAILY AS NEEDED FOR ANXIETY 60 tablet 2  . hydrochlorothiazide (MICROZIDE) 12.5 MG capsule Take 1 capsule (12.5 mg total) by mouth daily. 90 capsule 1  . norethindrone (ORTHO MICRONOR) 0.35 MG tablet Take 1 tablet (0.35 mg total) by mouth daily. 3 Package 4  . sertraline (ZOLOFT) 100 MG tablet Take 1 tablet (100 mg total) by mouth daily. 90 tablet 3  . valsartan (DIOVAN) 160 MG tablet Take 1 tablet (160 mg total) by mouth daily. 90 tablet 3  . [DISCONTINUED] fluticasone (FLONASE) 50 MCG/ACT  nasal spray 2 sprays by Nasal route daily. 16 g 2   No current facility-administered medications on file prior to visit.    Review of Systems Constitutional: Negative for increased diaphoresis, or other activity, appetite or siginficant weight change other than noted HENT: Negative for worsening hearing loss, ear pain, facial swelling, mouth sores and neck stiffness.   Eyes: Negative for other worsening pain, redness or visual disturbance.  Respiratory: Negative for choking or stridor Cardiovascular: Negative for other chest pain and palpitations.  Gastrointestinal: Negative for worsening diarrhea,  blood in stool, or abdominal distention Genitourinary: Negative for hematuria, flank pain or change in urine volume.  Musculoskeletal: Negative for myalgias or other joint complaints.  Skin: Negative for other color change and wound or drainage.  Neurological: Negative for syncope and numbness. other than noted Hematological: Negative for adenopathy. or other swelling Psychiatric/Behavioral: Negative for hallucinations, SI, self-injury, decreased concentration or other worsening agitation.  All other system neg per pt    Objective:   Physical Exam BP 132/72   Pulse 93   Temp 98.6 F (37 C) (Oral)   Resp 20   Wt 172 lb (78 kg)   LMP 05/02/2016 (Approximate)   SpO2 98%   BMI 32.50 kg/m  VS noted,  Constitutional: Pt is oriented to person, place, and time. Appears well-developed and well-nourished, in no significant distress Head: Normocephalic and atraumatic  Eyes: Conjunctivae and EOM are normal. Pupils are equal, round, and reactive to light Right Ear: External ear normal.  Left Ear: External ear normal Nose: Nose normal.  Mouth/Throat: Oropharynx is clear and moist  Neck: Normal range of motion. Neck supple. No JVD present. No tracheal deviation present or significant neck LA or mass Cardiovascular: Normal rate, regular rhythm, normal heart sounds and intact distal pulses.   Pulmonary/Chest: Effort normal and breath sounds without rales or wheezing  Abdominal: Soft. Bowel sounds are normal. NT. No HSM  Musculoskeletal: Normal range of motion. Exhibits no edema Lymphadenopathy: Has no cervical adenopathy.  Neurological: Pt is alert and oriented to person, place, and time. Pt has normal reflexes. No cranial nerve deficit. Motor grossly intact Skin: Skin is warm and dry. No rash noted or new ulcers Psychiatric:  Has normal mood and affect. Behavior is normal.  No other new exam findings    Assessment & Plan:

## 2016-05-25 NOTE — Progress Notes (Signed)
Pre visit review using our clinic review tool, if applicable. No additional management support is needed unless otherwise documented below in the visit note. 

## 2016-05-25 NOTE — Patient Instructions (Signed)
You had the flu shot today  Please take all new medication as prescribed - the nasacort, protonix, and peridex for mouthwash  Please continue all other medications as before, and refills have been done if requested.  Please have the pharmacy call with any other refills you may need.  Please continue your efforts at being more active, low cholesterol diet, and weight control.  You are otherwise up to date with prevention measures today.  Please keep your appointments with your specialists as you may have planned  Please go to the LAB in the Basement (turn left off the elevator) for the tests to be done today  You will be contacted by phone if any changes need to be made immediately.  Otherwise, you will receive a letter about your results with an explanation, but please check with MyChart first.  Please remember to sign up for MyChart if you have not done so, as this will be important to you in the future with finding out test results, communicating by private email, and scheduling acute appointments online when needed.  Please return in 1 year for your yearly visit, or sooner if needed, with Lab testing done 3-5 days before

## 2016-05-31 NOTE — Assessment & Plan Note (Signed)

## 2016-05-31 NOTE — Assessment & Plan Note (Addendum)
Mild to mod, for peridex asd,  to f/u any worsening symptoms or concerns  In addition to the time spent performing CPE, I spent an additional 25 minutes face to face,in which greater than 50% of this time was spent in counseling and coordination of care for patient's acute illness as documented.

## 2016-05-31 NOTE — Assessment & Plan Note (Signed)
stable overall by history and exam, recent data reviewed with pt, and pt to continue medical treatment as before,  to f/u any worsening symptoms or concerns BP Readings from Last 3 Encounters:  05/25/16 132/72  05/18/16 140/80  03/11/16 130/72

## 2016-05-31 NOTE — Assessment & Plan Note (Signed)
Mild to mod, for protonix qd,  to f/u any worsening symptoms or concerns

## 2016-05-31 NOTE — Assessment & Plan Note (Signed)
Mild to mod, for nasacort asd,  to f/u any worsening symptoms or concerns 

## 2016-06-01 ENCOUNTER — Other Ambulatory Visit: Payer: Self-pay | Admitting: Internal Medicine

## 2016-06-09 ENCOUNTER — Other Ambulatory Visit: Payer: Self-pay | Admitting: Internal Medicine

## 2016-06-09 NOTE — Telephone Encounter (Signed)
faxed

## 2016-06-09 NOTE — Telephone Encounter (Signed)
Done hardcopy to Corinne  

## 2016-07-09 ENCOUNTER — Ambulatory Visit (INDEPENDENT_AMBULATORY_CARE_PROVIDER_SITE_OTHER): Payer: BC Managed Care – PPO | Admitting: Women's Health

## 2016-07-09 ENCOUNTER — Encounter: Payer: Self-pay | Admitting: Women's Health

## 2016-07-09 VITALS — BP 132/80 | Ht 61.0 in | Wt 170.0 lb

## 2016-07-09 DIAGNOSIS — N760 Acute vaginitis: Secondary | ICD-10-CM | POA: Diagnosis not present

## 2016-07-09 DIAGNOSIS — B9689 Other specified bacterial agents as the cause of diseases classified elsewhere: Secondary | ICD-10-CM

## 2016-07-09 DIAGNOSIS — R350 Frequency of micturition: Secondary | ICD-10-CM

## 2016-07-09 DIAGNOSIS — N898 Other specified noninflammatory disorders of vagina: Secondary | ICD-10-CM

## 2016-07-09 DIAGNOSIS — Z113 Encounter for screening for infections with a predominantly sexual mode of transmission: Secondary | ICD-10-CM

## 2016-07-09 LAB — URINALYSIS W MICROSCOPIC + REFLEX CULTURE
BILIRUBIN URINE: NEGATIVE
CRYSTALS: NONE SEEN [HPF]
Casts: NONE SEEN [LPF]
GLUCOSE, UA: NEGATIVE
Leukocytes, UA: NEGATIVE
Nitrite: NEGATIVE
PH: 5.5 (ref 5.0–8.0)
PROTEIN: NEGATIVE
Specific Gravity, Urine: 1.025 (ref 1.001–1.035)
WBC UA: NONE SEEN WBC/HPF (ref <=5)
Yeast: NONE SEEN [HPF]

## 2016-07-09 LAB — WET PREP FOR TRICH, YEAST, CLUE
TRICH WET PREP: NONE SEEN
YEAST WET PREP: NONE SEEN

## 2016-07-09 LAB — HEPATITIS B SURFACE ANTIGEN: Hepatitis B Surface Ag: NEGATIVE

## 2016-07-09 LAB — HEPATITIS C ANTIBODY: HCV Ab: NEGATIVE

## 2016-07-09 MED ORDER — METRONIDAZOLE 500 MG PO TABS: 500.0000 mg | ORAL_TABLET | Freq: Two times a day (BID) | ORAL | 0 refills | Status: DC

## 2016-07-09 NOTE — Progress Notes (Signed)
Presents with complaint of vaginal discharge with odor for 2 weeks.  Reports frequency, occasional lower abdominal pain, and burning with urination, not at the end of urine stream. Denies fever, back pain. New partner for 1 month. Amenorrheic on Micronor, history of BTL and ablation with dysmenorrhea..   Exam: Appears well.  External genitalia within normal limits.  Speculum exam, moderate amount of gray discharge with odor. Manual no CMT or adnexal tenderness. GC/Chlamydia culture taken.  Wet prep, clue cells many, WBC few.  UA, Blood trace, negative leukocytes, no wbc's, 0-2 RBCs, bacteria many, other clue cells present.   Bacterial Vaginosis STD Screen  Plan: Flagyl 500 mg 1 tablet 2 times daily for 7 days, alcohol precautions reviewed.  GC/Chlamydia, RPR, HIV, Hep B & C. Urine culture pending.

## 2016-07-09 NOTE — Patient Instructions (Signed)
Bacterial Vaginosis Bacterial vaginosis is a vaginal infection that occurs when the normal balance of bacteria in the vagina is disrupted. It results from an overgrowth of certain bacteria. This is the most common vaginal infection among women ages 15-44. Because bacterial vaginosis increases your risk for STIs (sexually transmitted infections), getting treated can help reduce your risk for chlamydia, gonorrhea, herpes, and HIV (human immunodeficiency virus). Treatment is also important for preventing complications in pregnant women, because this condition can cause an early (premature) delivery. What are the causes? This condition is caused by an increase in harmful bacteria that are normally present in small amounts in the vagina. However, the reason that the condition develops is not fully understood. What increases the risk? The following factors may make you more likely to develop this condition:  Having a new sexual partner or multiple sexual partners.  Having unprotected sex.  Douching.  Having an intrauterine device (IUD).  Smoking.  Drug and alcohol abuse.  Taking certain antibiotic medicines.  Being pregnant.  You cannot get bacterial vaginosis from toilet seats, bedding, swimming pools, or contact with objects around you. What are the signs or symptoms? Symptoms of this condition include:  Grey or white vaginal discharge. The discharge can also be watery or foamy.  A fish-like odor with discharge, especially after sexual intercourse or during menstruation.  Itching in and around the vagina.  Burning or pain with urination.  Some women with bacterial vaginosis have no signs or symptoms. How is this diagnosed? This condition is diagnosed based on:  Your medical history.  A physical exam of the vagina.  Testing a sample of vaginal fluid under a microscope to look for a large amount of bad bacteria or abnormal cells. Your health care provider may use a cotton swab  or a small wooden spatula to collect the sample.  How is this treated? This condition is treated with antibiotics. These may be given as a pill, a vaginal cream, or a medicine that is put into the vagina (suppository). If the condition comes back after treatment, a second round of antibiotics may be needed. Follow these instructions at home: Medicines  Take over-the-counter and prescription medicines only as told by your health care provider.  Take or use your antibiotic as told by your health care provider. Do not stop taking or using the antibiotic even if you start to feel better. General instructions  If you have a female sexual partner, tell her that you have a vaginal infection. She should see her health care provider and be treated if she has symptoms. If you have a female sexual partner, he does not need treatment.  During treatment: ? Avoid sexual activity until you finish treatment. ? Do not douche. ? Avoid alcohol as directed by your health care provider. ? Avoid breastfeeding as directed by your health care provider.  Drink enough water and fluids to keep your urine clear or pale yellow.  Keep the area around your vagina and rectum clean. ? Wash the area daily with warm water. ? Wipe yourself from front to back after using the toilet.  Keep all follow-up visits as told by your health care provider. This is important. How is this prevented?  Do not douche.  Wash the outside of your vagina with warm water only.  Use protection when having sex. This includes latex condoms and dental dams.  Limit how many sexual partners you have. To help prevent bacterial vaginosis, it is best to have sex with just   one partner (monogamous).  Make sure you and your sexual partner are tested for STIs.  Wear cotton or cotton-lined underwear.  Avoid wearing tight pants and pantyhose, especially during summer.  Limit the amount of alcohol that you drink.  Do not use any products that  contain nicotine or tobacco, such as cigarettes and e-cigarettes. If you need help quitting, ask your health care provider.  Do not use illegal drugs. Where to find more information:  Centers for Disease Control and Prevention: www.cdc.gov/std  American Sexual Health Association (ASHA): www.ashastd.org  U.S. Department of Health and Human Services, Office on Women's Health: www.womenshealth.gov/ or https://www.womenshealth.gov/a-z-topics/bacterial-vaginosis Contact a health care provider if:  Your symptoms do not improve, even after treatment.  You have more discharge or pain when urinating.  You have a fever.  You have pain in your abdomen.  You have pain during sex.  You have vaginal bleeding between periods. Summary  Bacterial vaginosis is a vaginal infection that occurs when the normal balance of bacteria in the vagina is disrupted.  Because bacterial vaginosis increases your risk for STIs (sexually transmitted infections), getting treated can help reduce your risk for chlamydia, gonorrhea, herpes, and HIV (human immunodeficiency virus). Treatment is also important for preventing complications in pregnant women, because the condition can cause an early (premature) delivery.  This condition is treated with antibiotic medicines. These may be given as a pill, a vaginal cream, or a medicine that is put into the vagina (suppository). This information is not intended to replace advice given to you by your health care provider. Make sure you discuss any questions you have with your health care provider. Document Released: 07/05/2005 Document Revised: 03/20/2016 Document Reviewed: 03/20/2016 Elsevier Interactive Patient Education  2017 [employer]  

## 2016-07-10 LAB — HIV ANTIBODY (ROUTINE TESTING W REFLEX): HIV: NONREACTIVE

## 2016-07-10 LAB — GC/CHLAMYDIA PROBE AMP
CT Probe RNA: NOT DETECTED
GC PROBE AMP APTIMA: NOT DETECTED

## 2016-07-10 LAB — RPR

## 2016-07-11 LAB — URINE CULTURE: ORGANISM ID, BACTERIA: NO GROWTH

## 2016-08-05 ENCOUNTER — Other Ambulatory Visit: Payer: Self-pay | Admitting: Women's Health

## 2016-08-05 DIAGNOSIS — B9689 Other specified bacterial agents as the cause of diseases classified elsewhere: Secondary | ICD-10-CM

## 2016-08-05 DIAGNOSIS — N760 Acute vaginitis: Principal | ICD-10-CM

## 2016-08-19 ENCOUNTER — Ambulatory Visit: Payer: BC Managed Care – PPO | Admitting: Internal Medicine

## 2016-08-27 ENCOUNTER — Encounter (HOSPITAL_COMMUNITY): Payer: Self-pay | Admitting: *Deleted

## 2016-08-27 ENCOUNTER — Inpatient Hospital Stay (HOSPITAL_COMMUNITY)
Admission: AD | Admit: 2016-08-27 | Discharge: 2016-08-27 | Disposition: A | Discharge status: Home / Self Care | Payer: BC Managed Care – PPO | Source: Ambulatory Visit | Attending: Obstetrics and Gynecology | Admitting: Obstetrics and Gynecology

## 2016-08-27 DIAGNOSIS — Z800 Family history of malignant neoplasm of digestive organs: Secondary | ICD-10-CM | POA: Insufficient documentation

## 2016-08-27 DIAGNOSIS — Z79899 Other long term (current) drug therapy: Secondary | ICD-10-CM | POA: Insufficient documentation

## 2016-08-27 DIAGNOSIS — Z8371 Family history of colonic polyps: Secondary | ICD-10-CM | POA: Diagnosis not present

## 2016-08-27 DIAGNOSIS — R102 Pelvic and perineal pain: Secondary | ICD-10-CM | POA: Insufficient documentation

## 2016-08-27 DIAGNOSIS — Z882 Allergy status to sulfonamides status: Secondary | ICD-10-CM | POA: Insufficient documentation

## 2016-08-27 DIAGNOSIS — Z6832 Body mass index (BMI) 32.0-32.9, adult: Secondary | ICD-10-CM | POA: Diagnosis not present

## 2016-08-27 DIAGNOSIS — G2581 Restless legs syndrome: Secondary | ICD-10-CM | POA: Insufficient documentation

## 2016-08-27 DIAGNOSIS — N898 Other specified noninflammatory disorders of vagina: Secondary | ICD-10-CM | POA: Diagnosis not present

## 2016-08-27 DIAGNOSIS — F329 Major depressive disorder, single episode, unspecified: Secondary | ICD-10-CM | POA: Insufficient documentation

## 2016-08-27 DIAGNOSIS — E669 Obesity, unspecified: Secondary | ICD-10-CM | POA: Insufficient documentation

## 2016-08-27 LAB — URINALYSIS, DIPSTICK ONLY
BILIRUBIN URINE: NEGATIVE
Glucose, UA: NEGATIVE mg/dL
Ketones, ur: NEGATIVE mg/dL
Leukocytes, UA: NEGATIVE
NITRITE: NEGATIVE
PH: 7 (ref 5.0–8.0)
Protein, ur: NEGATIVE mg/dL
SPECIFIC GRAVITY, URINE: 1.016 (ref 1.005–1.030)

## 2016-08-27 LAB — CBC
HCT: 37.1 % (ref 36.0–46.0)
HEMOGLOBIN: 12.5 g/dL (ref 12.0–15.0)
MCH: 30.6 pg (ref 26.0–34.0)
MCHC: 33.7 g/dL (ref 30.0–36.0)
MCV: 90.9 fL (ref 78.0–100.0)
Platelets: 226 10*3/uL (ref 150–400)
RBC: 4.08 MIL/uL (ref 3.87–5.11)
RDW: 15.1 % (ref 11.5–15.5)
WBC: 9.2 10*3/uL (ref 4.0–10.5)

## 2016-08-27 LAB — WET PREP, GENITAL
SPERM: NONE SEEN
TRICH WET PREP: NONE SEEN
YEAST WET PREP: NONE SEEN

## 2016-08-27 MED ORDER — KETOROLAC TROMETHAMINE 60 MG/2ML IM SOLN
60.0000 mg | Freq: Once | INTRAMUSCULAR | Status: AC
  Administered 2016-08-27: 60 mg via INTRAMUSCULAR
  Filled 2016-08-27: qty 2

## 2016-08-27 NOTE — MAU Provider Note (Signed)
History     CSN: [id number]  Arrival date and time: 08/27/16 [id number]   First Provider Initiated Contact with Patient 08/27/16 [phone]      Chief Complaint  Patient presents with  . Pelvic Pain   [name] is a 51 y.o. [name] who presents today with cramps and pain. She states that every couple of months she will have a very painful cycle. She has been taking tylenol and some leftover vicodin. She has not tried ibuprofen.    Pelvic Pain  The patient's primary symptoms include pelvic pain. This is a new problem. The current episode started yesterday. The problem occurs intermittently. The problem has been unchanged. Pain severity now: 4/10  The problem affects both sides. She is not pregnant. Pertinent negatives include no chills, constipation, diarrhea, dysuria, fever, frequency, nausea, urgency or vomiting. The vaginal discharge was malodorous. The vaginal bleeding is typical of menses. She has not been passing clots. Nothing aggravates the symptoms. She has tried oral narcotics and heating pads for the symptoms. The treatment provided significant relief. Contraceptive use: POP. Her menstrual history has been regular (LMP 08/24/16 ).    Past Medical History:  Diagnosis Date  . Allergic rhinitis   . Allergy   . Depression   . Hyperplastic colon polyp 10/2003  . Menorrhagia   . Obesity   . RLS (restless legs syndrome)     Past Surgical History:  Procedure Laterality Date  . COLONOSCOPY    . ENDOMETRIAL ABLATION  2007   Dr. Landry Mellow at [facility]  . FOOT SURGERY    . SALIVARY GLAND SURGERY  12-11   LEFT SIDE NODULE REMOVED WAS BENIGN  . TUBAL LIGATION      Family History  Problem Relation Age of Onset  . Cancer Mother     colon cancer at 52 yo  . Colon polyps Mother   . Hyperlipidemia Other   . Hypertension Other   . Heart attack Maternal Grandfather   . Esophageal cancer Neg Hx   . Rectal cancer Neg Hx   . Stomach cancer Neg Hx     Social History  Substance Use Topics  .  Smoking status: Never Smoker  . Smokeless tobacco: Never Used  . Alcohol use 0.0 oz/week     Comment: some    Allergies:  Allergies  Allergen Reactions  . Other     POLLEN AND DUST  . Sulfur Hives    Prescriptions Prior to Admission  Medication Sig Dispense Refill Last Dose  . albuterol (PROVENTIL HFA;VENTOLIN HFA) 108 (90 BASE) MCG/ACT inhaler Inhale 2 puffs into the lungs every 6 (six) hours as needed. Shortness of breath 1 Inhaler 5 Past Week at Unknown time  . amLODipine (NORVASC) 5 MG tablet Take 1 tablet (5 mg total) by mouth daily. 90 tablet 3 08/26/2016 at Unknown time  . chlorhexidine (PERIDEX) 0.12 % solution Use as directed 15 mLs in the mouth or throat 2 (two) times daily. 180 mL 1 Past Week at Unknown time  . clonazePAM (KLONOPIN) 0.5 MG tablet TAKE ONE TABLET BY MOUTH TWICE DAILY AS NEEDED FOR ANXIETY 60 tablet 2 08/26/2016 at Unknown time  . hydrochlorothiazide (MICROZIDE) 12.5 MG capsule Take 1 capsule (12.5 mg total) by mouth daily. 90 capsule 1 Past Week at Unknown time  . norethindrone (ORTHO MICRONOR) 0.35 MG tablet Take 1 tablet (0.35 mg total) by mouth daily. 3 Package 4 08/26/2016 at Unknown time  . pantoprazole (PROTONIX) 40 MG tablet Take 1 tablet (40  mg total) by mouth daily. 90 tablet 3 Past Week at Unknown time  . sertraline (ZOLOFT) 50 MG tablet TAKE ONE TABLET BY MOUTH ONCE DAILY 90 tablet 3 08/26/2016 at Unknown time  . valsartan (DIOVAN) 160 MG tablet Take 1 tablet (160 mg total) by mouth daily. 90 tablet 3 08/26/2016 at Unknown time  . metroNIDAZOLE (FLAGYL) 500 MG tablet Take 1 tablet (500 mg total) by mouth 2 (two) times daily. 14 tablet 0 More than a month at Unknown time  . triamcinolone (NASACORT AQ) 55 MCG/ACT AERO nasal inhaler Place 2 sprays into the nose daily. 1 Inhaler 12 More than a month at Unknown time    Review of Systems  Constitutional: Negative for chills and fever.  Gastrointestinal: Negative for constipation, diarrhea, nausea and vomiting.   Genitourinary: Positive for menstrual problem, pelvic pain and vaginal bleeding. Negative for dysuria, frequency and urgency.   Physical Exam   Blood pressure 155/91, pulse 73, temperature 98 F (36.7 C), temperature source Oral, resp. rate 20, height 5\' 2"  (1.575 m), weight 175 lb 12 oz (79.7 kg), last menstrual period 08/24/2016.  Physical Exam  Nursing note and vitals reviewed. Constitutional: She is oriented to person, place, and time. She appears well-developed and well-nourished. No distress.  HENT:  Head: Normocephalic.  Cardiovascular: Normal rate.   Respiratory: Effort normal.  GI: Soft. There is no tenderness. There is no rebound.  Genitourinary:  Genitourinary Comments:  External: no lesion Vagina: scant amount of blood  Cervix: pink, smooth, no CMT Uterus: NSSC Adnexa: NT   Neurological: She is alert and oriented to person, place, and time.  Skin: Skin is warm and dry.  Psychiatric: She has a normal mood and affect.   Results for orders placed or performed during the hospital encounter of 08/27/16 (from the past 24 hour(s))  Urinalysis, dipstick only     Status: Abnormal   Collection Time: 08/27/16  6:45 AM  Result Value Ref Range   Color, Urine YELLOW YELLOW   APPearance CLEAR CLEAR   Specific Gravity, Urine 1.016 1.005 - 1.030   pH 7.0 5.0 - 8.0   Glucose, UA NEGATIVE NEGATIVE mg/dL   Hgb urine dipstick LARGE (A) NEGATIVE   Bilirubin Urine NEGATIVE NEGATIVE   Ketones, ur NEGATIVE NEGATIVE mg/dL   Protein, ur NEGATIVE NEGATIVE mg/dL   Nitrite NEGATIVE NEGATIVE   Leukocytes, UA NEGATIVE NEGATIVE  CBC     Status: None   Collection Time: 08/27/16  7:34 AM  Result Value Ref Range   WBC 9.2 4.0 - 10.5 K/uL   RBC 4.08 3.87 - 5.11 MIL/uL   Hemoglobin 12.5 12.0 - 15.0 g/dL   HCT 37.1 36.0 - 46.0 %   MCV 90.9 78.0 - 100.0 fL   MCH 30.6 26.0 - 34.0 pg   MCHC 33.7 30.0 - 36.0 g/dL   RDW 15.1 11.5 - 15.5 %   Platelets 226 150 - 400 K/uL  Wet prep, genital      Status: Abnormal   Collection Time: 08/27/16  7:42 AM  Result Value Ref Range   Yeast Wet Prep HPF POC NONE SEEN NONE SEEN   Trich, Wet Prep NONE SEEN NONE SEEN   Clue Cells Wet Prep HPF POC PRESENT (A) NONE SEEN   WBC, Wet Prep HPF POC FEW (A) NONE SEEN   Sperm NONE SEEN      MAU Course  Procedures  MDM CBC, UA and wet prep pending Patient has been given 60mg  Toradol IM for pain  0823 patient reports that pain is better after toradol  Assessment and Plan   1. Pelvic pain in female    DC home Comfort measures reviewed  RX: none  Return to MAU as needed FU with GYN as planned  Follow-up Information    Anastasio Auerbach, MD Follow up.   Specialties:  Gynecology, Radiology Contact information: [address] [place] [address] [phone]

## 2016-08-27 NOTE — MAU Note (Addendum)
PT  SAYS SHE STARTED  HURTING  IN ABD   YESTERDAY-       LMP-   08-24-2016        USED  HEATING  PAD-     IN PANTS     .          SAYS    HAS CYST  AND  FIBROIDS-   PRIMARY CARE DR -  Phineas Real .    PT ON BCP-     SOME MTHS   HAS PAINFUL  CYCLE - PLAN IS  TO WAIT .   HAD  PAINFUL  CYCLE  IN  DEC-2017-   JUST   TOOK TYLENOL  AND  SOME  LEFT OVER  HYDROCODONE.            TOOK HYDROCODONE    1/2 TAB  AT   0200-   SOME RELIEF .   THEN AT 0400- SHE  TOOK  2 XS TYLENOL-   - NO RELIEF .    BLEEDING IS  NOT HEAVY .

## 2016-08-27 NOTE — Discharge Instructions (Signed)
Pelvic Pain, Female °Female pelvic pain can be caused by many different things and start from a variety of places. Pelvic pain refers to pain that is located in the lower half of the abdomen and between your hips. The pain may occur over a short period of time (acute) or may be reoccurring (chronic). The cause of pelvic pain may be related to disorders affecting the female reproductive organs (gynecologic), but it may also be related to the bladder, kidney stones, an intestinal complication, or muscle or skeletal problems. Getting help right away for pelvic pain is important, especially if there has been severe, sharp, or a sudden onset of unusual pain. It is also important to get help right away because some types of pelvic pain can be life threatening.  °CAUSES  °Below are only some of the causes of pelvic pain. The causes of pelvic pain can be in one of several categories.  °· Gynecologic. °¨ Pelvic inflammatory disease. °¨ Sexually transmitted infection. °¨ Ovarian cyst or a twisted ovarian ligament (ovarian torsion). °¨ Uterine lining that grows outside the uterus (endometriosis). °¨ Fibroids, cysts, or tumors. °¨ Ovulation. °· Pregnancy. °¨ Pregnancy that occurs outside the uterus (ectopic pregnancy). °¨ Miscarriage. °¨ Labor. °¨ Abruption of the placenta or ruptured uterus. °· Infection. °¨ Uterine infection (endometritis). °¨ Bladder infection. °¨ Diverticulitis. °¨ Miscarriage related to a uterine infection (septic abortion). °· Bladder. °¨ Inflammation of the bladder (cystitis). °¨ Kidney stone(s). °· Gastrointestinal. °¨ Constipation. °¨ Diverticulitis. °· Neurologic. °¨ Trauma. °¨ Feeling pelvic pain because of mental or emotional causes (psychosomatic). °· Cancers of the bowel or pelvis. °EVALUATION  °Your caregiver will want to take a careful history of your concerns. This includes recent changes in your health, a careful gynecologic history of your periods (menses), and a sexual history. Obtaining  your family history and medical history is also important. Your caregiver may suggest a pelvic exam. A pelvic exam will help identify the location and severity of the pain. It also helps in the evaluation of which organ system may be involved. In order to identify the cause of the pelvic pain and be properly treated, your caregiver may order tests. These tests may include:  °· A pregnancy test. °· Pelvic ultrasonography. °· An X-ray exam of the abdomen. °· A urinalysis or evaluation of vaginal discharge. °· Blood tests. °HOME CARE INSTRUCTIONS  °· Only take over-the-counter or prescription medicines for pain, discomfort, or fever as directed by your caregiver.   °· Rest as directed by your caregiver.   °· Eat a balanced diet.   °· Drink enough fluids to make your urine clear or pale yellow, or as directed.   °· Avoid sexual intercourse if it causes pain.   °· Apply warm or cold compresses to the lower abdomen depending on which one helps the pain.   °· Avoid stressful situations.   °· Keep a journal of your pelvic pain. Write down when it started, where the pain is located, and if there are things that seem to be associated with the pain, such as food or your menstrual cycle. °· Follow up with your caregiver as directed.   °SEEK MEDICAL CARE IF: °· Your medicine does not help your pain. °· You have abnormal vaginal discharge. °SEEK IMMEDIATE MEDICAL CARE IF:  °· You have heavy bleeding from the vagina.   °· Your pelvic pain increases.   °· You feel light-headed or faint.   °· You have chills.   °· You have pain with urination or blood in your urine.   °· You have uncontrolled   diarrhea or vomiting.   You have a fever or persistent symptoms for more than 3 days.  You have a fever and your symptoms suddenly get worse.   You are being physically or sexually abused. This information is not intended to replace advice given to you by your health care provider. Make sure you discuss any questions you have with your  health care provider. Document Released: 06/01/2004 Document Revised: 03/26/2015 Document Reviewed: 04/25/2015 Elsevier Interactive Patient Education  2017 [employer].

## 2016-08-30 LAB — GC/CHLAMYDIA PROBE AMP (~~LOC~~) NOT AT ARMC
Chlamydia: NEGATIVE
NEISSERIA GONORRHEA: NEGATIVE

## 2016-09-14 ENCOUNTER — Encounter: Payer: Self-pay | Admitting: Women's Health

## 2016-09-14 ENCOUNTER — Ambulatory Visit (INDEPENDENT_AMBULATORY_CARE_PROVIDER_SITE_OTHER): Payer: BC Managed Care – PPO | Admitting: Women's Health

## 2016-09-14 VITALS — BP 170/94

## 2016-09-14 DIAGNOSIS — N946 Dysmenorrhea, unspecified: Secondary | ICD-10-CM

## 2016-09-14 DIAGNOSIS — N898 Other specified noninflammatory disorders of vagina: Secondary | ICD-10-CM | POA: Diagnosis not present

## 2016-09-14 DIAGNOSIS — B9689 Other specified bacterial agents as the cause of diseases classified elsewhere: Secondary | ICD-10-CM | POA: Diagnosis not present

## 2016-09-14 DIAGNOSIS — N760 Acute vaginitis: Secondary | ICD-10-CM

## 2016-09-14 LAB — WET PREP FOR TRICH, YEAST, CLUE
Trich, Wet Prep: NONE SEEN
YEAST WET PREP: NONE SEEN

## 2016-09-14 MED ORDER — METRONIDAZOLE 0.75 % VA GEL: VAGINAL | 0 refills | Status: DC

## 2016-09-14 NOTE — Progress Notes (Signed)
Presents with several complaints. States has had increased discharge with an odor for the past week. Has had BV in the past. Reports under increased stress, 1 son has schizophrenia, diagnosed 3 years ago, and is in the process of having him placed in a group home due to noncompliance with medications and uncontrolled disease. Same partner with negative STD screen. Hypertension on medication, Denies urinary symptoms, abdominal pain or fever. Has been amenorrheic on Micronor/BTL but continues to have monthly cramps that have caused her to miss work due to severity. History of endometrial ablation approximately  10 years ago which greatly decreased menstrual flow but has continued with menstrual cramps. 3 cm fibroid.  Exam: Appears well, somewhat upset. Abdomen soft, obese, nontender, external genitalia within normal limits, speculum exam moderate white discharge with odor noted, wet prep positive for many clues, TNTC bacteria. Bimanual no CMT or adnexal tenderness.  Bacteria vaginosis Severe dysmenorrhea/BTL /hx of ablation  Hypertension Situational stress  Plan: Aware blood pressure was elevated today will check away from office in follow-up with primary care if continues elevated. MetroGel vaginal cream 1 applicator at bedtime 5, alcohol precautions reviewed. When symptoms subside boric acid gelcaps 600 mg per vagina twice weekly, prescription, proper use given and reviewed. Instructed to call if no relief. Encourage counseling for self while dealing with situational stress of son's mental illness. Discussed with Dr. Phineas Real, will schedule ultrasound and appointment with Dr. Phineas Real to discuss possible options.

## 2016-09-14 NOTE — Patient Instructions (Signed)
Endometrial Ablation Endometrial ablation is a procedure that destroys the thin inner layer of the lining of the uterus (endometrium). This procedure may be done:  To stop heavy periods.  To stop bleeding that is causing anemia.  To control irregular bleeding.  To treat bleeding caused by small tumors (fibroids) in the endometrium.  This procedure is often an alternative to major surgery, such as removal of the uterus and cervix (hysterectomy). As a result of this procedure:  You may not be able to have children. However, if you are premenopausal (you have not gone through menopause): ? You may still have a small chance of getting pregnant. ? You will need to use a reliable method of birth control after the procedure to prevent pregnancy.  You may stop having a menstrual period, or you may have only a small amount of bleeding during your period. Menstruation may return several years after the procedure.  Tell a health care provider about:  Any allergies you have.  All medicines you are taking, including vitamins, herbs, eye drops, creams, and over-the-counter medicines.  Any problems you or family members have had with the use of anesthetic medicines.  Any blood disorders you have.  Any surgeries you have had.  Any medical conditions you have. What are the risks? Generally, this is a safe procedure. However, problems may occur, including:  A hole (perforation) in the uterus or bowel.  Infection of the uterus, bladder, or vagina.  Bleeding.  Damage to other structures or organs.  An air bubble in the lung (air embolus).  Problems with pregnancy after the procedure.  Failure of the procedure.  Decreased ability to diagnose cancer in the endometrium.  What happens before the procedure?  You will have tests of your endometrium to make sure there are no pre-cancerous cells or cancer cells present.  You may have an ultrasound of the uterus.  You may be given  medicines to thin the endometrium.  Ask your health care provider about: ? Changing or stopping your regular medicines. This is especially important if you take diabetes medicines or blood thinners. ? Taking medicines such as aspirin and ibuprofen. These medicines can thin your blood. Do not take these medicines before your procedure if your doctor tells you not to.  Plan to have someone take you home from the hospital or clinic. What happens during the procedure?  You will lie on an exam table with your feet and legs supported as in a pelvic exam.  To lower your risk of infection: ? Your health care team will wash or sanitize their hands and put on germ-free (sterile) gloves. ? Your genital area will be washed with soap.  An IV tube will be inserted into one of your veins.  You will be given a medicine to help you relax (sedative).  A surgical instrument with a light and camera (resectoscope) will be inserted into your vagina and moved into your uterus. This allows your surgeon to see inside your uterus.  Endometrial tissue will be removed using one of the following methods: ? Radiofrequency. This method uses a radiofrequency-alternating electric current to remove the endometrium. ? Cryotherapy. This method uses extreme cold to freeze the endometrium. ? Heated-free liquid. This method uses a heated saltwater (saline) solution to remove the endometrium. ? Microwave. This method uses high-energy microwaves to heat up the endometrium and remove it. ? Thermal balloon. This method involves inserting a catheter with a balloon tip into the uterus. The balloon tip is   filled with heated fluid to remove the endometrium. The procedure may vary among health care providers and hospitals. What happens after the procedure?  Your blood pressure, heart rate, breathing rate, and blood oxygen level will be monitored until the medicines you were given have worn off.  As tissue healing occurs, you may  notice vaginal bleeding for 4-6 weeks after the procedure. You may also experience: ? Cramps. ? Thin, watery vaginal discharge that is light pink or brown in color. ? A need to urinate more frequently than usual. ? Nausea.  Do not drive for 24 hours if you were given a sedative.  Do not have sex or insert anything into your vagina until your health care provider approves. Summary  Endometrial ablation is done to treat the many causes of heavy menstrual bleeding.  The procedure may be done only after medications have been tried to control the bleeding.  Plan to have someone take you home from the hospital or clinic. This information is not intended to replace advice given to you by your health care provider. Make sure you discuss any questions you have with your health care provider. Document Released: 05/14/2004 Document Revised: 07/22/2016 Document Reviewed: 07/22/2016 Elsevier Interactive Patient Education  2017 Elsevier Inc.  

## 2016-09-22 ENCOUNTER — Other Ambulatory Visit: Payer: Self-pay | Admitting: Gynecology

## 2016-09-22 ENCOUNTER — Encounter: Payer: Self-pay | Admitting: Gynecology

## 2016-09-22 ENCOUNTER — Ambulatory Visit (INDEPENDENT_AMBULATORY_CARE_PROVIDER_SITE_OTHER): Payer: BC Managed Care – PPO

## 2016-09-22 ENCOUNTER — Other Ambulatory Visit: Payer: BC Managed Care – PPO

## 2016-09-22 ENCOUNTER — Ambulatory Visit (INDEPENDENT_AMBULATORY_CARE_PROVIDER_SITE_OTHER): Payer: BC Managed Care – PPO | Admitting: Gynecology

## 2016-09-22 ENCOUNTER — Ambulatory Visit: Payer: BC Managed Care – PPO | Admitting: Women's Health

## 2016-09-22 ENCOUNTER — Other Ambulatory Visit: Payer: Self-pay | Admitting: Women's Health

## 2016-09-22 VITALS — BP 130/84

## 2016-09-22 DIAGNOSIS — N831 Corpus luteum cyst of ovary, unspecified side: Secondary | ICD-10-CM

## 2016-09-22 DIAGNOSIS — N946 Dysmenorrhea, unspecified: Secondary | ICD-10-CM | POA: Diagnosis not present

## 2016-09-22 DIAGNOSIS — Z9889 Other specified postprocedural states: Secondary | ICD-10-CM

## 2016-09-22 DIAGNOSIS — D259 Leiomyoma of uterus, unspecified: Secondary | ICD-10-CM | POA: Diagnosis not present

## 2016-09-22 DIAGNOSIS — D251 Intramural leiomyoma of uterus: Secondary | ICD-10-CM

## 2016-09-22 DIAGNOSIS — N898 Other specified noninflammatory disorders of vagina: Secondary | ICD-10-CM | POA: Diagnosis not present

## 2016-09-22 DIAGNOSIS — R102 Pelvic and perineal pain: Secondary | ICD-10-CM

## 2016-09-22 MED ORDER — FLUCONAZOLE 150 MG PO TABS: 150.0000 mg | ORAL_TABLET | Freq: Once | ORAL | 0 refills | Status: AC

## 2016-09-22 NOTE — Patient Instructions (Signed)
Office will call you to arrange surgery. 

## 2016-09-22 NOTE — Progress Notes (Signed)
    [name] 1965-12-07 [id number]        51 y.o.  [name] presents ultrasound and discussion of her pelvic pain. For over the past year the patient has had progressively worsening dysmenorrhea and heavier menses. History of endometrial ablation which sounds like [employer] historically 2007. Did well initially but now has had progressively worsening pain with her bleeding. No pain in between her menses. Now missing work. Status post tubal sterilization in the past. Status post vaginal delivery 2.  Past medical history,surgical history, problem list, medications, allergies, family history and social history were all reviewed and documented in the EPIC chart.  Directed ROS with pertinent positives and negatives documented in the history of present illness/assessment and plan.  Exam: Caryn Bee assistant Vitals:   09/22/16 1241  BP: 130/84   General appearance:  Normal Abdomen soft nontender without masses guarding rebound Pelvic external BUS vagina with thick cottage cheese discharge. Cervix normal. Uterus grossly normal size midline mobile nontender. Adnexa without masses or tenderness.  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 Endometrial echo 5.0 mm. Right ovary with corpus luteal cyst 20 x 19 x 14 mm. Left ovary normal. Left adnexal free fluid 39 x 21 mm. Cul-de-sac negative.  Assessment/Plan:  51 y.o. [name] with worsening dysmenorrhea and bleeding status post endometrial ablation in the past. Tried Micronor for menstrual suppression but pain actually got worse. I reviewed options to include other forms of vessel suppression such as combination oral contraceptives, attempted Mirena IUD, Depo-Lupron suppression. I reviewed definitive treatment to include hysterectomy. Patient actually has thought of her options and read a lot previously. She's read about side effects of the medications to include thrombosis with estrogen-containing products,  symptoms and side effects with the Depo-Lupron. Difficulty of placement of the Mirena IUD status post ablation. Ultimately she was thinking of proceeding with hysterectomy. I recommended a LAVH approach to allow for pelvic surveillance rule out endometriosis. I also recommended bilateral salpingectomies for risk reductive surgery for "ovarian cancer". The ovarian conservation issue was also reviewed and the options of removing both ovaries or leaving both ovaries for continued hormone production discussed. At age 46 the question as to how much longer the ovaries will function from a hormonal standpoint reviewed. Risks of keeping her ovaries to include 9 as well as malignant ovarian disease in the future was discussed. At this point the patient wants to keep both ovaries accepting the risk of ovarian cancer or benign ovarian disease in the future but at any time I encouraged her to call us if she changes her mind and we can proceed with oophorectomy's. General risks of the surgery reviewed to include infection, multiple incisions, possibilities of proceeding with TAH with larger incision and longer recovery if needed, transfusion and the risks of transfusion, damage to surrounding internal tissues to include bladder bowel ureters nerves and vessels with major reparative surgeries and future reparative surgeries all reviewed. Patient will move toward scheduling her surgery and represent for a preoperative appointment.  Patient does have a thick cottage cheese discharge consistent with yeast and going to go ahead and treat her with Diflucan 150 mg 1 dose.    Anastasio Auerbach MD, 1:06 PM 09/22/2016

## 2016-09-24 ENCOUNTER — Telehealth: Payer: Self-pay

## 2016-09-24 MED ORDER — PHENAZOPYRIDINE HCL 200 MG PO TABS: ORAL_TABLET | ORAL | 0 refills | Status: DC

## 2016-09-24 NOTE — Telephone Encounter (Signed)
I called patient to schedule her surgery. We discussed insurance benefits and her estimated surgery prepymt due by one week prior to surgery.  We discussed available dates and patient would like to schedule for 10/12/16.  Instructions reviewed and pre op appt set for 10/05/16 at 11:30am w Dr. Loetta Rough.  I reviewed with her need for Pyridium one tab hs before surgery and one tab the morning of surgery with the tiniest sip of water possible to get it down. rx sent. I will mail her a financial letter as well.

## 2016-09-27 NOTE — Patient Instructions (Addendum)
Your procedure is scheduled on:  Tuesday, October 12, 2016  Enter through the [employer] [facility] at:  9:00 AM  Pick up the phone at the desk and dial [phone].  Call this number if you have problems the morning of surgery: [phone].  Remember: Do NOT eat food or drink after:  Midnight Monday, October 11, 2016  Take these medicines the morning of surgery with a SIP OF WATER:  Amlodipine, Hydrochlorothiazide, Pantoprazole,  Valsartan, Pyridium,   Bring Asthma Inhaler day of surgery  Stop ALL herbal medications and Ibuprofen at this time  Do NOT smoke the day of surgery.  Do NOT wear jewelry (body piercing), metal hair clips/bobby pins, make-up, or nail polish. Do NOT wear lotions, powders, or perfumes.  You may wear deodorant. Do NOT shave for 48 hours prior to surgery. Do NOT bring valuables to the hospital. Contacts, dentures, or bridgework may not be worn into surgery.  Leave suitcase in car.  After surgery it may be brought to your room.  For patients admitted to the hospital, checkout time is 11:00 AM the day of discharge.  Bring a copy of your healthcare power of attorney and living will documents.  **Effective Friday, Jan. 12, 2018, [place] will implement no hospital visitations from children age 27 and younger due to a steady increase in flu activity in our community and hospitals. **

## 2016-09-28 ENCOUNTER — Encounter (HOSPITAL_COMMUNITY)
Admission: RE | Admit: 2016-09-28 | Discharge: 2016-09-28 | Disposition: A | Discharge status: Home / Self Care | Payer: BC Managed Care – PPO | Source: Ambulatory Visit | Attending: Gynecology | Admitting: Gynecology

## 2016-09-28 ENCOUNTER — Encounter (HOSPITAL_COMMUNITY): Payer: Self-pay

## 2016-09-28 ENCOUNTER — Other Ambulatory Visit: Payer: Self-pay

## 2016-09-28 ENCOUNTER — Other Ambulatory Visit: Payer: Self-pay | Admitting: Gynecology

## 2016-09-28 DIAGNOSIS — Z01812 Encounter for preprocedural laboratory examination: Secondary | ICD-10-CM | POA: Diagnosis present

## 2016-09-28 DIAGNOSIS — R102 Pelvic and perineal pain: Secondary | ICD-10-CM | POA: Diagnosis not present

## 2016-09-28 HISTORY — DX: Other specified postprocedural states: R11.2

## 2016-09-28 HISTORY — DX: Other specified postprocedural states: Z98.890

## 2016-09-28 LAB — CBC
HEMATOCRIT: 37.8 % (ref 36.0–46.0)
Hemoglobin: 12.6 g/dL (ref 12.0–15.0)
MCH: 30.7 pg (ref 26.0–34.0)
MCHC: 33.3 g/dL (ref 30.0–36.0)
MCV: 92.2 fL (ref 78.0–100.0)
Platelets: 216 10*3/uL (ref 150–400)
RBC: 4.1 MIL/uL (ref 3.87–5.11)
RDW: 15.1 % (ref 11.5–15.5)
WBC: 6.9 10*3/uL (ref 4.0–10.5)

## 2016-09-28 LAB — COMPREHENSIVE METABOLIC PANEL
ALK PHOS: 46 U/L (ref 38–126)
ALT: 8 U/L — ABNORMAL LOW (ref 14–54)
ANION GAP: 9 (ref 5–15)
AST: 15 U/L (ref 15–41)
Albumin: 4.1 g/dL (ref 3.5–5.0)
BILIRUBIN TOTAL: 0.4 mg/dL (ref 0.3–1.2)
BUN: 9 mg/dL (ref 6–20)
CO2: 23 mmol/L (ref 22–32)
Calcium: 8.8 mg/dL — ABNORMAL LOW (ref 8.9–10.3)
Chloride: 105 mmol/L (ref 101–111)
Creatinine, Ser: 0.81 mg/dL (ref 0.44–1.00)
GFR calc Af Amer: >60 mL/min (ref >60)
GLUCOSE: 97 mg/dL (ref 65–99)
POTASSIUM: 3.3 mmol/L — AB (ref 3.5–5.1)
Sodium: 137 mmol/L (ref 135–145)
TOTAL PROTEIN: 7.6 g/dL (ref 6.5–8.1)

## 2016-09-28 LAB — SURGICAL PCR SCREEN
MRSA, PCR: NEGATIVE
STAPHYLOCOCCUS AUREUS: NEGATIVE

## 2016-09-28 MED ORDER — POTASSIUM CHLORIDE CRYS ER 20 MEQ PO TBCR: 20.0000 meq | EXTENDED_RELEASE_TABLET | Freq: Two times a day (BID) | ORAL | 0 refills | Status: DC

## 2016-09-29 ENCOUNTER — Other Ambulatory Visit: Payer: Self-pay | Admitting: Internal Medicine

## 2016-09-29 DIAGNOSIS — Z1231 Encounter for screening mammogram for malignant neoplasm of breast: Secondary | ICD-10-CM

## 2016-09-30 ENCOUNTER — Telehealth: Payer: Self-pay

## 2016-09-30 MED ORDER — TRAMADOL HCL 50 MG PO TABS: 50.0000 mg | ORAL_TABLET | Freq: Four times a day (QID) | ORAL | 0 refills | Status: DC | PRN

## 2016-09-30 NOTE — Telephone Encounter (Signed)
Rx called in    Patient informed.

## 2016-09-30 NOTE — Telephone Encounter (Signed)
Patient called inquiring if you could prescribe her something for pain. "I am starting to have a lot of pain again.".  Scheduled for LAVH 10/12/16.

## 2016-09-30 NOTE — Telephone Encounter (Signed)
Ultram 50 mg by mouth every 6 hours as needed for pain #30 no refill

## 2016-10-01 ENCOUNTER — Inpatient Hospital Stay (HOSPITAL_COMMUNITY)
Admission: AD | Admit: 2016-10-01 | Discharge: 2016-10-01 | Disposition: A | Discharge status: Home / Self Care | Payer: BC Managed Care – PPO | Source: Ambulatory Visit | Attending: Gynecology | Admitting: Gynecology

## 2016-10-01 DIAGNOSIS — E669 Obesity, unspecified: Secondary | ICD-10-CM | POA: Diagnosis not present

## 2016-10-01 DIAGNOSIS — R102 Pelvic and perineal pain: Secondary | ICD-10-CM | POA: Diagnosis present

## 2016-10-01 DIAGNOSIS — Z79899 Other long term (current) drug therapy: Secondary | ICD-10-CM | POA: Insufficient documentation

## 2016-10-01 DIAGNOSIS — G2581 Restless legs syndrome: Secondary | ICD-10-CM | POA: Insufficient documentation

## 2016-10-01 DIAGNOSIS — K219 Gastro-esophageal reflux disease without esophagitis: Secondary | ICD-10-CM | POA: Insufficient documentation

## 2016-10-01 DIAGNOSIS — Z882 Allergy status to sulfonamides status: Secondary | ICD-10-CM | POA: Insufficient documentation

## 2016-10-01 DIAGNOSIS — N946 Dysmenorrhea, unspecified: Secondary | ICD-10-CM | POA: Insufficient documentation

## 2016-10-01 DIAGNOSIS — Z800 Family history of malignant neoplasm of digestive organs: Secondary | ICD-10-CM | POA: Insufficient documentation

## 2016-10-01 DIAGNOSIS — I10 Essential (primary) hypertension: Secondary | ICD-10-CM | POA: Insufficient documentation

## 2016-10-01 DIAGNOSIS — F329 Major depressive disorder, single episode, unspecified: Secondary | ICD-10-CM | POA: Diagnosis not present

## 2016-10-01 DIAGNOSIS — Z7951 Long term (current) use of inhaled steroids: Secondary | ICD-10-CM | POA: Diagnosis not present

## 2016-10-01 MED ORDER — KETOROLAC TROMETHAMINE 60 MG/2ML IM SOLN
60.0000 mg | Freq: Once | INTRAMUSCULAR | Status: AC
  Administered 2016-10-01: 60 mg via INTRAMUSCULAR
  Filled 2016-10-01: qty 2

## 2016-10-01 NOTE — Discharge Instructions (Signed)
Dysmenorrhea °Menstrual cramps (dysmenorrhea) are caused by the muscles of the uterus tightening (contracting) during a menstrual period. For some women, this discomfort is merely bothersome. For others, dysmenorrhea can be severe enough to interfere with everyday activities for a few days each month. °Primary dysmenorrhea is menstrual cramps that last a couple of days when you start having menstrual periods or soon after. This often begins after a teenager starts having her period. As a woman gets older or has a baby, the cramps will usually lessen or disappear. Secondary dysmenorrhea begins later in life, lasts longer, and the pain may be stronger than primary dysmenorrhea. The pain may start before the period and last a few days after the period. °What are the causes? °Dysmenorrhea is usually caused by an underlying problem, such as: °· The tissue lining the uterus grows outside of the uterus in other areas of the body (endometriosis). °· The endometrial tissue, which normally lines the uterus, is found in or grows into the muscular walls of the uterus (adenomyosis). °· The pelvic blood vessels are engorged with blood just before the menstrual period (pelvic congestive syndrome). °· Overgrowth of cells (polyps) in the lining of the uterus or cervix. °· Falling down of the uterus (prolapse) because of loose or stretched ligaments. °· Depression. °· Bladder problems, infection, or inflammation. °· Problems with the intestine, a tumor, or irritable bowel syndrome. °· Cancer of the female organs or bladder. °· A severely tipped uterus. °· A very tight opening or closed cervix. °· Noncancerous tumors of the uterus (fibroids). °· Pelvic inflammatory disease (PID). °· Pelvic scarring (adhesions) from a previous surgery. °· Ovarian cyst. °· An intrauterine device (IUD) used for birth control. °What increases the risk? °You may be at greater risk of dysmenorrhea if: °· You are younger than age 30. °· You started puberty  early. °· You have irregular or heavy bleeding. °· You have never given birth. °· You have a family history of this problem. °· You are a smoker. °What are the signs or symptoms? °· Cramping or throbbing pain in your lower abdomen. °· Headaches. °· Lower back pain. °· Nausea or vomiting. °· Diarrhea. °· Sweating or dizziness. °· Loose stools. °How is this diagnosed? °A diagnosis is based on your history, symptoms, physical exam, diagnostic tests, or procedures. Diagnostic tests or procedures may include: °· Blood tests. °· Ultrasonography. °· An examination of the lining of the uterus (dilation and curettage, D&C). °· An examination inside your abdomen or pelvis with a scope (laparoscopy). °· X-rays. °· CT scan. °· MRI. °· An examination inside the bladder with a scope (cystoscopy). °· An examination inside the intestine or stomach with a scope (colonoscopy, gastroscopy). °How is this treated? °Treatment depends on the cause of the dysmenorrhea. Treatment may include: °· Pain medicine prescribed by your health care provider. °· Birth control pills or an IUD with progesterone hormone in it. °· Hormone replacement therapy. °· Nonsteroidal anti-inflammatory drugs (NSAIDs). These may help stop the production of prostaglandins. °· Surgery to remove adhesions, endometriosis, ovarian cyst, or fibroids. °· Removal of the uterus (hysterectomy). °· Progesterone shots to stop the menstrual period. °· Cutting the nerves on the sacrum that go to the female organs (presacral neurectomy). °· Electric current to the sacral nerves (sacral nerve stimulation). °· Antidepressant medicine. °· Psychiatric therapy, counseling, or group therapy. °· Exercise and physical therapy. °· Meditation and yoga therapy. °· Acupuncture. °Follow these instructions at home: °· Only take over-the-counter or prescription medicines as directed   by your health care provider. °· Place a heating pad or hot water bottle on your lower back or abdomen. Do not  sleep with the heating pad. °· Use aerobic exercises, walking, swimming, biking, and other exercises to help lessen the cramping. °· Massage to the lower back or abdomen may help. °· Stop smoking. °· Avoid alcohol and caffeine. °Contact a health care provider if: °· Your pain does not get better with medicine. °· You have pain with sexual intercourse. °· Your pain increases and is not controlled with medicines. °· You have abnormal vaginal bleeding with your period. °· You develop nausea or vomiting with your period that is not controlled with medicine. °Get help right away if: °You pass out. °This information is not intended to replace advice given to you by your health care provider. Make sure you discuss any questions you have with your health care provider. °Document Released: 07/05/2005 Document Revised: 12/11/2015 Document Reviewed: 12/21/2012 °Elsevier Interactive Patient Education © 2017 Elsevier Inc. ° °

## 2016-10-01 NOTE — MAU Provider Note (Signed)
History     CSN: [id number]  Arrival date and time: 10/01/16 7741   First Provider Initiated Contact with Patient 10/01/16 0410      Chief Complaint  Patient presents with  . Pelvic Pain   [name] is a 51 y.o. [name] who presents today with menstrual cramps. This has been an ongoing issue for her. She is scheduled for hysterectomy on 3/27. She was given rx for Tramadol yesterday, and she taken that. However, it is not helping her pain at this time. She states that her period started today as well, and the pain has increased.    Pelvic Pain  The patient's primary symptoms include pelvic pain and vaginal bleeding. The patient's pertinent negatives include no vaginal discharge. This is a new problem. The current episode started yesterday. The problem occurs constantly. The problem has been unchanged. Pain severity now: 10/10  The problem affects both sides. She is not pregnant. Associated symptoms include nausea. Pertinent negatives include no chills, dysuria, fever, frequency, urgency or vomiting. The vaginal bleeding is typical of menses. She has not been passing clots. She has not been passing tissue. Nothing aggravates the symptoms. She has tried heating pads (tramadol ) for the symptoms. The treatment provided no relief. Menstrual history: LMP 10/01/16    Past Medical History:  Diagnosis Date  . Allergic rhinitis   . Allergy   . Depression   . GERD (gastroesophageal reflux disease)   . Hyperplastic colon polyp 10/2003  . Hypertension   . Menorrhagia   . Obesity   . PONV (postoperative nausea and vomiting)   . RLS (restless legs syndrome)     Past Surgical History:  Procedure Laterality Date  . COLONOSCOPY    . ENDOMETRIAL ABLATION  2007   Dr. Landry Mellow at [facility]  . FOOT SURGERY    . SALIVARY GLAND SURGERY  12-11   LEFT SIDE NODULE REMOVED WAS BENIGN  . TUBAL LIGATION      Family History  Problem Relation Age of Onset  . Cancer Mother     colon cancer at 7 yo  .  Colon polyps Mother   . Hyperlipidemia Other   . Hypertension Other   . Heart attack Maternal Grandfather   . Esophageal cancer Neg Hx   . Rectal cancer Neg Hx   . Stomach cancer Neg Hx     Social History  Substance Use Topics  . Smoking status: Never Smoker  . Smokeless tobacco: Never Used  . Alcohol use 0.0 oz/week     Comment: some    Allergies:  Allergies  Allergen Reactions  . Other     POLLEN AND DUST  . Sulfur Hives    Prescriptions Prior to Admission  Medication Sig Dispense Refill Last Dose  . acetaminophen (TYLENOL) 500 MG tablet Take 1,000 mg by mouth daily as needed (cramping).   Taking  . albuterol (PROVENTIL HFA;VENTOLIN HFA) 108 (90 BASE) MCG/ACT inhaler Inhale 2 puffs into the lungs every 6 (six) hours as needed. Shortness of breath 1 Inhaler 5 Taking  . amLODipine (NORVASC) 5 MG tablet Take 1 tablet (5 mg total) by mouth daily. 90 tablet 3 Taking  . chlorhexidine (PERIDEX) 0.12 % solution Use as directed 15 mLs in the mouth or throat 2 (two) times daily. (Patient not taking: Reported on 09/22/2016) 180 mL 1 Not Taking  . clonazePAM (KLONOPIN) 0.5 MG tablet TAKE ONE TABLET BY MOUTH TWICE DAILY AS NEEDED FOR ANXIETY (Patient taking differently: TAKE ONE TABLET BY  MOUTH  DAILY  AT NIGHT AS NEEDED FOR ANXIETY) 60 tablet 2 Taking  . hydrochlorothiazide (MICROZIDE) 12.5 MG capsule Take 1 capsule (12.5 mg total) by mouth daily. (Patient taking differently: Take 12.5 mg by mouth every other day. ) 90 capsule 1 Taking  . metroNIDAZOLE (METROGEL VAGINAL) 0.75 % vaginal gel 1 applicator per vagina at HS x 5 (Patient not taking: Reported on 09/22/2016) 70 g 0 Not Taking  . norethindrone (ORTHO MICRONOR) 0.35 MG tablet Take 1 tablet (0.35 mg total) by mouth daily. (Patient not taking: Reported on 09/14/2016) 3 Package 4 Not Taking  . pantoprazole (PROTONIX) 40 MG tablet Take 1 tablet (40 mg total) by mouth daily. (Patient taking differently: Take 40 mg by mouth daily as needed. ) 90  tablet 3 Taking  . phenazopyridine (PYRIDIUM) 200 MG tablet Take one tab hs before surgery and take one tab am of surgery w a sip of water. 2 tablet 0   . potassium chloride SA (K-DUR,KLOR-CON) 20 MEQ tablet Take 1 tablet (20 mEq total) by mouth 2 (two) times daily. 30 tablet 0   . sertraline (ZOLOFT) 50 MG tablet TAKE ONE TABLET BY MOUTH ONCE DAILY 90 tablet 3 Taking  . traMADol (ULTRAM) 50 MG tablet Take 1 tablet (50 mg total) by mouth every 6 (six) hours as needed. 30 tablet 0   . triamcinolone (NASACORT AQ) 55 MCG/ACT AERO nasal inhaler Place 2 sprays into the nose daily. (Patient taking differently: Place 2 sprays into the nose daily as needed (sinus). ) 1 Inhaler 12 Taking  . valsartan (DIOVAN) 160 MG tablet Take 1 tablet (160 mg total) by mouth daily. 90 tablet 3 Taking    Review of Systems  Constitutional: Negative for chills and fever.  Gastrointestinal: Positive for nausea. Negative for vomiting.  Genitourinary: Positive for pelvic pain and vaginal bleeding. Negative for dysuria, frequency, urgency and vaginal discharge.   Physical Exam   There were no vitals taken for this visit.  Physical Exam  Nursing note and vitals reviewed. Constitutional: She is oriented to person, place, and time. She appears well-developed and well-nourished. No distress.  HENT:  Head: Normocephalic.  Cardiovascular: Normal rate.   Respiratory: Effort normal.  GI: Soft. There is no tenderness. There is no rebound.  Neurological: She is alert and oriented to person, place, and time.  Skin: Skin is warm and dry.  Psychiatric: She has a normal mood and affect.    MAU Course  Procedures  MDM Given toradol here in MAU 0505: Patient reports that her pain is better at this time.    Assessment and Plan   1. Dysmenorrhea    DC home Comfort measures reviewed  RX: none, continue meds as already prescribed  Return to MAU as needed FU with OB as planned  Follow-up Information     Anastasio Auerbach, MD Follow up.   Specialties:  Gynecology, Radiology Contact information: [place] [address] [phone]            Mathis Bud 10/01/2016, 4:12 AM

## 2016-10-04 ENCOUNTER — Encounter: Payer: Self-pay | Admitting: Gynecology

## 2016-10-05 ENCOUNTER — Ambulatory Visit (INDEPENDENT_AMBULATORY_CARE_PROVIDER_SITE_OTHER): Payer: BC Managed Care – PPO | Admitting: Gynecology

## 2016-10-05 ENCOUNTER — Encounter: Payer: Self-pay | Admitting: Gynecology

## 2016-10-05 VITALS — BP 140/90

## 2016-10-05 DIAGNOSIS — N920 Excessive and frequent menstruation with regular cycle: Secondary | ICD-10-CM | POA: Diagnosis not present

## 2016-10-05 DIAGNOSIS — N946 Dysmenorrhea, unspecified: Secondary | ICD-10-CM | POA: Diagnosis not present

## 2016-10-05 DIAGNOSIS — D259 Leiomyoma of uterus, unspecified: Secondary | ICD-10-CM

## 2016-10-05 NOTE — Progress Notes (Signed)
[name] [birth year] [id number]   Preoperative consult  Chief complaint: Progressively worsening dysmenorrhea and menorrhagia  History of present illness: 51 y.o. [name] with history of progressively worsening dysmenorrhea and menorrhagia over the past 1-2 years status post endometrial ablation in the past and tubal sterilization. History of spontaneous vaginal delivery 2. Now beginning to miss work because of this. 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 Endometrial echo 5.0 mm. Right and left ovaries normal with physiologic changes. Options for management reviewed to include hormonal manipulation, attempted Mirena IUD, Depo-Lupron suppression, surgical. Patient wants to proceed with definitive treatment to include LAVH bilateral salpingectomies as discussed below. Hemoglobin 12.6 preoperatively.   Past medical history,surgical history, medications, allergies, family history and social history were all reviewed and documented in the EPIC chart.  ROS:  Was performed and pertinent positives and negatives are included in the history of present illness.  Exam:Blanca ssistant Vitals:   10/05/16 1138  BP: 140/90   General: well developed, well nourished female, no acute distress HEENT: normal  Lungs: clear to auscultation without wheezing, rales or rhonchi  Cardiac: regular rate without rubs, murmurs or gallops  Abdomen: soft, nontender without masses, guarding, rebound, organomegaly  Pelvic: external bus vagina: normal   Cervix: grossly normal  Uterus: normal size, midline and mobile, nontender  Adnexa: without masses or tenderness    Assessment/Plan:  51 y.o. [name] with progressively worsening dysmenorrhea and menorrhagia despite prior endometrial ablation. Ultrasound without evidence of hematometria showing 2 myomas as described above. Endometrial echo thin without evidence of polyp or submucous component. Options for management reviewed and the patient wants to proceed with LAVH  bilateral salpingectomies. I reviewed the proposed surgery to include the expected intraoperative and postoperative courses as well as the recovery period. Absolute and irreversible sterility associated with hysterectomy was discussed. Sexuality following hysterectomy was also discussed and the potential for persistent dyspareunia and orgasmic dysfunction was discussed, understood and accepted. The ovarian conservation issue was reviewed with the patient to include her age of 17 and the options to retain or removing both ovaries. Risks of ovarian removal to include hypoestrogenism and symptoms with possible HRT afterwards. Risks of ovarian conservation to include benign ovarian disease such as pain or cysts requiring treatment in the future and the risk of ovarian cancer in the future was also reviewed. At this point the patient wants to keep both ovaries understanding the issues and accepting the risks but does give me permission as an intraoperative decision to remove one or both ovaries if disease or other conditions present that I feel was the best choice for her and she would accept this. We are planning on bilateral salpingectomies as a risk reductive surgery as recommended by SGO. Patient also understands that we are initiating a laparoscopic approach but at any time during the procedure I may convert to a larger incision with a total abdominal hysterectomy if complications arise or it is my best intraoperative decision and this would require a larger incision, more postoperative pain and a longer recovery period. The risks of infection, prolonged antibiotics, reoperation for abscess or hematoma formation was discussed. The risks of hemorrhage necessitating transfusion and the risks of transfusion reaction, hepatitis, HIV, mad cow disease and other unknown entities was also discussed. Incisional complications to include opening and draining of incisions and closure by secondary intention, dehiscence and  long-term issues of keloid/cosmetics and hernia formation were reviewed. The risk of inadvertent injury to internal organs including bowel, bladder, ureters, vessels, nerves  either immediately recognized or delay recognized necessitating major exploratory reparative surgeries and future reparative surgeries including bowel resection, ostomy formation, bladder repair, ureteral damage repair was discussed with her. The patient's questions were answered to her satisfaction and she is ready to proceed with surgery.    Anastasio Auerbach MD, 11:59 AM 10/05/2016

## 2016-10-05 NOTE — H&P (Signed)
[name] 20-Mar-1966 [id number]   History and Physical  Chief complaint: Progressively worsening dysmenorrhea and menorrhagia  History of present illness: 51 y.o. [name] with history of progressively worsening dysmenorrhea and menorrhagia over the past 1-2 years status post endometrial ablation in the past and tubal sterilization. History of spontaneous vaginal delivery 2. Now beginning to miss work because of this. 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 Endometrial echo 5.0 mm. Right and left ovaries normal with physiologic changes. Options for management reviewed to include hormonal manipulation, attempted Mirena IUD, Depo-Lupron suppression, surgical. Patient wants to proceed with definitive treatment to include LAVH bilateral salpingectomies as discussed below. Hemoglobin 12.6 preoperatively.   Past medical history,surgical history, medications, allergies, family history and social history were all reviewed and documented in the EPIC chart.  ROS:  Was performed and pertinent positives and negatives are included in the history of present illness.  Exam:Blanca ssistant    Vitals:   10/05/16 1138  BP: 140/90   General: well developed, well nourished female, no acute distress HEENT: normal  Lungs: clear to auscultation without wheezing, rales or rhonchi  Cardiac: regular rate without rubs, murmurs or gallops  Abdomen: soft, nontender without masses, guarding, rebound, organomegaly  Pelvic: external bus vagina: normal   Cervix: grossly normal  Uterus: normal size, midline and mobile, nontender  Adnexa: without masses or tenderness    Assessment/Plan:  51 y.o. [name] with progressively worsening dysmenorrhea and menorrhagia despite prior endometrial ablation. Ultrasound without evidence of hematometria showing 2 myomas as described above. Endometrial echo thin without evidence of polyp or submucous component. Options for management reviewed and the patient wants to proceed with  LAVH bilateral salpingectomies. I reviewed the proposed surgery to include the expected intraoperative and postoperative courses as well as the recovery period. Absolute and irreversible sterility associated with hysterectomy was discussed. Sexuality following hysterectomy was also discussed and the potential for persistent dyspareunia and orgasmic dysfunction was discussed, understood and accepted. The ovarian conservation issue was reviewed with the patient to include her age of 38 and the options to retain or removing both ovaries. Risks of ovarian removal to include hypoestrogenism and symptoms with possible HRT afterwards. Risks of ovarian conservation to include benign ovarian disease such as pain or cysts requiring treatment in the future and the risk of ovarian cancer in the future was also reviewed. At this point the patient wants to keep both ovaries understanding the issues and accepting the risks but does give me permission as an intraoperative decision to remove one or both ovaries if disease or other conditions present that I feel was the best choice for her and she would accept this. We are planning on bilateral salpingectomies as a risk reductive surgery as recommended by SGO. Patient also understands that we are initiating a laparoscopic approach but at any time during the procedure I may convert to a larger incision with a total abdominal hysterectomy if complications arise or it is my best intraoperative decision and this would require a larger incision, more postoperative pain and a longer recovery period. The risks of infection, prolonged antibiotics, reoperation for abscess or hematoma formation was discussed. The risks of hemorrhage necessitating transfusion and the risks of transfusion reaction, hepatitis, HIV, mad cow disease and other unknown entities was also discussed. Incisional complications to include opening and draining of incisions and closure by secondary intention, dehiscence and  long-term issues of keloid/cosmetics and hernia formation were reviewed. The risk of inadvertent injury to internal organs including bowel,  bladder, ureters, vessels, nerves either immediately recognized or delay recognized necessitating major exploratory reparative surgeries and future reparative surgeries including bowel resection, ostomy formation, bladder repair, ureteral damage repair was discussed with her. The patient's questions were answered to her satisfaction and she is ready to proceed with surgery.     Anastasio Auerbach MD, 12:26 PM 10/05/2016

## 2016-10-05 NOTE — Patient Instructions (Signed)
Followup for surgery as scheduled. 

## 2016-10-12 ENCOUNTER — Ambulatory Visit (HOSPITAL_COMMUNITY)
Admission: RE | Admit: 2016-10-12 | Discharge: 2016-10-13 | Disposition: A | Discharge status: Home / Self Care | Payer: BC Managed Care – PPO | Source: Ambulatory Visit | Attending: Gynecology | Admitting: Gynecology

## 2016-10-12 ENCOUNTER — Ambulatory Visit (HOSPITAL_COMMUNITY): Payer: BC Managed Care – PPO | Admitting: Anesthesiology

## 2016-10-12 ENCOUNTER — Encounter (HOSPITAL_COMMUNITY)
Admission: RE | Disposition: A | Discharge status: Home / Self Care | Payer: Self-pay | Source: Ambulatory Visit | Attending: Gynecology

## 2016-10-12 ENCOUNTER — Encounter (HOSPITAL_COMMUNITY): Payer: Self-pay | Admitting: Anesthesiology

## 2016-10-12 DIAGNOSIS — N946 Dysmenorrhea, unspecified: Secondary | ICD-10-CM | POA: Diagnosis not present

## 2016-10-12 DIAGNOSIS — K219 Gastro-esophageal reflux disease without esophagitis: Secondary | ICD-10-CM | POA: Diagnosis not present

## 2016-10-12 DIAGNOSIS — N800 Endometriosis of uterus: Secondary | ICD-10-CM | POA: Diagnosis not present

## 2016-10-12 DIAGNOSIS — E669 Obesity, unspecified: Secondary | ICD-10-CM | POA: Diagnosis not present

## 2016-10-12 DIAGNOSIS — D252 Subserosal leiomyoma of uterus: Secondary | ICD-10-CM | POA: Diagnosis not present

## 2016-10-12 DIAGNOSIS — Z6831 Body mass index (BMI) 31.0-31.9, adult: Secondary | ICD-10-CM | POA: Insufficient documentation

## 2016-10-12 DIAGNOSIS — N920 Excessive and frequent menstruation with regular cycle: Secondary | ICD-10-CM | POA: Diagnosis present

## 2016-10-12 DIAGNOSIS — N72 Inflammatory disease of cervix uteri: Secondary | ICD-10-CM | POA: Insufficient documentation

## 2016-10-12 DIAGNOSIS — N809 Endometriosis, unspecified: Secondary | ICD-10-CM | POA: Diagnosis not present

## 2016-10-12 DIAGNOSIS — I10 Essential (primary) hypertension: Secondary | ICD-10-CM | POA: Diagnosis not present

## 2016-10-12 HISTORY — PX: CYSTOSCOPY: SHX5120

## 2016-10-12 HISTORY — PX: LAPAROSCOPIC VAGINAL HYSTERECTOMY WITH SALPINGECTOMY: SHX6680

## 2016-10-12 LAB — PREGNANCY, URINE: PREG TEST UR: NEGATIVE

## 2016-10-12 SURGERY — HYSTERECTOMY, VAGINAL, LAPAROSCOPY-ASSISTED, WITH SALPINGECTOMY
Anesthesia: General | Site: Urethra

## 2016-10-12 MED ORDER — HYDROMORPHONE HCL 1 MG/ML IJ SOLN
INTRAMUSCULAR | Status: AC
  Administered 2016-10-12: 0.5 mg via INTRAVENOUS
  Filled 2016-10-12: qty 1

## 2016-10-12 MED ORDER — DEXAMETHASONE SODIUM PHOSPHATE 10 MG/ML IJ SOLN
INTRAMUSCULAR | Status: DC | PRN
  Administered 2016-10-12: 10 mg via INTRAVENOUS

## 2016-10-12 MED ORDER — PROMETHAZINE HCL 25 MG/ML IJ SOLN: 6.2500 mg | INTRAMUSCULAR | Status: DC | PRN

## 2016-10-12 MED ORDER — BUPIVACAINE HCL (PF) 0.25 % IJ SOLN
INTRAMUSCULAR | Status: DC | PRN
  Administered 2016-10-12: 6 mL

## 2016-10-12 MED ORDER — CEFOTETAN DISODIUM-DEXTROSE 2-2.08 GM-% IV SOLR
INTRAVENOUS | Status: AC
  Filled 2016-10-12: qty 50

## 2016-10-12 MED ORDER — KETOROLAC TROMETHAMINE 30 MG/ML IJ SOLN: 30.0000 mg | Freq: Four times a day (QID) | INTRAMUSCULAR | Status: DC

## 2016-10-12 MED ORDER — LIDOCAINE 2% (20 MG/ML) 5 ML SYRINGE
INTRAMUSCULAR | Status: DC | PRN
  Administered 2016-10-12: 100 mg via INTRAVENOUS

## 2016-10-12 MED ORDER — SUGAMMADEX SODIUM 200 MG/2ML IV SOLN
INTRAVENOUS | Status: AC
  Filled 2016-10-12: qty 2

## 2016-10-12 MED ORDER — POTASSIUM CHLORIDE CRYS ER 20 MEQ PO TBCR
20.0000 meq | EXTENDED_RELEASE_TABLET | Freq: Two times a day (BID) | ORAL | Status: DC
  Administered 2016-10-12 – 2016-10-13 (×3): 20 meq via ORAL
  Filled 2016-10-12 (×3): qty 1

## 2016-10-12 MED ORDER — ONDANSETRON HCL 4 MG/2ML IJ SOLN
INTRAMUSCULAR | Status: DC | PRN
  Administered 2016-10-12: 4 mg via INTRAVENOUS

## 2016-10-12 MED ORDER — CLONAZEPAM 0.5 MG PO TABS
0.5000 mg | ORAL_TABLET | Freq: Two times a day (BID) | ORAL | Status: DC | PRN
  Administered 2016-10-12: 0.5 mg via ORAL
  Filled 2016-10-12: qty 1

## 2016-10-12 MED ORDER — FENTANYL CITRATE (PF) 100 MCG/2ML IJ SOLN
INTRAMUSCULAR | Status: DC | PRN
  Administered 2016-10-12 (×2): 100 ug via INTRAVENOUS
  Administered 2016-10-12: 50 ug via INTRAVENOUS

## 2016-10-12 MED ORDER — KETOROLAC TROMETHAMINE 30 MG/ML IJ SOLN: 30.0000 mg | Freq: Once | INTRAMUSCULAR | Status: DC

## 2016-10-12 MED ORDER — OXYCODONE-ACETAMINOPHEN 5-325 MG PO TABS
1.0000 | ORAL_TABLET | ORAL | Status: DC | PRN
  Administered 2016-10-13 (×2): 1 via ORAL
  Filled 2016-10-12 (×2): qty 1

## 2016-10-12 MED ORDER — STERILE WATER FOR IRRIGATION IR SOLN
Status: DC | PRN
  Administered 2016-10-12: 1000 mL

## 2016-10-12 MED ORDER — KETOROLAC TROMETHAMINE 30 MG/ML IJ SOLN
INTRAMUSCULAR | Status: AC
  Filled 2016-10-12: qty 1

## 2016-10-12 MED ORDER — HYDROMORPHONE HCL 1 MG/ML IJ SOLN
INTRAMUSCULAR | Status: AC
  Filled 2016-10-12: qty 1

## 2016-10-12 MED ORDER — MORPHINE SULFATE (PF) 4 MG/ML IV SOLN: 1.0000 mg | INTRAVENOUS | Status: DC | PRN

## 2016-10-12 MED ORDER — DEXAMETHASONE SODIUM PHOSPHATE 10 MG/ML IJ SOLN
INTRAMUSCULAR | Status: AC
  Filled 2016-10-12: qty 1

## 2016-10-12 MED ORDER — HYDROMORPHONE HCL 1 MG/ML IJ SOLN
0.2500 mg | INTRAMUSCULAR | Status: DC | PRN
  Administered 2016-10-12: 0.5 mg via INTRAVENOUS

## 2016-10-12 MED ORDER — SCOPOLAMINE 1 MG/3DAYS TD PT72
MEDICATED_PATCH | TRANSDERMAL | Status: DC
Start: 2016-10-12 — End: 2016-10-13
  Administered 2016-10-12: 1.5 mg via TRANSDERMAL
  Filled 2016-10-12: qty 1

## 2016-10-12 MED ORDER — FLUORESCEIN SODIUM 10 % IV SOLN
INTRAVENOUS | Status: AC
  Filled 2016-10-12: qty 5

## 2016-10-12 MED ORDER — HYDROCHLOROTHIAZIDE 12.5 MG PO CAPS
12.5000 mg | ORAL_CAPSULE | ORAL | Status: DC
  Administered 2016-10-12: 12.5 mg via ORAL
  Filled 2016-10-12: qty 1

## 2016-10-12 MED ORDER — SCOPOLAMINE 1 MG/3DAYS TD PT72
1.0000 | MEDICATED_PATCH | Freq: Once | TRANSDERMAL | Status: DC
  Administered 2016-10-12: 1.5 mg via TRANSDERMAL

## 2016-10-12 MED ORDER — PROPOFOL 10 MG/ML IV BOLUS
INTRAVENOUS | Status: AC
  Filled 2016-10-12: qty 20

## 2016-10-12 MED ORDER — FENTANYL CITRATE (PF) 250 MCG/5ML IJ SOLN
INTRAMUSCULAR | Status: AC
  Filled 2016-10-12: qty 5

## 2016-10-12 MED ORDER — PHENYLEPHRINE 40 MCG/ML (10ML) SYRINGE FOR IV PUSH (FOR BLOOD PRESSURE SUPPORT)
PREFILLED_SYRINGE | INTRAVENOUS | Status: DC | PRN
  Administered 2016-10-12: 80 ug via INTRAVENOUS

## 2016-10-12 MED ORDER — CEFOTETAN DISODIUM-DEXTROSE 2-2.08 GM-% IV SOLR
2.0000 g | INTRAVENOUS | Status: AC
  Administered 2016-10-12: 2 g via INTRAVENOUS

## 2016-10-12 MED ORDER — LACTATED RINGERS IV SOLN
INTRAVENOUS | Status: DC
  Administered 2016-10-12 (×2): via INTRAVENOUS

## 2016-10-12 MED ORDER — BUPIVACAINE HCL (PF) 0.25 % IJ SOLN
INTRAMUSCULAR | Status: AC
Start: 2016-10-12 — End: 2016-10-12
  Filled 2016-10-12: qty 30

## 2016-10-12 MED ORDER — KETOROLAC TROMETHAMINE 30 MG/ML IJ SOLN
30.0000 mg | Freq: Four times a day (QID) | INTRAMUSCULAR | Status: DC
  Administered 2016-10-12 – 2016-10-13 (×2): 30 mg via INTRAVENOUS
  Filled 2016-10-12 (×2): qty 1

## 2016-10-12 MED ORDER — LIDOCAINE-EPINEPHRINE 1 %-1:100000 IJ SOLN
INTRAMUSCULAR | Status: AC
  Filled 2016-10-12: qty 1

## 2016-10-12 MED ORDER — DIPHENHYDRAMINE HCL 25 MG PO CAPS: 50.0000 mg | ORAL_CAPSULE | Freq: Four times a day (QID) | ORAL | Status: DC | PRN

## 2016-10-12 MED ORDER — KCL IN DEXTROSE-NACL 20-5-0.9 MEQ/L-%-% IV SOLN
INTRAVENOUS | Status: DC
  Filled 2016-10-12: qty 1000

## 2016-10-12 MED ORDER — HYDROMORPHONE HCL 1 MG/ML IJ SOLN
INTRAMUSCULAR | Status: DC | PRN
  Administered 2016-10-12: 1 mg via INTRAVENOUS

## 2016-10-12 MED ORDER — POTASSIUM CHLORIDE 2 MEQ/ML IV SOLN
INTRAVENOUS | Status: DC
  Administered 2016-10-12 (×2): via INTRAVENOUS
  Filled 2016-10-12 (×3): qty 1000

## 2016-10-12 MED ORDER — LACTATED RINGERS IR SOLN
Status: DC | PRN
  Administered 2016-10-12: 3000 mL

## 2016-10-12 MED ORDER — AMLODIPINE BESYLATE 5 MG PO TABS
5.0000 mg | ORAL_TABLET | Freq: Every day | ORAL | Status: DC
  Administered 2016-10-13: 5 mg via ORAL
  Filled 2016-10-12: qty 1

## 2016-10-12 MED ORDER — SERTRALINE HCL 50 MG PO TABS
50.0000 mg | ORAL_TABLET | Freq: Every day | ORAL | Status: DC
  Administered 2016-10-12 – 2016-10-13 (×2): 50 mg via ORAL
  Filled 2016-10-12 (×2): qty 1

## 2016-10-12 MED ORDER — KETOROLAC TROMETHAMINE 30 MG/ML IJ SOLN
INTRAMUSCULAR | Status: DC | PRN
  Administered 2016-10-12: 30 mg via INTRAVENOUS

## 2016-10-12 MED ORDER — ONDANSETRON HCL 4 MG/2ML IJ SOLN
INTRAMUSCULAR | Status: AC
  Filled 2016-10-12: qty 2

## 2016-10-12 MED ORDER — IRBESARTAN 150 MG PO TABS
150.0000 mg | ORAL_TABLET | Freq: Every day | ORAL | Status: DC
  Administered 2016-10-13: 150 mg via ORAL
  Filled 2016-10-12: qty 1

## 2016-10-12 MED ORDER — SUGAMMADEX SODIUM 200 MG/2ML IV SOLN
INTRAVENOUS | Status: DC | PRN
  Administered 2016-10-12: 200 mg via INTRAVENOUS

## 2016-10-12 MED ORDER — LIDOCAINE HCL (CARDIAC) 20 MG/ML IV SOLN
INTRAVENOUS | Status: AC
  Filled 2016-10-12: qty 5

## 2016-10-12 MED ORDER — PROPOFOL 10 MG/ML IV BOLUS
INTRAVENOUS | Status: DC | PRN
  Administered 2016-10-12: 200 mg via INTRAVENOUS

## 2016-10-12 MED ORDER — ROCURONIUM BROMIDE 100 MG/10ML IV SOLN
INTRAVENOUS | Status: AC
  Filled 2016-10-12: qty 1

## 2016-10-12 MED ORDER — MIDAZOLAM HCL 5 MG/5ML IJ SOLN
INTRAMUSCULAR | Status: DC | PRN
  Administered 2016-10-12: 2 mg via INTRAVENOUS

## 2016-10-12 MED ORDER — MIDAZOLAM HCL 2 MG/2ML IJ SOLN
INTRAMUSCULAR | Status: AC
  Filled 2016-10-12: qty 2

## 2016-10-12 MED ORDER — PHENYLEPHRINE 40 MCG/ML (10ML) SYRINGE FOR IV PUSH (FOR BLOOD PRESSURE SUPPORT)
PREFILLED_SYRINGE | INTRAVENOUS | Status: AC
Start: 2016-10-12 — End: 2016-10-12
  Filled 2016-10-12: qty 10

## 2016-10-12 MED ORDER — MEPERIDINE HCL 25 MG/ML IJ SOLN: 6.2500 mg | INTRAMUSCULAR | Status: DC | PRN

## 2016-10-12 MED ORDER — LIDOCAINE-EPINEPHRINE 1 %-1:100000 IJ SOLN
INTRAMUSCULAR | Status: DC | PRN
  Administered 2016-10-12: 10 mL

## 2016-10-12 MED ORDER — ROCURONIUM BROMIDE 10 MG/ML (PF) SYRINGE
PREFILLED_SYRINGE | INTRAVENOUS | Status: DC | PRN
  Administered 2016-10-12: 60 mg via INTRAVENOUS
  Administered 2016-10-12: 10 mg via INTRAVENOUS

## 2016-10-12 SURGICAL SUPPLY — 55 items
ADH SKN CLS APL DERMABOND .7 (GAUZE/BANDAGES/DRESSINGS) ×2
APL SRG 38 LTWT LNG FL B (MISCELLANEOUS)
APPLICATOR ARISTA FLEXITIP XL (MISCELLANEOUS) IMPLANT
CABLE HIGH FREQUENCY MONO STRZ (ELECTRODE) IMPLANT
CLOTH BEACON ORANGE TIMEOUT ST (SAFETY) ×4 IMPLANT
CONT PATH 16OZ SNAP LID 3702 (MISCELLANEOUS) ×4 IMPLANT
COVER BACK TABLE 60X90IN (DRAPES) ×4 IMPLANT
COVER MAYO STAND STRL (DRAPES) ×4 IMPLANT
DECANTER SPIKE VIAL GLASS SM (MISCELLANEOUS) ×4 IMPLANT
DERMABOND ADVANCED (GAUZE/BANDAGES/DRESSINGS) ×2
DERMABOND ADVANCED .7 DNX12 (GAUZE/BANDAGES/DRESSINGS) ×2 IMPLANT
DRSG OPSITE POSTOP 3X4 (GAUZE/BANDAGES/DRESSINGS) ×4 IMPLANT
DURAPREP 26ML APPLICATOR (WOUND CARE) ×4 IMPLANT
ELECT REM PT RETURN 9FT ADLT (ELECTROSURGICAL) ×4
ELECTRODE REM PT RTRN 9FT ADLT (ELECTROSURGICAL) ×2 IMPLANT
FILTER SMOKE EVAC LAPAROSHD (FILTER) ×4 IMPLANT
GLOVE BIO SURGEON STRL SZ7.5 (GLOVE) ×12 IMPLANT
GLOVE BIOGEL PI IND STRL 6.5 (GLOVE) ×2 IMPLANT
GLOVE BIOGEL PI IND STRL 7.0 (GLOVE) ×4 IMPLANT
GLOVE BIOGEL PI IND STRL 8 (GLOVE) ×4 IMPLANT
GLOVE BIOGEL PI INDICATOR 6.5 (GLOVE) ×2
GLOVE BIOGEL PI INDICATOR 7.0 (GLOVE) ×4
GLOVE BIOGEL PI INDICATOR 8 (GLOVE) ×4
GLOVE ECLIPSE 7.5 STRL STRAW (GLOVE) ×8 IMPLANT
HEMOSTAT ARISTA ABSORB 3G PWDR (MISCELLANEOUS) IMPLANT
LEGGING LITHOTOMY PAIR STRL (DRAPES) ×4 IMPLANT
NDL MAYO CATGUT SZ4 TPR NDL (NEEDLE) IMPLANT
NEEDLE MAYO CATGUT SZ4 (NEEDLE) IMPLANT
NS IRRIG 1000ML POUR BTL (IV SOLUTION) ×4 IMPLANT
PACK LAVH (CUSTOM PROCEDURE TRAY) ×4 IMPLANT
PACK ROBOTIC GOWN (GOWN DISPOSABLE) ×4 IMPLANT
PACK TRENDGUARD 450 HYBRID PRO (MISCELLANEOUS) IMPLANT
PACK TRENDGUARD 600 HYBRD PROC (MISCELLANEOUS) IMPLANT
PAD OB MATERNITY 4.3X12.25 (PERSONAL CARE ITEMS) ×4 IMPLANT
POUCH LAPAROSCOPIC INSTRUMENT (MISCELLANEOUS) ×6 IMPLANT
PROTECTOR NERVE ULNAR (MISCELLANEOUS) ×8 IMPLANT
SCISSORS LAP 5X35 DISP (ENDOMECHANICALS) IMPLANT
SET CYSTO W/LG BORE CLAMP LF (SET/KITS/TRAYS/PACK) ×4 IMPLANT
SET IRRIG TUBING LAPAROSCOPIC (IRRIGATION / IRRIGATOR) ×4 IMPLANT
SHEARS HARMONIC ACE PLUS 36CM (ENDOMECHANICALS) ×4 IMPLANT
SLEEVE XCEL OPT CAN 5 100 (ENDOMECHANICALS) ×6 IMPLANT
SUT PLAIN 4 0 FS 2 27 (SUTURE) ×4 IMPLANT
SUT VIC AB 0 CT1 18XCR BRD8 (SUTURE) ×4 IMPLANT
SUT VIC AB 0 CT1 36 (SUTURE) ×4 IMPLANT
SUT VIC AB 0 CT1 8-18 (SUTURE) ×8
SUT VICRYL 0 TIES 12 18 (SUTURE) ×4 IMPLANT
SUT VICRYL 0 UR6 27IN ABS (SUTURE) ×4 IMPLANT
SYR BULB IRRIGATION 50ML (SYRINGE) ×4 IMPLANT
TOWEL OR 17X24 6PK STRL BLUE (TOWEL DISPOSABLE) ×8 IMPLANT
TRAY FOLEY CATH SILVER 14FR (SET/KITS/TRAYS/PACK) ×4 IMPLANT
TRENDGUARD 450 HYBRID PRO PACK (MISCELLANEOUS) ×4
TRENDGUARD 600 HYBRID PROC PK (MISCELLANEOUS)
TROCAR XCEL NON-BLD 11X100MML (ENDOMECHANICALS) ×4 IMPLANT
TROCAR XCEL NON-BLD 5MMX100MML (ENDOMECHANICALS) ×4 IMPLANT
WARMER LAPAROSCOPE (MISCELLANEOUS) ×4 IMPLANT

## 2016-10-12 NOTE — Anesthesia Preprocedure Evaluation (Signed)
Anesthesia Evaluation  Patient identified by MRN, date of birth, ID band Patient awake    Reviewed: Allergy & Precautions, NPO status , Patient's Chart, lab work & pertinent test results  Airway Mallampati: I       Dental no notable dental hx.    Pulmonary neg pulmonary ROS,    Pulmonary exam normal        Cardiovascular hypertension, Pt. on medications Normal cardiovascular exam     Neuro/Psych negative neurological ROS     GI/Hepatic Neg liver ROS, GERD  Medicated and Controlled,  Endo/Other  negative endocrine ROS  Renal/GU negative Renal ROS     Musculoskeletal negative musculoskeletal ROS (+)   Abdominal (+) + obese,   Peds  Hematology negative hematology ROS (+)   Anesthesia Other Findings   Reproductive/Obstetrics                             Anesthesia Physical Anesthesia Plan  ASA: II  Anesthesia Plan: General   Post-op Pain Management:    Induction: Intravenous  Airway Management Planned: Oral ETT  Additional Equipment:   Intra-op Plan:   Post-operative Plan: Extubation in OR  Informed Consent: I have reviewed the patients History and Physical, chart, labs and discussed the procedure including the risks, benefits and alternatives for the proposed anesthesia with the patient or authorized representative who has indicated his/her understanding and acceptance.   Dental advisory given  Plan Discussed with: CRNA and Surgeon  Anesthesia Plan Comments:         Anesthesia Quick Evaluation

## 2016-10-12 NOTE — Transfer of Care (Signed)
Immediate Anesthesia Transfer of Care Note  Patient: [name]  Procedure(s) Performed: Procedure(s) with comments: LAPAROSCOPIC ASSISTED VAGINAL HYSTERECTOMY WITH SALPINGECTOMY (Bilateral) - request to follow first case at around 10:30am.  Requests 2 1/2 hours for the case. CYSTOSCOPY (N/A)  Patient Location: PACU  Anesthesia Type:General  Level of Consciousness:  sedated, patient cooperative and responds to stimulation  Airway & Oxygen Therapy:Patient Spontanous Breathing and Patient connected to face mask oxgen  Post-op Assessment:  Report given to PACU RN and Post -op Vital signs reviewed and stable  Post vital signs:  Reviewed and stable  Last Vitals:  Vitals:   10/12/16 0912  BP: (!) 149/102  Pulse: 74  Resp: 20  Temp: 11.6 C    Complications: No apparent anesthesia complications

## 2016-10-12 NOTE — Anesthesia Postprocedure Evaluation (Signed)
Anesthesia Post Note  Patient: [name]  Procedure(s) Performed: Procedure(s) (LRB): LAPAROSCOPIC ASSISTED VAGINAL HYSTERECTOMY WITH SALPINGECTOMY (Bilateral) CYSTOSCOPY (N/A)  Patient location during evaluation: PACU Anesthesia Type: General Level of consciousness: awake and sedated Pain management: pain level controlled Vital Signs Assessment: post-procedure vital signs reviewed and stable Respiratory status: spontaneous breathing Cardiovascular status: stable Postop Assessment: no signs of nausea or vomiting Anesthetic complications: no        Last Vitals:  Vitals:   10/12/16 1315 10/12/16 1330  BP: (!) 149/97 (!) 127/92  Pulse: 76 79  Resp: 13 11  Temp:      Last Pain:  Vitals:   10/12/16 0912  TempSrc: Oral  PainSc: 0-No pain   Pain Goal: Patients Stated Pain Goal: 4 (10/12/16 0912)               Esequiel Kleinfelter JR,JOHN Mateo Flow

## 2016-10-12 NOTE — H&P (Signed)
  The patient was examined.  I reviewed the proposed surgery and consent form with the patient.  The dictated history and physical is current and accurate and all questions were answered. The patient is ready to proceed with surgery and has a realistic understanding and expectation for the outcome.   Tara Auerbach MD, 9:54 AM 10/12/2016

## 2016-10-12 NOTE — Anesthesia Procedure Notes (Signed)
Procedure Name: Intubation Date/Time: 10/12/2016 10:22 AM Performed by: Talbot Grumbling Pre-anesthesia Checklist: Patient identified, Emergency Drugs available, Suction available and Patient being monitored Patient Re-evaluated:Patient Re-evaluated prior to inductionOxygen Delivery Method: Circle system utilized Preoxygenation: Pre-oxygenation with 100% oxygen Intubation Type: IV induction Ventilation: Mask ventilation without difficulty Laryngoscope Size: Miller and 2 Grade View: Grade II Tube type: Oral Tube size: 7.0 mm Number of attempts: 1 Airway Equipment and Method: Stylet Placement Confirmation: ETT inserted through vocal cords under direct vision,  positive ETCO2 and breath sounds checked- equal and bilateral Secured at: 21 cm Tube secured with: Tape Dental Injury: Teeth and Oropharynx as per pre-operative assessment

## 2016-10-12 NOTE — Op Note (Signed)
[name] 09-20-1965 [id number]   Post Operative Note   Date of surgery:  10/12/2016  Pre Op Dx:  Menorrhagia, dysmenorrhea, leiomyoma  Post Op Dx:  Menorrhagia, dysmenorrhea, leiomyoma, endometriosis  Procedure:  Laparoscopic-assisted vaginal hysterectomy, bilateral salpingectomies, cystoscopy  Surgeon:  Anastasio Auerbach  Assistant:  Uvaldo Rising  Anesthesia:  General  EBL:  100 cc anesthesia reported  Complications:  None  Specimen:  Uterus, bilateral fallopian tube segments to pathology  Findings: EUA:  External BUS vagina normal. Cervix grossly normal. Uterus normal size midline mobile. Adnexa without masses   Operative:  Anterior cul-de-sac normal. Posterior cul-de-sac normal. Uterus mildly enlarged with serosal changes suggesting subserosal leiomyoma. Right and left fallopian tube segments with evidence of prior tubal sterilization. Left ovary normal free and mobile. Right ovary with small area to suggest classic endometriotic implant otherwise normal. This area was fulgurated superficially. Right pelvic sidewall with classic powder burn endometriotic implant upper mid pelvis excised during the procedure. Upper abdominal exam shows liver smooth without abnormalities. Gallbladder not clearly visualized. No upper abdominal pathology noted.   Cystoscopy: Normal-appearing bladder mucosa with no evidence of trauma with bilateral ureteral jets noted.  Procedure:  The patient was taken to the operating room, placed in the low dorsal lithotomy position, underwent general anesthesia without difficulty, received an abdominal/perineal/vaginal preparation with antiseptic solution and a Foley catheter was placed in sterile technique. The timeout was performed by the surgical team. A Hulka tenaculum was placed on the cervix without difficulty and the patient was draped in the usual fashion. A transverse infraumbilical incision was made and using the 10 mm direct entry trocar the abdomen  was directly entered under direct visualization without difficulty and subsequently insufflated. Right and left 5 mm suprapubic ports were then placed without difficulty under direct visualization after transillumination for the vessels. Examination of the pelvic organs and upper abdominal exam was carried out with findings noted above. The right fallopian tube was elevated and the mesosalpinx was transected using the harmonic scalpel with care to avoid the infundibulopelvic vasculature. The uterine-ovarian pedicle was then transected using the harmonic scalpel and the right round ligament was subsequently transected. The lateral peritoneal reflection was transected to the lower uterine segment and the anterior vesicouterine peritoneal fold was transected to the midline all using the harmonic scalpel. A similar procedure was carried out on the other side meeting the vesicouterine peritoneal reflection in the midline. Attention was then turned to the vaginal portion of the procedure and the patient was placed in the high dorsal lithotomy position, the Hulka tenaculum removed, the cervix visualized with a weighted speculum, grasped with a tenaculum and the cervical mucosa was circumferentially injected using 1% lidocaine with 1:100,000 epinephrine mixture, 10 cc total. The cervical mucosa was then sharply incised circumferentially and the paracervical plane sharply developed. The posterior cul-de-sac was then entered without difficulty, a long weighted speculum placed and the right and left uterosacral ligaments were identified, clamped cut and ligated using 0 Vicryl suture and tagged for future reference. The anterior cul-de-sac was sharply and bluntly developed and the anterior cul-de-sac ultimately entered without difficulty and the uterus was progressively freed from its attachments through clamping, cutting and ligating of the paracervical and parametrial tissues to the level of the uterine vessels which were  identified and ligated separately bilaterally. At this point due to a relatively narrow pelvis and the bulk of the uterus it was decided to core the uterus to facilitate delivery through the vagina and using a scalpel  noting the specimen was out of the peritoneal cavity, the uterus was cored in several pieces and ultimately delivered through the vagina with the remaining attachments clamped cut and ligated using 0 Vicryl suture. A shorter weighted speculum was then placed, a tagged tail sponge placed to pack the intestines from the cul-de-sac and the posterior vaginal cuff was run from uterosacral ligament to uterosacral ligament using 0 Vicryl suture in a running interlocking stitch. Hemostasis was visualized and the packing was removed and the vagina was closed anterior to posterior using 0 Vicryl suture in interrupted figure-of-eight stitch. The vagina was irrigated showing adequate hemostasis. The Foley catheter was removed and cystoscopy was performed, the patient having previously received Pyridium preoperatively and bilateral ureteral jets were identified and no evidence of bladder mucosal damage. The Foley catheter was replaced, the patient placed in the low dorsal lithotomy position, the surgical team regloved and regowned and the abdomen was reinsufflated with inspection of the pelvis showing adequate hemostasis with the exception of several small bleeding points which were addressed with bipolar cautery. Arista hemostatic powder was placed in the cul-de-sac prophylactically, the gas allowed to escape with adequate hemostasis visualized at the various pedicles and cuff under a low pressure situation, the 5 mm ports backout under direct visualization showing adequate hemostasis and the infra umbilical port backout showing adequate hemostasis and no evidence of hernia formation. All skin incisions were injected using 0.25% Marcaine and the infraumbilical port was closed using 0 Vicryl suture in an  interrupted subcutaneous fascial stitch. The infraumbilical skin was reapproximated using 4-0 plain suture in a running subcuticular stitch and Dermabond skin adhesive was applied to all of the skin sites for closure. The patient received intraoperative Toradol. The sponge and needle count were verified correct, the patient awakened without difficulty and the patient was taken to recovery in good condition having tolerated procedure well.     Anastasio Auerbach MD, 1:00 PM 10/12/2016

## 2016-10-12 NOTE — Progress Notes (Signed)
Patient ID: [name], female   DOB: July 16, 1966, 51 y.o.   MRN: [id number] [name] 12/27/1965 [id number]   Day of Surgery s/p Procedure(s): LAPAROSCOPIC ASSISTED VAGINAL HYSTERECTOMY WITH SALPINGECTOMY CYSTOSCOPY  Subjective: Patient reports no acute distress, pain severity reported mild, Yes.   taking PO, foley catheter in place,No. ambulating, No. passing flatus  Objective: Vital signs in last 24 hours: Temp:  [97.5 F (36.4 C)-98.1 F (36.7 C)] 97.5 F (36.4 C) (03/27 1520) Pulse Rate:  [74-86] 77 (03/27 1520) Resp:  [11-20] 16 (03/27 1520) BP: (121-155)/(78-102) 121/78 (03/27 1520) SpO2:  [90 %-100 %] 99 % (03/27 1520) Weight:  [174 lb (78.9 kg)] 174 lb (78.9 kg) (03/27 0912)    EXAM General: awake, no distress Resp: clear to auscultation bilaterally Cardio: regular rate and rhythm GI: soft, minimal tenderness, bowel sounds present, incisions/dressings dry  Lower Extremities: Without swelling or tenderness Vaginal Bleeding: reported scant   Assessment: s/p Procedure(s): LAPAROSCOPIC ASSISTED VAGINAL HYSTERECTOMY WITH SALPINGECTOMY CYSTOSCOPY: stable and progressing well.  Results of surgery reviewed with patient.  Plan: Continue routine post operative care, Advance diet Encourage ambulation Advance to PO medication  LOS: 0 days    Anastasio Auerbach MD, 4:40 PM 10/12/2016

## 2016-10-13 ENCOUNTER — Encounter (HOSPITAL_COMMUNITY): Payer: Self-pay | Admitting: Gynecology

## 2016-10-13 DIAGNOSIS — N72 Inflammatory disease of cervix uteri: Secondary | ICD-10-CM | POA: Diagnosis not present

## 2016-10-13 MED ORDER — OXYCODONE-ACETAMINOPHEN 5-325 MG PO TABS: 1.0000 | ORAL_TABLET | ORAL | 0 refills | Status: DC | PRN

## 2016-10-13 NOTE — Discharge Instructions (Signed)
°  Postoperative Instructions Hysterectomy ° °Dr. Pharrah Rottman and the nursing staff have discussed postoperative instructions with you.  If you have any questions please ask them before you leave the hospital, or call Dr Brysan Mcevoy’s office at [phone].   ° °We would like to emphasize the following instructions: ° ° °  Call the office to make your follow-up appointment as recommended by Dr Kiyaan Haq (usually 2 weeks). ° °  You were given a prescription, or one was ordered for you at the pharmacy you designated.  Get that prescription filled and take the medication according to instructions. ° °  You may eat a regular diet, but slowly until you start having bowel movements. ° °  Drink plenty of water daily. ° °  Nothing in the vagina (intercourse, douching, objects of any kind) until released by Dr Oscar Forman. ° °  No driving for two weeks.  Wait to be cleared by Dr Adorian Gwynne at your first post op check.  Car rides (short) are ok after several days at home, as long as you are not having significant pain, but no traveling out of town. ° °  You may shower, but no baths.  Walking up and down stairs is ok.  No heavy lifting, prolonged standing, repeated bending or any “working out” until your first post op check. ° °  Rest frequently, listen to your body and do not push yourself and overdo it. ° °  Call if: ° °o Your pain medication does not seem strong enough. °o Worsening pain or abdominal bloating °o Persistent nausea or vomiting °o Difficulty with urination or bowel movements. °o Temperature of 101 degrees or higher. °o Bleeding heavier then staining (clots or period type flow). °o Incisions become red, tender or begin to drain. °o You have any questions or concerns. °

## 2016-10-13 NOTE — Anesthesia Postprocedure Evaluation (Addendum)
Anesthesia Post Note  Patient: Medora Roorda  Procedure(s) Performed: Procedure(s) (LRB): LAPAROSCOPIC ASSISTED VAGINAL HYSTERECTOMY WITH SALPINGECTOMY (Bilateral) CYSTOSCOPY (N/A)  Patient location during evaluation: [facility] Anesthesia Type: General Level of consciousness: awake and alert Pain management: satisfactory to patient Vital Signs Assessment: post-procedure vital signs reviewed and stable Respiratory status: spontaneous breathing and respiratory function stable Cardiovascular status: stable Postop Assessment: adequate PO intake Anesthetic complications: no        Last Vitals:  Vitals:   10/12/16 1903 10/13/16 0620  BP: (!) 141/66 131/74  Pulse: 89 66  Resp: 18 15  Temp: 36.8 C     Last Pain:  Vitals:   10/13/16 0735  TempSrc:   PainSc: 2    Pain Goal: Patients Stated Pain Goal: 3 (10/13/16 0735)               Katherina Mires

## 2016-10-13 NOTE — Addendum Note (Signed)
Addendum  created 10/13/16 0844 by Flossie Dibble, CRNA   Sign clinical note

## 2016-10-13 NOTE — Progress Notes (Signed)
Patient ID: [name], female   DOB: 12/28/1965, 51 y.o.   MRN: [id number] [name] [birth year] [id number]   1 Day Post-Op Procedure(s) (LRB): LAPAROSCOPIC ASSISTED VAGINAL HYSTERECTOMY WITH SALPINGECTOMY (Bilateral) CYSTOSCOPY (N/A)  Subjective: Patient reports feels well, no acute distress, pain severity reported mild, Yes.   taking PO, foley catheter out, Yes.   voiding, Yes.   ambulating, Yes.   passing flatus  Objective: Vital signs in last 24 hours: Temp:  [97.5 F (36.4 C)-98.3 F (36.8 C)] 98.3 F (36.8 C) (03/27 1903) Pulse Rate:  [66-89] 66 (03/28 0620) Resp:  [11-20] 15 (03/28 0620) BP: (121-155)/(66-102) 131/74 (03/28 0620) SpO2:  [90 %-100 %] 99 % (03/28 0620) Weight:  [174 lb (78.9 kg)] 174 lb (78.9 kg) (03/27 0912)      EXAM General: awake, alert and no distress Resp: clear to auscultation bilaterally Cardio: regular rate and rhythm GI: soft, minimal tender, bowel sounds active, incisions dry, intact Lower Extremities: Without swelling or tenderness Vaginal Bleeding: Reported scant   Assessment: s/p Procedure(s): LAPAROSCOPIC ASSISTED VAGINAL HYSTERECTOMY WITH SALPINGECTOMY CYSTOSCOPY: progressing well, ready for discharge.    Plan: Discharge home today.  Precautions, instructions and follow up were discussed with the patient.  Prescriptions provided per AVS.  Patient to call the office to arrange a post-operative appointmant in 2 weeks.    Anastasio Auerbach MD, 8:04 AM 10/13/2016

## 2016-10-14 NOTE — Discharge Summary (Signed)
[name] [birth year] [id number]   Discharge Summary  Date of Admission:  10/12/2016  Date of Discharge:  10/13/2016  Discharge Diagnosis:  Menorrhagia, dysmenorrhea, leiomyoma, endometriosis, adenomyosis  Procedure:  Procedure(s): LAPAROSCOPIC ASSISTED VAGINAL HYSTERECTOMY WITH SALPINGECTOMY CYSTOSCOPY  Pathology:  CERVIX: CHRONIC CERVICITIS ENDOMETRIUM: PROLIFERATIVE ENDOMETRIUM MYOMETRIUM: ADENOMYOSIS AND LEIOMYOMAS BILATERAL FALLOPIAN TUBES: HISTOLOGICAL UNREMARKABLE  Hospital Course:  Patient underwent an uncomplicated LAVH bilateral salpingectomies 10/12/2016. Her postoperative course was uncomplicated and she was discharged on postoperative day #1 ambulating without difficulty, tolerating a regular diet, passing flatus, voiding without difficulty and with good pain relief on oral medication. The patient received instructions for postoperative care and call precautions.  She received prescriptions per AVS and will be seen in the office 2 weeks following discharge.       Anastasio Auerbach MD, 7:57 AM 10/14/2016

## 2016-10-15 ENCOUNTER — Other Ambulatory Visit: Payer: Self-pay | Admitting: Gynecology

## 2016-10-20 ENCOUNTER — Telehealth: Payer: Self-pay | Admitting: *Deleted

## 2016-10-20 NOTE — Telephone Encounter (Signed)
Pt post LAVH on 10/12/16 states yesterday when tying to ambulate she felt dizzy, pt said she has been trying to get up and moving more since surgery. Pt asked if this is normal after surgery? Can she take something to help with this? Please advise

## 2016-10-21 NOTE — Telephone Encounter (Signed)
Pt informed with the below note, states she feels better today

## 2016-10-21 NOTE — Telephone Encounter (Signed)
Push fluids to stay well hydrated but keep ambulating. Could be due to a lot of things including pain medication, normal postop unsteadiness.

## 2016-10-26 ENCOUNTER — Ambulatory Visit: Payer: BC Managed Care – PPO | Admitting: Gynecology

## 2016-10-27 ENCOUNTER — Other Ambulatory Visit: Payer: Self-pay | Admitting: Gynecology

## 2016-10-27 ENCOUNTER — Ambulatory Visit (INDEPENDENT_AMBULATORY_CARE_PROVIDER_SITE_OTHER): Payer: BC Managed Care – PPO | Admitting: Gynecology

## 2016-10-27 ENCOUNTER — Encounter: Payer: Self-pay | Admitting: Gynecology

## 2016-10-27 VITALS — BP 136/82

## 2016-10-27 DIAGNOSIS — R300 Dysuria: Secondary | ICD-10-CM

## 2016-10-27 DIAGNOSIS — Z9889 Other specified postprocedural states: Secondary | ICD-10-CM

## 2016-10-27 LAB — URINALYSIS W MICROSCOPIC + REFLEX CULTURE
Bilirubin Urine: NEGATIVE
CRYSTALS: NONE SEEN [HPF]
Casts: NONE SEEN [LPF]
GLUCOSE, UA: NEGATIVE
Ketones, ur: NEGATIVE
Nitrite: NEGATIVE
PROTEIN: NEGATIVE
Specific Gravity, Urine: 1.02 (ref 1.001–1.035)
Yeast: NONE SEEN [HPF]
pH: 5.5 (ref 5.0–8.0)

## 2016-10-27 MED ORDER — CIPROFLOXACIN HCL 250 MG PO TABS: 250.0000 mg | ORAL_TABLET | Freq: Two times a day (BID) | ORAL | 0 refills | Status: DC

## 2016-10-27 NOTE — Progress Notes (Addendum)
    [name] 29-Nov-1965 [id number]        51 y.o.  [name] presents for postoperative visit status post LAVH bilateral salpingectomies 10/12/2016. Patient doing well. Notes a little bit of dysuria and vaginal odor. No significant vaginal irritation or discharge. No fever or chills. No urgency frequency low back pain.  Past medical history,surgical history, problem list, medications, allergies, family history and social history were all reviewed and documented in the EPIC chart.  Directed ROS with pertinent positives and negatives documented in the history of present illness/assessment and plan.  Exam: Caryn Bee assistant Vitals:   10/27/16 1027  BP: 136/82   General appearance:  Normal Abdomen soft nontender. Incisions healed nicely. No masses or guarding. Pelvic external BUS vagina with cuff intact healing nicely. Scant discharge noted. Bimanual without masses or tenderness.  Assessment/Plan:  51 y.o. [name] with a normal postoperative visit. She is having some dysuria and her urinalysis suggests a UTI which would not be surprising given her surgery/catheterization. Will cover with ciprofloxacin 250 mg twice a day 7 days. Follow up if symptoms persist, worsen or recur. I reviewed the pathology from the surgery showing leiomyoma and endometriosis. Patient will slowly resume activities with the exception of continued pelvic rest and follow up in 2 weeks for her next postoperative visit.    Anastasio Auerbach MD, 10:44 AM 10/27/2016

## 2016-10-27 NOTE — Addendum Note (Signed)
Addended by: Nelva Nay on: 10/27/2016 12:09 PM   Modules accepted: Orders

## 2016-10-27 NOTE — Patient Instructions (Addendum)
Take the antibiotic twice daily for 7 days  Follow up in 2 weeks for your next postoperative visit.

## 2016-10-28 LAB — URINE CULTURE

## 2016-11-09 ENCOUNTER — Encounter: Payer: Self-pay | Admitting: Gynecology

## 2016-11-09 ENCOUNTER — Ambulatory Visit (INDEPENDENT_AMBULATORY_CARE_PROVIDER_SITE_OTHER): Payer: BC Managed Care – PPO | Admitting: Gynecology

## 2016-11-09 VITALS — BP 120/78

## 2016-11-09 DIAGNOSIS — Z09 Encounter for follow-up examination after completed treatment for conditions other than malignant neoplasm: Secondary | ICD-10-CM

## 2016-11-09 NOTE — Progress Notes (Signed)
    [name] 11-23-1965 [id number]        51 y.o.  [name] presents for her 4 week postoperative visit status post LAVH bilateral salpingectomies. Doing well without complaints.  Past medical history,surgical history, problem list, medications, allergies, family history and social history were all reviewed and documented in the EPIC chart.  Directed ROS with pertinent positives and negatives documented in the history of present illness/assessment and plan.  Exam: [name] assistant Vitals:   11/09/16 1520  BP: 120/78   General appearance:  Normal Abdomen soft nontender. Incisions healed nicely. Pelvic external BUS vagina with vaginal cuff suture line intact. Bimanual without masses or tenderness  Assessment/Plan:  51 y.o. [name] was normal 4 week postoperative visit. We'll continue to slowly resume normal activities with the exception of continued pelvic rest. Follow up in 2 weeks for last postoperative to visit    Tara Auerbach MD, 3:30 PM 11/09/2016

## 2016-11-09 NOTE — Patient Instructions (Addendum)
Follow-up in 2 weeks for your next postoperative visit. 

## 2016-11-23 ENCOUNTER — Ambulatory Visit (INDEPENDENT_AMBULATORY_CARE_PROVIDER_SITE_OTHER): Payer: BC Managed Care – PPO | Admitting: Gynecology

## 2016-11-23 ENCOUNTER — Encounter: Payer: Self-pay | Admitting: Gynecology

## 2016-11-23 VITALS — BP 130/80

## 2016-11-23 DIAGNOSIS — Z9889 Other specified postprocedural states: Secondary | ICD-10-CM

## 2016-11-23 NOTE — Patient Instructions (Addendum)
Slowly resume all normal activities. Abstain from intercourse for another 2 weeks. Follow up in November 2018 for your annual exam when due

## 2016-11-23 NOTE — Progress Notes (Signed)
    Merita Hawks 12/27/1965 [id number]        51 y.o.  [name] presents for her postoperative visit status post LAVH, bilateral salpingectomies. Doing well without complaints. Plans to return to work tomorrow.  Past medical history,surgical history, problem list, medications, allergies, family history and social history were all reviewed and documented in the EPIC chart.  Directed ROS with pertinent positives and negatives documented in the history of present illness/assessment and plan.  Exam: Caryn Bee assistant Vitals:   11/23/16 1006  BP: 130/80   General appearance:  Normal Abdomen soft nontender with incision sites well healed Pelvic external BUS vagina with cuff healing nicely. Bimanual without masses or tenderness  Assessment/Plan:  51 y.o. [name] with normal postoperative visit. Patient will slowly resume all normal activities with the exception of pelvic rest for another 2 weeks. She is due for her annual in November and will follow up at that time. Sooner if any issues.    Anastasio Auerbach MD, 10:16 AM 11/23/2016

## 2016-11-24 ENCOUNTER — Ambulatory Visit: Payer: BC Managed Care – PPO

## 2016-12-01 ENCOUNTER — Encounter: Payer: Self-pay | Admitting: Gynecology

## 2016-12-03 ENCOUNTER — Ambulatory Visit: Payer: BC Managed Care – PPO | Admitting: Gynecology

## 2016-12-16 ENCOUNTER — Other Ambulatory Visit: Payer: Self-pay | Admitting: Internal Medicine

## 2016-12-17 ENCOUNTER — Ambulatory Visit: Payer: BC Managed Care – PPO

## 2016-12-17 ENCOUNTER — Ambulatory Visit
Admission: RE | Admit: 2016-12-17 | Discharge: 2016-12-17 | Disposition: A | Discharge status: Home / Self Care | Payer: BC Managed Care – PPO | Source: Ambulatory Visit | Attending: Internal Medicine | Admitting: Internal Medicine

## 2016-12-17 DIAGNOSIS — Z1231 Encounter for screening mammogram for malignant neoplasm of breast: Secondary | ICD-10-CM

## 2016-12-24 NOTE — Addendum Note (Signed)
Addendum  created 12/24/16 1126 by Lyn Hollingshead, MD   Sign clinical note

## 2016-12-29 ENCOUNTER — Ambulatory Visit: Payer: BC Managed Care – PPO | Admitting: Internal Medicine

## 2017-01-03 ENCOUNTER — Ambulatory Visit: Payer: BC Managed Care – PPO | Admitting: Family

## 2017-01-07 ENCOUNTER — Encounter: Payer: Self-pay | Admitting: Physician Assistant

## 2017-01-07 ENCOUNTER — Ambulatory Visit (INDEPENDENT_AMBULATORY_CARE_PROVIDER_SITE_OTHER): Payer: BC Managed Care – PPO | Admitting: Internal Medicine

## 2017-01-07 ENCOUNTER — Ambulatory Visit (INDEPENDENT_AMBULATORY_CARE_PROVIDER_SITE_OTHER): Payer: Self-pay | Admitting: Physician Assistant

## 2017-01-07 ENCOUNTER — Other Ambulatory Visit: Payer: Self-pay | Admitting: Internal Medicine

## 2017-01-07 ENCOUNTER — Encounter: Payer: Self-pay | Admitting: Internal Medicine

## 2017-01-07 VITALS — BP 144/98 | HR 70 | Ht 61.0 in | Wt 173.0 lb

## 2017-01-07 VITALS — BP 136/78 | HR 88 | Temp 98.2°F | Resp 16 | Ht 61.0 in | Wt 172.2 lb

## 2017-01-07 DIAGNOSIS — J309 Allergic rhinitis, unspecified: Secondary | ICD-10-CM | POA: Diagnosis not present

## 2017-01-07 DIAGNOSIS — J069 Acute upper respiratory infection, unspecified: Secondary | ICD-10-CM

## 2017-01-07 DIAGNOSIS — I10 Essential (primary) hypertension: Secondary | ICD-10-CM

## 2017-01-07 DIAGNOSIS — Z024 Encounter for examination for driving license: Secondary | ICD-10-CM

## 2017-01-07 MED ORDER — LEVOFLOXACIN 500 MG PO TABS: 500.0000 mg | ORAL_TABLET | Freq: Every day | ORAL | 0 refills | Status: DC

## 2017-01-07 MED ORDER — FLUCONAZOLE 150 MG PO TABS: ORAL_TABLET | ORAL | 1 refills | Status: DC

## 2017-01-07 MED ORDER — FEXOFENADINE-PSEUDOEPHED ER 180-240 MG PO TB24: 1.0000 | ORAL_TABLET | Freq: Every day | ORAL | 1 refills | Status: DC

## 2017-01-07 NOTE — Telephone Encounter (Signed)
Dr. Jenny Reichmann please advise. I do not see this on the pt's med list but in red it says "error" signed off by Ezekiel Ina.

## 2017-01-07 NOTE — Patient Instructions (Addendum)
Please take all new medication as prescribed - the antibiotic, and the Allegra D for allergies as well (and diflucan if needed)  You can also take Mucinex (or it's generic off brand) for congestion, and tylenol as needed for pain.  Please continue all other medications as before, and refills have been done if requested.  Please have the pharmacy call with any other refills you may need.  Please keep your appointments with your specialists as you may have planned

## 2017-01-07 NOTE — Progress Notes (Signed)
Subjective:    Patient ID: [name], female    DOB: 04/02/1966, 51 y.o.   MRN: [id number]  HPI  Here with 2-3 days acute onset fever, facial pain, pressure, headache, general weakness and malaise, and greenish d/c, with mild ST and cough, but pt denies chest pain, wheezing, increased sob or doe, orthopnea, PND, increased LE swelling, palpitations, dizziness or syncope. Does have several wks ongoing nasal allergy symptoms with clearish congestion, itch and sneezing, without fever, pain, ST, cough, swelling or wheezing.  Always has diflucan when has antibx Past Medical History:  Diagnosis Date  . Allergic rhinitis   . Allergy   . Depression   . GERD (gastroesophageal reflux disease)   . Hyperplastic colon polyp 10/2003  . Hypertension   . Menorrhagia   . Obesity   . PONV (postoperative nausea and vomiting)   . RLS (restless legs syndrome)    Past Surgical History:  Procedure Laterality Date  . COLONOSCOPY    . CYSTOSCOPY N/A 10/12/2016   Procedure: CYSTOSCOPY;  Surgeon: Anastasio Auerbach, MD;  Location: [facility];  Service: Gynecology;  Laterality: N/A;  . ENDOMETRIAL ABLATION  2007   [facility] Landry [facility]  . FOOT SURGERY    . LAPAROSCOPIC VAGINAL HYSTERECTOMY WITH SALPINGECTOMY Bilateral 10/12/2016   Procedure: LAPAROSCOPIC ASSISTED VAGINAL HYSTERECTOMY WITH SALPINGECTOMY;  Surgeon: Anastasio Auerbach, MD;  Location: [facility];  Service: Gynecology;  Laterality: Bilateral;  request to follow first case at around 10:30am.  Requests 2 1/2 hours for the case.  Marland Kitchen SALIVARY GLAND SURGERY  12-11   LEFT SIDE NODULE REMOVED WAS BENIGN  . TUBAL LIGATION      reports that she has never smoked. She has never used smokeless tobacco. She reports that she drinks alcohol. She reports that she does not use drugs. family history includes Cancer in her mother; Colon polyps in her mother; Heart attack in her maternal grandfather; Hyperlipidemia in her other; Hypertension in her other. Allergies    Allergen Reactions  . Other     POLLEN AND DUST  . Sulfur Hives   Current Outpatient Prescriptions on File Prior to Visit  Medication Sig Dispense Refill  . acetaminophen (TYLENOL) 500 MG tablet Take 1,000 mg by mouth daily as needed (cramping).    Marland Kitchen albuterol (PROVENTIL HFA;VENTOLIN HFA) 108 (90 BASE) MCG/ACT inhaler Inhale 2 puffs into the lungs every 6 (six) hours as needed. Shortness of breath 1 Inhaler 5  . amLODipine (NORVASC) 5 MG tablet TAKE ONE TABLET BY MOUTH ONCE DAILY 90 tablet 1  . clonazePAM (KLONOPIN) 0.5 MG tablet TAKE ONE TABLET BY MOUTH TWICE DAILY AS NEEDED FOR ANXIETY (Patient taking differently: TAKE ONE TABLET BY MOUTH  DAILY  AT NIGHT AS NEEDED FOR ANXIETY) 60 tablet 2  . hydrochlorothiazide (MICROZIDE) 12.5 MG capsule Take 1 capsule (12.5 mg total) by mouth daily. (Patient taking differently: Take 12.5 mg by mouth every other day. ) 90 capsule 1  . pantoprazole (PROTONIX) 40 MG tablet Take 1 tablet (40 mg total) by mouth daily. (Patient taking differently: Take 40 mg by mouth daily as needed. ) 90 tablet 3  . sertraline (ZOLOFT) 50 MG tablet TAKE ONE TABLET BY MOUTH ONCE DAILY 90 tablet 3  . triamcinolone (NASACORT AQ) 55 MCG/ACT AERO nasal inhaler Place 2 sprays into the nose daily. (Patient taking differently: Place 2 sprays into the nose daily as needed (sinus). ) 1 Inhaler 12  . valsartan (DIOVAN) 160 MG tablet Take 1 tablet (160  mg total) by mouth daily. 90 tablet 3  . [DISCONTINUED] fluticasone (FLONASE) 50 MCG/ACT nasal spray 2 sprays by Nasal route daily. 16 g 2   No current facility-administered medications on file prior to visit.    Review of Systems  Constitutional: Negative for other unusual diaphoresis or sweats HENT: Negative for ear discharge or swelling Eyes: Negative for other worsening visual disturbances Respiratory: Negative for stridor or other swelling  Gastrointestinal: Negative for worsening distension or other blood Genitourinary: Negative  for retention or other urinary change Musculoskeletal: Negative for other MSK pain or swelling Skin: Negative for color change or other new lesions Neurological: Negative for worsening tremors and other numbness  Psychiatric/Behavioral: Negative for worsening agitation or other fatigue All other system neg per pt    Objective:   Physical Exam        Assessment & Plan:

## 2017-01-07 NOTE — Progress Notes (Signed)
This patient presents for DOT examination for fitness for duty.  Patient Active Problem List   Diagnosis Date Noted  . Sinusitis, chronic 05/25/2016  . Bad breath 05/25/2016  . Fatigue 09/22/2015  . Abnormal TSH 05/09/2015  . Other and unspecified ovarian cyst 05/30/2013  . Chronic constipation 04/17/2013  . BV (bacterial vaginosis) 10/25/2012  . Encounter for well adult exam with abnormal findings 10/12/2010  . HYPERSOMNIA 07/16/2008  . Hyperlipidemia 03/26/2007  . Anxiety state 03/26/2007  . Depression 03/26/2007  . Essential hypertension 03/26/2007  . Allergic rhinitis 03/26/2007  . GERD 03/26/2007  . IBS 03/26/2007  . COLONIC POLYPS, HX OF 03/26/2007     Medical History:  no  1. Head/Brain Injuries, disorders or illnesses no  2. Seizures, epilepsy no  3. Eye disorders or impaired vision (except corrective lenses) - wears glasses to drive no  4. Ear disorders, loss of hearing or balance no  5. Heart disease or heart attack, other cardiovascular condition no  6. Heart surgery (valve replacement/bypass, angioplasty, pacemaker/defribrillator) yes  7. High blood pressure yes  8. High holesterol no  9. Chronic cough, shortness of breath or other breathing problems no  10. Lung disease (emphysema, asthma or chronic bronchitis) no  11. Kidney disease, dialysis no  12. Digestive problems  no  13. Diabetes or elevated blood sugar  no  Insulin use yes  14. Nervous or psychiatric disorders, e.g., severe depression - well controlled no  15. Fainting or syncope no  16. Dizziness, headaches, numbness, tingling or memory loss no  17. Unexplained weight loss no  18. Stroke, TIA or paralysis no  19. Missing or impaired hand, arm, foot, leg, finger, toe no  20. Spinal injury or disease no  21. Bone, muscles or nerve problems no  22. Blood clots or bleeding bleeding disorders no  23. Cancer no  24. Chronic infection or other chronic diseases no  25. Sleep disorders, pauses in  breathing while asleep, daytime sleepiness, loud snoring yes  26. Have you ever had a sleep test? - normal yes  27.  Have you ever spent a night in the hospital? no  28. Have you ever had a broken bone? no  29. Have you or or do you use tobacco products? no  30. Regular, frequent alcohol use no  31. Illegal substance use within the past 2 years no  32.  Have you ever failed a drug test or been dependent on an illegal substance?  Current Medications: Prior to Admission medications   Medication Sig Start Date End Date Taking? Authorizing Provider  acetaminophen (TYLENOL) 500 MG tablet Take 1,000 mg by mouth daily as needed (cramping).   Yes [provider]  albuterol (PROVENTIL HFA;VENTOLIN HFA) 108 (90 BASE) MCG/ACT inhaler Inhale 2 puffs into the lungs every 6 (six) hours as needed. Shortness of breath 03/20/14  Yes Biagio Borg, MD  amLODipine (NORVASC) 5 MG tablet TAKE ONE TABLET BY MOUTH ONCE DAILY 12/16/16  Yes Biagio Borg, MD  clonazePAM (KLONOPIN) 0.5 MG tablet TAKE ONE TABLET BY MOUTH TWICE DAILY AS NEEDED FOR ANXIETY Patient taking differently: TAKE ONE TABLET BY MOUTH  DAILY  AT NIGHT AS NEEDED FOR ANXIETY 06/09/16  Yes Biagio Borg, MD  hydrochlorothiazide (MICROZIDE) 12.5 MG capsule Take 1 capsule (12.5 mg total) by mouth daily. Patient taking differently: Take 12.5 mg by mouth every other day.  02/19/16  Yes Biagio Borg, MD  pantoprazole (PROTONIX) 40 MG tablet Take 1 tablet (40  mg total) by mouth daily. Patient taking differently: Take 40 mg by mouth daily as needed.  05/25/16  Yes Biagio Borg, MD  sertraline (ZOLOFT) 50 MG tablet TAKE ONE TABLET BY MOUTH ONCE DAILY 06/01/16  Yes Biagio Borg, MD  triamcinolone (NASACORT AQ) 55 MCG/ACT AERO nasal inhaler Place 2 sprays into the nose daily. Patient taking differently: Place 2 sprays into the nose daily as needed (sinus).  05/25/16  Yes Biagio Borg, MD  valsartan (DIOVAN) 160 MG tablet Take 1 tablet (160 mg total) by  mouth daily. 05/13/16  Yes Biagio Borg, MD     TESTING:   Visual Acuity Screening   Right eye Left eye Both eyes  Without correction:     With correction: 20/20 20/20 20/20   Comments: Peripheral Vision: Right eye 85 degrees. Left eye 85 degrees.  The patient can distinguish the colors red, amber and green.  Hearing Screening Comments: The patient was able to hear a forced whisper from 10 feet.  Monocular Vision: No.  Hearing Aid used for test: No. Hearing Aid required to to meet standard: No.  BP 136/78   Pulse 88   Temp 98.2 F (36.8 C) (Oral)   Resp 16   Ht 5\' 1"  (1.549 m)   Wt 172 lb 3.2 oz (78.1 kg)   LMP 10/01/2016   SpO2 99%   BMI 32.54 kg/m  Pulse rate is regular  Comments: URINE SG: 1.010 BLO:neg PRO:neg GLU:neg  PHYSICAL EXAMINATION:  1. Normal  General Appearance: Marked overweight, tremor, signs of alcoholism, problem drinking or drug abuse. 2. Normal Skin Exam - tattoos, scars 3. Normal Eyes: pupillary equality, reaction to light, accommodation, ocular motility, ocular muscle imbalance, extra ocular movement, nystagmus, exopthalmos. Ask about retinopathy, cataracts, aphakia, glaucoma, macular degeneration and refer to a specialist if appropriate.  4. Normal Ears: Scarring of tympanic membrane, occlusion of external canal, perforated eardrums.     5. Normal Mouth and Throat: Irremedial deformities likely to interfere with breathing or swallowing.    6. Normal Heart: Murmurs, extra sounds, enlarged heart, pacemaker, implantable defibrillator.     7. Normal Lungs and Chest, not including breast examination: Abnormal Chest wall expansion, abnormal respiratory rate, abnormal breath sounds including wheezes or alveolar rates, impaired respiratory function, cyanosis. Abnormal findings on physical exam may require further testing such as pulmonary tests and/or x ray of chest.  8. Normal Abdomen and Viscera: Enlarged liver, enlarged spleen, masses, bruits, hernia,  significant abdominal wall muscle weakness.  9. Normal Genitourinary System: Hernia  10. Normal Spine, other musculoskeletal: Previous surgery, deformities, limitation of motion, tenderness. 11. Normal Extremities-Limb impaired: Loss or impairment of leg, foot, toe, arm, hand, finger. Perceptible limp, deformities, atrophy, weakness, paralysis, clubbing, edema, hypotonia. Insufficient grasp and prehension to maintain steering wheel grip. Insufficient mobility and strength in lower limb to operate pedals properly. 12. Normal Neurological: Impaired equilibrium, coordination or speech pattern; paresthesia, asymmetric deep tendon reflexes, sensory or positional abnormalities, abnormal patellar and Babinski's reflexes 13. Normal Gait - antalgic, ataxia  14. Normal Vascular System: Abnormal pulse and amplitude, carotid or arterial bruits, varicose veins.   Does not meet standards. Certification Status: does not meet standards for 2 year certificate. Meets standards, but periodic monitoring required due to: HTN  Driver qualified only for:  1 year   Wearing corrective lenses: yes Wearing hearing aid: no Accompanied by a no waiver/exemption Skill performance Evaluation (SPE) Certificate: no Driving within an exempt intracity zone: no Qualified by operation of  49 CFR 802.23: no  Certification expires 01/07/2018  Windell Hummingbird PA-C  [facility] 01/07/2017 12:07 PM

## 2017-01-09 NOTE — Assessment & Plan Note (Signed)
Mild to mod, for antibx course,  to f/u any worsening symptoms or concerns 

## 2017-01-09 NOTE — Assessment & Plan Note (Signed)
Mild elevated likely situational, o/w stable overall by history and exam, recent data reviewed with pt, and pt to continue medical treatment as before,  to f/u any worsening symptoms or concerns BP Readings from Last 3 Encounters:  01/07/17 (!) 144/98  01/07/17 136/78  11/23/16 130/80

## 2017-01-09 NOTE — Assessment & Plan Note (Signed)
Mild to mod, for allegra D prn asd, to f/u any worsening symptoms or concerns

## 2017-01-28 ENCOUNTER — Ambulatory Visit: Payer: BC Managed Care – PPO | Admitting: Internal Medicine

## 2017-01-29 ENCOUNTER — Ambulatory Visit: Payer: BC Managed Care – PPO | Admitting: Family Medicine

## 2017-02-15 ENCOUNTER — Other Ambulatory Visit: Payer: Self-pay | Admitting: Internal Medicine

## 2017-02-15 MED ORDER — LOSARTAN POTASSIUM 100 MG PO TABS: 100.0000 mg | ORAL_TABLET | Freq: Every day | ORAL | 3 refills | Status: DC

## 2017-02-15 NOTE — Telephone Encounter (Signed)
Received information from [place] regarding need to change diova  Ok for losartan 100 mg - done erx

## 2017-03-12 ENCOUNTER — Other Ambulatory Visit: Payer: Self-pay | Admitting: Internal Medicine

## 2017-03-13 ENCOUNTER — Other Ambulatory Visit: Payer: Self-pay | Admitting: Internal Medicine

## 2017-03-14 ENCOUNTER — Telehealth: Payer: Self-pay | Admitting: Internal Medicine

## 2017-03-14 NOTE — Telephone Encounter (Signed)
Antibiotic (LeVaquin) should have not been refilled sent by mistake. Called [facility] spoke w/Dorian inform him the antibiotic was sent by mistake. Only giving refill on diflucan. If pt want antibiotic she will need OV...Lind Guest

## 2017-03-14 NOTE — Telephone Encounter (Signed)
[name] with [facility] called reporting a drug interaction between fluconazole (DIFLUCAN) 150 MG tablet  And  levofloxacin (LEVAQUIN) 500 MG tablet  The interaction causes dysrhythmia   Can call back at [phone] Please advise

## 2017-03-15 ENCOUNTER — Ambulatory Visit: Payer: BC Managed Care – PPO | Admitting: Internal Medicine

## 2017-03-15 ENCOUNTER — Encounter: Payer: Self-pay | Admitting: Family Medicine

## 2017-03-15 ENCOUNTER — Ambulatory Visit (INDEPENDENT_AMBULATORY_CARE_PROVIDER_SITE_OTHER): Payer: BC Managed Care – PPO | Admitting: Family Medicine

## 2017-03-15 VITALS — BP 130/90 | HR 87 | Temp 98.3°F | Ht 61.0 in | Wt 176.0 lb

## 2017-03-15 DIAGNOSIS — J069 Acute upper respiratory infection, unspecified: Secondary | ICD-10-CM

## 2017-03-15 NOTE — Patient Instructions (Addendum)
Thank you for coming in,   Please try things such as zyrtec-D or allegra-D which is an antihistamine and decongestant.   Please try afrin which will help with nasal congestion but use for only three days.   Please also try using a netti pot on a regular occasion.  Please try ibuprofen or tylenol for your headache.    Please feel free to call with any questions or concerns at any time, at [phone]. --Dr. Raeford Razor

## 2017-03-15 NOTE — Assessment & Plan Note (Signed)
Symptoms seem to be viral in nature - Supportive care provided - The symptoms show no improvement can consider azithromycin

## 2017-03-15 NOTE — Progress Notes (Signed)
[name] - 51 y.o. female MRN [id number]  Date of birth: 01-16-1966  SUBJECTIVE:  Including CC & ROS.  Chief Complaint  Patient presents with  . Sinus infection    Patient states that she has not had an appetite latley and has head pressure and mucus is yellow color. Started saturday     [name] Is a 51 year old female that is presenting with sinus complaints. She reports feeling congested and having pressure. Pain is the front of her head. She denies any fevers or chills. She denies any tooth pain. She has flown recently. She has not tried any over-the-counter medications. The symptoms seem to be constant in nature. She denies any ear pain.     Review of Systems  Constitutional: Negative for fever.  HENT: Positive for sinus pressure. Negative for ear pain.     HISTORY: Past Medical, Surgical, Social, and Family History Reviewed & Updated per EMR.   Pertinent Historical Findings include:  Past Medical History:  Diagnosis Date  . Allergic rhinitis   . Allergy   . Depression   . GERD (gastroesophageal reflux disease)   . Hyperplastic colon polyp 10/2003  . Hypertension   . Menorrhagia   . Obesity   . PONV (postoperative nausea and vomiting)   . RLS (restless legs syndrome)     Past Surgical History:  Procedure Laterality Date  . COLONOSCOPY    . CYSTOSCOPY N/A 10/12/2016   Procedure: CYSTOSCOPY;  Surgeon: Anastasio Auerbach, MD;  Location: [facility];  Service: Gynecology;  Laterality: N/A;  . ENDOMETRIAL ABLATION  2007   Dr. Landry Mellow at [place]  . FOOT SURGERY    . LAPAROSCOPIC VAGINAL HYSTERECTOMY WITH SALPINGECTOMY Bilateral 10/12/2016   Procedure: LAPAROSCOPIC ASSISTED VAGINAL HYSTERECTOMY WITH SALPINGECTOMY;  Surgeon: Anastasio Auerbach, MD;  Location: [facility];  Service: Gynecology;  Laterality: Bilateral;  request to follow first case at around 10:30am.  Requests 2 1/2 hours for the case.  Marland Kitchen SALIVARY GLAND SURGERY  12-11   LEFT SIDE NODULE REMOVED WAS BENIGN  . TUBAL  LIGATION      Allergies  Allergen Reactions  . Other     POLLEN AND DUST  . Sulfur Hives    Family History  Problem Relation Age of Onset  . Cancer Mother        colon cancer at 83 yo  . Colon polyps Mother   . Hyperlipidemia Other   . Hypertension Other   . Heart attack Maternal Grandfather   . Esophageal cancer Neg Hx   . Rectal cancer Neg Hx   . Stomach cancer Neg Hx      Social History   Social History  . Marital status: Divorced    Spouse name: N/A  . Number of children: 2  . Years of education: N/A   Occupational History  . [employer]  .  [place] History Main Topics  . Smoking status: Never Smoker  . Smokeless tobacco: Never Used  . Alcohol use 0.0 oz/week     Comment: some  . Drug use: No  . Sexual activity: Not Currently    Birth control/ protection: Surgical     Comment: tubal ligation,INTERCOUSRE AGE 53, SEXUAL PARTNERS QUESTION DECLINED   Other Topics Concern  . Not on file   Social History Narrative   Patient does not get regular exercise     PHYSICAL EXAM:  VS: BP 130/90 (BP Location: Left Arm, Patient Position: Sitting,  Cuff Size: Normal)   Pulse 87   Temp 98.3 F (36.8 C) (Oral)   Ht 5\' 1"  (1.549 m)   Wt 176 lb (79.8 kg)   LMP 10/01/2016   SpO2 97%   BMI 33.25 kg/m  Physical Exam Gen: NAD, alert, cooperative with exam,  ENT: normal lips, normal nasal mucosa, some frontal sinus tenderness, tympanic membranes clear and intact bilaterally, no cervical lymphadenopathy, no tonsillar exudates, oropharynx clear Eye: normal EOM, normal conjunctiva and lids CV:  no edema, +2 pedal pulses, S1-S2, regular rate and rhythm   Resp: no accessory muscle use, non-labored, clear to auscultation bilaterally, no wheezes or crackles Skin: no rashes, no areas of induration  Neuro: normal tone, normal sensation to touch Psych:  normal insight, alert and oriented MSK: Normal gait, normal strength       ASSESSMENT & PLAN:   Acute upper respiratory infection Symptoms seem to be viral in nature - Supportive care provided - The symptoms show no improvement can consider azithromycin

## 2017-03-24 ENCOUNTER — Ambulatory Visit (INDEPENDENT_AMBULATORY_CARE_PROVIDER_SITE_OTHER): Payer: BC Managed Care – PPO | Admitting: Internal Medicine

## 2017-03-24 ENCOUNTER — Encounter: Payer: Self-pay | Admitting: Internal Medicine

## 2017-03-24 VITALS — BP 144/88 | HR 82 | Temp 98.7°F | Ht 61.0 in | Wt 176.0 lb

## 2017-03-24 DIAGNOSIS — R110 Nausea: Secondary | ICD-10-CM | POA: Insufficient documentation

## 2017-03-24 DIAGNOSIS — R0600 Dyspnea, unspecified: Secondary | ICD-10-CM | POA: Insufficient documentation

## 2017-03-24 DIAGNOSIS — I10 Essential (primary) hypertension: Secondary | ICD-10-CM | POA: Diagnosis not present

## 2017-03-24 DIAGNOSIS — J019 Acute sinusitis, unspecified: Secondary | ICD-10-CM | POA: Diagnosis not present

## 2017-03-24 MED ORDER — ONDANSETRON HCL 4 MG PO TABS: 4.0000 mg | ORAL_TABLET | Freq: Three times a day (TID) | ORAL | 0 refills | Status: DC | PRN

## 2017-03-24 MED ORDER — FLUCONAZOLE 150 MG PO TABS
ORAL_TABLET | ORAL | 1 refills | Status: DC
Start: 2017-03-24 — End: 2017-05-27

## 2017-03-24 MED ORDER — LEVOFLOXACIN 500 MG PO TABS: 500.0000 mg | ORAL_TABLET | Freq: Every day | ORAL | 0 refills | Status: AC

## 2017-03-24 NOTE — Assessment & Plan Note (Signed)
Mild elevated likely situational, to cont same tx and cont to monitor

## 2017-03-24 NOTE — Progress Notes (Signed)
Subjective:    Patient ID: [name], female    DOB: Jul 15, 1966, 51 y.o.   MRN: [id number]  HPI   Here with 2-3 days acute onset fever, facial pain, pressure, headache, general weakness and malaise, and greenish d/c, with mild ST and cough and mild dyspnea/sob, but pt denies chest pain, wheezing, orthopnea, PND, increased LE swelling, palpitations, dizziness or syncope.  Pt denies new neurological symptoms such as new headache, or facial or extremity weakness or numbness   Pt denies polydipsia, polyuria, Past Medical History:  Diagnosis Date  . Allergic rhinitis   . Allergy   . Depression   . GERD (gastroesophageal reflux disease)   . Hyperplastic colon polyp 10/2003  . Hypertension   . Menorrhagia   . Obesity   . PONV (postoperative nausea and vomiting)   . RLS (restless legs syndrome)    Past Surgical History:  Procedure Laterality Date  . COLONOSCOPY    . CYSTOSCOPY N/A 10/12/2016   Procedure: CYSTOSCOPY;  Surgeon: Anastasio Auerbach, MD;  Location: [facility];  Service: Gynecology;  Laterality: N/A;  . ENDOMETRIAL ABLATION  2007   Dr. Landry Mellow at [facility]  . FOOT SURGERY    . LAPAROSCOPIC VAGINAL HYSTERECTOMY WITH SALPINGECTOMY Bilateral 10/12/2016   Procedure: LAPAROSCOPIC ASSISTED VAGINAL HYSTERECTOMY WITH SALPINGECTOMY;  Surgeon: Anastasio Auerbach, MD;  Location: [facility];  Service: Gynecology;  Laterality: Bilateral;  request to follow first case at around 10:30am.  Requests 2 1/2 hours for the case.  Marland Kitchen SALIVARY GLAND SURGERY  12-11   LEFT SIDE NODULE REMOVED WAS BENIGN  . TUBAL LIGATION      reports that she has never smoked. She has never used smokeless tobacco. She reports that she drinks alcohol. She reports that she does not use drugs. family history includes Cancer in her mother; Colon polyps in her mother; Heart attack in her maternal grandfather; Hyperlipidemia in her other; Hypertension in her other. Allergies  Allergen Reactions  . Other     POLLEN AND DUST  .  Sulfur Hives   Current Outpatient Prescriptions on File Prior to Visit  Medication Sig Dispense Refill  . acetaminophen (TYLENOL) 500 MG tablet Take 1,000 mg by mouth daily as needed (cramping).    Marland Kitchen albuterol (PROVENTIL HFA;VENTOLIN HFA) 108 (90 BASE) MCG/ACT inhaler Inhale 2 puffs into the lungs every 6 (six) hours as needed. Shortness of breath 1 Inhaler 5  . amLODipine (NORVASC) 5 MG tablet TAKE ONE TABLET BY MOUTH ONCE DAILY 90 tablet 1  . clonazePAM (KLONOPIN) 0.5 MG tablet TAKE ONE TABLET BY MOUTH TWICE DAILY AS NEEDED FOR ANXIETY 60 tablet 2  . fexofenadine-pseudoephedrine (ALLEGRA-D ALLERGY & CONGESTION) 180-240 MG 24 hr tablet Take 1 tablet by mouth daily. 30 tablet 1  . hydrochlorothiazide (MICROZIDE) 12.5 MG capsule Take 1 capsule (12.5 mg total) by mouth daily. (Patient taking differently: Take 12.5 mg by mouth every other day. ) 90 capsule 1  . LINZESS 145 MCG CAPS capsule TAKE ONE CAPSULE BY MOUTH ONCE DAILY 30 capsule 11  . losartan (COZAAR) 100 MG tablet Take 1 tablet (100 mg total) by mouth daily. 90 tablet 3  . pantoprazole (PROTONIX) 40 MG tablet Take 1 tablet (40 mg total) by mouth daily. (Patient taking differently: Take 40 mg by mouth daily as needed. ) 90 tablet 3  . sertraline (ZOLOFT) 50 MG tablet TAKE ONE TABLET BY MOUTH ONCE DAILY 90 tablet 3  . triamcinolone (NASACORT AQ) 55 MCG/ACT AERO nasal inhaler Place 2  sprays into the nose daily. (Patient taking differently: Place 2 sprays into the nose daily as needed (sinus). ) 1 Inhaler 12  . [DISCONTINUED] fluticasone (FLONASE) 50 MCG/ACT nasal spray 2 sprays by Nasal route daily. 16 g 2   No current facility-administered medications on file prior to visit.    Review of Systems  Constitutional: Negative for other unusual diaphoresis or sweats HENT: Negative for ear discharge or swelling Eyes: Negative for other worsening visual disturbances Respiratory: Negative for stridor or other swelling  Gastrointestinal: Negative  for worsening distension or other blood Genitourinary: Negative for retention or other urinary change Musculoskeletal: Negative for other MSK pain or swelling Skin: Negative for color change or other new lesions Neurological: Negative for worsening tremors and other numbness  Psychiatric/Behavioral: Negative for worsening agitation or other fatigue All other system neg per pt    Objective:   Physical Exam BP (!) 144/88   Pulse 82   Temp 98.7 F (37.1 C) (Oral)   Ht 5\' 1"  (1.549 m)   Wt 176 lb (79.8 kg)   LMP 10/01/2016   SpO2 100%   BMI 33.25 kg/m  VS noted, mild ill Constitutional: Pt appears in NAD HENT: Head: NCAT.  Right Ear: External ear normal.  Left Ear: External ear normal.  Eyes: . Pupils are equal, round, and reactive to light. Conjunctivae and EOM are normal Nose: without d/c or deformity Bilat tm's with mild erythema.  Max sinus areas mild tender.  Pharynx with mild erythema, no exudate Neck: Neck supple. Gross normal ROM Cardiovascular: Normal rate and regular rhythm.   Pulmonary/Chest: Effort normal and breath sounds without rales and no wheezing appreciated.  Abd:  Soft, NT, ND, + BS, no organomegaly - benign, no flank tender Neurological: Pt is alert. At baseline orientation, motor grossly intact Skin: Skin is warm. No rashes, other new lesions, no LE edema Psychiatric: Pt behavior is normal without agitation  No other exam findings       Assessment & Plan:

## 2017-03-24 NOTE — Assessment & Plan Note (Signed)
Mild to mod, for antibx course,  to f/u any worsening symptoms or concerns 

## 2017-03-24 NOTE — Assessment & Plan Note (Signed)
Exam benign, ok for antiemetic prn,  to f/u any worsening symptoms or concerns

## 2017-03-24 NOTE — Patient Instructions (Signed)
Please take all new medication as prescribed - the antibiotic and diflucan if needed, and the nausea medication as needed  Please continue all other medications as before  Please have the pharmacy call with any other refills you may need.  Please keep your appointments with your specialists as you may have planned

## 2017-03-24 NOTE — Assessment & Plan Note (Signed)
No wheezing, declines cxr,  to f/u any worsening symptoms or concerns

## 2017-03-25 ENCOUNTER — Telehealth: Payer: Self-pay | Admitting: Internal Medicine

## 2017-03-25 MED ORDER — TERCONAZOLE 0.4 % VA CREA: 1.0000 | TOPICAL_CREAM | Freq: Every day | VAGINAL | 0 refills | Status: DC

## 2017-03-25 NOTE — Telephone Encounter (Signed)
Called pt, LVM informing her via VM.

## 2017-03-25 NOTE — Telephone Encounter (Signed)
Pt called stating her insurance company will not pay for fluconazole (DIFLUCAN) 150 MG tablet She would like to know if we have samples of this medication or if an alternative can be called into her pharmacy .  Please advise and call back

## 2017-03-25 NOTE — Telephone Encounter (Signed)
Ok for change to terconozole va cream - done erx

## 2017-03-29 ENCOUNTER — Other Ambulatory Visit: Payer: Self-pay | Admitting: Internal Medicine

## 2017-03-29 ENCOUNTER — Telehealth: Payer: Self-pay | Admitting: Internal Medicine

## 2017-03-29 DIAGNOSIS — J329 Chronic sinusitis, unspecified: Secondary | ICD-10-CM

## 2017-03-29 NOTE — Telephone Encounter (Signed)
Pt  Would like  To know if she can be referred to a ENT, she states her sinuses have not improved and she now has a cough. Please advise

## 2017-03-29 NOTE — Telephone Encounter (Signed)
Done per emr 

## 2017-03-29 NOTE — Telephone Encounter (Signed)
Pt has been informed and expressed understanding.  

## 2017-04-19 DIAGNOSIS — J342 Deviated nasal septum: Secondary | ICD-10-CM | POA: Insufficient documentation

## 2017-04-27 ENCOUNTER — Telehealth: Payer: Self-pay | Admitting: *Deleted

## 2017-04-27 NOTE — Telephone Encounter (Signed)
Since losartan does not do this I would not feel this needs to be changed.   I realize it might say swelling on the side effects list on your paper from the phamacy, but this is only lawyer talk so they will not be sued

## 2017-04-27 NOTE — Telephone Encounter (Signed)
Pt left msg on triage stating she is having some swelling from the Losartan in her feet and hands. Requesting MD to rx something else...[name]

## 2017-04-28 NOTE — Telephone Encounter (Signed)
Called pt no answer LMOM w/MD response../lmb 

## 2017-05-17 DIAGNOSIS — J343 Hypertrophy of nasal turbinates: Secondary | ICD-10-CM | POA: Insufficient documentation

## 2017-05-19 ENCOUNTER — Encounter: Payer: BC Managed Care – PPO | Admitting: Women's Health

## 2017-05-22 ENCOUNTER — Encounter: Payer: Self-pay | Admitting: Women's Health

## 2017-05-23 ENCOUNTER — Encounter: Payer: BC Managed Care – PPO | Admitting: Women's Health

## 2017-05-27 ENCOUNTER — Other Ambulatory Visit (INDEPENDENT_AMBULATORY_CARE_PROVIDER_SITE_OTHER): Payer: BC Managed Care – PPO

## 2017-05-27 ENCOUNTER — Encounter: Payer: Self-pay | Admitting: Internal Medicine

## 2017-05-27 ENCOUNTER — Ambulatory Visit (INDEPENDENT_AMBULATORY_CARE_PROVIDER_SITE_OTHER): Payer: BC Managed Care – PPO | Admitting: Internal Medicine

## 2017-05-27 VITALS — BP 130/88 | HR 88 | Temp 98.4°F | Ht 61.0 in | Wt 181.0 lb

## 2017-05-27 DIAGNOSIS — Z23 Encounter for immunization: Secondary | ICD-10-CM | POA: Diagnosis not present

## 2017-05-27 DIAGNOSIS — F32A Depression, unspecified: Secondary | ICD-10-CM

## 2017-05-27 DIAGNOSIS — F329 Major depressive disorder, single episode, unspecified: Secondary | ICD-10-CM | POA: Diagnosis not present

## 2017-05-27 DIAGNOSIS — Z0000 Encounter for general adult medical examination without abnormal findings: Secondary | ICD-10-CM | POA: Diagnosis not present

## 2017-05-27 DIAGNOSIS — Z0001 Encounter for general adult medical examination with abnormal findings: Secondary | ICD-10-CM | POA: Diagnosis not present

## 2017-05-27 LAB — HEPATIC FUNCTION PANEL
ALK PHOS: 50 U/L (ref 39–117)
ALT: 6 U/L (ref 0–35)
AST: 14 U/L (ref 0–37)
Albumin: 4.5 g/dL (ref 3.5–5.2)
BILIRUBIN DIRECT: 0.1 mg/dL (ref 0.0–0.3)
BILIRUBIN TOTAL: 0.7 mg/dL (ref 0.2–1.2)
Total Protein: 8.1 g/dL (ref 6.0–8.3)

## 2017-05-27 LAB — URINALYSIS, ROUTINE W REFLEX MICROSCOPIC
Bilirubin Urine: NEGATIVE
Hgb urine dipstick: NEGATIVE
Ketones, ur: NEGATIVE
Nitrite: NEGATIVE
RBC / HPF: NONE SEEN (ref 0–?)
Specific Gravity, Urine: 1.015 (ref 1.000–1.030)
Total Protein, Urine: NEGATIVE
Urine Glucose: NEGATIVE
Urobilinogen, UA: 0.2 (ref 0.0–1.0)
pH: 6.5 (ref 5.0–8.0)

## 2017-05-27 LAB — CBC WITH DIFFERENTIAL/PLATELET
Basophils Absolute: 0 K/uL (ref 0.0–0.1)
Basophils Relative: 0.3 % (ref 0.0–3.0)
Eosinophils Absolute: 0 K/uL (ref 0.0–0.7)
Eosinophils Relative: 0.3 % (ref 0.0–5.0)
HCT: 38.8 % (ref 36.0–46.0)
Hemoglobin: 12.8 g/dL (ref 12.0–15.0)
Lymphocytes Relative: 30.5 % (ref 12.0–46.0)
Lymphs Abs: 2.4 K/uL (ref 0.7–4.0)
MCHC: 32.9 g/dL (ref 30.0–36.0)
MCV: 91.7 fl (ref 78.0–100.0)
Monocytes Absolute: 0.8 K/uL (ref 0.1–1.0)
Monocytes Relative: 10.7 % (ref 3.0–12.0)
Neutro Abs: 4.5 K/uL (ref 1.4–7.7)
Neutrophils Relative %: 58.2 % (ref 43.0–77.0)
Platelets: 237 K/uL (ref 150.0–400.0)
RBC: 4.23 Mil/uL (ref 3.87–5.11)
RDW: 15.1 % (ref 11.5–15.5)
WBC: 7.8 K/uL (ref 4.0–10.5)

## 2017-05-27 LAB — BASIC METABOLIC PANEL WITH GFR
BUN: 10 mg/dL (ref 6–23)
CO2: 29 meq/L (ref 19–32)
Calcium: 10 mg/dL (ref 8.4–10.5)
Chloride: 101 meq/L (ref 96–112)
Creatinine, Ser: 0.8 mg/dL (ref 0.40–1.20)
GFR: 98.08 mL/min (ref >60.00)
Glucose, Bld: 96 mg/dL (ref 70–99)
Potassium: 3.8 meq/L (ref 3.5–5.1)
Sodium: 138 meq/L (ref 135–145)

## 2017-05-27 LAB — LIPID PANEL
CHOL/HDL RATIO: 4
CHOLESTEROL: 215 mg/dL — AB (ref 0–200)
HDL: 48.8 mg/dL (ref >39.00)
LDL CALC: 147 mg/dL — AB (ref 0–99)
NonHDL: 165.89
TRIGLYCERIDES: 96 mg/dL (ref 0.0–149.0)
VLDL: 19.2 mg/dL (ref 0.0–40.0)

## 2017-05-27 LAB — TSH: TSH: 0.73 u[IU]/mL (ref 0.35–4.50)

## 2017-05-27 MED ORDER — AMLODIPINE BESYLATE 5 MG PO TABS: 5.0000 mg | ORAL_TABLET | Freq: Every day | ORAL | 3 refills | Status: DC

## 2017-05-27 MED ORDER — HYDROCHLOROTHIAZIDE 12.5 MG PO CAPS: 12.5000 mg | ORAL_CAPSULE | Freq: Every day | ORAL | 3 refills | Status: DC

## 2017-05-27 MED ORDER — LINACLOTIDE 145 MCG PO CAPS: 145.0000 ug | ORAL_CAPSULE | Freq: Every day | ORAL | 11 refills | Status: DC

## 2017-05-27 MED ORDER — MIRTAZAPINE 15 MG PO TABS: 15.0000 mg | ORAL_TABLET | Freq: Every day | ORAL | 11 refills | Status: DC

## 2017-05-27 NOTE — Progress Notes (Signed)
Subjective:    Patient ID: [name], female    DOB: [birth year], 51 y.o.   MRN: [id number]  HPI  Here for wellness and f/u;  Overall doing ok;  Pt denies Chest pain, worsening SOB, DOE, wheezing, orthopnea, PND, worsening LE edema, palpitations, dizziness or syncope.  Pt denies neurological change such as new headache, facial or extremity weakness.  Pt denies polydipsia, polyuria, or low sugar symptoms. Pt states overall good compliance with treatment and medications, good tolerability, and has been trying to follow appropriate diet.  Pt denies worsening depressive symptoms, suicidal ideation or panic. No fever, night sweats, wt loss, loss of appetite, or other constitutional symptoms.  Pt states good ability with ADL's, has low fall risk, home safety reviewed and adequate, no other significant changes in hearing or vision, and only occasionally active with exercise. Has appt with GYN for next wk  Asks for flu shot  Also mentions her son with bipolar and shcizophrenia and she is primary caretaker.  Working 2 jobs as Recruitment consultant,  Quite stressed  Stopped the klonopin trying to do ok on her own, and doing ok for now without  Tends to have early AM awakening most days, but only in last month or so.  Has been to court off and on for guardianship to be placed to someone else or DSS, but not approved.   She is working for long term placement for him.  Is not wanting further med for depression unless there is somehting to help with sleep as well. Asks for 2 days off work to regroup.  No SI or HI.  Not taking the zoloft, but was low dose and did not seem to help Past Medical History:  Diagnosis Date  . Allergic rhinitis   . Allergy   . Depression   . Hyperplastic colon polyp 10/2003  . Menorrhagia   . Obesity   . PONV (postoperative nausea and vomiting)   . RLS (restless legs syndrome)    Past Surgical History:  Procedure Laterality Date  . COLONOSCOPY    . ENDOMETRIAL ABLATION  2007   Dr. Landry Mellow at  [facility]  . FOOT SURGERY    . SALIVARY GLAND SURGERY  12-11   LEFT SIDE NODULE REMOVED WAS BENIGN  . TUBAL LIGATION      reports that  has never smoked. she has never used smokeless tobacco. She reports that she drinks alcohol. She reports that she does not use drugs. family history includes Cancer in her mother; Colon polyps in her mother; Heart attack in her maternal grandfather; Hyperlipidemia in her other; Hypertension in her other. Allergies  Allergen Reactions  . Other     POLLEN AND DUST  . Sulfur Hives   Current Outpatient Medications on File Prior to Visit  Medication Sig Dispense Refill  . acetaminophen (TYLENOL) 500 MG tablet Take 1,000 mg by mouth daily as needed (cramping).    Marland Kitchen albuterol (PROVENTIL HFA;VENTOLIN HFA) 108 (90 BASE) MCG/ACT inhaler Inhale 2 puffs into the lungs every 6 (six) hours as needed. Shortness of breath 1 Inhaler 5  . fexofenadine-pseudoephedrine (ALLEGRA-D ALLERGY & CONGESTION) 180-240 MG 24 hr tablet Take 1 tablet by mouth daily. 30 tablet 1  . losartan (COZAAR) 100 MG tablet Take 1 tablet (100 mg total) by mouth daily. 90 tablet 3  . ondansetron (ZOFRAN) 4 MG tablet Take 1 tablet (4 mg total) by mouth every 8 (eight) hours as needed for nausea or vomiting. 20 tablet 0  .  pantoprazole (PROTONIX) 40 MG tablet Take 1 tablet (40 mg total) by mouth daily. (Patient taking differently: Take 40 mg by mouth daily as needed. ) 90 tablet 3  . terconazole (TERAZOL 7) 0.4 % vaginal cream Place 1 applicator vaginally at bedtime. For 7 days or until improved 45 g 0  . triamcinolone (NASACORT AQ) 55 MCG/ACT AERO nasal inhaler Place 2 sprays into the nose daily. (Patient taking differently: Place 2 sprays into the nose daily as needed (sinus). ) 1 Inhaler 12  . [DISCONTINUED] fluticasone (FLONASE) 50 MCG/ACT nasal spray 2 sprays by Nasal route daily. 16 g 2   No current facility-administered medications on file prior to visit.    Review of Systems Constitutional:  Negative for other unusual diaphoresis, sweats, appetite or weight changes HENT: Negative for other worsening hearing loss, ear pain, facial swelling, mouth sores or neck stiffness.   Eyes: Negative for other worsening pain, redness or other visual disturbance.  Respiratory: Negative for other stridor or swelling Cardiovascular: Negative for other palpitations or other chest pain  Gastrointestinal: Negative for worsening diarrhea or loose stools, blood in stool, distention or other pain Genitourinary: Negative for hematuria, flank pain or other change in urine volume.  Musculoskeletal: Negative for myalgias or other joint swelling.  Skin: Negative for other color change, or other wound or worsening drainage.  Neurological: Negative for other syncope or numbness. Hematological: Negative for other adenopathy or swelling Psychiatric/Behavioral: Negative for hallucinations, other worsening agitation, SI, self-injury, or new decreased concentration  All other system neg per pt    Objective:   Physical Exam BP 130/88   Pulse 88   Temp 98.4 F (36.9 C) (Oral)   Ht 5\' 1"  (1.549 m)   Wt 181 lb (82.1 kg)   LMP 10/01/2016   SpO2 96%   BMI 34.20 kg/m  VS noted,  Constitutional: Pt is oriented to person, place, and time. Appears well-developed and well-nourished, in no significant distress and comfortable Head: Normocephalic and atraumatic  Eyes: Conjunctivae and EOM are normal. Pupils are equal, round, and reactive to light Right Ear: External ear normal without discharge Left Ear: External ear normal without discharge Nose: Nose without discharge or deformity Mouth/Throat: Oropharynx is without other ulcerations and moist  Neck: Normal range of motion. Neck supple. No JVD present. No tracheal deviation present or significant neck LA or mass Cardiovascular: Normal rate, regular rhythm, normal heart sounds and intact distal pulses.   Pulmonary/Chest: WOB normal and breath sounds without rales  or wheezing  Abdominal: Soft. Bowel sounds are normal. NT. No HSM  Musculoskeletal: Normal range of motion. Exhibits no edema Lymphadenopathy: Has no other cervical adenopathy.  Neurological: Pt is alert and oriented to person, place, and time. Pt has normal reflexes. No cranial nerve deficit. Motor grossly intact, Gait intact Skin: Skin is warm and dry. No rash noted or new ulcerations Psychiatric:  Has dysphoric mood and affect. Behavior is normal without agitation No other exam findings Lab Results  Component Value Date   WBC 7.8 05/27/2017   HGB 12.8 05/27/2017   HCT 38.8 05/27/2017   PLT 237.0 05/27/2017   GLUCOSE 96 05/27/2017   CHOL 215 (H) 05/27/2017   TRIG 96.0 05/27/2017   HDL 48.80 05/27/2017   LDLDIRECT 156.0 04/17/2013   LDLCALC 147 (H) 05/27/2017   ALT 6 05/27/2017   AST 14 05/27/2017   NA 138 05/27/2017   K 3.8 05/27/2017   CL 101 05/27/2017   CREATININE 0.80 05/27/2017   BUN 10  05/27/2017   CO2 29 05/27/2017   TSH 0.73 05/27/2017      Assessment & Plan:

## 2017-05-27 NOTE — Patient Instructions (Signed)
You had the flu shot today  Please take all new medication as prescribed - the remeron at night for mood and sleep  You will be contacted regarding the referral for: Counseling  Please continue all other medications as before, and refills have been done if requested.  Please have the pharmacy call with any other refills you may need.  Please continue your efforts at being more active, low cholesterol diet, and weight control.  You are otherwise up to date with prevention measures today.  Please keep your appointments with your specialists as you may have planned  Please go to the LAB in the Basement (turn left off the elevator) for the tests to be done today  You will be contacted by phone if any changes need to be made immediately.  Otherwise, you will receive a letter about your results with an explanation, but please check with MyChart first.  Please remember to sign up for MyChart if you have not done so, as this will be important to you in the future with finding out test results, communicating by private email, and scheduling acute appointments online when needed.  Please return in 1 year for your yearly visit, or sooner if needed, with Lab testing done 3-5 days before

## 2017-05-29 ENCOUNTER — Encounter: Payer: Self-pay | Admitting: Internal Medicine

## 2017-05-29 NOTE — Assessment & Plan Note (Signed)

## 2017-05-29 NOTE — Assessment & Plan Note (Signed)
Ok for remeron for depression and sleep, refer for counseling as well

## 2017-06-14 ENCOUNTER — Telehealth: Payer: Self-pay | Admitting: Internal Medicine

## 2017-06-14 NOTE — Telephone Encounter (Signed)
-----   Message from Merril Abbe, [place] sent at 06/14/2017 11:16 AM EST ----- FYI:  Your patient is scheduled to see Dr. Rexene Edison on 07/27/17. Pt is aware.  Thank you for the referral.

## 2017-06-23 ENCOUNTER — Other Ambulatory Visit: Payer: Self-pay | Admitting: Otolaryngology

## 2017-06-29 ENCOUNTER — Encounter: Payer: BC Managed Care – PPO | Admitting: Women's Health

## 2017-06-29 DIAGNOSIS — Z0289 Encounter for other administrative examinations: Secondary | ICD-10-CM

## 2017-07-08 ENCOUNTER — Telehealth: Payer: Self-pay | Admitting: Internal Medicine

## 2017-07-08 MED ORDER — CLONAZEPAM 0.5 MG PO TABS: 0.5000 mg | ORAL_TABLET | Freq: Two times a day (BID) | ORAL | 1 refills | Status: DC | PRN

## 2017-07-08 MED ORDER — SERTRALINE HCL 100 MG PO TABS: 100.0000 mg | ORAL_TABLET | Freq: Every day | ORAL | 3 refills | Status: DC

## 2017-07-08 NOTE — Telephone Encounter (Signed)
Notified pt of MD response. Pt is also wanting to know about the Klonopin rx. Should she go back taking that one as well. If so will need rx sent to pharmacy...Johny Chess

## 2017-07-08 NOTE — Telephone Encounter (Signed)
Notified pt both rx has been sent...[name]

## 2017-07-08 NOTE — Telephone Encounter (Signed)
Pt asking to be put back on Zoloft due to Mirtazapine no longer working.

## 2017-07-08 NOTE — Telephone Encounter (Signed)
Done erx 

## 2017-07-08 NOTE — Telephone Encounter (Signed)
Copied from [place] [phone]. Topic: Quick Communication - See Telephone Encounter >> Jul 08, 2017 10:00 AM Corie Chiquito, NT wrote: CRM for notification. See Telephone encounter for: Patient calling because she stated that the Mirtazapine isn't working any more for her and she would like to be put back on the Zoloft. If someone could give her a call back about this at [phone]  07/08/17.

## 2017-07-08 NOTE — Telephone Encounter (Signed)
Ok for change - done erx 

## 2017-07-27 ENCOUNTER — Ambulatory Visit: Payer: BC Managed Care – PPO | Admitting: Psychology

## 2017-08-19 IMAGING — MG MM SCREENING BREAST TOMO BILATERAL
4 series · 4 of 16 positions shown · non-contrast
Comparison: Previous exam(s).

CLINICAL DATA: Screening.

EXAM:
2D DIGITAL SCREENING BILATERAL MAMMOGRAM WITH CAD AND ADJUNCT TOMO

[R CC synth-2D]
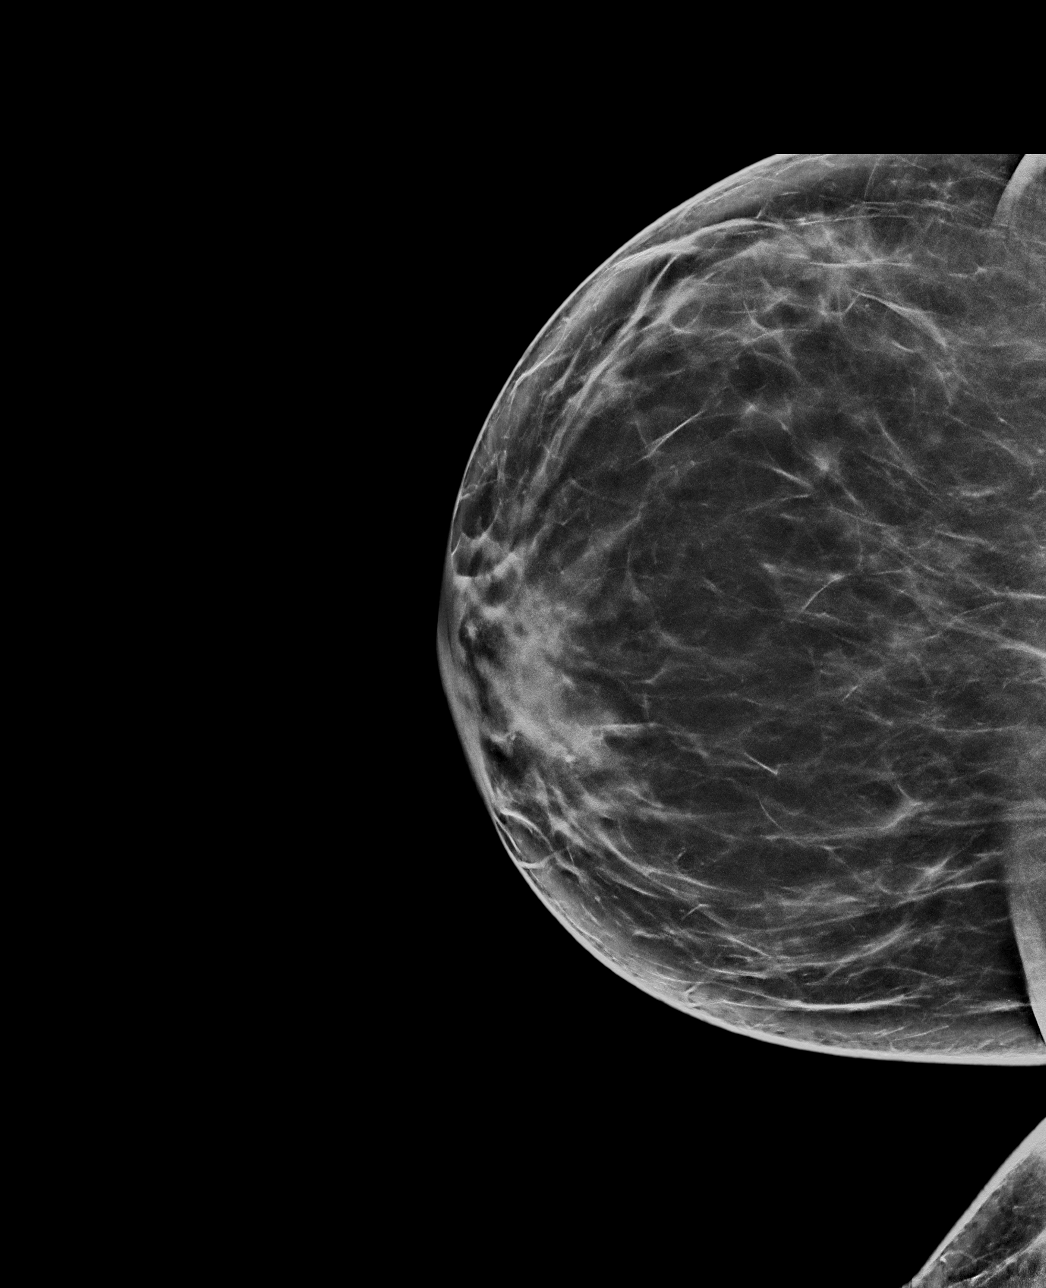

[R CC tomo · tomo slice 40/79.0]
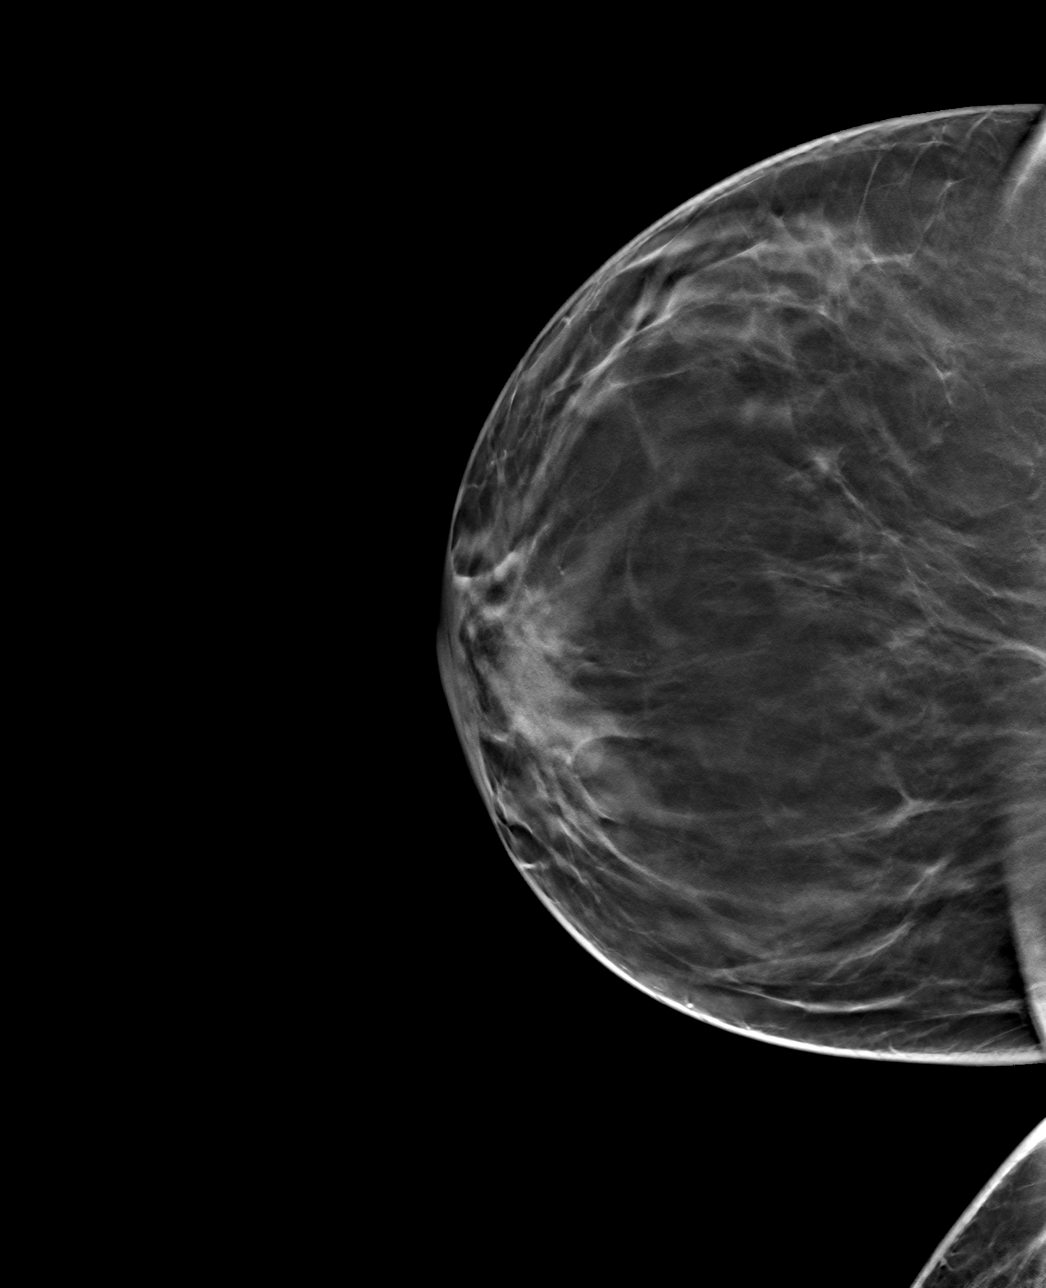

[L CC tomo · tomo slice 41/80.0]
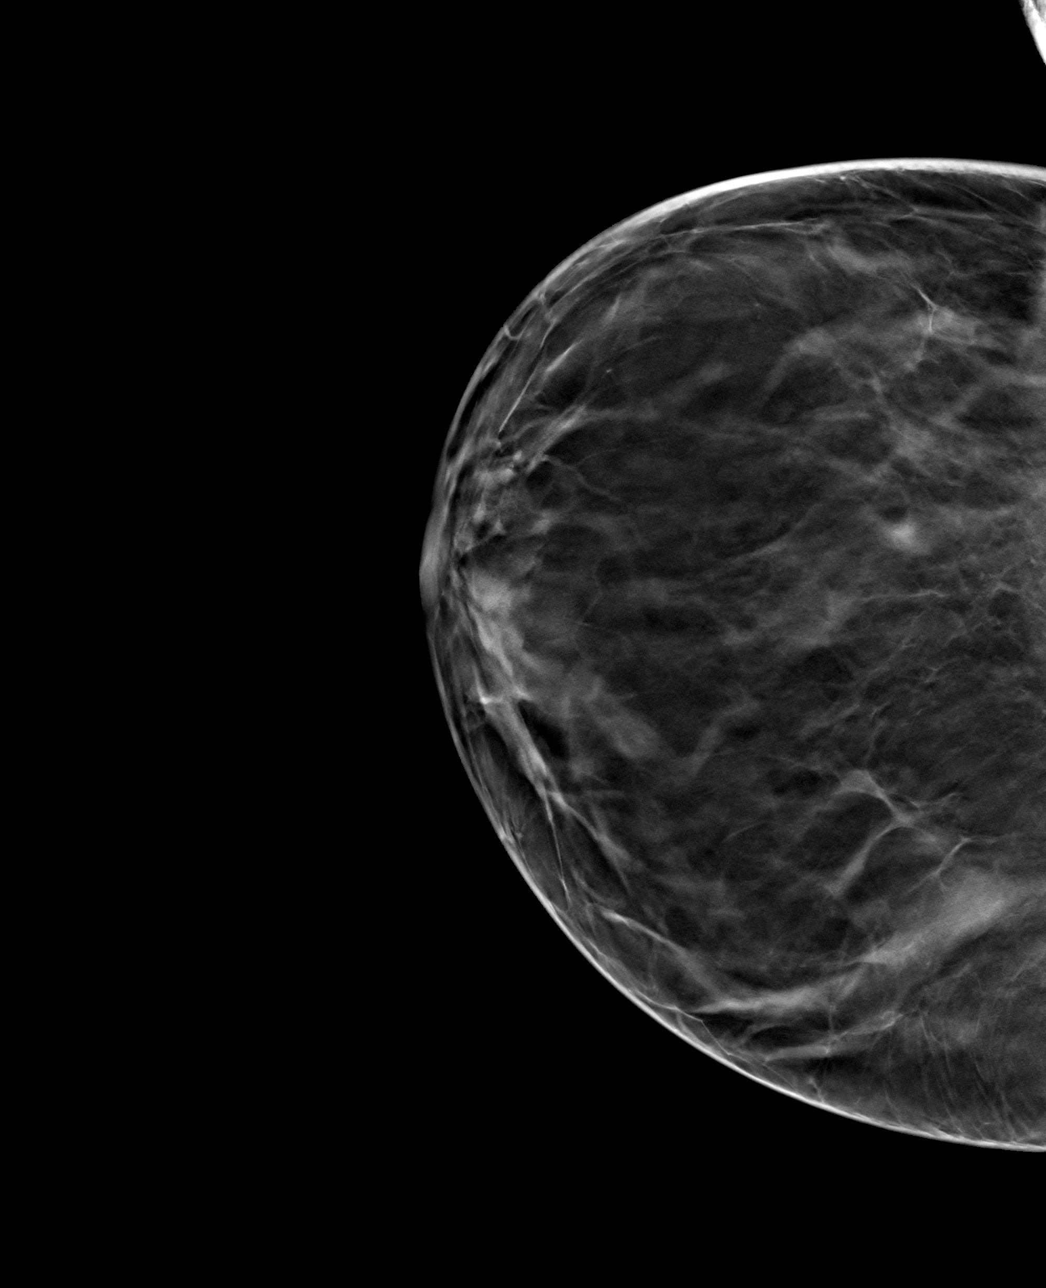

[L MLO tomo · tomo slice 46/91.0]
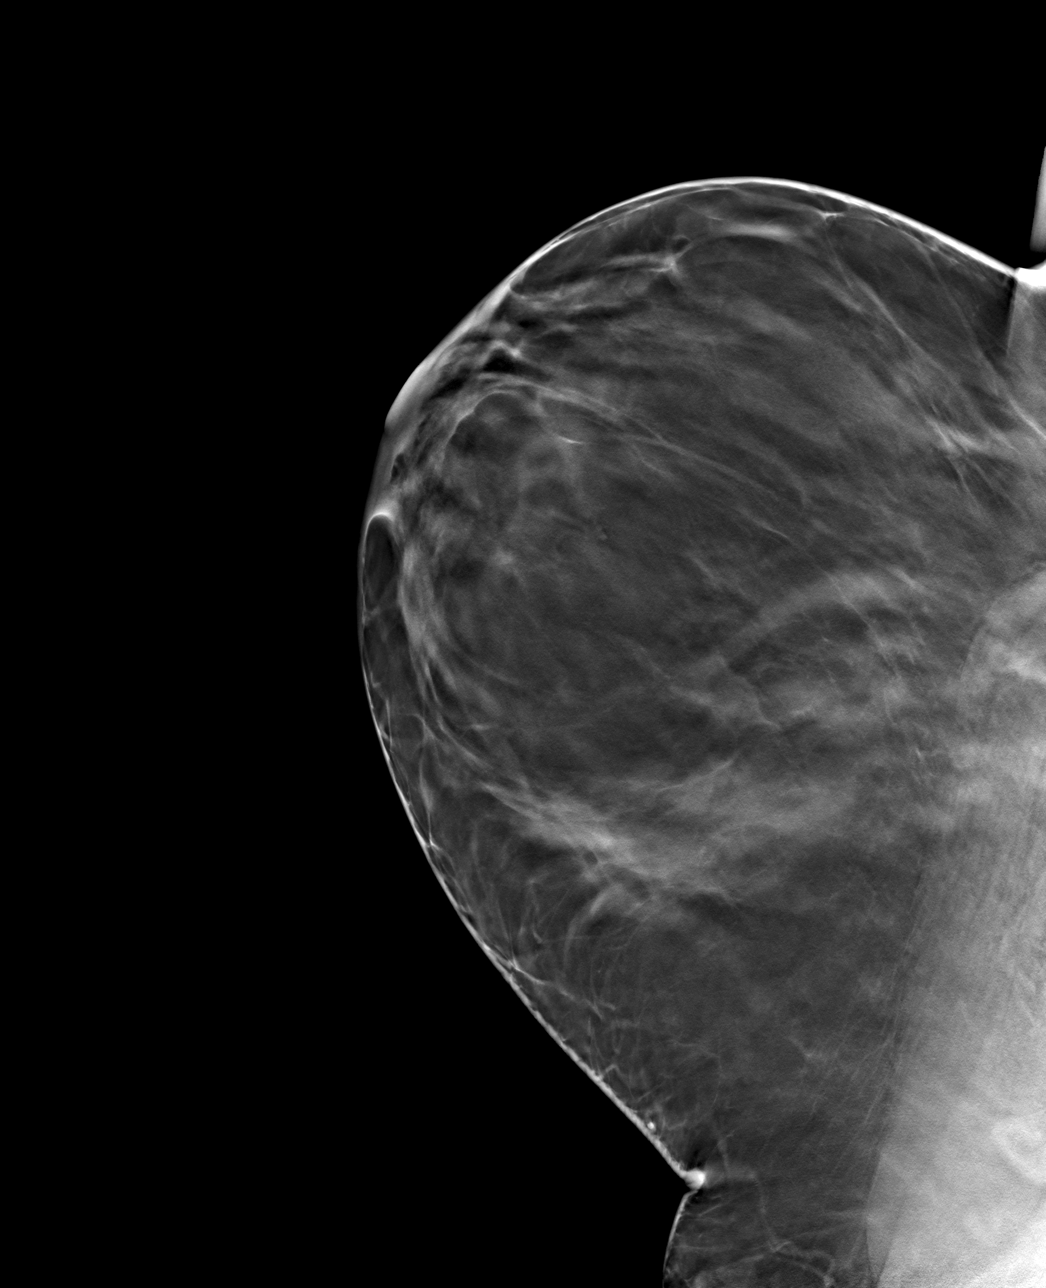

[4 of 16 positions shown; findings below may reference images not displayed]

ACR Breast Density Category b: There are scattered areas of
fibroglandular density.
FINDINGS: There are no findings suspicious for malignancy. Images were
processed with CAD.
IMPRESSION: No mammographic evidence of malignancy. A result letter of this
screening mammogram will be mailed directly to the patient.

RECOMMENDATION:
Screening mammogram in one year. (Code:97-6-RS4)

BI-RADS CATEGORY  1: Negative.

## 2017-09-05 ENCOUNTER — Ambulatory Visit: Payer: BC Managed Care – PPO | Admitting: Family

## 2017-09-05 ENCOUNTER — Encounter: Payer: Self-pay | Admitting: Family

## 2017-09-05 VITALS — BP 148/86 | HR 82 | Temp 98.4°F | Ht 61.0 in | Wt 177.0 lb

## 2017-09-05 DIAGNOSIS — J069 Acute upper respiratory infection, unspecified: Secondary | ICD-10-CM

## 2017-09-05 NOTE — Progress Notes (Signed)
[name] is a 52 y.o. female with the following history as recorded in EpicCare:  Patient Active Problem List   Diagnosis Date Noted  . Dyspnea 03/24/2017  . Nausea 03/24/2017  . Sinusitis, chronic 05/25/2016  . Bad breath 05/25/2016  . Fatigue 09/22/2015  . Abnormal TSH 05/09/2015  . Other and unspecified ovarian cyst 05/30/2013  . Chronic constipation 04/17/2013  . BV (bacterial vaginosis) 10/25/2012  . Preventative health care 10/12/2010  . HYPERSOMNIA 07/16/2008  . Hyperlipidemia 03/26/2007  . Anxiety state 03/26/2007  . Depression 03/26/2007  . Essential hypertension 03/26/2007  . Allergic rhinitis 03/26/2007  . GERD 03/26/2007  . IBS 03/26/2007  . COLONIC POLYPS, HX OF 03/26/2007    Current Outpatient Medications  Medication Sig Dispense Refill  . acetaminophen (TYLENOL) 500 MG tablet Take 1,000 mg by mouth daily as needed (cramping).    Marland Kitchen albuterol (PROVENTIL HFA;VENTOLIN HFA) 108 (90 BASE) MCG/ACT inhaler Inhale 2 puffs into the lungs every 6 (six) hours as needed. Shortness of breath 1 Inhaler 5  . amLODipine (NORVASC) 5 MG tablet Take 1 tablet (5 mg total) daily by mouth. 90 tablet 3  . clonazePAM (KLONOPIN) 0.5 MG tablet Take 1 tablet (0.5 mg total) by mouth 2 (two) times daily as needed for anxiety. 60 tablet 1  . fexofenadine-pseudoephedrine (ALLEGRA-D ALLERGY & CONGESTION) 180-240 MG 24 hr tablet Take 1 tablet by mouth daily. 30 tablet 1  . hydrochlorothiazide (MICROZIDE) 12.5 MG capsule Take 1 capsule (12.5 mg total) daily by mouth. 90 capsule 3  . linaclotide (LINZESS) 145 MCG CAPS capsule Take 1 capsule (145 mcg total) daily by mouth. 30 capsule 11  . losartan (COZAAR) 100 MG tablet Take 1 tablet (100 mg total) by mouth daily. 90 tablet 3  . ondansetron (ZOFRAN) 4 MG tablet Take 1 tablet (4 mg total) by mouth every 8 (eight) hours as needed for nausea or vomiting. 20 tablet 0  . pantoprazole (PROTONIX) 40 MG tablet Take 1 tablet (40 mg total) by mouth daily.  (Patient taking differently: Take 40 mg by mouth daily as needed. ) 90 tablet 3  . sertraline (ZOLOFT) 100 MG tablet Take 1 tablet (100 mg total) by mouth daily. 90 tablet 3   No current facility-administered medications for this visit.     Allergies: Other and Sulfur  Past Medical History:  Diagnosis Date  . Allergic rhinitis   . Allergy   . Depression   . Hyperplastic colon polyp 10/2003  . Menorrhagia   . Obesity   . PONV (postoperative nausea and vomiting)   . RLS (restless legs syndrome)     Past Surgical History:  Procedure Laterality Date  . COLONOSCOPY    . CYSTOSCOPY N/A 10/12/2016   Procedure: CYSTOSCOPY;  Surgeon: Anastasio Auerbach, MD;  Location: [facility];  Service: Gynecology;  Laterality: N/A;  . ENDOMETRIAL ABLATION  2007   Dr. Landry Mellow at [facility]  . FOOT SURGERY    . LAPAROSCOPIC VAGINAL HYSTERECTOMY WITH SALPINGECTOMY Bilateral 10/12/2016   Procedure: LAPAROSCOPIC ASSISTED VAGINAL HYSTERECTOMY WITH SALPINGECTOMY;  Surgeon: Anastasio Auerbach, MD;  Location: [facility];  Service: Gynecology;  Laterality: Bilateral;  request to follow first case at around 10:30am.  Requests 2 1/2 hours for the case.  Marland Kitchen SALIVARY GLAND SURGERY  12-11   LEFT SIDE NODULE REMOVED WAS BENIGN  . TUBAL LIGATION      Family History  Problem Relation Age of Onset  . Cancer Mother        colon  cancer at 30 yo  . Colon polyps Mother   . Hyperlipidemia Other   . Hypertension Other   . Heart attack Maternal Grandfather   . Esophageal cancer Neg Hx   . Rectal cancer Neg Hx   . Stomach cancer Neg Hx     Social History   Tobacco Use  . Smoking status: Never Smoker  . Smokeless tobacco: Never Used  Substance Use Topics  . Alcohol use: Yes    Alcohol/week: 0.0 oz    Comment: some    Subjective:  3 day history of cough/ congestion/ body aches/ chills; denies any sinus pain/ pressure or fever; had sinus surgery at the end of last year and feels she has done very well since the surgery; does  work as a Teacher, early years/pre; has current prescription for [facility]- using occasionally; took [place] on Saturday evening- no OTC medication since that time; denies any chest pain or shortness of breath;    Objective:  Vitals:   09/05/17 1029  BP: (!) 148/86  Pulse: 82  Temp: 98.4 F (36.9 C)  TempSrc: Oral  SpO2: 99%  Weight: 177 lb (80.3 kg)  Height: 5\' 1"  (1.549 m)    General: Well developed, well nourished, in no acute distress  Skin : Warm and dry.  Head: Normocephalic and atraumatic  Eyes: Sclera and conjunctiva clear; pupils round and reactive to light; extraocular movements intact  Ears: External normal; canals clear; tympanic membranes normal  Oropharynx: Pink, supple. No suspicious lesions  Neck: Supple without thyromegaly, adenopathy  Lungs: Respirations unlabored; clear to auscultation bilaterally without wheeze, rales, rhonchi  CVS exam: normal rate and regular rhythm.  Vessels: Symmetric bilaterally  Neurologic: Alert and oriented; speech intact; face symmetrical; moves all extremities well; CNII-XII intact without focal deficit   Assessment:  1. Acute URI     Plan:  Reassurance; suspect viral infection; symptomatic treatment discussed; she will use OTC Allegra D and Flonase; increase fluids, rest and follow-up worse, no better; work note given for today;   No Follow-up on file.  No orders of the defined types were placed in this encounter.   Requested Prescriptions    No prescriptions requested or ordered in this encounter

## 2017-09-05 NOTE — Patient Instructions (Signed)

## 2017-10-27 ENCOUNTER — Other Ambulatory Visit: Payer: Self-pay | Admitting: Internal Medicine

## 2017-10-27 DIAGNOSIS — Z139 Encounter for screening, unspecified: Secondary | ICD-10-CM

## 2017-10-31 ENCOUNTER — Ambulatory Visit: Payer: BC Managed Care – PPO | Admitting: Internal Medicine

## 2017-10-31 ENCOUNTER — Encounter: Payer: Self-pay | Admitting: Internal Medicine

## 2017-10-31 ENCOUNTER — Other Ambulatory Visit (INDEPENDENT_AMBULATORY_CARE_PROVIDER_SITE_OTHER): Payer: BC Managed Care – PPO

## 2017-10-31 VITALS — BP 130/86 | HR 88 | Temp 98.5°F | Ht 61.0 in | Wt 178.0 lb

## 2017-10-31 DIAGNOSIS — J329 Chronic sinusitis, unspecified: Secondary | ICD-10-CM

## 2017-10-31 DIAGNOSIS — F411 Generalized anxiety disorder: Secondary | ICD-10-CM | POA: Diagnosis not present

## 2017-10-31 DIAGNOSIS — F329 Major depressive disorder, single episode, unspecified: Secondary | ICD-10-CM

## 2017-10-31 DIAGNOSIS — R1013 Epigastric pain: Secondary | ICD-10-CM

## 2017-10-31 DIAGNOSIS — F32A Depression, unspecified: Secondary | ICD-10-CM

## 2017-10-31 LAB — BASIC METABOLIC PANEL
BUN: 9 mg/dL (ref 6–23)
CALCIUM: 9.4 mg/dL (ref 8.4–10.5)
CO2: 25 mEq/L (ref 19–32)
Chloride: 103 mEq/L (ref 96–112)
Creatinine, Ser: 0.68 mg/dL (ref 0.40–1.20)
GFR: 118.1 mL/min (ref >60.00)
GLUCOSE: 89 mg/dL (ref 70–99)
Potassium: 3.3 mEq/L — ABNORMAL LOW (ref 3.5–5.1)
SODIUM: 138 meq/L (ref 135–145)

## 2017-10-31 LAB — URINALYSIS, ROUTINE W REFLEX MICROSCOPIC
Bilirubin Urine: NEGATIVE
Hgb urine dipstick: NEGATIVE
Ketones, ur: NEGATIVE
Nitrite: NEGATIVE
PH: 6.5 (ref 5.0–8.0)
RBC / HPF: NONE SEEN (ref 0–?)
SPECIFIC GRAVITY, URINE: 1.02 (ref 1.000–1.030)
Total Protein, Urine: NEGATIVE
Urine Glucose: NEGATIVE
Urobilinogen, UA: 0.2 (ref 0.0–1.0)

## 2017-10-31 LAB — CBC WITH DIFFERENTIAL/PLATELET
Basophils Absolute: 0.1 10*3/uL (ref 0.0–0.1)
Basophils Relative: 0.7 % (ref 0.0–3.0)
EOS PCT: 0.5 % (ref 0.0–5.0)
Eosinophils Absolute: 0 10*3/uL (ref 0.0–0.7)
HEMATOCRIT: 39.4 % (ref 36.0–46.0)
HEMOGLOBIN: 13.1 g/dL (ref 12.0–15.0)
LYMPHS PCT: 23.3 % (ref 12.0–46.0)
Lymphs Abs: 1.9 10*3/uL (ref 0.7–4.0)
MCHC: 33.1 g/dL (ref 30.0–36.0)
MCV: 91.2 fl (ref 78.0–100.0)
MONO ABS: 0.7 10*3/uL (ref 0.1–1.0)
MONOS PCT: 8.8 % (ref 3.0–12.0)
Neutro Abs: 5.4 10*3/uL (ref 1.4–7.7)
Neutrophils Relative %: 66.7 % (ref 43.0–77.0)
Platelets: 223 10*3/uL (ref 150.0–400.0)
RBC: 4.32 Mil/uL (ref 3.87–5.11)
RDW: 15.2 % (ref 11.5–15.5)
WBC: 8.1 10*3/uL (ref 4.0–10.5)

## 2017-10-31 LAB — LIPASE: LIPASE: 20 U/L (ref 11.0–59.0)

## 2017-10-31 LAB — HEPATIC FUNCTION PANEL
ALT: 4 U/L (ref 0–35)
AST: 11 U/L (ref 0–37)
Albumin: 4.3 g/dL (ref 3.5–5.2)
Alkaline Phosphatase: 59 U/L (ref 39–117)
BILIRUBIN TOTAL: 0.6 mg/dL (ref 0.2–1.2)
Bilirubin, Direct: 0.1 mg/dL (ref 0.0–0.3)
TOTAL PROTEIN: 7.8 g/dL (ref 6.0–8.3)

## 2017-10-31 MED ORDER — PANTOPRAZOLE SODIUM 40 MG PO TBEC: 40.0000 mg | DELAYED_RELEASE_TABLET | Freq: Two times a day (BID) | ORAL | 3 refills | Status: DC

## 2017-10-31 MED ORDER — SERTRALINE HCL 100 MG PO TABS: ORAL_TABLET | ORAL | 3 refills | Status: DC

## 2017-10-31 NOTE — Assessment & Plan Note (Signed)
Mild situational but for increased zoloft 150 qd

## 2017-10-31 NOTE — Assessment & Plan Note (Signed)
No SI or HI, cont to follow

## 2017-10-31 NOTE — Assessment & Plan Note (Signed)
Etiology unclear, for increased PPI to bid, avoid nsaids and ETOH, for lower fat diet, abd u/s r/o GB, and labs as ordered

## 2017-10-31 NOTE — Patient Instructions (Signed)
Ok to increase the Protonix to twice per day  OK to increase the zoloft to 1 and 1/2 tab per day  Please avoid alcohol, anti-inflammatories  Please follow lower fat/lower chol diet  Please continue all other medications as before, and refills have been done if requested.  Please have the pharmacy call with any other refills you may need.  Please keep your appointments with your specialists as you may have planned  You will be contacted regarding the referral for: Abdomen ultrasound  Please go to the LAB in the Basement (turn left off the elevator) for the tests to be done today  You will be contacted by phone if any changes need to be made immediately.  Otherwise, you will receive a letter about your results with an explanation, but please check with MyChart first.  Please remember to sign up for MyChart if you have not done so, as this will be important to you in the future with finding out test results, communicating by private email, and scheduling acute appointments online when needed.

## 2017-10-31 NOTE — Assessment & Plan Note (Signed)
With halitiosis mild, for restart med tx

## 2017-10-31 NOTE — Progress Notes (Signed)
Subjective:    Patient ID: [name], female    DOB: 06-Nov-1965, 52 y.o.   MRN: [id number]  HPI  Here to f/u, admits to incresaed stress recently, moved back in with parents to save money for buying a house, son now in group home, and step mother has colon cancer (and biological mother died with colonca before); admits to increased ETOH use, now with c/o 1 mo worsening dull pressure pain unlike her usual reflux, has some nauesa, no vomiting, Denies dysphagia, bowel change or blood except for ongoing constapation on linzess and miralax per GI, works fairly well at about qod dosing.   Pt denies fever, wt loss, night sweats, loss of appetite, or other constitutional symptoms Wt Readings from Last 3 Encounters:  10/31/17 178 lb (80.7 kg)  09/05/17 177 lb (80.3 kg)  05/27/17 181 lb (82.1 kg)  States she is not ETOH, but will drink a few times on the weekends, none during the weel.  Has been holding off otc nsaids due to pain but not improving, and good compliance with daily PPI.  Has not paid attention to diet exactly, but certain foods will make it worse.   No pain raidation.  Still has GB intact.    Past Medical History:  Diagnosis Date  . Allergic rhinitis   . Allergy   . Depression   . Hyperplastic colon polyp 10/2003  . Menorrhagia   . Obesity   . PONV (postoperative nausea and vomiting)   . RLS (restless legs syndrome)    Past Surgical History:  Procedure Laterality Date  . COLONOSCOPY    . CYSTOSCOPY N/A 10/12/2016   Procedure: CYSTOSCOPY;  Surgeon: Anastasio Auerbach, MD;  Location: [facility];  Service: Gynecology;  Laterality: N/A;  . ENDOMETRIAL ABLATION  2007   Dr. Landry Mellow at [facility]  . FOOT SURGERY    . LAPAROSCOPIC VAGINAL HYSTERECTOMY WITH SALPINGECTOMY Bilateral 10/12/2016   Procedure: LAPAROSCOPIC ASSISTED VAGINAL HYSTERECTOMY WITH SALPINGECTOMY;  Surgeon: Anastasio Auerbach, MD;  Location: [facility];  Service: Gynecology;  Laterality: Bilateral;  request to follow first case at  around 10:30am.  Requests 2 1/2 hours for the case.  Marland Kitchen SALIVARY GLAND SURGERY  12-11   LEFT SIDE NODULE REMOVED WAS BENIGN  . TUBAL LIGATION      reports that she has never smoked. She has never used smokeless tobacco. She reports that she drinks alcohol. She reports that she does not use drugs. family history includes Cancer in her mother; Colon polyps in her mother; Heart attack in her maternal grandfather; Hyperlipidemia in her other; Hypertension in her other. Allergies  Allergen Reactions  . Other     POLLEN AND DUST  . Sulfur Hives   ] Current Outpatient Medications on File Prior to Visit  Medication Sig Dispense Refill  . acetaminophen (TYLENOL) 500 MG tablet Take 1,000 mg by mouth daily as needed (cramping).    Marland Kitchen albuterol (PROVENTIL HFA;VENTOLIN HFA) 108 (90 BASE) MCG/ACT inhaler Inhale 2 puffs into the lungs every 6 (six) hours as needed. Shortness of breath 1 Inhaler 5  . amLODipine (NORVASC) 5 MG tablet Take 1 tablet (5 mg total) daily by mouth. 90 tablet 3  . clonazePAM (KLONOPIN) 0.5 MG tablet Take 1 tablet (0.5 mg total) by mouth 2 (two) times daily as needed for anxiety. 60 tablet 1  . hydrochlorothiazide (MICROZIDE) 12.5 MG capsule Take 1 capsule (12.5 mg total) daily by mouth. 90 capsule 3  . linaclotide (LINZESS) 145 MCG CAPS  capsule Take 1 capsule (145 mcg total) daily by mouth. 30 capsule 11  . losartan (COZAAR) 100 MG tablet Take 1 tablet (100 mg total) by mouth daily. 90 tablet 3  . [DISCONTINUED] fluticasone (FLONASE) 50 MCG/ACT nasal spray 2 sprays by Nasal route daily. 16 g 2   No current facility-administered medications on file prior to visit.    Review of Systems  Constitutional: Negative for other unusual diaphoresis or sweats HENT: Negative for ear discharge or swelling Eyes: Negative for other worsening visual disturbances Respiratory: Negative for stridor or other swelling  Gastrointestinal: Negative for worsening distension or other  blood Genitourinary: Negative for retention or other urinary change Musculoskeletal: Negative for other MSK pain or swelling Skin: Negative for color change or other new lesions Neurological: Negative for worsening tremors and other numbness  Psychiatric/Behavioral: Negative for worsening agitation or other fatigue All other system neg per pt    Objective:   Physical Exam BP 130/86   Pulse 88   Temp 98.5 F (36.9 C) (Oral)   Ht 5\' 1"  (1.549 m)   Wt 178 lb (80.7 kg)   LMP 10/01/2016   SpO2 99%   BMI 33.63 kg/m  VS noted, non toxic Constitutional: Pt appears in NAD HENT: Head: NCAT.  Right Ear: External ear normal.  Left Ear: External ear normal.  Eyes: . Pupils are equal, round, and reactive to light. Conjunctivae and EOM are normal Nose: without d/c or deformity Neck: Neck supple. Gross normal ROM Cardiovascular: Normal rate and regular rhythm.   Pulmonary/Chest: Effort normal and breath sounds without rales or wheezing.  Abd:  Soft, ND, + BS, no organomegaly with mild tender epigastrium Neurological: Pt is alert. At baseline orientation, motor grossly intact Skin: Skin is warm. No rashes, other new lesions, no LE edema Psychiatric: Pt behavior is normal without agitation , 1+ nervous No other exam findings   Assessment & Plan:

## 2017-11-14 ENCOUNTER — Encounter: Payer: Self-pay | Admitting: Internal Medicine

## 2017-11-17 ENCOUNTER — Ambulatory Visit
Admission: RE | Admit: 2017-11-17 | Discharge: 2017-11-17 | Disposition: A | Discharge status: Home / Self Care | Payer: BC Managed Care – PPO | Source: Ambulatory Visit | Attending: Internal Medicine | Admitting: Internal Medicine

## 2017-11-17 DIAGNOSIS — R1013 Epigastric pain: Secondary | ICD-10-CM

## 2017-12-14 ENCOUNTER — Ambulatory Visit: Payer: BC Managed Care – PPO | Admitting: Women's Health

## 2017-12-14 ENCOUNTER — Encounter: Payer: Self-pay | Admitting: Women's Health

## 2017-12-14 VITALS — BP 140/82 | Ht 61.0 in | Wt 173.0 lb

## 2017-12-14 DIAGNOSIS — Z01419 Encounter for gynecological examination (general) (routine) without abnormal findings: Secondary | ICD-10-CM | POA: Diagnosis not present

## 2017-12-14 DIAGNOSIS — R300 Dysuria: Secondary | ICD-10-CM

## 2017-12-14 DIAGNOSIS — N898 Other specified noninflammatory disorders of vagina: Secondary | ICD-10-CM

## 2017-12-14 DIAGNOSIS — Z113 Encounter for screening for infections with a predominantly sexual mode of transmission: Secondary | ICD-10-CM | POA: Diagnosis not present

## 2017-12-14 LAB — WET PREP FOR TRICH, YEAST, CLUE

## 2017-12-14 MED ORDER — FLUCONAZOLE 150 MG PO TABS: 150.0000 mg | ORAL_TABLET | Freq: Once | ORAL | 1 refills | Status: AC

## 2017-12-14 NOTE — Progress Notes (Signed)
[name] 1965-08-25 [id number]    History:    Presents for annual exam.  Oct 19, 2016 LAV[facility] for fibroids with occasional hot flushes.  Normal Pap and mammogram history.  10-19-08 benign colon polyp, October 19, 2013- colonoscopy.  Mother died from colon cancer at age 50.  Primary care manages labs and meds for hypertension, GERD, IBS.  New partner.  Past medical history, past surgical history, family history and social history were all reviewed and documented in the EPIC chart.  Driver, finished college in 10/19/2016.  Currently working on saving money to buy a home.  2 sons one with schizophrenia living in a group home doing better.  One son healthy lives in [place].  ROS:  A ROS was performed and pertinent positives and negatives are included.  Exam:  Vitals:   12/14/17 1100  BP: 140/82  Weight: 173 lb (78.5 kg)  Height: 5\' 1"  (1.549 m)   Body mass index is 32.69 kg/m.   General appearance:  Normal Thyroid:  Symmetrical, normal in size, without palpable masses or nodularity. Respiratory  Auscultation:  Clear without wheezing or rhonchi Cardiovascular  Auscultation:  Regular rate, without rubs, murmurs or gallops  Edema/varicosities:  Not grossly evident Abdominal  Soft,nontender, without masses, guarding or rebound.  Liver/spleen:  No organomegaly noted  Hernia:  None appreciated  Skin  Inspection:  Grossly normal   Breasts: Examined lying and sitting.     Right: Without masses, retractions, discharge or axillary adenopathy.     Left: Without masses, retractions, discharge or axillary adenopathy. Gentitourinary   Inguinal/mons:  Normal without inguinal adenopathy  External genitalia:  Normal  BUS/Urethra/Skene's glands:  Normal  Vagina: Moderate white discharge, wet prep positive for yeast  cervix: And uterus absent  Adnexa/parametria:     Rt: Without masses or tenderness.   Lt: Without masses or tenderness.  Anus and perineum: Normal  Digital rectal exam: Normal sphincter tone without  palpated masses or tenderness  Assessment/Plan:  52 y.o. SBF G2, P2 for annual exam with occasional hot flushes and urinary frequency.  10/19/16 LAVH for fibroids Yeast vaginitis Hypertension, GERD, IBS, anxiety depression-primary care manages labs and meds Obesity STD screen  Plan: GC/chlamydia, HIV, hep B, C, RPR.  Condoms encouraged until permanent partner.  Diflucan 150 p.o. x1 dose prescription, proper use given and reviewed.  Instructed to call if no relief of discharge.  Reviewed UA results -  trace leukocytes, 10-20 WBCs, moderate bacteria, urine culture pending.  SBE's, continue annual screening mammogram has scheduled next month.  Encouraged to increase exercise and decreasing calorie/carbs.  Reviewed importance of leisure activities.  Menopause reviewed, denies need for any medication at this time tolerating well.    Huel Cote [facility], 12:40 PM 12/14/2017

## 2017-12-14 NOTE — Progress Notes (Signed)
wet 

## 2017-12-14 NOTE — Patient Instructions (Signed)
Health Maintenance for Postmenopausal Women Menopause is a normal process in which your reproductive ability comes to an end. This process happens gradually over a span of months to years, usually between the ages of 22 and 9. Menopause is complete when you have missed 12 consecutive menstrual periods. It is important to talk with your health care provider about some of the most common conditions that affect postmenopausal women, such as heart disease, cancer, and bone loss (osteoporosis). Adopting a healthy lifestyle and getting preventive care can help to promote your health and wellness. Those actions can also lower your chances of developing some of these common conditions. What should I know about menopause? During menopause, you may experience a number of symptoms, such as:  Moderate-to-severe hot flashes.  Night sweats.  Decrease in sex drive.  Mood swings.  Headaches.  Tiredness.  Irritability.  Memory problems.  Insomnia.  Choosing to treat or not to treat menopausal changes is an individual decision that you make with your health care provider. What should I know about hormone replacement therapy and supplements? Hormone therapy products are effective for treating symptoms that are associated with menopause, such as hot flashes and night sweats. Hormone replacement carries certain risks, especially as you become older. If you are thinking about using estrogen or estrogen with progestin treatments, discuss the benefits and risks with your health care provider. What should I know about heart disease and stroke? Heart disease, heart attack, and stroke become more likely as you age. This may be due, in part, to the hormonal changes that your body experiences during menopause. These can affect how your body processes dietary fats, triglycerides, and cholesterol. Heart attack and stroke are both medical emergencies. There are many things that you can do to help prevent heart disease  and stroke:  Have your blood pressure checked at least every 1-2 years. High blood pressure causes heart disease and increases the risk of stroke.  If you are 53-22 years old, ask your health care provider if you should take aspirin to prevent a heart attack or a stroke.  Do not use any tobacco products, including cigarettes, chewing tobacco, or electronic cigarettes. If you need help quitting, ask your health care provider.  It is important to eat a healthy diet and maintain a healthy weight. ? Be sure to include plenty of vegetables, fruits, low-fat dairy products, and lean protein. ? Avoid eating foods that are high in solid fats, added sugars, or salt (sodium).  Get regular exercise. This is one of the most important things that you can do for your health. ? Try to exercise for at least 150 minutes each week. The type of exercise that you do should increase your heart rate and make you sweat. This is known as moderate-intensity exercise. ? Try to do strengthening exercises at least twice each week. Do these in addition to the moderate-intensity exercise.  Know your numbers.Ask your health care provider to check your cholesterol and your blood glucose. Continue to have your blood tested as directed by your health care provider.  What should I know about cancer screening? There are several types of cancer. Take the following steps to reduce your risk and to catch any cancer development as early as possible. Breast Cancer  Practice breast self-awareness. ? This means understanding how your breasts normally appear and feel. ? It also means doing regular breast self-exams. Let your health care provider know about any changes, no matter how small.  If you are 40  or older, have a clinician do a breast exam (clinical breast exam or CBE) every year. Depending on your age, family history, and medical history, it may be recommended that you also have a yearly breast X-ray (mammogram).  If you  have a family history of breast cancer, talk with your health care provider about genetic screening.  If you are at high risk for breast cancer, talk with your health care provider about having an MRI and a mammogram every year.  Breast cancer (BRCA) gene test is recommended for women who have family members with BRCA-related cancers. Results of the assessment will determine the need for genetic counseling and BRCA1 and for BRCA2 testing. BRCA-related cancers include these types: ? Breast. This occurs in males or females. ? Ovarian. ? Tubal. This may also be called fallopian tube cancer. ? Cancer of the abdominal or pelvic lining (peritoneal cancer). ? Prostate. ? Pancreatic.  Cervical, Uterine, and Ovarian Cancer Your health care provider may recommend that you be screened regularly for cancer of the pelvic organs. These include your ovaries, uterus, and vagina. This screening involves a pelvic exam, which includes checking for microscopic changes to the surface of your cervix (Pap test).  For women ages 21-65, health care providers may recommend a pelvic exam and a Pap test every three years. For women ages 79-65, they may recommend the Pap test and pelvic exam, combined with testing for human papilloma virus (HPV), every five years. Some types of HPV increase your risk of cervical cancer. Testing for HPV may also be done on women of any age who have unclear Pap test results.  Other health care providers may not recommend any screening for nonpregnant women who are considered low risk for pelvic cancer and have no symptoms. Ask your health care provider if a screening pelvic exam is right for you.  If you have had past treatment for cervical cancer or a condition that could lead to cancer, you need Pap tests and screening for cancer for at least 20 years after your treatment. If Pap tests have been discontinued for you, your risk factors (such as having a new sexual partner) need to be  reassessed to determine if you should start having screenings again. Some women have medical problems that increase the chance of getting cervical cancer. In these cases, your health care provider may recommend that you have screening and Pap tests more often.  If you have a family history of uterine cancer or ovarian cancer, talk with your health care provider about genetic screening.  If you have vaginal bleeding after reaching menopause, tell your health care provider.  There are currently no reliable tests available to screen for ovarian cancer.  Lung Cancer Lung cancer screening is recommended for adults 69-62 years old who are at high risk for lung cancer because of a history of smoking. A yearly low-dose CT scan of the lungs is recommended if you:  Currently smoke.  Have a history of at least 30 pack-years of smoking and you currently smoke or have quit within the past 15 years. A pack-year is smoking an average of one pack of cigarettes per day for one year.  Yearly screening should:  Continue until it has been 15 years since you quit.  Stop if you develop a health problem that would prevent you from having lung cancer treatment.  Colorectal Cancer  This type of cancer can be detected and can often be prevented.  Routine colorectal cancer screening usually begins at  age 42 and continues through age 45.  If you have risk factors for colon cancer, your health care provider may recommend that you be screened at an earlier age.  If you have a family history of colorectal cancer, talk with your health care provider about genetic screening.  Your health care provider may also recommend using home test kits to check for hidden blood in your stool.  A small camera at the end of a tube can be used to examine your colon directly (sigmoidoscopy or colonoscopy). This is done to check for the earliest forms of colorectal cancer.  Direct examination of the colon should be repeated every  5-10 years until age 71. However, if early forms of precancerous polyps or small growths are found or if you have a family history or genetic risk for colorectal cancer, you may need to be screened more often.  Skin Cancer  Check your skin from head to toe regularly.  Monitor any moles. Be sure to tell your health care provider: ? About any new moles or changes in moles, especially if there is a change in a mole's shape or color. ? If you have a mole that is larger than the size of a pencil eraser.  If any of your family members has a history of skin cancer, especially at a young age, talk with your health care provider about genetic screening.  Always use sunscreen. Apply sunscreen liberally and repeatedly throughout the day.  Whenever you are outside, protect yourself by wearing long sleeves, pants, a wide-brimmed hat, and sunglasses.  What should I know about osteoporosis? Osteoporosis is a condition in which bone destruction happens more quickly than new bone creation. After menopause, you may be at an increased risk for osteoporosis. To help prevent osteoporosis or the bone fractures that can happen because of osteoporosis, the following is recommended:  If you are 46-71 years old, get at least 1,000 mg of calcium and at least 600 mg of vitamin D per day.  If you are older than age 55 but younger than age 65, get at least 1,200 mg of calcium and at least 600 mg of vitamin D per day.  If you are older than age 54, get at least 1,200 mg of calcium and at least 800 mg of vitamin D per day.  Smoking and excessive alcohol intake increase the risk of osteoporosis. Eat foods that are rich in calcium and vitamin D, and do weight-bearing exercises several times each week as directed by your health care provider. What should I know about how menopause affects my mental health? Depression may occur at any age, but it is more common as you become older. Common symptoms of depression  include:  Low or sad mood.  Changes in sleep patterns.  Changes in appetite or eating patterns.  Feeling an overall lack of motivation or enjoyment of activities that you previously enjoyed.  Frequent crying spells.  Talk with your health care provider if you think that you are experiencing depression. What should I know about immunizations? It is important that you get and maintain your immunizations. These include:  Tetanus, diphtheria, and pertussis (Tdap) booster vaccine.  Influenza every year before the flu season begins.  Pneumonia vaccine.  Shingles vaccine.  Your health care provider may also recommend other immunizations. This information is not intended to replace advice given to you by your health care provider. Make sure you discuss any questions you have with your health care provider. Document Released: 08/27/2005  Document Revised: 01/23/2016 Document Reviewed: 04/08/2015 Elsevier Interactive Patient Education  2018 [employer] Carbohydrate Counting for Diabetes Mellitus, Adult Carbohydrate counting is a method for keeping track of how many carbohydrates you eat. Eating carbohydrates naturally increases the amount of sugar (glucose) in the blood. Counting how many carbohydrates you eat helps keep your blood glucose within normal limits, which helps you manage your diabetes (diabetes mellitus). It is important to know how many carbohydrates you can safely have in each meal. This is different for every person. A diet and nutrition specialist (registered dietitian) can help you make a meal plan and calculate how many carbohydrates you should have at each meal and snack. Carbohydrates are found in the following foods:  Grains, such as breads and cereals.  Dried beans and soy products.  Starchy vegetables, such as potatoes, peas, and corn.  Fruit and fruit juices.  Milk and yogurt.  Sweets and snack foods, such as cake, cookies, candy, chips, and soft  drinks.  How do I count carbohydrates? There are two ways to count carbohydrates in food. You can use either of the methods or a combination of both. Reading "Nutrition Facts" on packaged food The "Nutrition Facts" list is included on the labels of almost all packaged foods and beverages in the [place] It includes:  The serving size.  Information about nutrients in each serving, including the grams (g) of carbohydrate per serving.  To use the "Nutrition Facts":  Decide how many servings you will have.  Multiply the number of servings by the number of carbohydrates per serving.  The resulting number is the total amount of carbohydrates that you will be having.  Learning standard serving sizes of other foods When you eat foods containing carbohydrates that are not packaged or do not include "Nutrition Facts" on the label, you need to measure the servings in order to count the amount of carbohydrates:  Measure the foods that you will eat with a food scale or measuring cup, if needed.  Decide how many standard-size servings you will eat.  Multiply the number of servings by 15. Most carbohydrate-rich foods have about 15 g of carbohydrates per serving. ? For example, if you eat 8 oz (170 g) of strawberries, you will have eaten 2 servings and 30 g of carbohydrates (2 servings x 15 g = 30 g).  For foods that have more than one food mixed, such as soups and casseroles, you must count the carbohydrates in each food that is included.  The following list contains standard serving sizes of common carbohydrate-rich foods. Each of these servings has about 15 g of carbohydrates:   hamburger bun or  English muffin.   oz (15 mL) syrup.   oz (14 g) jelly.  1 slice of bread.  1 six-inch tortilla.  3 oz (85 g) cooked rice or pasta.  4 oz (113 g) cooked dried beans.  4 oz (113 g) starchy vegetable, such as peas, corn, or potatoes.  4 oz (113 g) hot cereal.  4 oz (113 g) mashed potatoes  or  of a large baked potato.  4 oz (113 g) canned or frozen fruit.  4 oz (120 mL) fruit juice.  4-6 crackers.  6 chicken nuggets.  6 oz (170 g) unsweetened dry cereal.  6 oz (170 g) plain fat-free yogurt or yogurt sweetened with artificial sweeteners.  8 oz (240 mL) milk.  8 oz (170 g) fresh fruit or one small piece of fruit.  24 oz (680 g) popped   popcorn.  Example of carbohydrate counting Sample meal  3 oz (85 g) chicken breast.  6 oz (170 g) brown rice.  4 oz (113 g) corn.  8 oz (240 mL) milk.  8 oz (170 g) strawberries with sugar-free whipped topping. Carbohydrate calculation 1. Identify the foods that contain carbohydrates: ? Rice. ? Corn. ? Milk. ? Strawberries. 2. Calculate how many servings you have of each food: ? 2 servings rice. ? 1 serving corn. ? 1 serving milk. ? 1 serving strawberries. 3. Multiply each number of servings by 15 g: ? 2 servings rice x 15 g = 30 g. ? 1 serving corn x 15 g = 15 g. ? 1 serving milk x 15 g = 15 g. ? 1 serving strawberries x 15 g = 15 g. 4. Add together all of the amounts to find the total grams of carbohydrates eaten: ? 30 g + 15 g + 15 g + 15 g = 75 g of carbohydrates total. This information is not intended to replace advice given to you by your health care provider. Make sure you discuss any questions you have with your health care provider. Document Released: 07/05/2005 Document Revised: 01/23/2016 Document Reviewed: 12/17/2015 Elsevier Interactive Patient Education  [employer].

## 2017-12-15 LAB — HEPATITIS B SURFACE ANTIGEN: Hepatitis B Surface Ag: NONREACTIVE

## 2017-12-15 LAB — C. TRACHOMATIS/N. GONORRHOEAE RNA
C. TRACHOMATIS RNA, TMA: NOT DETECTED
N. GONORRHOEAE RNA, TMA: NOT DETECTED

## 2017-12-15 LAB — HEPATITIS C ANTIBODY
Hepatitis C Ab: NONREACTIVE
SIGNAL TO CUT-OFF: 0.02 (ref <1.00)

## 2017-12-15 LAB — HIV ANTIBODY (ROUTINE TESTING W REFLEX): HIV 1&2 Ab, 4th Generation: NONREACTIVE

## 2017-12-15 LAB — RPR: RPR: NONREACTIVE

## 2017-12-16 LAB — URINALYSIS, COMPLETE W/RFL CULTURE
BILIRUBIN URINE: NEGATIVE
Glucose, UA: NEGATIVE
HYALINE CAST: NONE SEEN /LPF
Ketones, ur: NEGATIVE
Nitrites, Initial: NEGATIVE
PROTEIN: NEGATIVE
Specific Gravity, Urine: 1.025 (ref 1.001–1.03)
pH: 6 (ref 5.0–8.0)

## 2017-12-16 LAB — URINE CULTURE
MICRO NUMBER: 90651921
Result:: NO GROWTH
SPECIMEN QUALITY: ADEQUATE

## 2017-12-16 LAB — CULTURE INDICATED

## 2017-12-19 ENCOUNTER — Ambulatory Visit
Admission: RE | Admit: 2017-12-19 | Discharge: 2017-12-19 | Disposition: A | Discharge status: Home / Self Care | Payer: BC Managed Care – PPO | Source: Ambulatory Visit | Attending: Internal Medicine | Admitting: Internal Medicine

## 2017-12-19 DIAGNOSIS — Z139 Encounter for screening, unspecified: Secondary | ICD-10-CM

## 2017-12-20 ENCOUNTER — Encounter (INDEPENDENT_AMBULATORY_CARE_PROVIDER_SITE_OTHER): Payer: Self-pay

## 2017-12-21 ENCOUNTER — Other Ambulatory Visit: Payer: Self-pay | Admitting: Internal Medicine

## 2017-12-21 NOTE — Telephone Encounter (Signed)
Done erx 

## 2018-01-05 ENCOUNTER — Encounter: Payer: Self-pay | Admitting: Physician Assistant

## 2018-01-05 ENCOUNTER — Ambulatory Visit: Payer: BC Managed Care – PPO | Admitting: Physician Assistant

## 2018-01-05 ENCOUNTER — Other Ambulatory Visit: Payer: Self-pay

## 2018-01-05 VITALS — BP 122/80 | HR 90 | Temp 99.0°F | Resp 18 | Ht 62.21 in | Wt 170.6 lb

## 2018-01-05 DIAGNOSIS — Z024 Encounter for examination for driving license: Secondary | ICD-10-CM

## 2018-01-05 NOTE — Patient Instructions (Signed)
     IF you received an x-ray today, you will receive an invoice from [facility]. Please contact  Radiology at [phone] with questions or concerns regarding your invoice.   IF you received labwork today, you will receive an invoice from [facility]. Please contact [facility] at [phone] with questions or concerns regarding your invoice.   Our billing staff will not be able to assist you with questions regarding bills from these companies.  You will be contacted with the lab results as soon as they are available. The fastest way to get your results is to activate your My Chart account. Instructions are located on the last page of this paperwork. If you have not heard from us regarding the results in 2 weeks, please contact this office.     

## 2018-01-05 NOTE — Progress Notes (Signed)
This patient presents for DOT examination for fitness for duty.  Pt has had many in the past.  They are typically 1 year cards.   Patient Active Problem List   Diagnosis Date Noted  . Bad breath 05/25/2016  . Abnormal TSH - monitoring 05/09/2015  . Other and unspecified ovarian cyst - monitoring 05/30/2013  . Chronic constipation - on medications 04/17/2013  . Hyperlipidemia - watching 03/26/2007  . Anxiety state - stable on medications 03/26/2007  . Depression - stable on medications 03/26/2007  . Essential hypertension - well controlled on medications 03/26/2007  . Allergic rhinitis 03/26/2007  . GERD - on medications 03/26/2007  . IBS 03/26/2007  . COLONIC POLYPS, HX OF 03/26/2007     Medical History:  no  1. Head/Brain Injuries, disorders or illnesses no  2. Seizures, epilepsy no  3. Eye disorders or impaired vision (except corrective lenses) - wears glasses to drive no  4. Ear disorders, loss of hearing or balance no  5. Heart disease or heart attack, other cardiovascular condition no  6. Heart surgery (valve replacement/bypass, angioplasty, pacemaker/defribrillator) yes  7. High blood pressure yes  8. High holesterol no  9. Chronic cough, shortness of breath or other breathing problems no  10. Lung disease (emphysema, asthma or chronic bronchitis) no  11. Kidney disease, dialysis yes  12. Digestive problems  - controlled on medications no  13. Diabetes or elevated blood sugar  no  Insulin use yes  14. Nervous or psychiatric disorders, e.g., severe depression - well controlled on medications and has been the medications for 6 months no  15. Fainting or syncope no  16. Dizziness, headaches, numbness, tingling or memory loss no  17. Unexplained weight loss no  18. Stroke, TIA or paralysis no  19. Missing or impaired hand, arm, foot, leg, finger, toe no  20. Spinal injury or disease no  21. Bone, muscles or nerve problems no  22. Blood clots or bleeding bleeding  disorders no  23. Cancer no  24. Chronic infection or other chronic diseases no  25. Sleep disorders, pauses in breathing while asleep, daytime sleepiness, loud snoring no  26. Have you ever had a sleep test? yes  27.  Have you ever spent a night in the hospital? - surgical yes  28. Have you ever had a broken bone? - child no  29. Have you or or do you use tobacco products? no  30. Regular, frequent alcohol use no  31. Illegal substance use within the past 2 years no  32.  Have you ever failed a drug test or been dependent on an illegal substance?  Current Medications: Prior to Admission medications   Medication Sig Start Date End Date Taking? Authorizing Provider  amLODipine (NORVASC) 5 MG tablet Take 1 tablet (5 mg total) daily by mouth. 05/27/17  Yes Biagio Borg, MD  clonazePAM (KLONOPIN) 0.5 MG tablet TAKE 1 TABLET BY MOUTH TWICE DAILY AS NEEDED FOR ANXIETY 12/21/17  Yes Biagio Borg, MD  hydrochlorothiazide (MICROZIDE) 12.5 MG capsule Take 1 capsule (12.5 mg total) daily by mouth. 05/27/17  Yes Biagio Borg, MD  linaclotide [facility]) 145 MCG CAPS capsule Take 1 capsule (145 mcg total) daily by mouth. 05/27/17  Yes Biagio Borg, MD  losartan (COZAAR) 100 MG tablet Take 1 tablet (100 mg total) by mouth daily. 02/15/17  Yes Biagio Borg, MD  pantoprazole (PROTONIX) 40 MG tablet Take 1 tablet (40 mg total) by mouth 2 (two) times daily.  10/31/17  Yes Biagio Borg, MD  sertraline (ZOLOFT) 100 MG tablet 1 and 1/2 tab by mouth daily 10/31/17  Yes Biagio Borg, MD  acetaminophen (TYLENOL) 500 MG tablet Take 1,000 mg by mouth daily as needed (cramping).    [provider]  albuterol (PROVENTIL HFA;VENTOLIN HFA) 108 (90 BASE) MCG/ACT inhaler Inhale 2 puffs into the lungs every 6 (six) hours as needed. Shortness of breath Patient not taking: Reported on 01/05/2018 03/20/14   Biagio Borg, MD  fluticasone [facility]) 50 MCG/ACT nasal spray 2 sprays by Nasal route daily. 10/12/10 09/27/11  Biagio Borg, MD     TESTING:  Vision screening  Visual Acuity Screening   Right eye Left eye Both eyes  Without correction:     With correction: 20/20 20/20 20/20   Comments: Passed color test  Passed horizontal field test 85 degrees   Hearing Screening Comments: Passed whisper test   Monocular Vision: No.  Hearing Aid used for test: No. Hearing Aid required to to meet standard: No.  BP 122/80   Pulse 90   Temp 99 F (37.2 C) (Oral)   Resp 18   Ht 5' 2.21" (1.58 m)   Wt 170 lb 9.6 oz (77.4 kg)   LMP 10/01/2016   SpO2 97%   BMI 31.00 kg/m  Pulse rate is regular   Urine dip Comments: UA.   SG. 1.015  BL.neg   Pr. neg    GL.neg   PHYSICAL EXAMINATION:  1. Normal  General Appearance: Marked overweight, tremor, signs of alcoholism, problem drinking or drug abuse. 2. Normal Skin Exam - tattoos, scars 3. Normal Eyes: pupillary equality, reaction to light, accommodation, ocular motility, ocular muscle imbalance, extra ocular movement, nystagmus, exopthalmos. Ask about retinopathy, cataracts, aphakia, glaucoma, macular degeneration and refer to a specialist if appropriate.  4. Normal Ears: Scarring of tympanic membrane, occlusion of external canal, perforated eardrums.     5. Normal Mouth and Throat: Irremedial deformities likely to interfere with breathing or swallowing.    6. Normal Heart: Murmurs, extra sounds, enlarged heart, pacemaker, implantable defibrillator.     7. Normal Lungs and Chest, not including breast examination: Abnormal Chest wall expansion, abnormal respiratory rate, abnormal breath sounds including wheezes or alveolar rates, impaired respiratory function, cyanosis. Abnormal findings on physical exam may require further testing such as pulmonary tests and/or x ray of chest.  8. Normal Abdomen and Viscera: Enlarged liver, enlarged spleen, masses, bruits, hernia, significant abdominal wall muscle weakness.  9. Normal Genitourinary System: Hernia  10.  Normal Spine, other musculoskeletal: Previous surgery, deformities, limitation of motion, tenderness. 11. Normal Extremities-Limb impaired: Loss or impairment of leg, foot, toe, arm, hand, finger. Perceptible limp, deformities, atrophy, weakness, paralysis, clubbing, edema, hypotonia. Insufficient grasp and prehension to maintain steering wheel grip. Insufficient mobility and strength in lower limb to operate pedals properly. 12. Normal Neurological: Impaired equilibrium, coordination or speech pattern; paresthesia, asymmetric deep tendon reflexes, sensory or positional abnormalities, abnormal patellar and Babinski's reflexes 13. Normal Gait - antalgic, ataxia  14. Normal Vascular System: Abnormal pulse and amplitude, carotid or arterial bruits, varicose veins.   Does not meet standards. Certification Status: does not meet standards for 2 year certificate. Meets standards, but periodic monitoring required due to: HTN  Driver qualified only for: 1 year  Wearing corrective lenses: yes Wearing hearing aid: no Accompanied by a no waiver/exemption Skill performance Evaluation (SPE) Certificate: no Driving within an exempt intracity zone: no Qualified by operation of 49 CFR  563.89: no  Certification expires 01/06/2019  Windell Hummingbird PA-C  [facility] 01/05/2018 2:47 PM

## 2018-02-09 ENCOUNTER — Encounter: Payer: Self-pay | Admitting: Internal Medicine

## 2018-02-10 MED ORDER — NEUTRASAL MT PACK: PACK | OROMUCOSAL | 5 refills | Status: DC

## 2018-03-05 ENCOUNTER — Emergency Department
Admission: EM | Admit: 2018-03-05 | Discharge: 2018-03-05 | Disposition: A | Discharge status: Home / Self Care | Payer: BC Managed Care – PPO | Source: Home / Self Care | Attending: Family Medicine | Admitting: Family Medicine

## 2018-03-05 ENCOUNTER — Other Ambulatory Visit: Payer: Self-pay

## 2018-03-05 ENCOUNTER — Encounter: Payer: Self-pay | Admitting: Emergency Medicine

## 2018-03-05 DIAGNOSIS — R21 Rash and other nonspecific skin eruption: Secondary | ICD-10-CM | POA: Diagnosis not present

## 2018-03-05 MED ORDER — PREDNISONE 20 MG PO TABS: ORAL_TABLET | ORAL | 0 refills | Status: DC

## 2018-03-05 MED ORDER — TRIAMCINOLONE ACETONIDE 0.1 % EX CREA: 1.0000 "application " | TOPICAL_CREAM | Freq: Two times a day (BID) | CUTANEOUS | 0 refills | Status: DC

## 2018-03-05 MED ORDER — FLUCONAZOLE 150 MG PO TABS: 150.0000 mg | ORAL_TABLET | Freq: Once | ORAL | 0 refills | Status: DC

## 2018-03-05 MED ORDER — FLUCONAZOLE 150 MG PO TABS: 150.0000 mg | ORAL_TABLET | Freq: Once | ORAL | 0 refills | Status: AC

## 2018-03-05 MED ORDER — DOXYCYCLINE HYCLATE 100 MG PO CAPS: 100.0000 mg | ORAL_CAPSULE | Freq: Two times a day (BID) | ORAL | 0 refills | Status: DC

## 2018-03-05 NOTE — Discharge Instructions (Addendum)
May take Benadryl as needed for itching

## 2018-03-05 NOTE — ED Provider Notes (Signed)
[facility]    CSN: [id number] Arrival date & time: 03/05/18  1443     History   Chief Complaint Chief Complaint  Patient presents with  . Rash    HPI [name] is a 52 y.o. female.   Patient stats that about 3 weeks ago she had an insect bite on a right toe.  She later developed scattered small pruritic lesions on her arms, upper chest, and back.  Benadryl is helpful for the itching.  She feels well otherwise.  The history is provided by the patient.  Rash  Location: torso and arms. Quality: dryness, itchiness and redness   Quality: not blistering, not bruising, not burning, not draining, not painful, not peeling, not scaling, not swelling and not weeping   Severity:  Mild Onset quality:  Gradual Duration:  3 weeks Timing:  Constant Progression:  Worsening Chronicity:  New Context: insect bite/sting   Context: not animal contact, not chemical exposure, not exposure to similar rash, not food, not hot tub use, not medications, not new detergent/soap, not nuts, not plant contact, not sick contacts and not sun exposure   Relieved by:  Nothing Worsened by:  Nothing Ineffective treatments:  Antihistamines Associated symptoms: no abdominal pain, no diarrhea, no fatigue, no fever, no headaches, no induration, no joint pain, no myalgias, no nausea and no sore throat     Past Medical History:  Diagnosis Date  . Allergic rhinitis   . Allergy   . Depression   . Hyperplastic colon polyp 10/2003  . Menorrhagia   . Obesity   . PONV (postoperative nausea and vomiting)   . RLS (restless legs syndrome)     Patient Active Problem List   Diagnosis Date Noted  . Bad breath 05/25/2016  . Abnormal TSH 05/09/2015  . Other and unspecified ovarian cyst 05/30/2013  . Chronic constipation 04/17/2013  . Hyperlipidemia 03/26/2007  . Anxiety state 03/26/2007  . Depression 03/26/2007  . Essential hypertension 03/26/2007  . Allergic rhinitis 03/26/2007  . GERD  03/26/2007  . IBS 03/26/2007  . COLONIC POLYPS, HX OF 03/26/2007    Past Surgical History:  Procedure Laterality Date  . COLONOSCOPY    . CYSTOSCOPY N/A 10/12/2016   Procedure: CYSTOSCOPY;  Surgeon: Anastasio Auerbach, MD;  Location: [facility];  Service: Gynecology;  Laterality: N/A;  . ENDOMETRIAL ABLATION  2007   Dr. Landry Mellow at [facility]  . FOOT SURGERY    . LAPAROSCOPIC VAGINAL HYSTERECTOMY WITH SALPINGECTOMY Bilateral 10/12/2016   Procedure: LAPAROSCOPIC ASSISTED VAGINAL HYSTERECTOMY WITH SALPINGECTOMY;  Surgeon: Anastasio Auerbach, MD;  Location: [facility];  Service: Gynecology;  Laterality: Bilateral;  request to follow first case at around 10:30am.  Requests 2 1/2 hours for the case.  Marland Kitchen SALIVARY GLAND SURGERY  12-11   LEFT SIDE NODULE REMOVED WAS BENIGN  . TUBAL LIGATION      OB History    Gravida  2   Para  2   Term      Preterm      AB      Living  2     SAB      TAB      Ectopic      Multiple      Live Births               Home Medications    Prior to Admission medications   Medication Sig Start Date End Date Taking? Authorizing Provider  albuterol (PROVENTIL HFA;VENTOLIN HFA) 108 (  90 BASE) MCG/ACT inhaler Inhale 2 puffs into the lungs every 6 (six) hours as needed. Shortness of breath Patient not taking: Reported on 01/05/2018 03/20/14   Biagio Borg, MD  amLODipine (NORVASC) 5 MG tablet Take 1 tablet (5 mg total) daily by mouth. 05/27/17   Biagio Borg, MD  Artificial Saliva (NEUTRASAL) PACK 2 spray up to 4 times per day as needed 02/10/18   Biagio Borg, MD  clonazePAM (KLONOPIN) 0.5 MG tablet TAKE 1 TABLET BY MOUTH TWICE DAILY AS NEEDED FOR ANXIETY 12/21/17   Biagio Borg, MD  doxycycline (VIBRAMYCIN) 100 MG capsule Take 1 capsule (100 mg total) by mouth 2 (two) times daily. Take with food. 03/05/18   Kandra Nicolas, MD  hydrochlorothiazide (MICROZIDE) 12.5 MG capsule Take 1 capsule (12.5 mg total) daily by mouth. 05/27/17   Biagio Borg, MD    linaclotide [facility]) 145 MCG CAPS capsule Take 1 capsule (145 mcg total) daily by mouth. 05/27/17   Biagio Borg, MD  losartan (COZAAR) 100 MG tablet Take 1 tablet (100 mg total) by mouth daily. 02/15/17   Biagio Borg, MD  pantoprazole (PROTONIX) 40 MG tablet Take 1 tablet (40 mg total) by mouth 2 (two) times daily. 10/31/17   Biagio Borg, MD  predniSONE (DELTASONE) 20 MG tablet Take one tab by mouth twice daily for 4 days, then one daily. Take with food. 03/05/18   Kandra Nicolas, MD  sertraline (ZOLOFT) 100 MG tablet 1 and 1/2 tab by mouth daily 10/31/17   Biagio Borg, MD  triamcinolone cream (KENALOG) 0.1 % Apply 1 application topically 2 (two) times daily. 03/05/18   Kandra Nicolas, MD  fluticasone (FLONASE) 50 MCG/ACT nasal spray 2 sprays by Nasal route daily. 10/12/10 09/27/11  Biagio Borg, MD    Family History Family History  Problem Relation Age of Onset  . Cancer Mother        colon cancer at 66 yo  . Colon polyps Mother   . Hyperlipidemia Other   . Hypertension Other   . Heart attack Maternal Grandfather   . Esophageal cancer Neg Hx   . Rectal cancer Neg Hx   . Stomach cancer Neg Hx     Social History Social History   Tobacco Use  . Smoking status: Never Smoker  . Smokeless tobacco: Never Used  Substance Use Topics  . Alcohol use: Yes    Alcohol/week: 0.0 standard drinks    Comment: some  . Drug use: No     Allergies   Other and Sulfur   Review of Systems Review of Systems  Constitutional: Negative for fatigue and fever.  HENT: Negative for sore throat.   Gastrointestinal: Negative for abdominal pain, diarrhea and nausea.  Musculoskeletal: Negative for arthralgias and myalgias.  Skin: Positive for rash.  Neurological: Negative for headaches.  All other systems reviewed and are negative.    Physical Exam Triage Vital Signs ED Triage Vitals  Enc Vitals Group     BP 03/05/18 1504 (!) 155/93     Pulse Rate 03/05/18 1504 81     Resp 03/05/18  1504 16     Temp 03/05/18 1504 98.6 F (37 C)     Temp Source 03/05/18 1504 Oral     SpO2 03/05/18 1504 100 %     Weight 03/05/18 1505 170 lb (77.1 kg)     Height 03/05/18 1505 5\' 1"  (1.549 m)     Head Circumference --  Peak Flow --      Pain Score 03/05/18 1505 0     Pain Loc --      Pain Edu? --      Excl. in [place]? --    No data found.  Updated Vital Signs BP (!) 155/93 (BP Location: Right Arm)   Pulse 81   Temp 98.6 F (37 C) (Oral)   Resp 16   Ht 5\' 1"  (1.549 m)   Wt 77.1 kg   LMP 10/01/2016   SpO2 100%   BMI 32.12 kg/m   Visual Acuity Right Eye Distance:   Left Eye Distance:   Bilateral Distance:    Right Eye Near:   Left Eye Near:    Bilateral Near:     Physical Exam  Constitutional: She appears well-developed and well-nourished. No distress.  HENT:  Head: Normocephalic.  Right Ear: External ear normal.  Left Ear: External ear normal.  Nose: Nose normal.  Mouth/Throat: Oropharynx is clear and moist.  Eyes: Pupils are equal, round, and reactive to light. Conjunctivae are normal.  Neck: Neck supple.  Cardiovascular: Normal heart sounds.  Pulmonary/Chest: Breath sounds normal.  Abdominal: There is no tenderness.  Musculoskeletal: She exhibits no edema.  Lymphadenopathy:    She has no cervical adenopathy.  Neurological: She is alert.  Skin: Skin is warm and dry. Rash noted. Rash is maculopapular.     Upper back, upper anterior chest, and right upper arm have discrete collars of erythema around follicles.  Nursing note and vitals reviewed.    UC Treatments / Results  Labs (all labs ordered are listed, but only abnormal results are displayed) Labs Reviewed - No data to display  EKG None  Radiology No results found.  Procedures Procedures (including critical care time)  Medications Ordered in UC Medications - No data to display  Initial Impression / Assessment and Plan / UC Course  I have reviewed the triage vital signs and the nursing  notes.  Pertinent labs & imaging results that were available during my care of the patient were reviewed by me and considered in my medical decision making (see chart for details).    ?folliculitis Begin doxycycline and prednisone burst/taper.  Triamcinolone 0.1% cream BID as needed. Rx for Diflucan at patient's request. Followup with dermatologist if not improved about 10 days.   Final Clinical Impressions(s) / UC Diagnoses   Final diagnoses:  Rash and nonspecific skin eruption     Discharge Instructions     May take Benadryl as needed for itching    ED Prescriptions    Medication Sig Dispense Auth. Provider   doxycycline (VIBRAMYCIN) 100 MG capsule Take 1 capsule (100 mg total) by mouth 2 (two) times daily. Take with food. 14 capsule Kandra Nicolas, MD   predniSONE (DELTASONE) 20 MG tablet Take one tab by mouth twice daily for 4 days, then one daily. Take with food. 12 tablet Kandra Nicolas, MD   triamcinolone cream (KENALOG) 0.1 % Apply 1 application topically 2 (two) times daily. 30 g Kandra Nicolas, MD        Kandra Nicolas, MD 03/07/18 2009

## 2018-03-05 NOTE — ED Triage Notes (Signed)
Patient has rash that she thought might be insect bites; started 3 weeks ago on one toe; now on arms and upper chest and back; itches; has checked for bed bugs at home and during travels and has not seen any.

## 2018-03-07 ENCOUNTER — Telehealth: Payer: Self-pay

## 2018-03-07 NOTE — Telephone Encounter (Signed)
Left VM for patient to call if any questions or concerns.

## 2018-03-21 DIAGNOSIS — M542 Cervicalgia: Secondary | ICD-10-CM | POA: Insufficient documentation

## 2018-04-05 ENCOUNTER — Other Ambulatory Visit: Payer: Self-pay | Admitting: Internal Medicine

## 2018-04-17 ENCOUNTER — Ambulatory Visit: Payer: BC Managed Care – PPO | Admitting: Women's Health

## 2018-04-17 ENCOUNTER — Encounter: Payer: Self-pay | Admitting: Women's Health

## 2018-04-17 VITALS — BP 118/80

## 2018-04-17 DIAGNOSIS — Z113 Encounter for screening for infections with a predominantly sexual mode of transmission: Secondary | ICD-10-CM | POA: Diagnosis not present

## 2018-04-17 LAB — WET PREP FOR TRICH, YEAST, CLUE

## 2018-04-17 MED ORDER — ESTRADIOL 0.05 MG/24HR TD PTTW: 1.0000 | MEDICATED_PATCH | TRANSDERMAL | 3 refills | Status: DC

## 2018-04-17 NOTE — Progress Notes (Signed)
52 year old SBF G2, P2 presents requesting STD screen, partner unfaithful, no longer with partner.  Negative STD screen 11/2017.  Vaginal discharge, abdominal pain, urinary symptoms or fever.  2018 LAVH for fibroids and has had increased hot flushes, poor sleep and moodiness in the last few months.  States is needing Klonopin more often for rest.  Medical problems include hypertension, asthma, IBS and anxiety.  Exam: Appears well.  Abdomen soft, nontender, external genitalia mild erythema, speculum exam no visual discharge, wet prep negative, GC/Chlamydia culture taken from urethra.  Nontender.  STD screen Menopausal symptoms  Plan: GC/Chlamydia culture pending, reviewed normality of wet prep.  HIV, hep B, C, RPR.  Condoms encouraged if sexually active.  RT reviewed risks of blood clots, strokes and breast cancer, options reviewed, estradiol  patch 0.05 mg twice weekly, proper use reviewed, instructed to call if no relief of symptoms.

## 2018-04-18 LAB — C. TRACHOMATIS/N. GONORRHOEAE RNA
C. TRACHOMATIS RNA, TMA: NOT DETECTED
N. GONORRHOEAE RNA, TMA: NOT DETECTED

## 2018-04-18 LAB — HEPATITIS C ANTIBODY
HEP C AB: NONREACTIVE
SIGNAL TO CUT-OFF: 0.02 (ref <1.00)

## 2018-04-18 LAB — HEPATITIS B SURFACE ANTIGEN: Hepatitis B Surface Ag: NONREACTIVE

## 2018-04-18 LAB — HIV ANTIBODY (ROUTINE TESTING W REFLEX): HIV 1&2 Ab, 4th Generation: NONREACTIVE

## 2018-04-18 LAB — RPR: RPR: NONREACTIVE

## 2018-05-03 ENCOUNTER — Encounter: Payer: Self-pay | Admitting: Family

## 2018-05-03 ENCOUNTER — Ambulatory Visit: Payer: BC Managed Care – PPO | Admitting: Family

## 2018-05-03 VITALS — BP 138/88 | HR 75 | Temp 98.1°F | Ht 61.0 in | Wt 174.1 lb

## 2018-05-03 DIAGNOSIS — M545 Low back pain, unspecified: Secondary | ICD-10-CM

## 2018-05-03 MED ORDER — METHOCARBAMOL 500 MG PO TABS: 500.0000 mg | ORAL_TABLET | Freq: Every evening | ORAL | 0 refills | Status: DC | PRN

## 2018-05-03 MED ORDER — IBUPROFEN 800 MG PO TABS: 800.0000 mg | ORAL_TABLET | Freq: Three times a day (TID) | ORAL | 0 refills | Status: DC | PRN

## 2018-05-03 NOTE — Progress Notes (Signed)
[name] is a 52 y.o. female with the following history as recorded in EpicCare:  Patient Active Problem List   Diagnosis Date Noted  . Bad breath 05/25/2016  . Abnormal TSH 05/09/2015  . Other and unspecified ovarian cyst 05/30/2013  . Chronic constipation 04/17/2013  . Hyperlipidemia 03/26/2007  . Anxiety state 03/26/2007  . Depression 03/26/2007  . Essential hypertension 03/26/2007  . Allergic rhinitis 03/26/2007  . GERD 03/26/2007  . IBS 03/26/2007  . COLONIC POLYPS, HX OF 03/26/2007    Current Outpatient Medications  Medication Sig Dispense Refill  . amLODipine (NORVASC) 5 MG tablet Take 1 tablet (5 mg total) daily by mouth. 90 tablet 3  . Artificial Saliva (NEUTRASAL) PACK 2 spray up to 4 times per day as needed 120 each 5  . clonazePAM (KLONOPIN) 0.5 MG tablet TAKE 1 TABLET BY MOUTH TWICE DAILY AS NEEDED FOR ANXIETY 60 tablet 2  . estradiol (VIVELLE-DOT) 0.05 MG/24HR patch Place 1 patch (0.05 mg total) onto the skin 2 (two) times a week. 24 patch 3  . Fluocinolone Acetonide Body 0.01 % OIL APPLY OIL TOPICALLY TO AFFECTED AREA ONCE DAILY TO TWICE DAILY AS NEEDED FOR FLAKING ON SCALP    . Fluocinolone Acetonide Body 0.01 % OIL APPLY OIL TOPICALLY TO AFFECTED AREA ONCE DAILY TO TWICE DAILY AS NEEDED FOR FLAKING ON SCALP  11  . hydrochlorothiazide (MICROZIDE) 12.5 MG capsule Take 1 capsule (12.5 mg total) daily by mouth. 90 capsule 3  . ketoconazole (NIZORAL) 2 % cream Apply to affected area twice a day To affected areas of face    . ketoconazole (NIZORAL) 2 % shampoo SHAMPOO FACE AND SCALP TWICE WEEKLY. LET LATHER SIT FOR 2 TO 3 MINUTES BEFORE RINSING    . linaclotide (LINZESS) 145 MCG CAPS capsule Take 1 capsule (145 mcg total) daily by mouth. 30 capsule 11  . losartan (COZAAR) 100 MG tablet TAKE 1 TABLET BY MOUTH ONCE DAILY 90 tablet 3  . pantoprazole (PROTONIX) 40 MG tablet Take 1 tablet (40 mg total) by mouth 2 (two) times daily. 180 tablet 3  . sertraline (ZOLOFT) 100  MG tablet 1 and 1/2 tab by mouth daily 135 tablet 3  . triamcinolone cream (KENALOG) 0.1 % Apply 1 application topically 2 (two) times daily. 30 g 0  . ibuprofen (ADVIL,MOTRIN) 800 MG tablet Take 1 tablet (800 mg total) by mouth every 8 (eight) hours as needed. 60 tablet 0  . methocarbamol (ROBAXIN) 500 MG tablet Take 1 tablet (500 mg total) by mouth at bedtime as needed. 20 tablet 0   No current facility-administered medications for this visit.     Allergies: Other and Sulfur  Past Medical History:  Diagnosis Date  . Allergic rhinitis   . Allergy   . Depression   . Hyperplastic colon polyp 10/2003  . Menorrhagia   . Obesity   . PONV (postoperative nausea and vomiting)   . RLS (restless legs syndrome)     Past Surgical History:  Procedure Laterality Date  . COLONOSCOPY    . CYSTOSCOPY N/A 10/12/2016   Procedure: CYSTOSCOPY;  Surgeon: Anastasio Auerbach, MD;  Location: [facility];  Service: Gynecology;  Laterality: N/A;  . ENDOMETRIAL ABLATION  2007   Dr. Landry Mellow at [facility]  . FOOT SURGERY    . LAPAROSCOPIC VAGINAL HYSTERECTOMY WITH SALPINGECTOMY Bilateral 10/12/2016   Procedure: LAPAROSCOPIC ASSISTED VAGINAL HYSTERECTOMY WITH SALPINGECTOMY;  Surgeon: Anastasio Auerbach, MD;  Location: [facility];  Service: Gynecology;  Laterality: Bilateral;  request to follow first case at around 10:30am.  Requests 2 1/2 hours for the case.  Marland Kitchen SALIVARY GLAND SURGERY  12-11   LEFT SIDE NODULE REMOVED WAS BENIGN  . TUBAL LIGATION      Family History  Problem Relation Age of Onset  . Cancer Mother        colon cancer at 74 yo  . Colon polyps Mother   . Hyperlipidemia Other   . Hypertension Other   . Heart attack Maternal Grandfather   . Esophageal cancer Neg Hx   . Rectal cancer Neg Hx   . Stomach cancer Neg Hx     Social History   Tobacco Use  . Smoking status: Never Smoker  . Smokeless tobacco: Never Used  Substance Use Topics  . Alcohol use: Yes    Alcohol/week: 0.0 standard drinks     Comment: some    Subjective:  Patient is in the process of moving- feels that she injured her back while helping to unpack her belongings; denies any numbness or tingling into her legs; feels like "tight" through her low back; has been using Ibuprofen 800 mg with some benefit; no change in bowel or bladder habits;     Objective:  Vitals:   05/03/18 1449  BP: 138/88  Pulse: 75  Temp: 98.1 F (36.7 C)  TempSrc: Oral  SpO2: 99%  Weight: 174 lb 1.3 oz (79 kg)  Height: 5\' 1"  (1.549 m)    General: Well developed, well nourished, in no acute distress  Skin : Warm and dry.  Head: Normocephalic and atraumatic  Lungs: Respirations unlabored; clear to auscultation bilaterally without wheeze, rales, rhonchi  CVS exam: normal rate and regular rhythm.  Musculoskeletal: No deformities; no active joint inflammation  Extremities: No edema, cyanosis, clubbing  Vessels: Symmetric bilaterally  Neurologic: Alert and oriented; speech intact; face symmetrical; moves all extremities well; CNII-XII intact without focal deficit   Assessment:  1. Acute bilateral low back pain without sciatica     Plan:  Rx for Ibuprofen 800 mg tid prn and Robaxin to use at night; apply heat and rest; work note given as requested; follow-up worse, no better.   No follow-ups on file.  No orders of the defined types were placed in this encounter.   Requested Prescriptions   Signed Prescriptions Disp Refills  . ibuprofen (ADVIL,MOTRIN) 800 MG tablet 60 tablet 0    Sig: Take 1 tablet (800 mg total) by mouth every 8 (eight) hours as needed.  . methocarbamol (ROBAXIN) 500 MG tablet 20 tablet 0    Sig: Take 1 tablet (500 mg total) by mouth at bedtime as needed.

## 2018-05-04 ENCOUNTER — Telehealth: Payer: Self-pay | Admitting: Family

## 2018-05-04 NOTE — Telephone Encounter (Signed)
Copied from [place] [id number]. Topic: General - Other >> May 04, 2018  1:28 PM Bea Graff, NT wrote: Reason for CRM: Pt states she needs a out of work note for Friday, 05/05/18. She states she has decided that she will need that day off as well to re-coop and will return to work on Monday. Please call when ready for pick-up.   Please advise, can complete if you need. Mickel Baas had her out of work until 10/17 - She was seen by Mickel Baas on 10/16. Thank you.

## 2018-05-05 ENCOUNTER — Encounter: Payer: Self-pay | Admitting: Family

## 2018-05-05 NOTE — Telephone Encounter (Signed)
Called patient to inform , but VM box is not set up.   Letter is ready to be picked up.

## 2018-05-05 NOTE — Telephone Encounter (Signed)
I will put letter on your desk.

## 2018-05-20 ENCOUNTER — Ambulatory Visit: Payer: BC Managed Care – PPO | Admitting: Family Medicine

## 2018-05-20 ENCOUNTER — Other Ambulatory Visit (HOSPITAL_COMMUNITY)
Admission: RE | Admit: 2018-05-20 | Discharge: 2018-05-20 | Disposition: A | Discharge status: Home / Self Care | Payer: BC Managed Care – PPO | Source: Other Acute Inpatient Hospital | Attending: Family Medicine | Admitting: Family Medicine

## 2018-05-20 ENCOUNTER — Encounter: Payer: Self-pay | Admitting: Family Medicine

## 2018-05-20 VITALS — BP 148/96 | HR 84 | Temp 98.2°F | Ht 61.0 in | Wt 174.0 lb

## 2018-05-20 DIAGNOSIS — R300 Dysuria: Secondary | ICD-10-CM

## 2018-05-20 LAB — POCT URINALYSIS DIPSTICK
Bilirubin, UA: NEGATIVE
Glucose, UA: NEGATIVE
KETONES UA: NEGATIVE
NITRITE UA: NEGATIVE
PH UA: 6.5 (ref 5.0–8.0)
PROTEIN UA: POSITIVE — AB
Spec Grav, UA: 1.01 (ref 1.010–1.025)
UROBILINOGEN UA: 0.2 U/dL

## 2018-05-20 MED ORDER — NITROFURANTOIN MONOHYD MACRO 100 MG PO CAPS: 100.0000 mg | ORAL_CAPSULE | Freq: Two times a day (BID) | ORAL | 0 refills | Status: DC

## 2018-05-20 MED ORDER — FLUCONAZOLE 150 MG PO TABS: 150.0000 mg | ORAL_TABLET | Freq: Once | ORAL | 0 refills | Status: AC

## 2018-05-20 NOTE — Progress Notes (Signed)
Patient: [name] MRN: [id number] DOB: Jan 14, 1966 PCP: Biagio Borg, MD     Subjective:  Chief Complaint  Patient presents with  . Dysuria    X 1-2 weeks    HPI: The patient is a 52 y.o. female who presents today for possible UTI. She states she has had symptoms x 1 week. She states her urine has an odor, is cloudy and it hurts when she urinates, especially at th end of the stream.  She has increased frequency and urgency. No visible blood in her urine She also has some back pain that is new. No fever/chills. No abdominal pain and no N/V.   Review of Systems  Constitutional: Negative for chills and fever.  Respiratory: Negative for cough and shortness of breath.   Gastrointestinal: Negative for abdominal pain, constipation, diarrhea, nausea and vomiting.  Genitourinary: Positive for dysuria, frequency and urgency. Negative for difficulty urinating, flank pain and hematuria.    Allergies Patient is allergic to other and sulfur.  Past Medical History Patient  has a past medical history of Allergic rhinitis, Allergy, Depression, Hyperplastic colon polyp (10/2003), Menorrhagia, Obesity, PONV (postoperative nausea and vomiting), and RLS (restless legs syndrome).  Surgical History Patient  has a past surgical history that includes Tubal ligation; Endometrial ablation (2007); Salivary gland surgery (12-11); Colonoscopy; Foot surgery; Laparoscopic vaginal hysterectomy with salpingectomy (Bilateral, 10/12/2016); and Cystoscopy (N/A, 10/12/2016).  Family History Pateint's family history includes Cancer in her mother; Colon polyps in her mother; Heart attack in her maternal grandfather; Hyperlipidemia in her other; Hypertension in her other.  Social History Patient  reports that she has never smoked. She has never used smokeless tobacco. She reports that she drinks alcohol. She reports that she does not use drugs.    Objective: Vitals:   05/20/18 0913  BP: (!) 148/96  Pulse: 84   Temp: 98.2 F (36.8 C)  TempSrc: Oral  SpO2: 97%  Weight: 174 lb (78.9 kg)  Height: 5\' 1"  (1.549 m)    Body mass index is 32.88 kg/m.  Physical Exam  Constitutional: She appears well-developed and well-nourished.  Neck: Normal range of motion. Neck supple.  Cardiovascular: Normal rate, regular rhythm and normal heart sounds.  Pulmonary/Chest: Effort normal and breath sounds normal.  Abdominal: Soft. Bowel sounds are normal. There is no tenderness.  Musculoskeletal:  No cva tenderness bilaterally   Skin: Skin is warm.  Vitals reviewed.  UA: 3+ le, 1+ blood, 1+ protein, negative nit.      Assessment/plan: 1. Dysuria Treating for UTI. Culture pending. Allergy to sulfur, but she is not sure if this is sulfa so won't do bactrim. Course of macrobid. Push fluids. F/u on culture. Diflucan x 1 as she is prone to yeast infections with abx. Precautions given for fever/chills, n/v or flank pain.  - POCT urinalysis dipstick - Urine Culture; Future   -f/u with pcp for blood pressure.    Return if symptoms worsen or fail to improve.     Orma Flaming, MD [place]  05/20/2018

## 2018-05-20 NOTE — Patient Instructions (Signed)

## 2018-05-22 LAB — URINE CULTURE: Culture: 30000 — AB

## 2018-05-25 ENCOUNTER — Other Ambulatory Visit: Payer: Self-pay | Admitting: Internal Medicine

## 2018-05-30 ENCOUNTER — Ambulatory Visit (INDEPENDENT_AMBULATORY_CARE_PROVIDER_SITE_OTHER): Payer: BC Managed Care – PPO | Admitting: Internal Medicine

## 2018-05-30 ENCOUNTER — Encounter: Payer: Self-pay | Admitting: Internal Medicine

## 2018-05-30 ENCOUNTER — Other Ambulatory Visit (INDEPENDENT_AMBULATORY_CARE_PROVIDER_SITE_OTHER): Payer: BC Managed Care – PPO

## 2018-05-30 ENCOUNTER — Telehealth: Payer: Self-pay

## 2018-05-30 ENCOUNTER — Other Ambulatory Visit: Payer: Self-pay | Admitting: Internal Medicine

## 2018-05-30 DIAGNOSIS — Z0000 Encounter for general adult medical examination without abnormal findings: Secondary | ICD-10-CM

## 2018-05-30 DIAGNOSIS — R300 Dysuria: Secondary | ICD-10-CM

## 2018-05-30 DIAGNOSIS — Z23 Encounter for immunization: Secondary | ICD-10-CM | POA: Diagnosis not present

## 2018-05-30 LAB — CBC WITH DIFFERENTIAL/PLATELET
BASOS ABS: 0 10*3/uL (ref 0.0–0.1)
Basophils Relative: 0.3 % (ref 0.0–3.0)
Eosinophils Absolute: 0 10*3/uL (ref 0.0–0.7)
Eosinophils Relative: 0.5 % (ref 0.0–5.0)
HCT: 40.9 % (ref 36.0–46.0)
Hemoglobin: 13.8 g/dL (ref 12.0–15.0)
LYMPHS ABS: 2.3 10*3/uL (ref 0.7–4.0)
Lymphocytes Relative: 26.2 % (ref 12.0–46.0)
MCHC: 33.7 g/dL (ref 30.0–36.0)
MCV: 90 fl (ref 78.0–100.0)
MONOS PCT: 8.7 % (ref 3.0–12.0)
Monocytes Absolute: 0.8 10*3/uL (ref 0.1–1.0)
NEUTROS ABS: 5.7 10*3/uL (ref 1.4–7.7)
NEUTROS PCT: 64.3 % (ref 43.0–77.0)
PLATELETS: 244 10*3/uL (ref 150.0–400.0)
RBC: 4.54 Mil/uL (ref 3.87–5.11)
RDW: 14.8 % (ref 11.5–15.5)
WBC: 8.9 10*3/uL (ref 4.0–10.5)

## 2018-05-30 LAB — HEPATIC FUNCTION PANEL
ALK PHOS: 68 U/L (ref 39–117)
ALT: 10 U/L (ref 0–35)
AST: 20 U/L (ref 0–37)
Albumin: 4.8 g/dL (ref 3.5–5.2)
Bilirubin, Direct: 0.1 mg/dL (ref 0.0–0.3)
Total Bilirubin: 0.6 mg/dL (ref 0.2–1.2)
Total Protein: 8.3 g/dL (ref 6.0–8.3)

## 2018-05-30 LAB — URINALYSIS, ROUTINE W REFLEX MICROSCOPIC
BILIRUBIN URINE: NEGATIVE
LEUKOCYTES UA: NEGATIVE
NITRITE: NEGATIVE
RBC / HPF: NONE SEEN (ref 0–?)
Specific Gravity, Urine: 1.02 (ref 1.000–1.030)
TOTAL PROTEIN, URINE-UPE24: NEGATIVE
URINE GLUCOSE: NEGATIVE
UROBILINOGEN UA: 0.2 (ref 0.0–1.0)
pH: 6 (ref 5.0–8.0)

## 2018-05-30 LAB — BASIC METABOLIC PANEL
BUN: 11 mg/dL (ref 6–23)
CALCIUM: 9.7 mg/dL (ref 8.4–10.5)
CHLORIDE: 99 meq/L (ref 96–112)
CO2: 29 meq/L (ref 19–32)
Creatinine, Ser: 0.7 mg/dL (ref 0.40–1.20)
GFR: 113.94 mL/min (ref >60.00)
Glucose, Bld: 86 mg/dL (ref 70–99)
Potassium: 3.3 mEq/L — ABNORMAL LOW (ref 3.5–5.1)
SODIUM: 139 meq/L (ref 135–145)

## 2018-05-30 LAB — LIPID PANEL
CHOLESTEROL: 248 mg/dL — AB (ref 0–200)
HDL: 50.3 mg/dL (ref >39.00)
LDL Cholesterol: 168 mg/dL — ABNORMAL HIGH (ref 0–99)
NonHDL: 198.07
TRIGLYCERIDES: 149 mg/dL (ref 0.0–149.0)
Total CHOL/HDL Ratio: 5
VLDL: 29.8 mg/dL (ref 0.0–40.0)

## 2018-05-30 LAB — TSH: TSH: 0.57 u[IU]/mL (ref 0.35–4.50)

## 2018-05-30 MED ORDER — POTASSIUM CHLORIDE ER 10 MEQ PO TBCR: 10.0000 meq | EXTENDED_RELEASE_TABLET | Freq: Every day | ORAL | 3 refills | Status: DC

## 2018-05-30 MED ORDER — SERTRALINE HCL 100 MG PO TABS: 100.0000 mg | ORAL_TABLET | Freq: Every day | ORAL | 3 refills | Status: DC

## 2018-05-30 NOTE — Telephone Encounter (Signed)
Pt has been informed of results and expressed understanding.  °

## 2018-05-30 NOTE — Telephone Encounter (Signed)
-----   Message from Biagio Borg, MD sent at 05/30/2018 12:58 PM EST ----- Letter sent, cont same tx except  The test results show that your current treatment is OK, except the potassium is low, most likely due to the fluid pill.  We should add a potassium medication once daily to correct this.  I will send the prescription, and you should hear from the office as well.    Shirron to please inform pt, I will do rx

## 2018-05-30 NOTE — Telephone Encounter (Signed)
Done erx 

## 2018-05-30 NOTE — Progress Notes (Signed)
Subjective:    Patient ID: [name], female    DOB: 06/08/1966, 52 y.o.   MRN: [id number]  HPI  Here for wellness and f/u;  Overall doing ok;  Pt denies Chest pain, worsening SOB, DOE, wheezing, orthopnea, PND, worsening LE edema, palpitations, dizziness or syncope.  Pt denies neurological change such as new headache, facial or extremity weakness.  Pt denies polydipsia, polyuria, or low sugar symptoms. Pt states overall good compliance with treatment and medications, good tolerability, and has been trying to follow appropriate diet.  Pt denies worsening depressive symptoms, suicidal ideation or panic. No fever, night sweats, wt loss, loss of appetite, or other constitutional symptoms.  Pt states good ability with ADL's, has low fall risk, home safety reviewed and adequate, no other significant changes in hearing or vision, and only occasionally active with exercise.  Much less depressed recently, living on her own. No new complaints. Lost 2 lbs with better diet. Working 2 jobs, looking for another Past Medical History:  Diagnosis Date  . Allergic rhinitis   . Allergy   . Depression   . Hyperplastic colon polyp 10/2003  . Menorrhagia   . Obesity   . PONV (postoperative nausea and vomiting)   . RLS (restless legs syndrome)    Past Surgical History:  Procedure Laterality Date  . COLONOSCOPY    . CYSTOSCOPY N/A 10/12/2016   Procedure: CYSTOSCOPY;  Surgeon: Anastasio Auerbach, MD;  Location: [facility];  Service: Gynecology;  Laterality: N/A;  . ENDOMETRIAL ABLATION  2007   Dr. Landry Mellow at [facility]  . FOOT SURGERY    . LAPAROSCOPIC VAGINAL HYSTERECTOMY WITH SALPINGECTOMY Bilateral 10/12/2016   Procedure: LAPAROSCOPIC ASSISTED VAGINAL HYSTERECTOMY WITH SALPINGECTOMY;  Surgeon: Anastasio Auerbach, MD;  Location: [facility];  Service: Gynecology;  Laterality: Bilateral;  request to follow first case at around 10:30am.  Requests 2 1/2 hours for the case.  Marland Kitchen SALIVARY GLAND SURGERY  12-11   LEFT SIDE  NODULE REMOVED WAS BENIGN  . TUBAL LIGATION      reports that she has never smoked. She has never used smokeless tobacco. She reports that she drinks alcohol. She reports that she does not use drugs. family history includes Cancer in her mother; Colon polyps in her mother; Heart attack in her maternal grandfather; Hyperlipidemia in her other; Hypertension in her other. Allergies  Allergen Reactions  . Other     POLLEN AND DUST  . Sulfur Hives   Current Outpatient Medications on File Prior to Visit  Medication Sig Dispense Refill  . amLODipine (NORVASC) 5 MG tablet Take 1 tablet (5 mg total) daily by mouth. 90 tablet 3  . Artificial Saliva (NEUTRASAL) PACK 2 spray up to 4 times per day as needed 120 each 5  . clonazePAM (KLONOPIN) 0.5 MG tablet TAKE 1 TABLET BY MOUTH TWICE DAILY AS NEEDED FOR ANXIETY 60 tablet 2  . estradiol (VIVELLE-DOT) 0.05 MG/24HR patch Place 1 patch (0.05 mg total) onto the skin 2 (two) times a week. 24 patch 3  . Fluocinolone Acetonide Body 0.01 % OIL APPLY OIL TOPICALLY TO AFFECTED AREA ONCE DAILY TO TWICE DAILY AS NEEDED FOR FLAKING ON SCALP    . Fluocinolone Acetonide Body 0.01 % OIL APPLY OIL TOPICALLY TO AFFECTED AREA ONCE DAILY TO TWICE DAILY AS NEEDED FOR FLAKING ON SCALP  11  . hydrochlorothiazide (MICROZIDE) 12.5 MG capsule Take 1 capsule (12.5 mg total) daily by mouth. 90 capsule 3  . ibuprofen (ADVIL,MOTRIN) 800 MG tablet  Take 1 tablet (800 mg total) by mouth every 8 (eight) hours as needed. 60 tablet 0  . ketoconazole (NIZORAL) 2 % cream Apply to affected area twice a day To affected areas of face    . ketoconazole (NIZORAL) 2 % shampoo SHAMPOO FACE AND SCALP TWICE WEEKLY. LET LATHER SIT FOR 2 TO 3 MINUTES BEFORE RINSING    . linaclotide (LINZESS) 145 MCG CAPS capsule Take 1 capsule (145 mcg total) daily by mouth. 30 capsule 11  . losartan (COZAAR) 100 MG tablet TAKE 1 TABLET BY MOUTH ONCE DAILY 90 tablet 3  . methocarbamol (ROBAXIN) 500 MG tablet Take 1  tablet (500 mg total) by mouth at bedtime as needed. 20 tablet 0  . nitrofurantoin, macrocrystal-monohydrate, (MACROBID) 100 MG capsule Take 1 capsule (100 mg total) by mouth 2 (two) times daily. 14 capsule 0  . pantoprazole (PROTONIX) 40 MG tablet Take 1 tablet (40 mg total) by mouth 2 (two) times daily. 180 tablet 3  . triamcinolone cream (KENALOG) 0.1 % Apply 1 application topically 2 (two) times daily. 30 g 0  . [DISCONTINUED] fluticasone (FLONASE) 50 MCG/ACT nasal spray 2 sprays by Nasal route daily. 16 g 2   No current facility-administered medications on file prior to visit.    Review of Systems Constitutional: Negative for other unusual diaphoresis, sweats, appetite or weight changes HENT: Negative for other worsening hearing loss, ear pain, facial swelling, mouth sores or neck stiffness.   Eyes: Negative for other worsening pain, redness or other visual disturbance.  Respiratory: Negative for other stridor or swelling Cardiovascular: Negative for other palpitations or other chest pain  Gastrointestinal: Negative for worsening diarrhea or loose stools, blood in stool, distention or other pain Genitourinary: Negative for hematuria, flank pain or other change in urine volume.  Musculoskeletal: Negative for myalgias or other joint swelling.  Skin: Negative for other color change, or other wound or worsening drainage.  Neurological: Negative for other syncope or numbness. Hematological: Negative for other adenopathy or swelling Psychiatric/Behavioral: Negative for hallucinations, other worsening agitation, SI, self-injury, or new decreased concentration All other system neg per pt    Objective:   Physical Exam BP 132/86   Pulse 81   Temp 98 F (36.7 C) (Oral)   Ht 5\' 1"  (1.549 m)   Wt 172 lb (78 kg)   LMP 10/01/2016   SpO2 95%   BMI 32.50 kg/m  VS noted,  Constitutional: Pt is oriented to person, place, and time. Appears well-developed and well-nourished, in no significant  distress and comfortable Head: Normocephalic and atraumatic  Eyes: Conjunctivae and EOM are normal. Pupils are equal, round, and reactive to light Right Ear: External ear normal without discharge Left Ear: External ear normal without discharge Nose: Nose without discharge or deformity Mouth/Throat: Oropharynx is without other ulcerations and moist  Neck: Normal range of motion. Neck supple. No JVD present. No tracheal deviation present or significant neck LA or mass Cardiovascular: Normal rate, regular rhythm, normal heart sounds and intact distal pulses.   Pulmonary/Chest: WOB normal and breath sounds without rales or wheezing  Abdominal: Soft. Bowel sounds are normal. NT. No HSM  Musculoskeletal: Normal range of motion. Exhibits no edema Lymphadenopathy: Has no other cervical adenopathy.  Neurological: Pt is alert and oriented to person, place, and time. Pt has normal reflexes. No cranial nerve deficit. Motor grossly intact, Gait intact Skin: Skin is warm and dry. No rash noted or new ulcerations Psychiatric:  Has normal mood and affect. Behavior is normal without  agitation No other exam findings Lab Results  Component Value Date   WBC 8.1 10/31/2017   HGB 13.1 10/31/2017   HCT 39.4 10/31/2017   PLT 223.0 10/31/2017   GLUCOSE 89 10/31/2017   CHOL 215 (H) 05/27/2017   TRIG 96.0 05/27/2017   HDL 48.80 05/27/2017   LDLDIRECT 156.0 04/17/2013   LDLCALC 147 (H) 05/27/2017   ALT 4 10/31/2017   AST 11 10/31/2017   NA 138 10/31/2017   K 3.3 (L) 10/31/2017   CL 103 10/31/2017   CREATININE 0.68 10/31/2017   BUN 9 10/31/2017   CO2 25 10/31/2017   TSH 0.73 05/27/2017       Assessment & Plan:

## 2018-05-30 NOTE — Patient Instructions (Addendum)

## 2018-05-30 NOTE — Assessment & Plan Note (Signed)

## 2018-05-31 LAB — URINE CULTURE
MICRO NUMBER:: 91361247
SPECIMEN QUALITY:: ADEQUATE

## 2018-07-13 ENCOUNTER — Other Ambulatory Visit: Payer: Self-pay | Admitting: Internal Medicine

## 2018-07-13 DIAGNOSIS — Z1231 Encounter for screening mammogram for malignant neoplasm of breast: Secondary | ICD-10-CM

## 2018-09-04 ENCOUNTER — Other Ambulatory Visit: Payer: Self-pay | Admitting: Internal Medicine

## 2018-09-18 ENCOUNTER — Ambulatory Visit: Payer: BC Managed Care – PPO | Admitting: Internal Medicine

## 2018-09-18 ENCOUNTER — Encounter: Payer: Self-pay | Admitting: Internal Medicine

## 2018-09-18 DIAGNOSIS — I10 Essential (primary) hypertension: Secondary | ICD-10-CM | POA: Diagnosis not present

## 2018-09-18 DIAGNOSIS — J069 Acute upper respiratory infection, unspecified: Secondary | ICD-10-CM

## 2018-09-18 MED ORDER — HYDROCODONE-HOMATROPINE 5-1.5 MG/5ML PO SYRP: 5.0000 mL | ORAL_SOLUTION | Freq: Four times a day (QID) | ORAL | 0 refills | Status: AC | PRN

## 2018-09-18 MED ORDER — FLUCONAZOLE 150 MG PO TABS: ORAL_TABLET | ORAL | 1 refills | Status: DC

## 2018-09-18 MED ORDER — LEVOFLOXACIN 500 MG PO TABS: 500.0000 mg | ORAL_TABLET | Freq: Every day | ORAL | 0 refills | Status: AC

## 2018-09-18 NOTE — Assessment & Plan Note (Signed)
stable overall by history and exam, recent data reviewed with pt, and pt to continue medical treatment as before,  to f/u any worsening symptoms or concerns  

## 2018-09-18 NOTE — Patient Instructions (Signed)
Please take all new medication as prescribed - the antibiotic, cough medicine as needed, and the diflucan  You can also take Delsym OTC for cough, and/or Mucinex (or it's generic off brand) for congestion, and tylenol as needed for pain.  Please continue all other medications as before, and refills have been done if requested.  Please have the pharmacy call with any other refills you may need.  Please keep your appointments with your specialists as you may have planned  You are given the work note

## 2018-09-18 NOTE — Assessment & Plan Note (Signed)
Mild to mod, for antibx course,  to f/u any worsening symptoms or concerns 

## 2018-09-18 NOTE — Progress Notes (Signed)
Subjective:    Patient ID: [name], female    DOB: 31-Mar-1966, 53 y.o.   MRN: [id number]  HPI   Here with 2-3 days acute onset fever, facial pain, pressure, headache, general weakness and malaise, and greenish d/c, with mild ST and cough, but pt denies chest pain, wheezing, increased sob or doe, orthopnea, PND, increased LE swelling, palpitations, dizziness or syncope. 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   Pt denies polydipsia, polyuria.  Past Medical History:  Diagnosis Date  . Allergic rhinitis   . Allergy   . Depression   . Hyperplastic colon polyp 10/2003  . Menorrhagia   . Obesity   . PONV (postoperative nausea and vomiting)   . RLS (restless legs syndrome)    Past Surgical History:  Procedure Laterality Date  . COLONOSCOPY    . CYSTOSCOPY N/A 10/12/2016   Procedure: CYSTOSCOPY;  Surgeon: Anastasio Auerbach, MD;  Location: [facility];  Service: Gynecology;  Laterality: N/A;  . ENDOMETRIAL ABLATION  2007   Dr. Landry Mellow at [facility]  . FOOT SURGERY    . LAPAROSCOPIC VAGINAL HYSTERECTOMY WITH SALPINGECTOMY Bilateral 10/12/2016   Procedure: LAPAROSCOPIC ASSISTED VAGINAL HYSTERECTOMY WITH SALPINGECTOMY;  Surgeon: Anastasio Auerbach, MD;  Location: [facility];  Service: Gynecology;  Laterality: Bilateral;  request to follow first case at around 10:30am.  Requests 2 1/2 hours for the case.  Marland Kitchen SALIVARY GLAND SURGERY  12-11   LEFT SIDE NODULE REMOVED WAS BENIGN  . TUBAL LIGATION      reports that she has never smoked. She has never used smokeless tobacco. She reports current alcohol use. She reports that she does not use drugs. family history includes Cancer in her mother; Colon polyps in her mother; Heart attack in her maternal grandfather; Hyperlipidemia in an other family member; Hypertension in an other family member. Allergies  Allergen Reactions  . Other     POLLEN AND DUST  . Sulfur Hives   Current Outpatient Medications on File Prior to Visit  Medication Sig  Dispense Refill  . amLODipine (NORVASC) 5 MG tablet TAKE 1 TABLET BY MOUTH ONCE DAILY 90 tablet 3  . Artificial Saliva (NEUTRASAL) PACK 2 spray up to 4 times per day as needed 120 each 5  . clonazePAM (KLONOPIN) 0.5 MG tablet TAKE 1 TABLET BY MOUTH TWICE DAILY AS NEEDED FOR ANXIETY 60 tablet 2  . estradiol (VIVELLE-DOT) 0.05 MG/24HR patch Place 1 patch (0.05 mg total) onto the skin 2 (two) times a week. 24 patch 3  . Fluocinolone Acetonide Body 0.01 % OIL APPLY OIL TOPICALLY TO AFFECTED AREA ONCE DAILY TO TWICE DAILY AS NEEDED FOR FLAKING ON SCALP    . Fluocinolone Acetonide Body 0.01 % OIL APPLY OIL TOPICALLY TO AFFECTED AREA ONCE DAILY TO TWICE DAILY AS NEEDED FOR FLAKING ON SCALP  11  . hydrochlorothiazide (MICROZIDE) 12.5 MG capsule Take 1 capsule (12.5 mg total) daily by mouth. 90 capsule 3  . ketoconazole (NIZORAL) 2 % cream Apply to affected area twice a day To affected areas of face    . ketoconazole (NIZORAL) 2 % shampoo SHAMPOO FACE AND SCALP TWICE WEEKLY. LET LATHER SIT FOR 2 TO 3 MINUTES BEFORE RINSING    . linaclotide (LINZESS) 145 MCG CAPS capsule Take 1 capsule (145 mcg total) daily by mouth. 30 capsule 11  . losartan (COZAAR) 100 MG tablet TAKE 1 TABLET BY MOUTH ONCE DAILY 90 tablet 3  . methocarbamol (ROBAXIN) 500 MG tablet Take  1 tablet (500 mg total) by mouth at bedtime as needed. 20 tablet 0  . nitrofurantoin, macrocrystal-monohydrate, (MACROBID) 100 MG capsule Take 1 capsule (100 mg total) by mouth 2 (two) times daily. 14 capsule 0  . pantoprazole (PROTONIX) 40 MG tablet Take 1 tablet (40 mg total) by mouth 2 (two) times daily. 180 tablet 3  . potassium chloride (K-DUR) 10 MEQ tablet Take 1 tablet (10 mEq total) by mouth daily. 90 tablet 3  . sertraline (ZOLOFT) 100 MG tablet Take 1 tablet (100 mg total) by mouth daily. 90 tablet 3  . [DISCONTINUED] fluticasone (FLONASE) 50 MCG/ACT nasal spray 2 sprays by Nasal route daily. 16 g 2   No current facility-administered medications  on file prior to visit.    Review of Systems  Constitutional: Negative for other unusual diaphoresis or sweats HENT: Negative for ear discharge or swelling Eyes: Negative for other worsening visual disturbances Respiratory: Negative for stridor or other swelling  Gastrointestinal: Negative for worsening distension or other blood Genitourinary: Negative for retention or other urinary change Musculoskeletal: Negative for other MSK pain or swelling Skin: Negative for color change or other new lesions Neurological: Negative for worsening tremors and other numbness  Psychiatric/Behavioral: Negative for worsening agitation or other fatigue All other system neg per pt    Objective:   Physical Exam BP (!) 142/90   Pulse 85   Temp 98.5 F (36.9 C) (Oral)   Ht 5\' 1"  (1.549 m)   Wt 184 lb (83.5 kg)   LMP 10/01/2016   SpO2 97%   BMI 34.77 kg/m  VS noted, mild ill Constitutional: Pt appears in NAD HENT: Head: NCAT.  Right Ear: External ear normal.  Left Ear: External ear normal.  Bilat tm's with mild erythema.  Max sinus areas mild tender.  Pharynx with mild erythema, no exudate Eyes: . Pupils are equal, round, and reactive to light. Conjunctivae and EOM are normal Nose: without d/c or deformity Neck: Neck supple. Gross normal ROM Cardiovascular: Normal rate and regular rhythm.   Pulmonary/Chest: Effort normal and breath sounds without rales or wheezing.  Neurological: Pt is alert. At baseline orientation, motor grossly intact Skin: Skin is warm. No rashes, other new lesions, no LE edema Psychiatric: Pt behavior is normal without agitation  No other exam findings Lab Results  Component Value Date   WBC 8.9 05/30/2018   HGB 13.8 05/30/2018   HCT 40.9 05/30/2018   PLT 244.0 05/30/2018   GLUCOSE 86 05/30/2018   CHOL 248 (H) 05/30/2018   TRIG 149.0 05/30/2018   HDL 50.30 05/30/2018   LDLDIRECT 156.0 04/17/2013   LDLCALC 168 (H) 05/30/2018   ALT 10 05/30/2018   AST 20 05/30/2018     NA 139 05/30/2018   K 3.3 (L) 05/30/2018   CL 99 05/30/2018   CREATININE 0.70 05/30/2018   BUN 11 05/30/2018   CO2 29 05/30/2018   TSH 0.57 05/30/2018        Assessment & Plan:

## 2018-09-21 ENCOUNTER — Encounter: Payer: Self-pay | Admitting: Internal Medicine

## 2018-09-29 ENCOUNTER — Telehealth: Payer: Self-pay | Admitting: Internal Medicine

## 2018-09-29 NOTE — Telephone Encounter (Signed)
Copied from [place] [phone]. Topic: General - Other >> Sep 29, 2018  2:28 PM Carolyn Stare wrote:  Pt would like to get a pneumonia shot, please verify and contact pt   Please advise if you would like for patient to receive this

## 2018-10-02 NOTE — Telephone Encounter (Signed)
Dr. Jenny Reichmann this pt is 19yrs of age. Is she a candidate to start with Prevnar13? Please advise. I will reach out to patient after instruction.

## 2018-10-02 NOTE — Telephone Encounter (Signed)
Appt scheduled

## 2018-10-02 NOTE — Telephone Encounter (Signed)
Norco for prevnar 13 as this is indicated at 63 and over

## 2018-10-02 NOTE — Telephone Encounter (Signed)
Sam- please schedule nurse visit for prenvar13. Thanks!

## 2018-10-03 ENCOUNTER — Other Ambulatory Visit: Payer: Self-pay

## 2018-10-03 ENCOUNTER — Ambulatory Visit (INDEPENDENT_AMBULATORY_CARE_PROVIDER_SITE_OTHER): Payer: BC Managed Care – PPO | Admitting: *Deleted

## 2018-10-03 ENCOUNTER — Encounter: Payer: Self-pay | Admitting: Internal Medicine

## 2018-10-03 DIAGNOSIS — Z23 Encounter for immunization: Secondary | ICD-10-CM

## 2018-10-10 ENCOUNTER — Encounter: Payer: Self-pay | Admitting: Internal Medicine

## 2018-10-10 ENCOUNTER — Ambulatory Visit: Payer: BC Managed Care – PPO | Admitting: Internal Medicine

## 2018-10-10 ENCOUNTER — Other Ambulatory Visit: Payer: Self-pay

## 2018-10-10 ENCOUNTER — Telehealth: Payer: Self-pay

## 2018-10-10 DIAGNOSIS — I10 Essential (primary) hypertension: Secondary | ICD-10-CM

## 2018-10-10 DIAGNOSIS — J4520 Mild intermittent asthma, uncomplicated: Secondary | ICD-10-CM | POA: Diagnosis not present

## 2018-10-10 DIAGNOSIS — F32A Depression, unspecified: Secondary | ICD-10-CM

## 2018-10-10 DIAGNOSIS — J45909 Unspecified asthma, uncomplicated: Secondary | ICD-10-CM | POA: Insufficient documentation

## 2018-10-10 DIAGNOSIS — F329 Major depressive disorder, single episode, unspecified: Secondary | ICD-10-CM | POA: Diagnosis not present

## 2018-10-10 MED ORDER — ALBUTEROL SULFATE HFA 108 (90 BASE) MCG/ACT IN AERS: 2.0000 | INHALATION_SPRAY | Freq: Four times a day (QID) | RESPIRATORY_TRACT | 5 refills | Status: DC | PRN

## 2018-10-10 MED ORDER — PREDNISONE 10 MG PO TABS: ORAL_TABLET | ORAL | 0 refills | Status: DC

## 2018-10-10 MED ORDER — FLUTICASONE-SALMETEROL 250-50 MCG/DOSE IN AEPB: 1.0000 | INHALATION_SPRAY | Freq: Two times a day (BID) | RESPIRATORY_TRACT | 11 refills | Status: DC

## 2018-10-10 NOTE — Assessment & Plan Note (Signed)
stable overall by history and exam, recent data reviewed with pt, and pt to continue medical treatment as before,  to f/u any worsening symptoms or concerns  

## 2018-10-10 NOTE — Patient Instructions (Signed)
Please take all new medication as prescribed - the albuterol and advair inhalers, and the prednisone as directed  You are given the work note  Please continue all other medications as before, and refills have been done if requested.  Please have the pharmacy call with any other refills you may need.  Please continue your efforts at being more active, low cholesterol diet, and weight control..  Please keep your appointments with your specialists as you may have planned

## 2018-10-10 NOTE — Telephone Encounter (Signed)
Pt screened for covid-19 symptoms  Denied fever, chills, n/v/d  Pt stated that she has been having a slight cough and a little bit of SOB with exertion, however, pt stated that this is not a new issue and have been dealing with for a while with asthma.   Pt will be seen at 2pm OV and advised no additional visitors may come with her at this time. Pt expressed understanding.

## 2018-10-10 NOTE — Assessment & Plan Note (Signed)
Mild uncontrolled, to restart albuterol prn, start advair 250 /50 bid, predpac asd, and cxr r/o infiltrates or other,  to f/u any worsening symptoms or concerns

## 2018-10-10 NOTE — Progress Notes (Signed)
Subjective:    Patient ID: [name], female    DOB: 03-29-1966, 53 y.o.   MRN: [id number]  HPI  Here with 2-3 wks onset worsening sob,doe, wheezing, culminating in having to leave work 5 days ago. Pt denies chest pain, orthopnea, PND, increased LE swelling, palpitations, dizziness or syncope.  Has run out of albuterol.   Pt denies fever, wt loss, night sweats, loss of appetite, or other constitutional symptoms No HA, sinus congestion, ST, or cough.  Pt denies new neurological symptoms such as new headache, or facial or extremity weakness or numbness   Pt denies polydipsia, polyuria   Wt Readings from Last 3 Encounters:  10/10/18 180 lb (81.6 kg)  09/18/18 184 lb (83.5 kg)  05/30/18 172 lb (78 kg)  Seeing therapist on a regular basis, Denies worsening depressive symptoms, suicidal ideation, or panic.   Past Medical History:  Diagnosis Date  . Allergic rhinitis   . Allergy   . Depression   . Hyperplastic colon polyp 10/2003  . Menorrhagia   . Obesity   . PONV (postoperative nausea and vomiting)   . RLS (restless legs syndrome)    Past Surgical History:  Procedure Laterality Date  . COLONOSCOPY    . CYSTOSCOPY N/A 10/12/2016   Procedure: CYSTOSCOPY;  Surgeon: Anastasio Auerbach, MD;  Location: [facility];  Service: Gynecology;  Laterality: N/A;  . ENDOMETRIAL ABLATION  2007   Dr. Landry Mellow at [facility]  . FOOT SURGERY    . LAPAROSCOPIC VAGINAL HYSTERECTOMY WITH SALPINGECTOMY Bilateral 10/12/2016   Procedure: LAPAROSCOPIC ASSISTED VAGINAL HYSTERECTOMY WITH SALPINGECTOMY;  Surgeon: Anastasio Auerbach, MD;  Location: [facility];  Service: Gynecology;  Laterality: Bilateral;  request to follow first case at around 10:30am.  Requests 2 1/2 hours for the case.  Marland Kitchen SALIVARY GLAND SURGERY  12-11   LEFT SIDE NODULE REMOVED WAS BENIGN  . TUBAL LIGATION      reports that she has never smoked. She has never used smokeless tobacco. She reports current alcohol use. She reports that she does not use  drugs. family history includes Cancer in her mother; Colon polyps in her mother; Heart attack in her maternal grandfather; Hyperlipidemia in an other family member; Hypertension in an other family member. Allergies  Allergen Reactions  . Other     POLLEN AND DUST  . Sulfur Hives   Current Outpatient Medications on File Prior to Visit  Medication Sig Dispense Refill  . amLODipine (NORVASC) 5 MG tablet TAKE 1 TABLET BY MOUTH ONCE DAILY 90 tablet 3  . Artificial Saliva (NEUTRASAL) PACK 2 spray up to 4 times per day as needed 120 each 5  . clonazePAM (KLONOPIN) 0.5 MG tablet TAKE 1 TABLET BY MOUTH TWICE DAILY AS NEEDED FOR ANXIETY 60 tablet 2  . estradiol (VIVELLE-DOT) 0.05 MG/24HR patch Place 1 patch (0.05 mg total) onto the skin 2 (two) times a week. 24 patch 3  . fluconazole (DIFLUCAN) 150 MG tablet 1 tab by mouth every 3 days as needed 2 tablet 1  . Fluocinolone Acetonide Body 0.01 % OIL APPLY OIL TOPICALLY TO AFFECTED AREA ONCE DAILY TO TWICE DAILY AS NEEDED FOR FLAKING ON SCALP    . Fluocinolone Acetonide Body 0.01 % OIL APPLY OIL TOPICALLY TO AFFECTED AREA ONCE DAILY TO TWICE DAILY AS NEEDED FOR FLAKING ON SCALP  11  . hydrochlorothiazide (MICROZIDE) 12.5 MG capsule Take 1 capsule (12.5 mg total) daily by mouth. 90 capsule 3  . ketoconazole (NIZORAL) 2 % cream  Apply to affected area twice a day To affected areas of face    . ketoconazole (NIZORAL) 2 % shampoo SHAMPOO FACE AND SCALP TWICE WEEKLY. LET LATHER SIT FOR 2 TO 3 MINUTES BEFORE RINSING    . linaclotide (LINZESS) 145 MCG CAPS capsule Take 1 capsule (145 mcg total) daily by mouth. 30 capsule 11  . losartan (COZAAR) 100 MG tablet TAKE 1 TABLET BY MOUTH ONCE DAILY 90 tablet 3  . methocarbamol (ROBAXIN) 500 MG tablet Take 1 tablet (500 mg total) by mouth at bedtime as needed. 20 tablet 0  . pantoprazole (PROTONIX) 40 MG tablet Take 1 tablet (40 mg total) by mouth 2 (two) times daily. 180 tablet 3  . potassium chloride (K-DUR) 10 MEQ  tablet Take 1 tablet (10 mEq total) by mouth daily. 90 tablet 3  . sertraline (ZOLOFT) 100 MG tablet Take 1 tablet (100 mg total) by mouth daily. 90 tablet 3  . [DISCONTINUED] fluticasone (FLONASE) 50 MCG/ACT nasal spray 2 sprays by Nasal route daily. 16 g 2   No current facility-administered medications on file prior to visit.    Review of Systems  Constitutional: Negative for other unusual diaphoresis or sweats HENT: Negative for ear discharge or swelling Eyes: Negative for other worsening visual disturbances Respiratory: Negative for stridor or other swelling  Gastrointestinal: Negative for worsening distension or other blood Genitourinary: Negative for retention or other urinary change Musculoskeletal: Negative for other MSK pain or swelling Skin: Negative for color change or other new lesions Neurological: Negative for worsening tremors and other numbness  Psychiatric/Behavioral: Negative for worsening agitation or other fatigue All other system neg per pt    Objective:   Physical Exam BP (!) 144/96   Pulse 80   Temp 98.3 F (36.8 C) (Oral)   Ht 5\' 1"  (1.549 m)   Wt 180 lb (81.6 kg)   LMP 10/01/2016   SpO2 98%   BMI 34.01 kg/m  VS noted,  Constitutional: Pt appears in NAD HENT: Head: NCAT.  Right Ear: External ear normal.  Left Ear: External ear normal.  Eyes: . Pupils are equal, round, and reactive to light. Conjunctivae and EOM are normal Nose: without d/c or deformity Neck: Neck supple. Gross normal ROM Cardiovascular: Normal rate and regular rhythm.   Pulmonary/Chest: Effort normal and breath sounds decreased without rales but with few wheezing.  Abd:  Soft, NT, ND, + BS, no organomegaly Neurological: Pt is alert. At baseline orientation, motor grossly intact Skin: Skin is warm. No rashes, other new lesions, no LE edema Psychiatric: Pt behavior is normal without agitation  No other exam findings Lab Results  Component Value Date   WBC 8.9 05/30/2018   HGB  13.8 05/30/2018   HCT 40.9 05/30/2018   PLT 244.0 05/30/2018   GLUCOSE 86 05/30/2018   CHOL 248 (H) 05/30/2018   TRIG 149.0 05/30/2018   HDL 50.30 05/30/2018   LDLDIRECT 156.0 04/17/2013   LDLCALC 168 (H) 05/30/2018   ALT 10 05/30/2018   AST 20 05/30/2018   NA 139 05/30/2018   K 3.3 (L) 05/30/2018   CL 99 05/30/2018   CREATININE 0.70 05/30/2018   BUN 11 05/30/2018   CO2 29 05/30/2018   TSH 0.57 05/30/2018       Assessment & Plan:

## 2018-10-21 ENCOUNTER — Encounter (HOSPITAL_COMMUNITY): Payer: Self-pay | Admitting: *Deleted

## 2018-10-21 ENCOUNTER — Emergency Department (HOSPITAL_COMMUNITY): Payer: BC Managed Care – PPO

## 2018-10-21 ENCOUNTER — Emergency Department (HOSPITAL_COMMUNITY)
Admission: EM | Admit: 2018-10-21 | Discharge: 2018-10-22 | Disposition: A | Discharge status: Home / Self Care | Payer: BC Managed Care – PPO | Attending: Emergency Medicine | Admitting: Emergency Medicine

## 2018-10-21 ENCOUNTER — Other Ambulatory Visit: Payer: Self-pay

## 2018-10-21 DIAGNOSIS — Z79899 Other long term (current) drug therapy: Secondary | ICD-10-CM | POA: Diagnosis not present

## 2018-10-21 DIAGNOSIS — E860 Dehydration: Secondary | ICD-10-CM | POA: Diagnosis not present

## 2018-10-21 DIAGNOSIS — R5381 Other malaise: Secondary | ICD-10-CM | POA: Diagnosis present

## 2018-10-21 DIAGNOSIS — I10 Essential (primary) hypertension: Secondary | ICD-10-CM | POA: Diagnosis not present

## 2018-10-21 DIAGNOSIS — R0789 Other chest pain: Secondary | ICD-10-CM | POA: Diagnosis not present

## 2018-10-21 DIAGNOSIS — R51 Headache: Secondary | ICD-10-CM | POA: Insufficient documentation

## 2018-10-21 DIAGNOSIS — R509 Fever, unspecified: Secondary | ICD-10-CM | POA: Diagnosis not present

## 2018-10-21 DIAGNOSIS — R05 Cough: Secondary | ICD-10-CM | POA: Diagnosis not present

## 2018-10-21 DIAGNOSIS — R42 Dizziness and giddiness: Secondary | ICD-10-CM | POA: Insufficient documentation

## 2018-10-21 DIAGNOSIS — R059 Cough, unspecified: Secondary | ICD-10-CM

## 2018-10-21 LAB — CBC WITH DIFFERENTIAL/PLATELET
Abs Immature Granulocytes: 0.03 10*3/uL (ref 0.00–0.07)
Basophils Absolute: 0 10*3/uL (ref 0.0–0.1)
Basophils Relative: 0 %
Eosinophils Absolute: 0 10*3/uL (ref 0.0–0.5)
Eosinophils Relative: 0 %
HCT: 36.1 % (ref 36.0–46.0)
Hemoglobin: 11.6 g/dL — ABNORMAL LOW (ref 12.0–15.0)
Immature Granulocytes: 0 %
Lymphocytes Relative: 35 %
Lymphs Abs: 3.5 10*3/uL (ref 0.7–4.0)
MCH: 30.1 pg (ref 26.0–34.0)
MCHC: 32.1 g/dL (ref 30.0–36.0)
MCV: 93.5 fL (ref 80.0–100.0)
Monocytes Absolute: 0.8 10*3/uL (ref 0.1–1.0)
Monocytes Relative: 8 %
Neutro Abs: 5.7 10*3/uL (ref 1.7–7.7)
Neutrophils Relative %: 57 %
Platelets: 213 10*3/uL (ref 150–400)
RBC: 3.86 MIL/uL — ABNORMAL LOW (ref 3.87–5.11)
RDW: 14.8 % (ref 11.5–15.5)
WBC: 10.1 10*3/uL (ref 4.0–10.5)
nRBC: 0 % (ref 0.0–0.2)

## 2018-10-21 NOTE — ED Triage Notes (Signed)
Pt reports fever "on and off for about 1 week", temps has been about 100.2. Developed dry cough yesterday. No meds for the same today

## 2018-10-21 NOTE — ED Provider Notes (Signed)
[facility] DEPT Provider Note   CSN: [id number] Arrival date & time: 10/21/18  2209    History   Chief Complaint Chief Complaint  Patient presents with  . Fever    HPI [name] is a 53 y.o. female with a h/o of HTN, allergic rhinitis, asthma,  hyperplastic colon polyp, and restless legs syndrome who presents to the Emergency Department who presents to the emergency department with a chief complaint of generalized malaise.  The patient reports a fever and chills for the last week.  T-max 100.2 when checked with an ear thermometer at home.  No treatment prior to arrival for her fever at home.  She reports she did have some shortness of breath earlier this week while she was riding her bike outside, but she does have a history of allergic rhinitis.  She had a sore throat earlier this week, which is since resolved.  She reports that she developed a nonproductive cough 2 days ago.  Cough worsened last night.  Cough seems to be worse at night.  She reports associated pain under her bilateral breast that is only present with coughing and resolves once her coughing spell resolves.  She also reports that she is developed some pain in her left shoulder blade and in her right flank.  Pain is achy and intermittent.  No known aggravating or alleviating factors.  She has also been having some diarrhea over the last few days.  She reports that she came to the ER for further evaluation tonight after she began to feel worse while laying on her couch.  She states "I was feeling woozy with nausea and a headache." She reports she has been feeling more off balance with walking since earlier today. No falls.   She denies vomiting, visual changes, abdominal pain, diaphoresis, rash, palpitations, wheezing, leg swelling, orthopnea, urinary symptoms, vaginal complaints, neck pain, numbness, or weakness.  She reports that she did have her home antihypertensive medications prior  to arrival.  She is up-to-date on her influenza vaccination and pneumococcal vaccination.  Reports that her significant other travel to [place] last week.  He has been feeling well and asymptomatic since his return.  She has had no recent travel.  No known COVID-19 exposures.  Per chart review, the patient was seen by her PCP at the beginning of March and started on levofloxacin for a URI. She was seen again on 3/24 for a slight cough and mild shortness of breath with exertion and was restarted on albuterol PRN and Advair 250/50 BID, and a Prednisone dose pak, but the patient never filled the prescription for prednisone.   [name] was evaluated in Emergency Department on 10/22/2018 for the symptoms described in the history of present illness. She was evaluated in the context of the global COVID-19 pandemic, which necessitated consideration that the patient might be at risk for infection with the SARS-CoV-2 virus that causes COVID-19. Institutional protocols and algorithms that pertain to the evaluation of patients at risk for COVID-19 are in a state of rapid change based on information released by regulatory bodies including the CDC and federal and state organizations. These policies and algorithms were followed during the patient's care in the ED.     The history is provided by the patient. No language interpreter was used.    Past Medical History:  Diagnosis Date  . Allergic rhinitis   . Allergy   . Depression   . Hyperplastic colon polyp 10/2003  .  Menorrhagia   . Obesity   . PONV (postoperative nausea and vomiting)   . RLS (restless legs syndrome)     Patient Active Problem List   Diagnosis Date Noted  . Asthma 10/10/2018  . Acute upper respiratory infection 09/18/2018  . Bad breath 05/25/2016  . Abnormal TSH 05/09/2015  . Other and unspecified ovarian cyst 05/30/2013  . Chronic constipation 04/17/2013  . Preventative health care 10/12/2010  . Hyperlipidemia 03/26/2007  .  Anxiety state 03/26/2007  . Depression 03/26/2007  . Essential hypertension 03/26/2007  . Allergic rhinitis 03/26/2007  . GERD 03/26/2007  . IBS 03/26/2007  . COLONIC POLYPS, HX OF 03/26/2007    Past Surgical History:  Procedure Laterality Date  . COLONOSCOPY    . CYSTOSCOPY N/A 10/12/2016   Procedure: CYSTOSCOPY;  Surgeon: Anastasio Auerbach, MD;  Location: [facility];  Service: Gynecology;  Laterality: N/A;  . ENDOMETRIAL ABLATION  2007   Dr. Landry Mellow at [facility]  . FOOT SURGERY    . LAPAROSCOPIC VAGINAL HYSTERECTOMY WITH SALPINGECTOMY Bilateral 10/12/2016   Procedure: LAPAROSCOPIC ASSISTED VAGINAL HYSTERECTOMY WITH SALPINGECTOMY;  Surgeon: Anastasio Auerbach, MD;  Location: [facility];  Service: Gynecology;  Laterality: Bilateral;  request to follow first case at around 10:30am.  Requests 2 1/2 hours for the case.  Marland Kitchen SALIVARY GLAND SURGERY  12-11   LEFT SIDE NODULE REMOVED WAS BENIGN  . TUBAL LIGATION       OB History    Gravida  2   Para  2   Term      Preterm      AB      Living  2     SAB      TAB      Ectopic      Multiple      Live Births               Home Medications    Prior to Admission medications   Medication Sig Start Date End Date Taking? Authorizing Provider  amLODipine (NORVASC) 5 MG tablet TAKE 1 TABLET BY MOUTH ONCE DAILY Patient taking differently: Take 5 mg by mouth daily.  09/05/18  Yes Biagio Borg, MD  clonazePAM (KLONOPIN) 0.5 MG tablet TAKE 1 TABLET BY MOUTH TWICE DAILY AS NEEDED FOR ANXIETY Patient taking differently: Take 0.5 mg by mouth 2 (two) times daily as needed for anxiety.  12/21/17  Yes Biagio Borg, MD  estradiol (VIVELLE-DOT) 0.05 MG/24HR patch Place 1 patch (0.05 mg total) onto the skin 2 (two) times a week. 04/17/18  Yes Huel Cote, NP  Fluocinolone Acetonide Body 0.01 % OIL Apply 1 application topically 2 (two) times daily as needed (flaking of scalp).  02/04/18  Yes [provider]  Fluticasone-Salmeterol  (ADVAIR DISKUS) 250-50 MCG/DOSE AEPB Inhale 1 puff into the lungs 2 (two) times daily. Patient taking differently: Inhale 1 puff into the lungs 2 (two) times daily as needed (SOB, wheezing).  10/10/18 10/10/19 Yes Biagio Borg, MD  hydrochlorothiazide (MICROZIDE) 12.5 MG capsule Take 1 capsule (12.5 mg total) daily by mouth. 05/27/17  Yes Biagio Borg, MD  ketoconazole (NIZORAL) 2 % shampoo Apply 1 application topically once a week.  12/01/17  Yes [provider]  linaclotide (LINZESS) 145 MCG CAPS capsule Take 1 capsule (145 mcg total) daily by mouth. Patient taking differently: Take 145 mcg by mouth daily as needed (constipation).  05/27/17  Yes Biagio Borg, MD  losartan (COZAAR) 100 MG tablet TAKE  1 TABLET BY MOUTH ONCE DAILY Patient taking differently: Take 100 mg by mouth daily.  04/06/18  Yes Biagio Borg, MD  pantoprazole (PROTONIX) 40 MG tablet Take 1 tablet (40 mg total) by mouth 2 (two) times daily. Patient taking differently: Take 40 mg by mouth 2 (two) times daily as needed (reflux, heartburn).  10/31/17  Yes Biagio Borg, MD  potassium chloride (K-DUR) 10 MEQ tablet Take 1 tablet (10 mEq total) by mouth daily. 05/30/18  Yes Biagio Borg, MD  sertraline (ZOLOFT) 100 MG tablet Take 1 tablet (100 mg total) by mouth daily. 05/30/18  Yes Biagio Borg, MD  promethazine-dextromethorphan (PROMETHAZINE-DM) 6.25-15 MG/5ML syrup Take 5 mLs by mouth 4 (four) times daily as needed for cough. 10/22/18   Kinlee Garrison A, PA-C  fluticasone (FLONASE) 50 MCG/ACT nasal spray 2 sprays by Nasal route daily. 10/12/10 09/27/11  Biagio Borg, MD    Family History Family History  Problem Relation Age of Onset  . Cancer Mother        colon cancer at 53 yo  . Colon polyps Mother   . Hyperlipidemia Other   . Hypertension Other   . Heart attack Maternal Grandfather   . Esophageal cancer Neg Hx   . Rectal cancer Neg Hx   . Stomach cancer Neg Hx     Social History Social History   Tobacco Use   . Smoking status: Never Smoker  . Smokeless tobacco: Never Used  Substance Use Topics  . Alcohol use: Yes    Alcohol/week: 0.0 standard drinks    Comment: some  . Drug use: No     Allergies   Other and Sulfur   Review of Systems Review of Systems  Constitutional: Positive for chills and fever. Negative for activity change and diaphoresis.  HENT: Positive for sore throat (resolved). Negative for congestion, sinus pressure and sinus pain.   Respiratory: Positive for cough and shortness of breath.   Cardiovascular: Positive for chest pain.  Gastrointestinal: Positive for diarrhea and nausea. Negative for abdominal pain, anal bleeding, blood in stool, constipation and vomiting.  Genitourinary: Positive for flank pain. Negative for dysuria, vaginal bleeding and vaginal discharge.  Musculoskeletal: Positive for back pain and myalgias. Negative for arthralgias, neck pain and neck stiffness.  Skin: Negative for rash.  Allergic/Immunologic: Negative for immunocompromised state.  Neurological: Positive for dizziness, weakness, light-headedness and headaches. Negative for syncope and numbness.  Psychiatric/Behavioral: Negative for confusion.    Physical Exam Updated Vital Signs BP (!) 143/82   Pulse 74   Temp 98.6 F (37 C) (Oral)   Resp 14   LMP 10/01/2016   SpO2 100%   Physical Exam Vitals signs and nursing note reviewed.  Constitutional:      General: She is not in acute distress.    Appearance: She is obese. She is not toxic-appearing.     Comments: Well appearing. NAD  HENT:     Head: Normocephalic.  Eyes:     General: No scleral icterus.    Conjunctiva/sclera: Conjunctivae normal.  Neck:     Musculoskeletal: Normal range of motion and neck supple. No neck rigidity or muscular tenderness.     Vascular: No carotid bruit.  Cardiovascular:     Rate and Rhythm: Normal rate and regular rhythm.     Heart sounds: No murmur. No friction rub. No gallop.   Pulmonary:      Effort: Pulmonary effort is normal. No respiratory distress.     Breath sounds:  No stridor. No wheezing, rhonchi or rales.  Chest:     Chest wall: No tenderness.  Abdominal:     General: There is no distension.     Palpations: Abdomen is soft. There is no mass.     Tenderness: There is no abdominal tenderness. There is no right CVA tenderness, left CVA tenderness, guarding or rebound.     Hernia: No hernia is present.  Musculoskeletal:     Right lower leg: No edema.     Left lower leg: No edema.  Lymphadenopathy:     Cervical: No cervical adenopathy.  Skin:    General: Skin is warm.     Capillary Refill: Capillary refill takes less than 2 seconds.     Coloration: Skin is not jaundiced.     Findings: No rash.  Neurological:     General: No focal deficit present.     Mental Status: She is alert.  Psychiatric:        Behavior: Behavior normal.     ED Treatments / Results  Labs (all labs ordered are listed, but only abnormal results are displayed) Labs Reviewed  CBC WITH DIFFERENTIAL/PLATELET - Abnormal; Notable for the following components:      Result Value   RBC 3.86 (*)    Hemoglobin 11.6 (*)    All other components within normal limits  COMPREHENSIVE METABOLIC PANEL - Abnormal; Notable for the following components:   Creatinine, Ser 1.02 (*)    All other components within normal limits  URINALYSIS, ROUTINE W REFLEX MICROSCOPIC  TROPONIN I    EKG EKG Interpretation  Date/Time:  Saturday October 21 2018 23:24:40 EDT Ventricular Rate:  72 PR Interval:    QRS Duration: 83 QT Interval:  359 QTC Calculation: 393 R Axis:   63 Text Interpretation:  Sinus rhythm No acute changes No old tracing to compare Confirmed by Varney Biles [phone]) on 10/21/2018 11:43:44 PM   Radiology Dg Chest Portable 1 View  Result Date: 10/21/2018 CLINICAL DATA:  Cough and back pain EXAM: PORTABLE CHEST 1 VIEW COMPARISON:  None. FINDINGS: The heart size and mediastinal contours are within  normal limits. Both lungs are clear. The visualized skeletal structures are unremarkable. IMPRESSION: No active disease. Electronically Signed   By: Ulyses Jarred M.D.   On: 10/21/2018 23:18    Procedures Procedures (including critical care time)  Medications Ordered in ED Medications  sodium chloride 0.9 % bolus 1,000 mL (0 mLs Intravenous Stopped 10/22/18 0159)     Initial Impression / Assessment and Plan / ED Course  I have reviewed the triage vital signs and the nursing notes.  Pertinent labs & imaging results that were available during my care of the patient were reviewed by me and considered in my medical decision making (see chart for details).        53 year old female with a h/o of allergic rhinitis, asthma,  hyperplastic colon polyp, and restless legs syndrome.  She is here with multiple complaints, but is most concerned with worsening "wooziness", lightheadedness, and dizziness since earlier today.  She also reports that she has been having some generalized malaise, fever when using a tympanic thermometer, and worsening cough.   Differential diagnosis includes seasonal allergies, asthma exacerbation, COVID-19, bacterial pneumonia, ACS, UTI, cholecystitis, bronchospastic disease, dehydration, or influenza.  With orthostatic vital signs, she had approximately a 12 point drop in systolic pressure. Creatinine is 0.70, up from her baseline of ~0.70.  I suspect the patient's symptoms today are secondary to poor p.o.  intake and mild dehydration.  Will give an IV fluid bolus and reassess.  Urinalysis is unremarkable. EKG with normal sinus rhythm.  Chest x-ray is unremarkable.  No leukocytosis.  Troponin is negative.  Work-up is otherwise reassuring.   She was endorsing an oral temp of 100.2 at home.  She has not taken any antipyretics prior to evaluation tonight.  Temperature has been normal since arrival in the ER.  I question accuracy of her home equipment.  I have a low suspicion for  COVID-19 based on the patient's evaluation in the ER today.  I suspect that in addition to mild dehydration that she has been having mild exacerbation of her asthma since cough has been nonproductive and worse at night.  The patient noted to pharmacy tech that she has been using her Advair as a rescue inhaler.  She was counseled on appropriate use of her inhalers on her discharge instructions.  The patient is well-appearing and in no acute distress.  Following fluid bolus, she was ambulated in the ER and reports that her dizziness and lightheadedness has resolved and she is feeling much better.  Safe for discharge to home with outpatient follow-up at this time.  Final Clinical Impressions(s) / ED Diagnoses   Final diagnoses:  Dehydration  Cough    ED Discharge Orders         Ordered    promethazine-dextromethorphan (PROMETHAZINE-DM) 6.25-15 MG/5ML syrup  4 times daily PRN     10/22/18 0218           Garmon Dehn A, PA-C 10/22/18 0356    Varney Biles, MD 10/22/18 0400

## 2018-10-22 LAB — COMPREHENSIVE METABOLIC PANEL
ALT: 8 U/L (ref 0–44)
AST: 15 U/L (ref 15–41)
Albumin: 4 g/dL (ref 3.5–5.0)
Alkaline Phosphatase: 49 U/L (ref 38–126)
Anion gap: 7 (ref 5–15)
BUN: 12 mg/dL (ref 6–20)
CO2: 24 mmol/L (ref 22–32)
Calcium: 9.2 mg/dL (ref 8.9–10.3)
Chloride: 106 mmol/L (ref 98–111)
Creatinine, Ser: 1.02 mg/dL — ABNORMAL HIGH (ref 0.44–1.00)
GFR calc Af Amer: >60 mL/min (ref >60)
GFR calc non Af Amer: >60 mL/min (ref >60)
Glucose, Bld: 97 mg/dL (ref 70–99)
Potassium: 3.6 mmol/L (ref 3.5–5.1)
Sodium: 137 mmol/L (ref 135–145)
Total Bilirubin: 0.4 mg/dL (ref 0.3–1.2)
Total Protein: 7.7 g/dL (ref 6.5–8.1)

## 2018-10-22 LAB — URINALYSIS, ROUTINE W REFLEX MICROSCOPIC
Bilirubin Urine: NEGATIVE
Glucose, UA: NEGATIVE mg/dL
Hgb urine dipstick: NEGATIVE
Ketones, ur: NEGATIVE mg/dL
Leukocytes,Ua: NEGATIVE
Nitrite: NEGATIVE
Protein, ur: NEGATIVE mg/dL
Specific Gravity, Urine: 1.017 (ref 1.005–1.030)
pH: 6 (ref 5.0–8.0)

## 2018-10-22 LAB — TROPONIN I: Troponin I: <0.03 ng/mL (ref <0.03)

## 2018-10-22 MED ORDER — PROMETHAZINE-DM 6.25-15 MG/5ML PO SYRP: 5.0000 mL | ORAL_SOLUTION | Freq: Four times a day (QID) | ORAL | 0 refills | Status: DC | PRN

## 2018-10-22 MED ORDER — SODIUM CHLORIDE 0.9 % IV BOLUS
1000.0000 mL | Freq: Once | INTRAVENOUS | Status: AC
  Administered 2018-10-22: 1000 mL via INTRAVENOUS

## 2018-10-22 NOTE — Discharge Instructions (Signed)
Thank you for allowing me to care for you today in the Emergency Department.   Continue your home Advair as prescribed. Use 2 puffs of your albuterol inhaler every 4 hours as needed for cough.  Do not forget: Advair is a maintenance medication. It should be used two times daily to PREVENT exacerbations.  It should never be used in an emergency if you are having shortness of breath.  This is what albuterol is for.  It is a rescue inhaler medication and should be used when you are suddenly short of breath.  Take 5 mL's of Promethazine DM every 6 hours as needed for cough.  Make sure that you are drinking at least 64 ounces of fluids daily to avoid dehydration.  Try taking your temperature on another thermometer since he did not have a fever in the ER.  A fever is a temperature above 100.5.  If you do develop a fever you should stay home and quarantine yourself for at least 72 hours after the fever has resolved without taking any medication to lower your fever.  In the low suspicion for COVID-19 based on your exam today based on your exam today.  If you do develop a fever at home, you can take it as milligrams of Tylenol once every 8 hours.  Follow-up with primary care if you continue to have a dry cough over the next few weeks.  Return to the emergency department if you develop significant shortness of breath, respiratory distress, if you stop making urine, develop a high fever that does not improve with Tylenol, or other new, concerning symptoms.

## 2018-10-22 NOTE — ED Notes (Signed)
Pt was able to ambulate without any dizziness.  Pt states that she feels a lot better.

## 2018-10-22 NOTE — ED Notes (Signed)
Pt was able to ambulate to the restroom.  

## 2018-11-02 ENCOUNTER — Other Ambulatory Visit: Payer: Self-pay | Admitting: Internal Medicine

## 2018-11-03 ENCOUNTER — Other Ambulatory Visit: Payer: Self-pay | Admitting: Internal Medicine

## 2018-11-03 NOTE — Telephone Encounter (Signed)
Done erx 

## 2018-11-13 ENCOUNTER — Ambulatory Visit (INDEPENDENT_AMBULATORY_CARE_PROVIDER_SITE_OTHER): Payer: BC Managed Care – PPO | Admitting: Internal Medicine

## 2018-11-13 ENCOUNTER — Encounter: Payer: Self-pay | Admitting: Internal Medicine

## 2018-11-13 DIAGNOSIS — R1013 Epigastric pain: Secondary | ICD-10-CM | POA: Diagnosis not present

## 2018-11-13 DIAGNOSIS — D649 Anemia, unspecified: Secondary | ICD-10-CM | POA: Insufficient documentation

## 2018-11-13 DIAGNOSIS — R1310 Dysphagia, unspecified: Secondary | ICD-10-CM | POA: Insufficient documentation

## 2018-11-13 DIAGNOSIS — K219 Gastro-esophageal reflux disease without esophagitis: Secondary | ICD-10-CM

## 2018-11-13 MED ORDER — PANTOPRAZOLE SODIUM 40 MG PO TBEC: 40.0000 mg | DELAYED_RELEASE_TABLET | Freq: Two times a day (BID) | ORAL | 3 refills | Status: DC

## 2018-11-13 MED ORDER — FAMOTIDINE 20 MG PO TABS: 20.0000 mg | ORAL_TABLET | Freq: Two times a day (BID) | ORAL | 3 refills | Status: DC

## 2018-11-13 NOTE — Patient Instructions (Signed)
Ok to restart the protonix at 40 mg twice per day  Please take all new medication as prescribed - the pepcid 20 mg twice per day  Please follow the "anti- reflux" diet (no spicy or greasy foods)  Please continue all other medications as before, and refills have been done if requested.  Please have the pharmacy call with any other refills you may need.  Please continue your efforts at being more active, low cholesterol diet, and weight control.  You are otherwise up to date with prevention measures today.  Please keep your appointments with your specialists as you may have planned  You will be contacted regarding the referral for: Abd ultrasound, and Dr Fuller Plan for possible EGD and colonoscopy soon  Please go to the LAB in the Basement (turn left off the elevator) for the tests to be done today  You will be contacted by phone if any changes need to be made immediately.  Otherwise, you will receive a letter about your results with an explanation, but please check with MyChart first.  Please remember to sign up for MyChart if you have not done so, as this will be important to you in the future with finding out test results, communicating by private email, and scheduling acute appointments online when needed.

## 2018-11-13 NOTE — Assessment & Plan Note (Signed)
Uncontrolled, suggestive of esophagitis gastritis, to restart PPI, add pepcid 20 qd,  to f/u any worsening symptoms or concerns, to check labs as ordered, refer GI

## 2018-11-13 NOTE — Progress Notes (Signed)
Patient ID: [name], female   DOB: 1965-12-17, 53 y.o.   MRN: [id number]  Virtual Visit via Video Note  I connected with [name] on 11/13/18 at  2:20 PM EDT by a video enabled telemedicine application and verified that I am speaking with the correct person using two identifiers.  Pt is at home I am at office, no others present   I discussed the limitations of evaluation and management by telemedicine and the availability of in person appointments. The patient expressed understanding and agreed to proceed.  History of Present Illness: 53yo F c/o 3 days onset marked reflux like symptoms, started with spicy and greasy foods dinner and persistent since then with intermittent dysphagia, sour brash, lower sternal and epigastric pain with intermittent nausea and tenderness to the lower bilat rib margins.  Does not take PPI daily but took 2 protonix yesterday now somebetter with bfast this am.  Seen at ED 1 wk ago with benign evaluation  Last EGD normal 2010. Due for f/u colonoscopy. Pt denies chest pain, increased sob or doe, wheezing, orthopnea, PND, increased LE swelling, palpitations, dizziness or syncope.  Pt denies new neurological symptoms such as new headache, or facial or extremity weakness or numbness   Pt denies polydipsia, polyuria Past Medical History:  Diagnosis Date  . Allergic rhinitis   . Allergy   . Depression   . Hyperplastic colon polyp 10/2003  . Menorrhagia   . Obesity   . PONV (postoperative nausea and vomiting)   . RLS (restless legs syndrome)    Past Surgical History:  Procedure Laterality Date  . COLONOSCOPY    . CYSTOSCOPY N/A 10/12/2016   Procedure: CYSTOSCOPY;  Surgeon: Anastasio Auerbach, MD;  Location: [facility];  Service: Gynecology;  Laterality: N/A;  . ENDOMETRIAL ABLATION  2007   Dr. Landry Mellow at [facility]  . FOOT SURGERY    . LAPAROSCOPIC VAGINAL HYSTERECTOMY WITH SALPINGECTOMY Bilateral 10/12/2016   Procedure: LAPAROSCOPIC ASSISTED VAGINAL HYSTERECTOMY  WITH SALPINGECTOMY;  Surgeon: Anastasio Auerbach, MD;  Location: [facility];  Service: Gynecology;  Laterality: Bilateral;  request to follow first case at around 10:30am.  Requests 2 1/2 hours for the case.  Marland Kitchen SALIVARY GLAND SURGERY  12-11   LEFT SIDE NODULE REMOVED WAS BENIGN  . TUBAL LIGATION      reports that she has never smoked. She has never used smokeless tobacco. She reports current alcohol use. She reports that she does not use drugs. family history includes Cancer in her mother; Colon polyps in her mother; Heart attack in her maternal grandfather; Hyperlipidemia in an other family member; Hypertension in an other family member. Allergies  Allergen Reactions  . Other     POLLEN AND DUST  . Sulfur Hives   Current Outpatient Medications on File Prior to Visit  Medication Sig Dispense Refill  . amLODipine (NORVASC) 5 MG tablet TAKE 1 TABLET BY MOUTH ONCE DAILY (Patient taking differently: Take 5 mg by mouth daily. ) 90 tablet 3  . clonazePAM (KLONOPIN) 0.5 MG tablet Take 1 tablet (0.5 mg total) by mouth 2 (two) times daily as needed for anxiety. 60 tablet 2  . estradiol (VIVELLE-DOT) 0.05 MG/24HR patch Place 1 patch (0.05 mg total) onto the skin 2 (two) times a week. 24 patch 3  . Fluocinolone Acetonide Body 0.01 % OIL Apply 1 application topically 2 (two) times daily as needed (flaking of scalp).   11  . Fluticasone-Salmeterol (ADVAIR DISKUS) 250-50 MCG/DOSE AEPB Inhale 1 puff  into the lungs 2 (two) times daily. (Patient taking differently: Inhale 1 puff into the lungs 2 (two) times daily as needed (SOB, wheezing). ) 1 each 11  . hydrochlorothiazide (MICROZIDE) 12.5 MG capsule Take 1 capsule (12.5 mg total) daily by mouth. 90 capsule 3  . ketoconazole (NIZORAL) 2 % shampoo Apply 1 application topically once a week.     Marland Kitchen LINZESS 145 MCG CAPS capsule Take 1 capsule by mouth once daily 30 capsule 5  . losartan (COZAAR) 100 MG tablet TAKE 1 TABLET BY MOUTH ONCE DAILY (Patient taking  differently: Take 100 mg by mouth daily. ) 90 tablet 3  . potassium chloride (K-DUR) 10 MEQ tablet Take 1 tablet (10 mEq total) by mouth daily. 90 tablet 3  . promethazine-dextromethorphan (PROMETHAZINE-DM) 6.25-15 MG/5ML syrup Take 5 mLs by mouth 4 (four) times daily as needed for cough. 118 mL 0  . sertraline (ZOLOFT) 100 MG tablet Take 1 tablet (100 mg total) by mouth daily. 90 tablet 3  . [DISCONTINUED] fluticasone (FLONASE) 50 MCG/ACT nasal spray 2 sprays by Nasal route daily. 16 g 2   No current facility-administered medications on file prior to visit.     Observations/Objective: Alert, NAD, cn 2-12 intact, moves all 4s, NAD, mentating well, mild nervous, not ill appearing but uncomfortable Lab Results  Component Value Date   WBC 10.1 10/21/2018   HGB 11.6 (L) 10/21/2018   HCT 36.1 10/21/2018   PLT 213 10/21/2018   GLUCOSE 97 10/21/2018   CHOL 248 (H) 05/30/2018   TRIG 149.0 05/30/2018   HDL 50.30 05/30/2018   LDLDIRECT 156.0 04/17/2013   LDLCALC 168 (H) 05/30/2018   ALT 8 10/21/2018   AST 15 10/21/2018   NA 137 10/21/2018   K 3.6 10/21/2018   CL 106 10/21/2018   CREATININE 1.02 (H) 10/21/2018   BUN 12 10/21/2018   CO2 24 10/21/2018   TSH 0.57 05/30/2018   Assessment and Plan: See notes  Follow Up Instructions: See notes   I discussed the assessment and treatment plan with the patient. The patient was provided an opportunity to ask questions and all were answered. The patient agreed with the plan and demonstrated an understanding of the instructions.   The patient was advised to call back or seek an in-person evaluation if the symptoms worsen or if the condition fails to improve as anticipated.   [name], MD

## 2018-11-13 NOTE — Assessment & Plan Note (Signed)
May need EGD, refer GI,  to f/u any worsening symptoms or concerns

## 2018-11-13 NOTE — Assessment & Plan Note (Signed)
Mild recent, for f/u cbc and iron studies,  to f/u any worsening symptoms or concerns

## 2018-11-13 NOTE — Assessment & Plan Note (Signed)
For labs above, also abd u/w r/o GB

## 2018-11-15 ENCOUNTER — Other Ambulatory Visit (INDEPENDENT_AMBULATORY_CARE_PROVIDER_SITE_OTHER): Payer: BC Managed Care – PPO

## 2018-11-15 ENCOUNTER — Other Ambulatory Visit: Payer: Self-pay

## 2018-11-15 ENCOUNTER — Ambulatory Visit (INDEPENDENT_AMBULATORY_CARE_PROVIDER_SITE_OTHER): Payer: BC Managed Care – PPO | Admitting: Gastroenterology

## 2018-11-15 ENCOUNTER — Encounter: Payer: Self-pay | Admitting: Gastroenterology

## 2018-11-15 VITALS — Ht 62.0 in | Wt 180.0 lb

## 2018-11-15 DIAGNOSIS — R1013 Epigastric pain: Secondary | ICD-10-CM

## 2018-11-15 DIAGNOSIS — K219 Gastro-esophageal reflux disease without esophagitis: Secondary | ICD-10-CM

## 2018-11-15 DIAGNOSIS — D649 Anemia, unspecified: Secondary | ICD-10-CM | POA: Diagnosis not present

## 2018-11-15 DIAGNOSIS — R1310 Dysphagia, unspecified: Secondary | ICD-10-CM | POA: Diagnosis not present

## 2018-11-15 DIAGNOSIS — K5904 Chronic idiopathic constipation: Secondary | ICD-10-CM | POA: Diagnosis not present

## 2018-11-15 DIAGNOSIS — Z800 Family history of malignant neoplasm of digestive organs: Secondary | ICD-10-CM | POA: Diagnosis not present

## 2018-11-15 LAB — BASIC METABOLIC PANEL
BUN: 13 mg/dL (ref 6–23)
CO2: 25 mEq/L (ref 19–32)
Calcium: 8.9 mg/dL (ref 8.4–10.5)
Chloride: 104 mEq/L (ref 96–112)
Creatinine, Ser: 0.9 mg/dL (ref 0.40–1.20)
GFR: 80.07 mL/min (ref >60.00)
Glucose, Bld: 99 mg/dL (ref 70–99)
Potassium: 3.7 mEq/L (ref 3.5–5.1)
Sodium: 139 mEq/L (ref 135–145)

## 2018-11-15 LAB — URINALYSIS, ROUTINE W REFLEX MICROSCOPIC
Bilirubin Urine: NEGATIVE
Ketones, ur: NEGATIVE
Leukocytes,Ua: NEGATIVE
Nitrite: NEGATIVE
Specific Gravity, Urine: 1.025 (ref 1.000–1.030)
Total Protein, Urine: NEGATIVE
Urine Glucose: NEGATIVE
Urobilinogen, UA: 0.2 (ref 0.0–1.0)
pH: 5.5 (ref 5.0–8.0)

## 2018-11-15 LAB — CBC WITH DIFFERENTIAL/PLATELET
Basophils Absolute: 0 10*3/uL (ref 0.0–0.1)
Basophils Relative: 0.3 % (ref 0.0–3.0)
Eosinophils Absolute: 0 10*3/uL (ref 0.0–0.7)
Eosinophils Relative: 0.3 % (ref 0.0–5.0)
HCT: 36.8 % (ref 36.0–46.0)
Hemoglobin: 12.4 g/dL (ref 12.0–15.0)
Lymphocytes Relative: 28 % (ref 12.0–46.0)
Lymphs Abs: 2.3 10*3/uL (ref 0.7–4.0)
MCHC: 33.6 g/dL (ref 30.0–36.0)
MCV: 89.8 fl (ref 78.0–100.0)
Monocytes Absolute: 0.8 10*3/uL (ref 0.1–1.0)
Monocytes Relative: 9.5 % (ref 3.0–12.0)
Neutro Abs: 5 10*3/uL (ref 1.4–7.7)
Neutrophils Relative %: 61.9 % (ref 43.0–77.0)
Platelets: 213 10*3/uL (ref 150.0–400.0)
RBC: 4.09 Mil/uL (ref 3.87–5.11)
RDW: 14.7 % (ref 11.5–15.5)
WBC: 8.1 10*3/uL (ref 4.0–10.5)

## 2018-11-15 LAB — HEPATIC FUNCTION PANEL
ALT: 5 U/L (ref 0–35)
AST: 12 U/L (ref 0–37)
Albumin: 4.3 g/dL (ref 3.5–5.2)
Alkaline Phosphatase: 48 U/L (ref 39–117)
Bilirubin, Direct: 0.1 mg/dL (ref 0.0–0.3)
Total Bilirubin: 0.5 mg/dL (ref 0.2–1.2)
Total Protein: 7.2 g/dL (ref 6.0–8.3)

## 2018-11-15 LAB — LIPASE: Lipase: 26 U/L (ref 11.0–59.0)

## 2018-11-15 LAB — VITAMIN B12: Vitamin B-12: 199 pg/mL — ABNORMAL LOW (ref 211–911)

## 2018-11-15 LAB — IBC PANEL
Iron: 91 ug/dL (ref 42–145)
Saturation Ratios: 30.2 % (ref 20.0–50.0)
Transferrin: 215 mg/dL (ref 212.0–360.0)

## 2018-11-15 NOTE — Progress Notes (Signed)
History of Present Illness: This is a 53 year old female referred by Biagio Borg, MD for the evaluation of epigastric pain, bloating, GERD. She relates frequent problems with bloating and constipation with symptoms generally controlled on Linzess.  She has GERD and takes pantoprazole daily when symptoms are active.  She had not been taking pantoprazole recently and then symptoms abruptly worsened on Friday with epigastric pain and heartburn.  Her symptoms worsen with meals.  She noted pain radiated around her right abdomen to her back on several occasions.  She increased her pantoprazole to twice daily since Friday.  She was evaluated by Dr. Jenny Reichmann via telemedicine 2 days ago and I reviewed that visit.  Dr. Jenny Reichmann added famotidine twice daily to pantoprazole twice daily.  Blood work obtained today included CMP, CBC, lipase, TSH, B12, IBC panel and was unremarkable except for a slightly low B12.  She has a history of iron deficiency anemia in 2016 however her hemoglobin and iron studies were normal today. Denies weight loss, diarrhea, change in stool caliber, melena, hematochezia, nausea, vomiting, dysphagia, chest pain.   Colonoscopy 01/2014: small internal hemorrhoids EGD 01/2009: normal    Allergies  Allergen Reactions  . Other     POLLEN AND DUST  . Sulfur Hives   Outpatient Medications Prior to Visit  Medication Sig Dispense Refill  . amLODipine (NORVASC) 5 MG tablet TAKE 1 TABLET BY MOUTH ONCE DAILY (Patient taking differently: Take 5 mg by mouth daily. ) 90 tablet 3  . clonazePAM (KLONOPIN) 0.5 MG tablet Take 1 tablet (0.5 mg total) by mouth 2 (two) times daily as needed for anxiety. 60 tablet 2  . famotidine (PEPCID) 20 MG tablet Take 1 tablet (20 mg total) by mouth 2 (two) times daily. 180 tablet 3  . Fluocinolone Acetonide Body 0.01 % OIL Apply 1 application topically 2 (two) times daily as needed (flaking of scalp).   11  . Fluticasone-Salmeterol (ADVAIR DISKUS) 250-50 MCG/DOSE AEPB  Inhale 1 puff into the lungs 2 (two) times daily. (Patient taking differently: Inhale 1 puff into the lungs 2 (two) times daily as needed (SOB, wheezing). ) 1 each 11  . hydrochlorothiazide (MICROZIDE) 12.5 MG capsule Take 1 capsule (12.5 mg total) daily by mouth. 90 capsule 3  . ketoconazole (NIZORAL) 2 % shampoo Apply 1 application topically once a week.     Marland Kitchen LINZESS 145 MCG CAPS capsule Take 1 capsule by mouth once daily 30 capsule 5  . losartan (COZAAR) 100 MG tablet TAKE 1 TABLET BY MOUTH ONCE DAILY (Patient taking differently: Take 100 mg by mouth daily. ) 90 tablet 3  . pantoprazole (PROTONIX) 40 MG tablet Take 1 tablet (40 mg total) by mouth 2 (two) times daily. 180 tablet 3  . potassium chloride (K-DUR) 10 MEQ tablet Take 1 tablet (10 mEq total) by mouth daily. 90 tablet 3  . sertraline (ZOLOFT) 100 MG tablet Take 1 tablet (100 mg total) by mouth daily. 90 tablet 3  . promethazine-dextromethorphan (PROMETHAZINE-DM) 6.25-15 MG/5ML syrup Take 5 mLs by mouth 4 (four) times daily as needed for cough. 118 mL 0  . estradiol (VIVELLE-DOT) 0.05 MG/24HR patch Place 1 patch (0.05 mg total) onto the skin 2 (two) times a week. 24 patch 3   No facility-administered medications prior to visit.    Past Medical History:  Diagnosis Date  . Allergic rhinitis   . Allergy   . Depression   . Hyperplastic colon polyp 10/2003  . Menorrhagia   .  Obesity   . PONV (postoperative nausea and vomiting)   . RLS (restless legs syndrome)    Past Surgical History:  Procedure Laterality Date  . COLONOSCOPY    . CYSTOSCOPY N/A 10/12/2016   Procedure: CYSTOSCOPY;  Surgeon: Anastasio Auerbach, MD;  Location: [facility];  Service: Gynecology;  Laterality: N/A;  . ENDOMETRIAL ABLATION  2007   Dr. Landry Mellow at [facility]  . FOOT SURGERY    . LAPAROSCOPIC VAGINAL HYSTERECTOMY WITH SALPINGECTOMY Bilateral 10/12/2016   Procedure: LAPAROSCOPIC ASSISTED VAGINAL HYSTERECTOMY WITH SALPINGECTOMY;  Surgeon: Anastasio Auerbach, MD;   Location: [facility];  Service: Gynecology;  Laterality: Bilateral;  request to follow first case at around 10:30am.  Requests 2 1/2 hours for the case.  Marland Kitchen SALIVARY GLAND SURGERY  12-11   LEFT SIDE NODULE REMOVED WAS BENIGN  . TUBAL LIGATION     Social History   Socioeconomic History  . Marital status: Divorced    Spouse name: Not on file  . Number of children: 2  . Years of education: Not on file  . Highest education level: Not on file  Occupational History  . Occupation: International aid/development worker: Health visitor    Employer: [employer]  Social Needs  . Financial resource strain: Not on file  . Food insecurity:    Worry: Not on file    Inability: Not on file  . Transportation needs:    Medical: Not on file    Non-medical: Not on file  Tobacco Use  . Smoking status: Never Smoker  . Smokeless tobacco: Never Used  Substance and Sexual Activity  . Alcohol use: Yes    Alcohol/week: 0.0 standard drinks    Comment: some  . Drug use: No  . Sexual activity: Yes    Birth control/protection: Surgical, Condom    Comment: tubal ligation,INTERCOUSRE AGE 23, SEXUAL PARTNERS QUESTION DECLINED  Lifestyle  . Physical activity:    Days per week: Not on file    Minutes per session: Not on file  . Stress: Not on file  Relationships  . Social connections:    Talks on phone: Not on file    Gets together: Not on file    Attends religious service: Not on file    Active member of club or organization: Not on file    Attends meetings of clubs or organizations: Not on file    Relationship status: Not on file  Other Topics Concern  . Not on file  Social History Narrative   Patient does not get regular exercise   Family History  Problem Relation Age of Onset  . Cancer Mother        colon cancer at 55 yo  . Colon polyps Mother   . Hyperlipidemia Other   . Hypertension Other   . Heart attack Maternal Grandfather   . Esophageal cancer Neg Hx   . Rectal cancer Neg Hx   .  Stomach cancer Neg Hx       Review of Systems: Pertinent positive and negative review of systems were noted in the above HPI section. All other review of systems were otherwise negative.    Physical Exam: Telemedicine - not performed    Assessment and Recommendations:  1. Epigastric pain. GERD.  Rule out cholelithiasis, ulcer, esophagitis.  Continue pantoprazole 40 mg twice daily and famotidine 20 mg twice daily for now.  Closely follow antireflux measures.  Tums as needed.  Avoid NSAIDs for now.  Abdominal ultrasound  scheduled on 5/7.  If symptoms do not significantly improve over the next several days will proceed with EGD.   2. Family history of colon cancer.  She is due for a 5-year interval screening colonoscopy in July 2020.  3. Constipation. Continue Linzess.    These services were provided via telemedicine, audio only was used after attempts to connect with audio and video were not successful.  The patient was at home and the provider was in the office, alone.  We discussed the limitations of evaluation and management by telemedicine and the availability of in person appointments.  Patient consented for this telemedicine visit and is aware of possible charges for this service.  The other person participating in the telemedicine service was Marlon Pel, [place] who reviewed medications, allergies, past history and completed AVS.  Time spent on call: 8 minutes   cc: Biagio Borg, MD [address] [place], [place] [address]

## 2018-11-15 NOTE — Patient Instructions (Signed)
Continue your pantoprazole and famotidine twice daily.  Patient advised to avoid spicy, acidic, citrus, chocolate, mints, fruit and fruit juices.  Limit the intake of caffeine, alcohol and Soda.  Don't exercise too soon after eating.  Don't lie down within 3-4 hours of eating.  Elevate the head of your bed.  Continue your appointment for your abdominal ultrasound on 11/23/18.

## 2018-11-23 ENCOUNTER — Other Ambulatory Visit: Payer: Self-pay

## 2018-11-23 ENCOUNTER — Ambulatory Visit
Admission: RE | Admit: 2018-11-23 | Discharge: 2018-11-23 | Disposition: A | Discharge status: Home / Self Care | Payer: BC Managed Care – PPO | Source: Ambulatory Visit | Attending: Internal Medicine | Admitting: Internal Medicine

## 2018-11-23 ENCOUNTER — Telehealth: Payer: Self-pay | Admitting: Gastroenterology

## 2018-11-23 DIAGNOSIS — K219 Gastro-esophageal reflux disease without esophagitis: Secondary | ICD-10-CM

## 2018-11-23 DIAGNOSIS — R1013 Epigastric pain: Secondary | ICD-10-CM

## 2018-11-23 DIAGNOSIS — D649 Anemia, unspecified: Secondary | ICD-10-CM

## 2018-11-23 DIAGNOSIS — R1310 Dysphagia, unspecified: Secondary | ICD-10-CM

## 2018-11-23 NOTE — Telephone Encounter (Signed)
Patient had a CT scan today and her results went to her PCP. She would like for Dr. Fuller Plan to also review them if he can and see what he recommends. She did have a Virtual Visit 4-29 with Dr. Fuller Plan

## 2018-11-23 NOTE — Telephone Encounter (Signed)
Pt called and wanted to let Dr. Fuller Plan know that she had her US done and the results are in epic.

## 2018-11-24 NOTE — Telephone Encounter (Signed)
[place] reviewed showed a possible small GB polyp which would be asymptomatic. Repeat RUQ [place] in 1 year to check polyp if not already ordered by her PCP.  If her GI symptoms are not resolving schedule EGD for further evaluation. See my recent office note.   MS

## 2018-11-24 NOTE — Telephone Encounter (Signed)
Spoke with patient, notified patient to check back with the office if her symptoms are not resolving for possible scheduling of EGD for further evaluations. Also notified the patient of repeat RUQ [place] in 1 year to check the polyp. Patient verbalized understanding and had no additional questions at the time of the call.

## 2018-12-21 ENCOUNTER — Ambulatory Visit
Admission: RE | Admit: 2018-12-21 | Discharge: 2018-12-21 | Disposition: A | Discharge status: Home / Self Care | Payer: BC Managed Care – PPO | Source: Ambulatory Visit | Attending: Internal Medicine | Admitting: Internal Medicine

## 2018-12-21 ENCOUNTER — Other Ambulatory Visit: Payer: Self-pay

## 2018-12-21 ENCOUNTER — Ambulatory Visit: Payer: Self-pay | Admitting: Emergency Medicine

## 2018-12-21 ENCOUNTER — Encounter: Payer: Self-pay | Admitting: Emergency Medicine

## 2018-12-21 VITALS — BP 124/76 | HR 90 | Temp 98.7°F | Resp 16 | Ht 62.0 in | Wt 182.0 lb

## 2018-12-21 DIAGNOSIS — Z024 Encounter for examination for driving license: Secondary | ICD-10-CM

## 2018-12-21 DIAGNOSIS — Z1231 Encounter for screening mammogram for malignant neoplasm of breast: Secondary | ICD-10-CM

## 2018-12-21 NOTE — Patient Instructions (Addendum)
If you have lab work done today you will be contacted with your lab results within the next 2 weeks.  If you have not heard from [place] then please contact us. The fastest way to get your results is to register for My Chart.   IF you received an x-ray today, you will receive an invoice from [facility]. Please contact [facility] at [phone] with questions or concerns regarding your invoice.   IF you received labwork today, you will receive an invoice from [facility]. Please contact [facility] at [phone] with questions or concerns regarding your invoice.   Our billing staff will not be able to assist you with questions regarding bills from these companies.  You will be contacted with the lab results as soon as they are available. The fastest way to get your results is to activate your My Chart account. Instructions are located on the last page of this paperwork. If you have not heard from [place] regarding the results in 2 weeks, please contact this office.    Health Maintenance, Female Adopting a healthy lifestyle and getting preventive care can go a long way to promote health and wellness. Talk with your health care provider about what schedule of regular examinations is right for you. This is a good chance for you to check in with your provider about disease prevention and staying healthy. In between checkups, there are plenty of things you can do on your own. Experts have done a lot of research about which lifestyle changes and preventive measures are most likely to keep you healthy. Ask your health care provider for more information. Weight and diet Eat a healthy diet  Be sure to include plenty of vegetables, fruits, low-fat dairy products, and lean protein.  Do not eat a lot of foods high in solid fats, added sugars, or salt.  Get regular exercise. This is one of the most important things you can do for your health. ? Most adults should exercise for at least 150  minutes each week. The exercise should increase your heart rate and make you sweat (moderate-intensity exercise). ? Most adults should also do strengthening exercises at least twice a week. This is in addition to the moderate-intensity exercise. Maintain a healthy weight  Body mass index (BMI) is a measurement that can be used to identify possible weight problems. It estimates body fat based on height and weight. Your health care provider can help determine your BMI and help you achieve or maintain a healthy weight.  For females 12 years of age and older: ? A BMI below 18.5 is considered underweight. ? A BMI of 18.5 to 24.9 is normal. ? A BMI of 25 to 29.9 is considered overweight. ? A BMI of 30 and above is considered obese. Watch levels of cholesterol and blood lipids  You should start having your blood tested for lipids and cholesterol at 51 years of age, then have this test every 5 years.  You may need to have your cholesterol levels checked more often if: ? Your lipid or cholesterol levels are high. ? You are older than 51 years of age. ? You are at high risk for heart disease. Cancer screening Lung Cancer  Lung cancer screening is recommended for adults 58-36 years old who are at high risk for lung cancer because of a history of smoking.  A yearly low-dose CT scan of the lungs is recommended for people who: ? Currently smoke. ? Have quit within the past 15  years. ? Have at least a 30-pack-year history of smoking. A pack year is smoking an average of one pack of cigarettes a day for 1 year.  Yearly screening should continue until it has been 15 years since you quit.  Yearly screening should stop if you develop a health problem that would prevent you from having lung cancer treatment. Breast Cancer  Practice breast self-awareness. This means understanding how your breasts normally appear and feel.  It also means doing regular breast self-exams. Let your health care provider  know about any changes, no matter how small.  If you are in your 20s or 30s, you should have a clinical breast exam (CBE) by a health care provider every 1-3 years as part of a regular health exam.  If you are 3 or older, have a CBE every year. Also consider having a breast X-ray (mammogram) every year.  If you have a family history of breast cancer, talk to your health care provider about genetic screening.  If you are at high risk for breast cancer, talk to your health care provider about having an MRI and a mammogram every year.  Breast cancer gene (BRCA) assessment is recommended for women who have family members with BRCA-related cancers. BRCA-related cancers include: ? Breast. ? Ovarian. ? Tubal. ? Peritoneal cancers.  Results of the assessment will determine the need for genetic counseling and BRCA1 and BRCA2 testing. Cervical Cancer Your health care provider may recommend that you be screened regularly for cancer of the pelvic organs (ovaries, uterus, and vagina). This screening involves a pelvic examination, including checking for microscopic changes to the surface of your cervix (Pap test). You may be encouraged to have this screening done every 3 years, beginning at age 63.  For women ages 71-65, health care providers may recommend pelvic exams and Pap testing every 3 years, or they may recommend the Pap and pelvic exam, combined with testing for human papilloma virus (HPV), every 5 years. Some types of HPV increase your risk of cervical cancer. Testing for HPV may also be done on women of any age with unclear Pap test results.  Other health care providers may not recommend any screening for nonpregnant women who are considered low risk for pelvic cancer and who do not have symptoms. Ask your health care provider if a screening pelvic exam is right for you.  If you have had past treatment for cervical cancer or a condition that could lead to cancer, you need Pap tests and  screening for cancer for at least 20 years after your treatment. If Pap tests have been discontinued, your risk factors (such as having a new sexual partner) need to be reassessed to determine if screening should resume. Some women have medical problems that increase the chance of getting cervical cancer. In these cases, your health care provider may recommend more frequent screening and Pap tests. Colorectal Cancer  This type of cancer can be detected and often prevented.  Routine colorectal cancer screening usually begins at 51 years of age and continues through 51 years of age.  Your health care provider may recommend screening at an earlier age if you have risk factors for colon cancer.  Your health care provider may also recommend using home test kits to check for hidden blood in the stool.  A small camera at the end of a tube can be used to examine your colon directly (sigmoidoscopy or colonoscopy). This is done to check for the earliest forms of colorectal cancer.  Routine screening usually begins at age 50.  Direct examination of the colon should be repeated every 5-10 years through 51 years of age. However, you may need to be screened more often if early forms of precancerous polyps or small growths are found. Skin Cancer  Check your skin from head to toe regularly.  Tell your health care provider about any new moles or changes in moles, especially if there is a change in a mole's shape or color.  Also tell your health care provider if you have a mole that is larger than the size of a pencil eraser.  Always use sunscreen. Apply sunscreen liberally and repeatedly throughout the day.  Protect yourself by wearing long sleeves, pants, a wide-brimmed hat, and sunglasses whenever you are outside. Heart disease, diabetes, and high blood pressure  High blood pressure causes heart disease and increases the risk of stroke. High blood pressure is more likely to develop in: ? People who  have blood pressure in the high end of the normal range (130-139/85-89 mm Hg). ? People who are overweight or obese. ? People who are African American.  If you are 18-39 years of age, have your blood pressure checked every 3-5 years. If you are 40 years of age or older, have your blood pressure checked every year. You should have your blood pressure measured twice-once when you are at a hospital or clinic, and once when you are not at a hospital or clinic. Record the average of the two measurements. To check your blood pressure when you are not at a hospital or clinic, you can use: ? An automated blood pressure machine at a pharmacy. ? A home blood pressure monitor.  If you are between 55 years and 79 years old, ask your health care provider if you should take aspirin to prevent strokes.  Have regular diabetes screenings. This involves taking a blood sample to check your fasting blood sugar level. ? If you are at a normal weight and have a low risk for diabetes, have this test once every three years after 51 years of age. ? If you are overweight and have a high risk for diabetes, consider being tested at a younger age or more often. Preventing infection Hepatitis B  If you have a higher risk for hepatitis B, you should be screened for this virus. You are considered at high risk for hepatitis B if: ? You were born in a country where hepatitis B is common. Ask your health care provider which countries are considered high risk. ? Your parents were born in a high-risk country, and you have not been immunized against hepatitis B (hepatitis B vaccine). ? You have HIV or AIDS. ? You use needles to inject street drugs. ? You live with someone who has hepatitis B. ? You have had sex with someone who has hepatitis B. ? You get hemodialysis treatment. ? You take certain medicines for conditions, including cancer, organ transplantation, and autoimmune conditions. Hepatitis C  Blood testing is  recommended for: ? Everyone born from 1945 through 1965. ? Anyone with known risk factors for hepatitis C. Sexually transmitted infections (STIs)  You should be screened for sexually transmitted infections (STIs) including gonorrhea and chlamydia if: ? You are sexually active and are younger than 51 years of age. ? You are older than 51 years of age and your health care provider tells you that you are at risk for this type of infection. ? Your sexual activity has changed since you   were last screened and you are at an increased risk for chlamydia or gonorrhea. Ask your health care provider if you are at risk.  If you do not have HIV, but are at risk, it may be recommended that you take a prescription medicine daily to prevent HIV infection. This is called pre-exposure prophylaxis (PrEP). You are considered at risk if: ? You are sexually active and do not regularly use condoms or know the HIV status of your partner(s). ? You take drugs by injection. ? You are sexually active with a partner who has HIV. Talk with your health care provider about whether you are at high risk of being infected with HIV. If you choose to begin PrEP, you should first be tested for HIV. You should then be tested every 3 months for as long as you are taking PrEP. Pregnancy  If you are premenopausal and you may become pregnant, ask your health care provider about preconception counseling.  If you may become pregnant, take 400 to 800 micrograms (mcg) of folic acid every day.  If you want to prevent pregnancy, talk to your health care provider about birth control (contraception). Osteoporosis and menopause  Osteoporosis is a disease in which the bones lose minerals and strength with aging. This can result in serious bone fractures. Your risk for osteoporosis can be identified using a bone density scan.  If you are 65 years of age or older, or if you are at risk for osteoporosis and fractures, ask your health care  provider if you should be screened.  Ask your health care provider whether you should take a calcium or vitamin D supplement to lower your risk for osteoporosis.  Menopause may have certain physical symptoms and risks.  Hormone replacement therapy may reduce some of these symptoms and risks. Talk to your health care provider about whether hormone replacement therapy is right for you. Follow these instructions at home:  Schedule regular health, dental, and eye exams.  Stay current with your immunizations.  Do not use any tobacco products including cigarettes, chewing tobacco, or electronic cigarettes.  If you are pregnant, do not drink alcohol.  If you are breastfeeding, limit how much and how often you drink alcohol.  Limit alcohol intake to no more than 1 drink per day for nonpregnant women. One drink equals 12 ounces of beer, 5 ounces of wine, or 1 ounces of hard liquor.  Do not use street drugs.  Do not share needles.  Ask your health care provider for help if you need support or information about quitting drugs.  Tell your health care provider if you often feel depressed.  Tell your health care provider if you have ever been abused or do not feel safe at home. This information is not intended to replace advice given to you by your health care provider. Make sure you discuss any questions you have with your health care provider. Document Released: 01/18/2011 Document Revised: 12/11/2015 Document Reviewed: 04/08/2015 [employer]vier Interactive Patient Education  2019 [employer]  

## 2018-12-21 NOTE — Progress Notes (Signed)
BP Readings from Last 3 Encounters:  10/22/18 (!) 143/82  10/10/18 (!) 144/96  09/18/18 (!) 142/90       This patient presents for DOT examination for fitness for duty.   Medical History:  1. Head/Brain Injuries, disorders or illnesses no  2. Seizures, epilepsy no  3. Eye disorders or impaired vision (except corrective lenses) no  4. Ear disorders, loss of hearing or balance no  5. Heart disease or heart attack, other cardiovascular condition no  6. Heart surgery (valve replacement/bypass, angioplasty, pacemaker/defribrillator) no  7. High blood pressure yes  8. High cholesterol yes  9. Chronic cough, shortness of breath or other breathing problems no  10. Lung disease (emphysema, asthma or chronic bronchitis) no  11. Kidney disease, dialysis no  12. Digestive problems  yes  13. Diabetes or elevated blood sugar no                      If yes to #13, Insulin use n/a  14. Nervious or psychiatric disorders, e.g., severe depression no  15. Fainting or syncope no  16. Dizziness, headaches, numbness, tingling or memory loss no  17. Unexplained weight loss no  18. Stroke, TIA or paralysis no  19. Missing or impaired hand, arm, foot, leg, finger, toe no  20. Spinal injury or disease no  21. Bone, muscles or nerve problems no  22. Blood clots or bleeding bleeding disorders no  23. Cancer no  24. Chronic infection or other chronic diseases no  25. Sleep disorders, pauses in breathing while asleep, daytime sleepiness, loud snoring no  26. Have you ever had a sleep test? yes  27.  Have you ever spent a night in the hospital? yes  28. Have you ever had a broken bone? yes  29. Have you or or do you use tobacco products? no  30. Regular, frequent alcohol use no  31. Illegal substance use within the past 2 years no  32.  Have you ever failed a drug test or been dependent on an illegal substance? no   Current Medications: Prior to Admission medications   Medication Sig Start Date End  Date Taking? Authorizing Provider  amLODipine (NORVASC) 5 MG tablet TAKE 1 TABLET BY MOUTH ONCE DAILY Patient taking differently: Take 5 mg by mouth daily.  09/05/18  Yes Biagio Borg, MD  clonazePAM (KLONOPIN) 0.5 MG tablet Take 1 tablet (0.5 mg total) by mouth 2 (two) times daily as needed for anxiety. 11/03/18  Yes Biagio Borg, MD  famotidine (PEPCID) 20 MG tablet Take 1 tablet (20 mg total) by mouth 2 (two) times daily. 11/13/18  Yes Biagio Borg, MD  Fluocinolone Acetonide Body 0.01 % OIL Apply 1 application topically 2 (two) times daily as needed (flaking of scalp).  02/04/18  Yes [provider]  Fluticasone-Salmeterol (ADVAIR DISKUS) 250-50 MCG/DOSE AEPB Inhale 1 puff into the lungs 2 (two) times daily. Patient taking differently: Inhale 1 puff into the lungs 2 (two) times daily as needed (SOB, wheezing).  10/10/18 10/10/19 Yes Biagio Borg, MD  hydrochlorothiazide (MICROZIDE) 12.5 MG capsule Take 1 capsule (12.5 mg total) daily by mouth. 05/27/17  Yes Biagio Borg, MD  ketoconazole (NIZORAL) 2 % shampoo Apply 1 application topically once a week.  12/01/17  Yes [provider]  LINZESS 145 MCG CAPS capsule Take 1 capsule by mouth once daily 11/03/18  Yes Biagio Borg, MD  losartan (COZAAR) 100 MG tablet TAKE 1 TABLET  BY MOUTH ONCE DAILY Patient taking differently: Take 100 mg by mouth daily.  04/06/18  Yes Biagio Borg, MD  pantoprazole (PROTONIX) 40 MG tablet Take 1 tablet (40 mg total) by mouth 2 (two) times daily. 11/13/18  Yes Biagio Borg, MD  potassium chloride (K-DUR) 10 MEQ tablet Take 1 tablet (10 mEq total) by mouth daily. 05/30/18  Yes Biagio Borg, MD  sertraline (ZOLOFT) 100 MG tablet Take 1 tablet (100 mg total) by mouth daily. 05/30/18  Yes Biagio Borg, MD  fluticasone [facility] 50 MCG/ACT nasal spray 2 sprays by Nasal route daily. 10/12/10 09/27/11  Biagio Borg, MD    Medical Examiner's Comments on Health History:  In good general medical condition.   Stable and well-controlled chronic medical problems.  TESTING:   Visual Acuity Screening   Right eye Left eye Both eyes  Without correction:     With correction: 20/25 20/25 20/20   Comments: Colors: 6/6 Periph:  R eye 85 degrees   L eye 85 degrees  Hearing Screening Comments: Whisper test:  R ear  10 ft   L ear 10 ft  Monocular Vision: No.  Hearing Aid used for test: No. Hearing Aid required to to meet standard: No.  BP 124/76   Pulse 90   Temp 98.7 F (37.1 C) (Oral)   Resp 16   Ht 5\' 2"  (1.575 m)   Wt 182 lb (82.6 kg)   LMP 10/01/2016   SpO2 98%   BMI 33.29 kg/m  Pulse rate is regular  Comments: u/a: protein - trace, blood - negative, spg - 1.025, glucose - negative  PHYSICAL EXAMINATION:  General Appearance Not markedly obese. No tremor, signs of alcoholism, problem drinking or drug abuse.   Skin Warm, dry and intact.  Eyes Pupils are equal, round and reactive to light and accommodation, extraocular movements are intact. No exophthalmos, no nystagmus.   Ears Normal external ears. External canal without occlusion. No scarring of the TM. No perforation of the TM.  Mouth and Throat Clear and moist. No irremedial deformities likely to interfere with breathing or swallowing.  Heart No murmurs, extra sounds, evidence of cardiomegaly. No pacemaker. No implantable defibrillator.  Lungs and Chest (excluding breasts) Normal chest expansion, respiratory rate, breath sounds. No cyanosis.  Abdomen and Viscera No liver enlargement. No splenic enlargement. No masses, bruits, hernias or significant abdominal wall weakness.  Genitourinary  No inguinal or femoral hernia.  Spine and other musculoskeletal No tenderness, no limitation of motion, no deformities. No evidence of previous surgery.  Extremities No loss or impairment of leg, foot, toe, arm, hand, finger. No perceptible limp, deformities, atrophy, weakness, paralysis, clubbing, edema, hypotonia. Patient has sufficient grasp and  prehension to maintain steering wheel grip. Patient has sufficient mobility and strength in the lower limbs to operate pedals properly.  Neurologic Normal equilibrium, coordination, speech pattern. No paresthesia, asymmetry of deep tendon reflexes, sensory or positional abnormalities. No abnormality of patellar or Babinski's reflexes.  Gait Not antalgic or ataxic  Vascular Normal pulses. No carotid or arterial bruits. No varicose veins.    Certification Status:  Meets standards, but periodic monitoring required due to: HTN  Driver qualified only for: 1 year     Certification expires 12/21/2019.

## 2018-12-22 ENCOUNTER — Encounter: Payer: Self-pay | Admitting: Gastroenterology

## 2019-01-17 ENCOUNTER — Other Ambulatory Visit: Payer: Self-pay | Admitting: Internal Medicine

## 2019-01-17 NOTE — Telephone Encounter (Signed)
Ok OV if no relief

## 2019-01-17 NOTE — Telephone Encounter (Signed)
Ok to Rf Diflucan?

## 2019-01-23 ENCOUNTER — Ambulatory Visit (AMBULATORY_SURGERY_CENTER): Payer: Self-pay

## 2019-01-23 ENCOUNTER — Other Ambulatory Visit: Payer: Self-pay

## 2019-01-23 VITALS — Ht 62.0 in | Wt 182.0 lb

## 2019-01-23 DIAGNOSIS — Z800 Family history of malignant neoplasm of digestive organs: Secondary | ICD-10-CM

## 2019-01-23 MED ORDER — NA SULFATE-K SULFATE-MG SULF 17.5-3.13-1.6 GM/177ML PO SOLN: 1.0000 | Freq: Once | ORAL | 0 refills | Status: AC

## 2019-01-23 NOTE — Progress Notes (Signed)
Denies allergies to eggs or soy products. Denies complication of anesthesia or sedation. Denies use of weight loss medication. Denies use of O2.   Emmi instructions given for colonoscopy.  Pre- Visit was conducted in person. Instructions were reviewed and given to the patient. All forms were signed.

## 2019-01-24 ENCOUNTER — Encounter: Payer: Self-pay | Admitting: Gastroenterology

## 2019-02-05 ENCOUNTER — Telehealth: Payer: Self-pay | Admitting: Gastroenterology

## 2019-02-05 NOTE — Telephone Encounter (Signed)
Returned patient's call. She states she last ate last night around 2200, woke up at 0430 this am with stomach pain, took protonix with increased pain and diarrhea per pt. She states she has not been able to eat or drink any fluids today because this increases her stomach pain. She states she's had this pain at times, comes and goes, last episode was 2-3 weeks ago per patient. She denies any fever, SOB, or dry cough. She is scheduled for colonoscopy tomorrow and would like to know if she can also have an endoscopy at the same time. She has not started her prep yet. Encouraged patient to try to drink some fluids and that I will route a message to Dr. Fuller Plan and call her back. Pt. Verbalizes understanding.

## 2019-02-05 NOTE — Telephone Encounter (Signed)
OK to schedule EGD tomorrow at time of colonoscopy.

## 2019-02-05 NOTE — Telephone Encounter (Signed)
Spoke with patient regarding Covid-19 screening questions °Covid-19 Screening Questions: ° °Do you now or have you had a fever in the last 14 days? no ° °Do you have any respiratory symptoms of shortness of breath or  °cough now or in the last 14 days? No   ° °Do you have any family members or close contacts with diagnosed or suspected Covid-19 in the past 14 days? no ° °Have you been tested for Covid-19 and found to be positive?  no ° °Pt made aware of that care partner may wait in the car or come up to the lobby during the procedure but will need to provide their own mask. °

## 2019-02-05 NOTE — Telephone Encounter (Signed)
Returned patient's call and informed her that Dr. Fuller Plan wanted to go ahead and proceed with the colonoscopy. He is happy to perform the EGD as well. The EGD will need to be cleared with your insurance company. I informed Dr. Lynne Leader office staff and one of his staff will call you today to let you know if you are cleared for the EGD tomorrow. Patient verbalizes understanding.

## 2019-02-05 NOTE — Telephone Encounter (Signed)
Patient said that she having diarrhea and is scheduled to have a procedure tomorrow. She currently has not started the prep. She would like to know if she can also do an endo tomorrow with her colon.

## 2019-02-06 ENCOUNTER — Other Ambulatory Visit: Payer: Self-pay

## 2019-02-06 ENCOUNTER — Encounter: Payer: Self-pay | Admitting: Gastroenterology

## 2019-02-06 ENCOUNTER — Ambulatory Visit (AMBULATORY_SURGERY_CENTER): Payer: BC Managed Care – PPO | Admitting: Gastroenterology

## 2019-02-06 VITALS — BP 153/100 | HR 66 | Temp 98.2°F | Resp 17

## 2019-02-06 DIAGNOSIS — Z1211 Encounter for screening for malignant neoplasm of colon: Secondary | ICD-10-CM

## 2019-02-06 DIAGNOSIS — D125 Benign neoplasm of sigmoid colon: Secondary | ICD-10-CM

## 2019-02-06 DIAGNOSIS — R1013 Epigastric pain: Secondary | ICD-10-CM

## 2019-02-06 DIAGNOSIS — K635 Polyp of colon: Secondary | ICD-10-CM

## 2019-02-06 DIAGNOSIS — Z800 Family history of malignant neoplasm of digestive organs: Secondary | ICD-10-CM | POA: Diagnosis not present

## 2019-02-06 MED ORDER — DICYCLOMINE HCL 10 MG PO CAPS: 10.0000 mg | ORAL_CAPSULE | Freq: Three times a day (TID) | ORAL | 3 refills | Status: DC

## 2019-02-06 NOTE — Op Note (Signed)
[place] Patient Name: [name] Procedure Date: 02/06/2019 9:06 AM MRN: [id number] Endoscopist: Ladene Artist , MD Age: 53 Referring MD:  Date of Birth: 1966/02/20 Gender: Female Account #: [id number] Procedure:                Colonoscopy Indications:              Screening in patient at increased risk: Family                            history of 1st-degree relative with colorectal                            cancer before age 71 years Medicines:                Monitored Anesthesia Care Procedure:                Pre-Anesthesia Assessment:                           - Prior to the procedure, a History and Physical                            was performed, and patient medications and                            allergies were reviewed. The patient's tolerance of                            previous anesthesia was also reviewed. The risks                            and benefits of the procedure and the sedation                            options and risks were discussed with the patient.                            All questions were answered, and informed consent                            was obtained. Prior Anticoagulants: The patient has                            taken no previous anticoagulant or antiplatelet                            agents. ASA Grade Assessment: II - A patient with                            mild systemic disease. After reviewing the risks                            and benefits, the patient was deemed in  satisfactory condition to undergo the procedure.                           After obtaining informed consent, the colonoscope                            was passed under direct vision. Throughout the                            procedure, the patient's blood pressure, pulse, and                            oxygen saturations were monitored continuously. The                            Colonoscope was introduced through the  anus and                            advanced to the the cecum, identified by                            appendiceal orifice and ileocecal valve. The                            ileocecal valve, appendiceal orifice, and rectum                            were photographed. The quality of the bowel                            preparation was good. The colonoscopy was performed                            without difficulty. The patient tolerated the                            procedure well. Scope In: 9:23:57 AM Scope Out: 9:37:26 AM Scope Withdrawal Time: 0 hours 10 minutes 18 seconds  Total Procedure Duration: 0 hours 13 minutes 29 seconds  Findings:                 The perianal and digital rectal examinations were                            normal.                           A 6 mm polyp was found in the sigmoid colon. The                            polyp was sessile. The polyp was removed with a                            cold snare. Resection and retrieval were complete.  The exam was otherwise without abnormality on                            direct and retroflexion views. Complications:            No immediate complications. Estimated blood loss:                            None. Estimated Blood Loss:     Estimated blood loss: none. Impression:               - One 6 mm polyp in the sigmoid colon, removed with                            a cold snare. Resected and retrieved.                           - The examination was otherwise normal on direct                            and retroflexion views. Recommendation:           - Repeat colonoscopy in 5 years for surveillance.                           - Patient has a contact number available for                            emergencies. The signs and symptoms of potential                            delayed complications were discussed with the                            patient. Return to normal activities tomorrow.                             Written discharge instructions were provided to the                            patient.                           - Resume previous diet.                           - Continue present medications.                           - Await pathology results. Ladene Artist, MD 02/06/2019 9:45:05 AM This report has been signed electronically.

## 2019-02-06 NOTE — Progress Notes (Signed)
Report to PACU, RN, vss, BBS= Clear.  

## 2019-02-06 NOTE — Progress Notes (Signed)
Called to room to assist during endoscopic procedure.  Patient ID and intended procedure confirmed with present staff. Received instructions for my participation in the procedure from the performing physician.  

## 2019-02-06 NOTE — Op Note (Signed)
[place] Patient Name: [name] Procedure Date: 02/06/2019 9:06 AM MRN: [id number] Endoscopist: Ladene Artist , MD Age: 53 Referring MD:  Date of Birth: 11/01/1965 Gender: Female Account #: [id number] Procedure:                Upper GI endoscopy Indications:              Epigastric abdominal pain Medicines:                Monitored Anesthesia Care Procedure:                Pre-Anesthesia Assessment:                           - Prior to the procedure, a History and Physical                            was performed, and patient medications and                            allergies were reviewed. The patient's tolerance of                            previous anesthesia was also reviewed. The risks                            and benefits of the procedure and the sedation                            options and risks were discussed with the patient.                            All questions were answered, and informed consent                            was obtained. Prior Anticoagulants: The patient has                            taken no previous anticoagulant or antiplatelet                            agents. ASA Grade Assessment: II - A patient with                            mild systemic disease. After reviewing the risks                            and benefits, the patient was deemed in                            satisfactory condition to undergo the procedure.                           After obtaining informed consent, the endoscope was  passed under direct vision. Throughout the                            procedure, the patient's blood pressure, pulse, and                            oxygen saturations were monitored continuously. The                            Endoscope was introduced through the mouth, and                            advanced to the second part of duodenum. The upper                            GI endoscopy was accomplished  without difficulty.                            The patient tolerated the procedure well. Scope In: Scope Out: Findings:                 The esophagus was normal.                           The stomach was normal.                           The examined duodenum was normal.                           The cardia and gastric fundus were normal on                            retroflexion. Complications:            No immediate complications. Estimated Blood Loss:     Estimated blood loss: none. Impression:               - Normal esophagus.                           - Normal stomach.                           - Normal examined duodenum.                           - No specimens collected. Recommendation:           - Patient has a contact number available for                            emergencies. The signs and symptoms of potential                            delayed complications were discussed with the  patient. Return to normal activities tomorrow.                            Written discharge instructions were provided to the                            patient.                           - Resume previous diet.                           - Continue present medications.                           - Dicyclomine 10 mg po tid ac, 1 year of refills.                           - Return to GI office in 6 weeks. Ladene Artist, MD 02/06/2019 9:53:33 AM This report has been signed electronically.

## 2019-02-06 NOTE — Patient Instructions (Signed)
Information on polyps given to you today.  Await pathology results.  Return to GI office in 6 weeks.  Dicyclomine 10mg  by mouth 3 times a day before meals, 1 year of refills.  YOU HAD AN ENDOSCOPIC PROCEDURE TODAY AT [facility]:   Refer to the procedure report that was given to you for any specific questions about what was found during the examination.  If the procedure report does not answer your questions, please call your gastroenterologist to clarify.  If you requested that your care partner not be given the details of your procedure findings, then the procedure report has been included in a sealed envelope for you to review at your convenience later.  YOU SHOULD EXPECT: Some feelings of bloating in the abdomen. Passage of more gas than usual.  Walking can help get rid of the air that was put into your GI tract during the procedure and reduce the bloating. If you had a lower endoscopy (such as a colonoscopy or flexible sigmoidoscopy) you may notice spotting of blood in your stool or on the toilet paper. If you underwent a bowel prep for your procedure, you may not have a normal bowel movement for a few days.  Please Note:  You might notice some irritation and congestion in your nose or some drainage.  This is from the oxygen used during your procedure.  There is no need for concern and it should clear up in a day or so.  SYMPTOMS TO REPORT IMMEDIATELY:   Following lower endoscopy (colonoscopy or flexible sigmoidoscopy):  Excessive amounts of blood in the stool  Significant tenderness or worsening of abdominal pains  Swelling of the abdomen that is new, acute  Fever of 100F or higher   Following upper endoscopy (EGD)  Vomiting of blood or coffee ground material  New chest pain or pain under the shoulder blades  Painful or persistently difficult swallowing  New shortness of breath  Fever of 100F or higher  Black, tarry-looking stools  For urgent or emergent  issues, a gastroenterologist can be reached at any hour by calling [phone].   DIET:  We do recommend a small meal at first, but then you may proceed to your regular diet.  Drink plenty of fluids but you should avoid alcoholic beverages for 24 hours.  ACTIVITY:  You should plan to take it easy for the rest of today and you should NOT DRIVE or use heavy machinery until tomorrow (because of the sedation medicines used during the test).    FOLLOW UP: Our staff will call the number listed on your records 48-72 hours following your procedure to check on you and address any questions or concerns that you may have regarding the information given to you following your procedure. If we do not reach you, we will leave a message.  We will attempt to reach you two times.  During this call, we will ask if you have developed any symptoms of COVID 19. If you develop any symptoms (ie: fever, flu-like symptoms, shortness of breath, cough etc.) before then, please call [phone].  If you test positive for Covid 19 in the 2 weeks post procedure, please call and report this information to [place].    If any biopsies were taken you will be contacted by phone or by letter within the next 1-3 weeks.  Please call us at [phone] if you have not heard about the biopsies in 3 weeks.    SIGNATURES/CONFIDENTIALITY: You and/or your care  partner have signed paperwork which will be entered into your electronic medical record.  These signatures attest to the fact that that the information above on your After Visit Summary has been reviewed and is understood.  Full responsibility of the confidentiality of this discharge information lies with you and/or your care-partner.

## 2019-02-06 NOTE — Progress Notes (Signed)
Pt's states no medical or surgical changes since previsit or office visit. 

## 2019-02-08 ENCOUNTER — Telehealth: Payer: Self-pay | Admitting: *Deleted

## 2019-02-08 NOTE — Telephone Encounter (Signed)
First follow up call attempt.  Reached voicemail with phone number identified.  Message left to call if any questions/ concerns or symptoms consistent with COVID-19.

## 2019-02-14 ENCOUNTER — Encounter: Payer: Self-pay | Admitting: Gastroenterology

## 2019-03-05 ENCOUNTER — Ambulatory Visit: Payer: BC Managed Care – PPO | Admitting: Gastroenterology

## 2019-03-30 ENCOUNTER — Other Ambulatory Visit: Payer: Self-pay | Admitting: Internal Medicine

## 2019-04-11 ENCOUNTER — Encounter: Payer: Self-pay | Admitting: Gynecology

## 2019-04-16 ENCOUNTER — Ambulatory Visit: Payer: BC Managed Care – PPO | Admitting: Gastroenterology

## 2019-04-18 ENCOUNTER — Encounter: Payer: Self-pay | Admitting: Internal Medicine

## 2019-04-18 ENCOUNTER — Ambulatory Visit (INDEPENDENT_AMBULATORY_CARE_PROVIDER_SITE_OTHER): Payer: BC Managed Care – PPO | Admitting: Internal Medicine

## 2019-04-18 ENCOUNTER — Ambulatory Visit: Payer: Self-pay | Admitting: *Deleted

## 2019-04-18 DIAGNOSIS — F411 Generalized anxiety disorder: Secondary | ICD-10-CM | POA: Diagnosis not present

## 2019-04-18 DIAGNOSIS — G44311 Acute post-traumatic headache, intractable: Secondary | ICD-10-CM | POA: Diagnosis not present

## 2019-04-18 DIAGNOSIS — S0990XA Unspecified injury of head, initial encounter: Secondary | ICD-10-CM | POA: Diagnosis not present

## 2019-04-18 DIAGNOSIS — J4520 Mild intermittent asthma, uncomplicated: Secondary | ICD-10-CM

## 2019-04-18 NOTE — Telephone Encounter (Signed)
I think this note is asking about scheduling a visit, ok for ROV next available, thanks
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
## 2019-04-18 NOTE — Patient Instructions (Signed)
.  You will be contacted regarding the referral for: Head CT scan  Please continue all other medications as before, and refills have been done if requested.  Please have the pharmacy call with any other refills you may need.  Please continue your efforts at being more active, low cholesterol diet, and weight control.  Please keep your appointments with your specialists as you may have planned  You are given the work note

## 2019-04-18 NOTE — Telephone Encounter (Addendum)
Pt called with complaints of motion sickness which started 04/14/2019, and worsened 04/16/2019; she was not feeling well on her flight back from [place]; she says that her balance is off; she also has a sore, scratchy throat; the pt says that she can not move her head suddenly because it feels like she is going to pass out; she previously felt nausea, lightheaded, and stomach irritation while in [place] (04/15/2019-04/17/2019); she has not checked her BP; recommendations made per nurse triage protocol; she verbalized understanding; the pt sees Dr [name], pt transferred to [facility] for scheduling.  Reason for Disposition . [1] MODERATE dizziness (e.g., interferes with normal activities) AND [2] has NOT been evaluated by physician for this  (Exception: dizziness caused by heat exposure, sudden standing, or poor fluid intake)  Answer Assessment - Initial Assessment Questions 1. DESCRIPTION: "Describe your dizziness."     Light headed 2. LIGHTHEADED: "Do you feel lightheaded?" (e.g., somewhat faint, woozy, weak upon standing)     When going from laying to sitting up/standing 3. VERTIGO: "Do you feel like either you or the room is spinning or tilting?" (i.e. vertigo)    no 4. SEVERITY: "How bad is it?"  "Do you feel like you are going to faint?" "Can you stand and walk?"   - MILD - walking normally   - MODERATE - interferes with normal activities (e.g., work, school)    - SEVERE - unable to stand, requires support to walk, feels like passing out now.     Mild to moderate 5. ONSET:  "When did the dizziness begin?"    04/14/2019 6. AGGRAVATING FACTORS: "Does anything make it worse?" (e.g., standing, change in head position)     Turning head from left to right; going from laying to sitting/standing 7. HEART RATE: "Can you tell me your heart rate?" "How many beats in 15 seconds?"  (Note: not all patients can do this)       Pt can not compete task 8. CAUSE: "What do you think is causing the dizziness?"     Not  sure; ? Motion sickness; ?allergies; ? BP; ? wearingmask  9. RECURRENT SYMPTOM: "Have you had dizziness before?" If so, ask: "When was the last time?" "What happened that time?"     Years ago 10. OTHER SYMPTOMS: "Do you have any other symptoms?" (e.g., fever, chest pain, vomiting, diarrhea, bleeding)       Nausea, sore/scratcy throat; ear pressure; sore around back of head and ears 11. PREGNANCY: "Is there any chance you are pregnant?" "When was your last menstrual period?"       No hysterectomy  Protocols used: DIZZINESS [facility]

## 2019-04-18 NOTE — Assessment & Plan Note (Signed)
situationaly mild worsening, ok to continue current tx

## 2019-04-18 NOTE — Assessment & Plan Note (Signed)
stable overall by history and exam, recent data reviewed with pt, and pt to continue medical treatment as before,  to f/u any worsening symptoms or concerns  

## 2019-04-19 ENCOUNTER — Other Ambulatory Visit: Payer: Self-pay

## 2019-04-19 ENCOUNTER — Ambulatory Visit (INDEPENDENT_AMBULATORY_CARE_PROVIDER_SITE_OTHER)
Admission: RE | Admit: 2019-04-19 | Discharge: 2019-04-19 | Disposition: A | Discharge status: Home / Self Care | Payer: BC Managed Care – PPO | Source: Ambulatory Visit | Attending: Internal Medicine | Admitting: Internal Medicine

## 2019-04-19 DIAGNOSIS — G44311 Acute post-traumatic headache, intractable: Secondary | ICD-10-CM

## 2019-04-20 ENCOUNTER — Ambulatory Visit (INDEPENDENT_AMBULATORY_CARE_PROVIDER_SITE_OTHER): Payer: BC Managed Care – PPO | Admitting: Internal Medicine

## 2019-04-20 ENCOUNTER — Other Ambulatory Visit: Payer: Self-pay

## 2019-04-20 ENCOUNTER — Encounter: Payer: Self-pay | Admitting: Internal Medicine

## 2019-04-20 DIAGNOSIS — S0990XD Unspecified injury of head, subsequent encounter: Secondary | ICD-10-CM | POA: Diagnosis not present

## 2019-04-20 DIAGNOSIS — R42 Dizziness and giddiness: Secondary | ICD-10-CM | POA: Diagnosis not present

## 2019-04-20 DIAGNOSIS — J4520 Mild intermittent asthma, uncomplicated: Secondary | ICD-10-CM

## 2019-04-20 MED ORDER — MECLIZINE HCL 12.5 MG PO TABS: 12.5000 mg | ORAL_TABLET | Freq: Three times a day (TID) | ORAL | 1 refills | Status: DC | PRN

## 2019-04-20 NOTE — Assessment & Plan Note (Signed)
C/w peripheral etiology, for meclizine prn, hydration,  to f/u any worsening symptoms or concerns

## 2019-04-20 NOTE — Assessment & Plan Note (Signed)
stable overall by history and exam, recent data reviewed with pt, and pt to continue medical treatment as before,  to f/u any worsening symptoms or concerns  

## 2019-04-20 NOTE — Progress Notes (Deleted)
   Subjective:    Patient ID: [name], female    DOB: Dec 08, 1965, 53 y.o.   MRN: [id number]  HPI    Review of Systems     Objective:   Physical Exam        Assessment & Plan:

## 2019-04-20 NOTE — Patient Instructions (Signed)
Please take all new medication as prescribed - the meclizine for dizziness  Please continue all other medications as before, and refills have been done if requested.  Please have the pharmacy call with any other refills you may need.  Please continue your efforts at being more active, low cholesterol diet, and weight control.  Please keep your appointments with your specialists as you may have planned

## 2019-04-20 NOTE — Assessment & Plan Note (Signed)
Ct head neg, symptoms improved,  to f/u any worsening symptoms or concerns

## 2019-04-20 NOTE — Telephone Encounter (Signed)
Pt scheduled for doxy

## 2019-04-20 NOTE — Progress Notes (Signed)
Patient ID: [name], female   DOB: 10/11/1965, 53 y.o.   MRN: [id number]  Virtual Visit via Video Note  I connected with [name] on 04/20/19 at  4:00 PM EDT by a video enabled telemedicine application and verified that I am speaking with the correct person using two identifiers.  Location: Patient: at home Provider: at office   I discussed the limitations of evaluation and management by telemedicine and the availability of in person appointments. The patient expressed understanding and agreed to proceed.  History of Present Illness: Here to f/u with improved HA, and Pt denies new neurological symptoms such as new headache, or facial or extremity weakness or numbness, except now with 2 days onset positional dizziness such that she gets dizzy with turning over or standing up and turning.   Pt denies fever, wt loss, night sweats, loss of appetite, or other constitutional symptoms  Pt denies chest pain, increased sob or doe, wheezing, orthopnea, PND, increased LE swelling, palpitations, dizziness or syncope.   Pt denies polydipsia, polyuria Past Medical History:  Diagnosis Date  . Allergic rhinitis   . Allergy   . Anxiety   . Depression   . GERD (gastroesophageal reflux disease)   . Hyperlipidemia   . Hyperplastic colon polyp 10/2003  . Hypertension   . Menorrhagia   . Obesity   . PONV (postoperative nausea and vomiting)   . RLS (restless legs syndrome)    Past Surgical History:  Procedure Laterality Date  . COLONOSCOPY    . CYSTOSCOPY N/A 10/12/2016   Procedure: CYSTOSCOPY;  Surgeon: Anastasio Auerbach, MD;  Location: [facility];  Service: Gynecology;  Laterality: N/A;  . ENDOMETRIAL ABLATION  2007   [facility] Landry Mellow at [facility]  . FOOT SURGERY    . LAPAROSCOPIC VAGINAL HYSTERECTOMY WITH SALPINGECTOMY Bilateral 10/12/2016   Procedure: LAPAROSCOPIC ASSISTED VAGINAL HYSTERECTOMY WITH SALPINGECTOMY;  Surgeon: Anastasio Auerbach, MD;  Location: [facility];  Service: Gynecology;   Laterality: Bilateral;  request to follow first case at around 10:30am.  Requests 2 1/2 hours for the case.  Marland Kitchen SALIVARY GLAND SURGERY  12-11   LEFT SIDE NODULE REMOVED WAS BENIGN  . TUBAL LIGATION      reports that she has never smoked. She has never used smokeless tobacco. She reports current alcohol use. She reports that she does not use drugs. family history includes Cancer in her mother; Colon cancer in her mother; Colon polyps in her father; Heart attack in her maternal grandfather; Hyperlipidemia in an other family member; Hypertension in an other family member. Allergies  Allergen Reactions  . Other     POLLEN AND DUST  . Sulfur Hives   Current Outpatient Medications on File Prior to Visit  Medication Sig Dispense Refill  . amLODipine (NORVASC) 5 MG tablet TAKE 1 TABLET BY MOUTH ONCE DAILY (Patient taking differently: Take 5 mg by mouth daily. ) 90 tablet 3  . clonazePAM (KLONOPIN) 0.5 MG tablet Take 1 tablet (0.5 mg total) by mouth 2 (two) times daily as needed for anxiety. 60 tablet 2  . dicyclomine (BENTYL) 10 MG capsule Take 1 capsule (10 mg total) by mouth 3 (three) times daily before meals. 90 capsule 3  . famotidine (PEPCID) 20 MG tablet Take 1 tablet (20 mg total) by mouth 2 (two) times daily. 180 tablet 3  . fluconazole (DIFLUCAN) 150 MG tablet TAKE 1 TABLET BY MOUTH EVERY 72 HOURS AS NEEDED (Patient not taking: Reported on 02/06/2019) 2 tablet 0  .  Fluocinolone Acetonide Body 0.01 % OIL Apply 1 application topically 2 (two) times daily as needed (flaking of scalp).   11  . Fluticasone-Salmeterol (ADVAIR DISKUS) 250-50 MCG/DOSE AEPB Inhale 1 puff into the lungs 2 (two) times daily. (Patient taking differently: Inhale 1 puff into the lungs 2 (two) times daily as needed (SOB, wheezing). ) 1 each 11  . hydrochlorothiazide (MICROZIDE) 12.5 MG capsule Take 1 capsule by mouth once daily 90 capsule 0  . ketoconazole (NIZORAL) 2 % shampoo Apply 1 application topically once a week.      Marland Kitchen LINZESS 145 MCG CAPS capsule Take 1 capsule by mouth once daily 30 capsule 5  . losartan (COZAAR) 100 MG tablet TAKE 1 TABLET BY MOUTH ONCE DAILY (Patient taking differently: Take 100 mg by mouth daily. ) 90 tablet 3  . pantoprazole (PROTONIX) 40 MG tablet Take 1 tablet (40 mg total) by mouth 2 (two) times daily. 180 tablet 3  . potassium chloride (K-DUR) 10 MEQ tablet Take 1 tablet (10 mEq total) by mouth daily. 90 tablet 3  . sertraline (ZOLOFT) 100 MG tablet Take 1 tablet (100 mg total) by mouth daily. 90 tablet 3  . [DISCONTINUED] fluticasone (FLONASE) 50 MCG/ACT nasal spray 2 sprays by Nasal route daily. 16 g 2   No current facility-administered medications on file prior to visit.     Observations/Objective: Alert, NAD, appropriate mood and affect, resps normal, cn 2-12 intact, moves all 4s, no visible rash or swelling Lab Results  Component Value Date   WBC 8.1 11/15/2018   HGB 12.4 11/15/2018   HCT 36.8 11/15/2018   PLT 213.0 11/15/2018   GLUCOSE 99 11/15/2018   CHOL 248 (H) 05/30/2018   TRIG 149.0 05/30/2018   HDL 50.30 05/30/2018   LDLDIRECT 156.0 04/17/2013   LDLCALC 168 (H) 05/30/2018   ALT 5 11/15/2018   AST 12 11/15/2018   NA 139 11/15/2018   K 3.7 11/15/2018   CL 104 11/15/2018   CREATININE 0.90 11/15/2018   BUN 13 11/15/2018   CO2 25 11/15/2018   TSH 0.57 05/30/2018   Assessment and Plan: See notes  Follow Up Instructions: See notes   I discussed the assessment and treatment plan with the patient. The patient was provided an opportunity to ask questions and all were answered. The patient agreed with the plan and demonstrated an understanding of the instructions.   The patient was advised to call back or seek an in-person evaluation if the symptoms worsen or if the condition fails to improve as anticipated.   [name], MD

## 2019-05-01 ENCOUNTER — Other Ambulatory Visit: Payer: Self-pay | Admitting: Internal Medicine

## 2019-05-16 ENCOUNTER — Ambulatory Visit: Payer: BC Managed Care – PPO | Admitting: Gastroenterology

## 2019-05-16 ENCOUNTER — Encounter: Payer: Self-pay | Admitting: Internal Medicine

## 2019-06-01 ENCOUNTER — Encounter: Payer: Self-pay | Admitting: Internal Medicine

## 2019-06-01 ENCOUNTER — Ambulatory Visit (INDEPENDENT_AMBULATORY_CARE_PROVIDER_SITE_OTHER): Payer: BC Managed Care – PPO | Admitting: Internal Medicine

## 2019-06-01 ENCOUNTER — Other Ambulatory Visit: Payer: Self-pay | Admitting: Internal Medicine

## 2019-06-01 ENCOUNTER — Other Ambulatory Visit: Payer: Self-pay

## 2019-06-01 ENCOUNTER — Other Ambulatory Visit (INDEPENDENT_AMBULATORY_CARE_PROVIDER_SITE_OTHER): Payer: BC Managed Care – PPO

## 2019-06-01 VITALS — BP 148/100 | Temp 98.3°F | Ht 62.0 in | Wt 186.0 lb

## 2019-06-01 DIAGNOSIS — E538 Deficiency of other specified B group vitamins: Secondary | ICD-10-CM | POA: Diagnosis not present

## 2019-06-01 DIAGNOSIS — Z0000 Encounter for general adult medical examination without abnormal findings: Secondary | ICD-10-CM

## 2019-06-01 DIAGNOSIS — I10 Essential (primary) hypertension: Secondary | ICD-10-CM | POA: Diagnosis not present

## 2019-06-01 DIAGNOSIS — Z23 Encounter for immunization: Secondary | ICD-10-CM

## 2019-06-01 LAB — CBC WITH DIFFERENTIAL/PLATELET
Basophils Absolute: 0 10*3/uL (ref 0.0–0.1)
Basophils Relative: 0.1 % (ref 0.0–3.0)
Eosinophils Absolute: 0 10*3/uL (ref 0.0–0.7)
Eosinophils Relative: 0.3 % (ref 0.0–5.0)
HCT: 37.8 % (ref 36.0–46.0)
Hemoglobin: 12.6 g/dL (ref 12.0–15.0)
Lymphocytes Relative: 32.3 % (ref 12.0–46.0)
Lymphs Abs: 2.4 10*3/uL (ref 0.7–4.0)
MCHC: 33.3 g/dL (ref 30.0–36.0)
MCV: 91.1 fl (ref 78.0–100.0)
Monocytes Absolute: 0.5 10*3/uL (ref 0.1–1.0)
Monocytes Relative: 7.3 % (ref 3.0–12.0)
Neutro Abs: 4.4 10*3/uL (ref 1.4–7.7)
Neutrophils Relative %: 60 % (ref 43.0–77.0)
Platelets: 232 10*3/uL (ref 150.0–400.0)
RBC: 4.15 Mil/uL (ref 3.87–5.11)
RDW: 14.3 % (ref 11.5–15.5)
WBC: 7.4 10*3/uL (ref 4.0–10.5)

## 2019-06-01 LAB — BASIC METABOLIC PANEL
BUN: 10 mg/dL (ref 6–23)
CO2: 32 mEq/L (ref 19–32)
Calcium: 9.7 mg/dL (ref 8.4–10.5)
Chloride: 100 mEq/L (ref 96–112)
Creatinine, Ser: 0.81 mg/dL (ref 0.40–1.20)
GFR: 90.22 mL/min (ref >60.00)
Glucose, Bld: 98 mg/dL (ref 70–99)
Potassium: 3.3 mEq/L — ABNORMAL LOW (ref 3.5–5.1)
Sodium: 140 mEq/L (ref 135–145)

## 2019-06-01 LAB — LIPID PANEL
Cholesterol: 240 mg/dL — ABNORMAL HIGH (ref 0–200)
HDL: 46.8 mg/dL (ref >39.00)
LDL Cholesterol: 171 mg/dL — ABNORMAL HIGH (ref 0–99)
NonHDL: 193
Total CHOL/HDL Ratio: 5
Triglycerides: 112 mg/dL (ref 0.0–149.0)
VLDL: 22.4 mg/dL (ref 0.0–40.0)

## 2019-06-01 LAB — URINALYSIS, ROUTINE W REFLEX MICROSCOPIC
Bilirubin Urine: NEGATIVE
Hgb urine dipstick: NEGATIVE
Ketones, ur: NEGATIVE
Leukocytes,Ua: NEGATIVE
Nitrite: NEGATIVE
Specific Gravity, Urine: 1.015 (ref 1.000–1.030)
Total Protein, Urine: NEGATIVE
Urine Glucose: NEGATIVE
Urobilinogen, UA: 0.2 (ref 0.0–1.0)
pH: 7.5 (ref 5.0–8.0)

## 2019-06-01 LAB — HEPATIC FUNCTION PANEL
ALT: 6 U/L (ref 0–35)
AST: 14 U/L (ref 0–37)
Albumin: 4.7 g/dL (ref 3.5–5.2)
Alkaline Phosphatase: 58 U/L (ref 39–117)
Bilirubin, Direct: 0.1 mg/dL (ref 0.0–0.3)
Total Bilirubin: 0.5 mg/dL (ref 0.2–1.2)
Total Protein: 8.1 g/dL (ref 6.0–8.3)

## 2019-06-01 LAB — TSH: TSH: 0.72 u[IU]/mL (ref 0.35–4.50)

## 2019-06-01 MED ORDER — ATORVASTATIN CALCIUM 20 MG PO TABS: 20.0000 mg | ORAL_TABLET | Freq: Every day | ORAL | 3 refills | Status: DC

## 2019-06-01 MED ORDER — POTASSIUM CHLORIDE ER 10 MEQ PO TBCR: 20.0000 meq | EXTENDED_RELEASE_TABLET | Freq: Every day | ORAL | 3 refills | Status: DC

## 2019-06-01 NOTE — Patient Instructions (Addendum)
You had the Tdap tetanus shot today  Please continue all other medications as before, and refills have been done if requested.  Please have the pharmacy call with any other refills you may need.  Please continue your efforts at being more active, low cholesterol diet, and weight control.  You are otherwise up to date with prevention measures today.  Please keep your appointments with your specialists as you may have planned  Please go to the LAB in the Basement (turn left off the elevator) for the tests to be done today  You will be contacted by phone if any changes need to be made immediately.  Otherwise, you will receive a letter about your results with an explanation, but please check with MyChart first.  Please remember to sign up for MyChart if you have not done so, as this will be important to you in the future with finding out test results, communicating by private email, and scheduling acute appointments online when needed.  Please return in 6 months, or sooner if needed 

## 2019-06-01 NOTE — Progress Notes (Signed)
Subjective:    Patient ID: [name], female    DOB: [birth year], 53 y.o.   MRN: [id number]  HPI  Here for wellness and f/u;  Overall doing ok;  Pt denies Chest pain, worsening SOB, DOE, wheezing, orthopnea, PND, worsening LE edema, palpitations, dizziness or syncope.  Pt denies neurological change such as new headache, facial or extremity weakness.  Pt denies polydipsia, polyuria, or low sugar symptoms. Pt states overall good compliance with treatment and medications, good tolerability, and has been trying to follow appropriate diet.  Pt denies worsening depressive symptoms, suicidal ideation or panic. No fever, night sweats, wt loss, loss of appetite, or other constitutional symptoms.  Pt states good ability with ADL's, has low fall risk, home safety reviewed and adequate, no other significant changes in hearing or vision, and only occasionally active with exercise. No new complaints Past Medical History:  Diagnosis Date  . Allergic rhinitis   . Allergy   . Anxiety   . Depression   . GERD (gastroesophageal reflux disease)   . Hyperlipidemia   . Hyperplastic colon polyp 10/2003  . Hypertension   . Menorrhagia   . Obesity   . PONV (postoperative nausea and vomiting)   . RLS (restless legs syndrome)    Past Surgical History:  Procedure Laterality Date  . COLONOSCOPY    . CYSTOSCOPY N/A 10/12/2016   Procedure: CYSTOSCOPY;  Surgeon: Anastasio Auerbach, MD;  Location: [facility];  Service: Gynecology;  Laterality: N/A;  . ENDOMETRIAL ABLATION  2007   Dr. Landry Mellow at [facility]  . FOOT SURGERY    . LAPAROSCOPIC VAGINAL HYSTERECTOMY WITH SALPINGECTOMY Bilateral 10/12/2016   Procedure: LAPAROSCOPIC ASSISTED VAGINAL HYSTERECTOMY WITH SALPINGECTOMY;  Surgeon: Anastasio Auerbach, MD;  Location: [facility];  Service: Gynecology;  Laterality: Bilateral;  request to follow first case at around 10:30am.  Requests 2 1/2 hours for the case.  Marland Kitchen SALIVARY GLAND SURGERY  12-11   LEFT SIDE NODULE REMOVED WAS  BENIGN  . TUBAL LIGATION      reports that she has never smoked. She has never used smokeless tobacco. She reports current alcohol use. She reports that she does not use drugs. family history includes Cancer in her mother; Colon cancer in her mother; Colon polyps in her father; Heart attack in her maternal grandfather; Hyperlipidemia in an other family member; Hypertension in an other family member. Allergies  Allergen Reactions  . Other     POLLEN AND DUST  . Sulfur Hives   Current Outpatient Medications on File Prior to Visit  Medication Sig Dispense Refill  . amLODipine (NORVASC) 5 MG tablet TAKE 1 TABLET BY MOUTH ONCE DAILY (Patient taking differently: Take 5 mg by mouth daily. ) 90 tablet 3  . clonazePAM (KLONOPIN) 0.5 MG tablet Take 1 tablet (0.5 mg total) by mouth 2 (two) times daily as needed for anxiety. 60 tablet 2  . dicyclomine (BENTYL) 10 MG capsule Take 1 capsule (10 mg total) by mouth 3 (three) times daily before meals. 90 capsule 3  . famotidine (PEPCID) 20 MG tablet Take 1 tablet (20 mg total) by mouth 2 (two) times daily. 180 tablet 3  . fluconazole (DIFLUCAN) 150 MG tablet TAKE 1 TABLET BY MOUTH EVERY 72 HOURS AS NEEDED 2 tablet 0  . Fluocinolone Acetonide Body 0.01 % OIL Apply 1 application topically 2 (two) times daily as needed (flaking of scalp).   11  . Fluticasone-Salmeterol (ADVAIR DISKUS) 250-50 MCG/DOSE AEPB Inhale 1 puff into the lungs  2 (two) times daily. (Patient taking differently: Inhale 1 puff into the lungs 2 (two) times daily as needed (SOB, wheezing). ) 1 each 11  . hydrochlorothiazide (MICROZIDE) 12.5 MG capsule Take 1 capsule by mouth once daily 90 capsule 0  . ketoconazole (NIZORAL) 2 % shampoo Apply 1 application topically once a week.     Marland Kitchen LINZESS 145 MCG CAPS capsule Take 1 capsule by mouth once daily 30 capsule 5  . losartan (COZAAR) 100 MG tablet Take 1 tablet by mouth once daily 90 tablet 0  . meclizine (ANTIVERT) 12.5 MG tablet Take 1 tablet  (12.5 mg total) by mouth 3 (three) times daily as needed for dizziness. 30 tablet 1  . pantoprazole (PROTONIX) 40 MG tablet Take 1 tablet (40 mg total) by mouth 2 (two) times daily. 180 tablet 3  . sertraline (ZOLOFT) 100 MG tablet Take 1 tablet (100 mg total) by mouth daily. 90 tablet 3  . [DISCONTINUED] fluticasone (FLONASE) 50 MCG/ACT nasal spray 2 sprays by Nasal route daily. 16 g 2   No current facility-administered medications on file prior to visit.    Review of Systems Constitutional: Negative for other unusual diaphoresis, sweats, appetite or weight changes HENT: Negative for other worsening hearing loss, ear pain, facial swelling, mouth sores or neck stiffness.   Eyes: Negative for other worsening pain, redness or other visual disturbance.  Respiratory: Negative for other stridor or swelling Cardiovascular: Negative for other palpitations or other chest pain  Gastrointestinal: Negative for worsening diarrhea or loose stools, blood in stool, distention or other pain Genitourinary: Negative for hematuria, flank pain or other change in urine volume.  Musculoskeletal: Negative for myalgias or other joint swelling.  Skin: Negative for other color change, or other wound or worsening drainage.  Neurological: Negative for other syncope or numbness. Hematological: Negative for other adenopathy or swelling Psychiatric/Behavioral: Negative for hallucinations, other worsening agitation, SI, self-injury, or new decreased concentration All otherwise neg per pt      Objective:   Physical Exam BP (!) 148/100   Temp 98.3 F (36.8 C) (Oral)   Ht 5\' 2"  (1.575 m)   Wt 186 lb (84.4 kg)   LMP 10/01/2016   BMI 34.02 kg/m  VS noted,  Constitutional: Pt is oriented to person, place, and time. Appears well-developed and well-nourished, in no significant distress and comfortable Head: Normocephalic and atraumatic  Eyes: Conjunctivae and EOM are normal. Pupils are equal, round, and reactive to light  Right Ear: External ear normal without discharge Left Ear: External ear normal without discharge Nose: Nose without discharge or deformity Mouth/Throat: Oropharynx is without other ulcerations and moist  Neck: Normal range of motion. Neck supple. No JVD present. No tracheal deviation present or significant neck LA or mass Cardiovascular: Normal rate, regular rhythm, normal heart sounds and intact distal pulses.   Pulmonary/Chest: WOB normal and breath sounds without rales or wheezing  Abdominal: Soft. Bowel sounds are normal. NT. No HSM  Musculoskeletal: Normal range of motion. Exhibits no edema Lymphadenopathy: Has no other cervical adenopathy.  Neurological: Pt is alert and oriented to person, place, and time. Pt has normal reflexes. No cranial nerve deficit. Motor grossly intact, Gait intact Skin: Skin is warm and dry. No rash noted or new ulcerations Psychiatric:  Has normal mood and affect. Behavior is normal without agitation All otherwise neg per pt Lab Results  Component Value Date   WBC 7.4 06/01/2019   HGB 12.6 06/01/2019   HCT 37.8 06/01/2019   PLT 232.0  06/01/2019   GLUCOSE 98 06/01/2019   CHOL 240 (H) 06/01/2019   TRIG 112.0 06/01/2019   HDL 46.80 06/01/2019   LDLDIRECT 156.0 04/17/2013   LDLCALC 171 (H) 06/01/2019   ALT 6 06/01/2019   AST 14 06/01/2019   NA 140 06/01/2019   K 3.3 (L) 06/01/2019   CL 100 06/01/2019   CREATININE 0.81 06/01/2019   BUN 10 06/01/2019   CO2 32 06/01/2019   TSH 0.72 06/01/2019      Assessment & Plan:

## 2019-06-02 ENCOUNTER — Encounter: Payer: Self-pay | Admitting: Internal Medicine

## 2019-06-02 DIAGNOSIS — E538 Deficiency of other specified B group vitamins: Secondary | ICD-10-CM | POA: Insufficient documentation

## 2019-06-02 MED ORDER — LINACLOTIDE 145 MCG PO CAPS: 145.0000 ug | ORAL_CAPSULE | Freq: Every day | ORAL | 5 refills | Status: DC

## 2019-06-02 MED ORDER — FLUTICASONE-SALMETEROL 250-50 MCG/DOSE IN AEPB: 1.0000 | INHALATION_SPRAY | Freq: Two times a day (BID) | RESPIRATORY_TRACT | 11 refills | Status: DC

## 2019-06-02 MED ORDER — LOSARTAN POTASSIUM 100 MG PO TABS: 100.0000 mg | ORAL_TABLET | Freq: Every day | ORAL | 3 refills | Status: DC

## 2019-06-02 MED ORDER — PANTOPRAZOLE SODIUM 40 MG PO TBEC: 40.0000 mg | DELAYED_RELEASE_TABLET | Freq: Two times a day (BID) | ORAL | 3 refills | Status: DC

## 2019-06-02 MED ORDER — AMLODIPINE BESYLATE 5 MG PO TABS: 5.0000 mg | ORAL_TABLET | Freq: Every day | ORAL | 3 refills | Status: DC

## 2019-06-02 MED ORDER — ATORVASTATIN CALCIUM 20 MG PO TABS: 20.0000 mg | ORAL_TABLET | Freq: Every day | ORAL | 3 refills | Status: DC

## 2019-06-02 MED ORDER — FAMOTIDINE 20 MG PO TABS: 20.0000 mg | ORAL_TABLET | Freq: Two times a day (BID) | ORAL | 3 refills | Status: DC

## 2019-06-02 MED ORDER — HYDROCHLOROTHIAZIDE 12.5 MG PO CAPS: 12.5000 mg | ORAL_CAPSULE | Freq: Every day | ORAL | 3 refills | Status: DC

## 2019-06-02 MED ORDER — CLONAZEPAM 0.5 MG PO TABS: 0.5000 mg | ORAL_TABLET | Freq: Two times a day (BID) | ORAL | 2 refills | Status: DC | PRN

## 2019-06-02 MED ORDER — POTASSIUM CHLORIDE ER 10 MEQ PO TBCR: 20.0000 meq | EXTENDED_RELEASE_TABLET | Freq: Every day | ORAL | 3 refills | Status: DC

## 2019-06-02 MED ORDER — SERTRALINE HCL 100 MG PO TABS: 100.0000 mg | ORAL_TABLET | Freq: Every day | ORAL | 3 refills | Status: DC

## 2019-06-02 NOTE — Assessment & Plan Note (Signed)
stable overall by history and exam, recent data reviewed with pt, and pt to continue medical treatment as before,  to f/u any worsening symptoms or concerns  

## 2019-06-02 NOTE — Assessment & Plan Note (Signed)
For f/u lab 

## 2019-06-02 NOTE — Assessment & Plan Note (Signed)

## 2019-06-07 ENCOUNTER — Encounter: Payer: Self-pay | Admitting: Internal Medicine

## 2019-07-14 ENCOUNTER — Other Ambulatory Visit: Payer: Self-pay | Admitting: Family

## 2019-07-19 MED ORDER — METHOCARBAMOL 500 MG PO TABS: 500.0000 mg | ORAL_TABLET | Freq: Four times a day (QID) | ORAL | 1 refills | Status: DC

## 2019-07-19 NOTE — Addendum Note (Signed)
Addended by: Biagio Borg on: 07/19/2019 09:27 AM   Modules accepted: Orders

## 2019-07-25 ENCOUNTER — Other Ambulatory Visit: Payer: Self-pay

## 2019-07-26 ENCOUNTER — Encounter: Payer: Self-pay | Admitting: Obstetrics & Gynecology

## 2019-07-26 ENCOUNTER — Ambulatory Visit: Payer: BC Managed Care – PPO | Admitting: Obstetrics & Gynecology

## 2019-07-26 VITALS — BP 136/82

## 2019-07-26 DIAGNOSIS — R350 Frequency of micturition: Secondary | ICD-10-CM

## 2019-07-26 DIAGNOSIS — Z113 Encounter for screening for infections with a predominantly sexual mode of transmission: Secondary | ICD-10-CM

## 2019-07-26 DIAGNOSIS — N898 Other specified noninflammatory disorders of vagina: Secondary | ICD-10-CM

## 2019-07-26 LAB — URINALYSIS, COMPLETE W/RFL CULTURE
Bacteria, UA: NONE SEEN /HPF
Bilirubin Urine: NEGATIVE
Glucose, UA: NEGATIVE
Hgb urine dipstick: NEGATIVE
Hyaline Cast: NONE SEEN /LPF
Ketones, ur: NEGATIVE
Leukocyte Esterase: NEGATIVE
Nitrites, Initial: NEGATIVE
Protein, ur: NEGATIVE
RBC / HPF: NONE SEEN /HPF (ref 0–2)
Specific Gravity, Urine: 1.025 (ref 1.001–1.03)
WBC, UA: NONE SEEN /HPF (ref 0–5)
pH: 6 (ref 5.0–8.0)

## 2019-07-26 LAB — NO CULTURE INDICATED

## 2019-07-26 LAB — WET PREP FOR TRICH, YEAST, CLUE

## 2019-07-26 NOTE — Progress Notes (Signed)
    [name] Mar 08, 1966 [id number]        54 y.o.  G2P2L2  Stable boyfriend   RP: Vaginal discharge and Urinary frequency  HPI: S/P Total Hysterectomy.  Tried an Antifungal cream, but still having a discharge.  Urinary frequency, no other UTI Sx.  No fever.   OB History  Gravida Para Term Preterm AB Living  2 2       2   SAB TAB Ectopic Multiple Live Births               # Outcome Date GA Lbr Len/2nd Weight Sex Delivery Anes PTL Lv  2 Para      Vag-Spont     1 Para      Vag-Spont       Past medical history,surgical history, problem list, medications, allergies, family history and social history were all reviewed and documented in the EPIC chart.   Directed ROS with pertinent positives and negatives documented in the history of present illness/assessment and plan.  Exam:  Vitals:   07/26/19 1033  BP: 136/82   General appearance:  Normal  CVAT Negative bilaterally  Abdomen: Soft, normal  Gynecologic exam: Vulva normal.  Speculum:  Vaginal vault normal.  Antifungal cream present.  Wet prep done.  Gono-Chlam done.  Wet prep negative U/A completely negative   Assessment/Plan:  54 y.o. [name]   1. Vaginal discharge Normal gynecologic exam with negative wet prep.  Antifungal cream was still present on exam, might have been what patient thought was a continued vaginal discharge.  Gonorrhea and chlamydia pending. - WET PREP FOR [place], YEAST, CLUE  2. Frequent urination Urine analysis completely negative.  Patient reassured. - Urinalysis,Complete w/RFL Culture  3. Screen for STD (sexually transmitted disease) Recommend condom use. - GC Probe Amp, ThinPrep - Chlamydia Probe Amp, ThinPrep  Counseling on above issues and coordination of care more than 50% for 25 minutes.  Princess Bruins MD, 11:01 AM 07/26/2019

## 2019-07-27 ENCOUNTER — Ambulatory Visit: Payer: BC Managed Care – PPO | Admitting: Internal Medicine

## 2019-07-27 LAB — CHLAMYDIA PROBE AMP THINPREP: C. trachomatis RNA, TMA: NOT DETECTED

## 2019-07-27 LAB — GC PROBE AMP THINPREP: N. gonorrhoeae RNA, TMA: NOT DETECTED

## 2019-07-30 ENCOUNTER — Encounter: Payer: Self-pay | Admitting: Obstetrics & Gynecology

## 2019-07-30 NOTE — Patient Instructions (Signed)
1. Vaginal discharge Normal gynecologic exam with negative wet prep.  Antifungal cream was still present on exam, might have been what patient thought was a continued vaginal discharge.  Gonorrhea and chlamydia pending. - WET PREP FOR [place], YEAST, CLUE  2. Frequent urination Urine analysis completely negative.  Patient reassured. - Urinalysis,Complete w/RFL Culture  3. Screen for STD (sexually transmitted disease) Recommend condom use. - GC Probe Amp, ThinPrep - Chlamydia Probe Amp, ThinPrep  [name], it was a pleasure seeing you today!

## 2019-07-31 ENCOUNTER — Ambulatory Visit: Payer: BC Managed Care – PPO | Admitting: Internal Medicine

## 2019-07-31 DIAGNOSIS — Z0289 Encounter for other administrative examinations: Secondary | ICD-10-CM

## 2019-08-15 ENCOUNTER — Ambulatory Visit: Payer: BC Managed Care – PPO | Admitting: Internal Medicine

## 2019-08-15 ENCOUNTER — Encounter: Payer: Self-pay | Admitting: Internal Medicine

## 2019-08-15 ENCOUNTER — Other Ambulatory Visit: Payer: Self-pay

## 2019-08-15 ENCOUNTER — Ambulatory Visit (INDEPENDENT_AMBULATORY_CARE_PROVIDER_SITE_OTHER): Payer: BC Managed Care – PPO

## 2019-08-15 DIAGNOSIS — M542 Cervicalgia: Secondary | ICD-10-CM

## 2019-08-15 DIAGNOSIS — I10 Essential (primary) hypertension: Secondary | ICD-10-CM | POA: Diagnosis not present

## 2019-08-15 DIAGNOSIS — F32A Depression, unspecified: Secondary | ICD-10-CM

## 2019-08-15 DIAGNOSIS — F329 Major depressive disorder, single episode, unspecified: Secondary | ICD-10-CM

## 2019-08-15 DIAGNOSIS — J4520 Mild intermittent asthma, uncomplicated: Secondary | ICD-10-CM | POA: Diagnosis not present

## 2019-08-15 MED ORDER — IBUPROFEN 800 MG PO TABS: 800.0000 mg | ORAL_TABLET | Freq: Three times a day (TID) | ORAL | 1 refills | Status: DC | PRN

## 2019-08-15 MED ORDER — METHOCARBAMOL 500 MG PO TABS: 500.0000 mg | ORAL_TABLET | Freq: Four times a day (QID) | ORAL | 1 refills | Status: DC

## 2019-08-15 NOTE — Progress Notes (Signed)
Subjective:    Patient ID: [name], female    DOB: 1965-08-06, 54 y.o.   MRN: [id number]  HPI   Here with neck pain since Oct 2020 with pain more on the left side of neck to the upper back and left shoulder and left upepr back near the shoulder blade; has popping and crackling to horizontal head turning.Elwin Mocha can help, and the advil pm and muscle relaxer can help somewhat at night, has to wear the travel pillow to keep the head more straight at night, has upper left arm pain with holding something too long but o/w no LUE radicular symptoms.  Denies worsening depressive symptoms, suicidal ideation, or panic.  Pt denies chest pain, increased sob or doe, wheezing, orthopnea, PND, increased LE swelling, palpitations, dizziness or syncope. Past Medical History:  Diagnosis Date  . Allergic rhinitis   . Allergy   . Anxiety   . Depression   . GERD (gastroesophageal reflux disease)   . Hyperlipidemia   . Hyperplastic colon polyp 10/2003  . Hypertension   . Menorrhagia   . Obesity   . PONV (postoperative nausea and vomiting)   . RLS (restless legs syndrome)    Past Surgical History:  Procedure Laterality Date  . COLONOSCOPY    . CYSTOSCOPY N/A 10/12/2016   Procedure: CYSTOSCOPY;  Surgeon: Anastasio Auerbach, MD;  Location: [facility];  Service: Gynecology;  Laterality: N/A;  . ENDOMETRIAL ABLATION  2007   Dr. Landry Mellow at [facility]  . FOOT SURGERY    . LAPAROSCOPIC VAGINAL HYSTERECTOMY WITH SALPINGECTOMY Bilateral 10/12/2016   Procedure: LAPAROSCOPIC ASSISTED VAGINAL HYSTERECTOMY WITH SALPINGECTOMY;  Surgeon: Anastasio Auerbach, MD;  Location: [facility];  Service: Gynecology;  Laterality: Bilateral;  request to follow first case at around 10:30am.  Requests 2 1/2 hours for the case.  Marland Kitchen SALIVARY GLAND SURGERY  12-11   LEFT SIDE NODULE REMOVED WAS BENIGN  . TUBAL LIGATION      reports that she has never smoked. She has never used smokeless tobacco. She reports current alcohol use. She  reports that she does not use drugs. family history includes Cancer in her mother; Colon cancer in her mother; Colon polyps in her father; Heart attack in her maternal grandfather; Hyperlipidemia in an other family member; Hypertension in an other family member. Allergies  Allergen Reactions  . Other     POLLEN AND DUST  . Sulfur Hives   Current Outpatient Medications on File Prior to Visit  Medication Sig Dispense Refill  . amLODipine (NORVASC) 5 MG tablet Take 1 tablet (5 mg total) by mouth daily. 90 tablet 3  . clonazePAM (KLONOPIN) 0.5 MG tablet Take 1 tablet (0.5 mg total) by mouth 2 (two) times daily as needed for anxiety. 60 tablet 2  . Coenzyme Q10 (COQ10 PO) Take by mouth.    . famotidine (PEPCID) 20 MG tablet Take 1 tablet (20 mg total) by mouth 2 (two) times daily. 180 tablet 3  . Fluocinolone Acetonide Body 0.01 % OIL Apply 1 application topically 2 (two) times daily as needed (flaking of scalp).   11  . Fluticasone-Salmeterol (ADVAIR DISKUS) 250-50 MCG/DOSE AEPB Inhale 1 puff into the lungs 2 (two) times daily. 1 each 11  . hydrochlorothiazide (MICROZIDE) 12.5 MG capsule Take 1 capsule (12.5 mg total) by mouth daily. 90 capsule 3  . ketoconazole (NIZORAL) 2 % shampoo Apply 1 application topically once a week.     . linaclotide (LINZESS) 145 MCG CAPS capsule  Take 1 capsule (145 mcg total) by mouth daily. 30 capsule 5  . losartan (COZAAR) 100 MG tablet Take 1 tablet (100 mg total) by mouth daily. 90 tablet 3  . pantoprazole (PROTONIX) 40 MG tablet Take 1 tablet (40 mg total) by mouth 2 (two) times daily. 180 tablet 3  . potassium chloride (KLOR-CON) 10 MEQ tablet Take 2 tablets (20 mEq total) by mouth daily. 180 tablet 3  . sertraline (ZOLOFT) 100 MG tablet Take 1 tablet (100 mg total) by mouth daily. 90 tablet 3  . Turmeric (QC TUMERIC COMPLEX PO) Take by mouth.    . dicyclomine (BENTYL) 10 MG capsule Take 1 capsule (10 mg total) by mouth 3 (three) times daily before meals.  (Patient not taking: Reported on 08/15/2019) 90 capsule 3  . meclizine (ANTIVERT) 12.5 MG tablet Take 1 tablet (12.5 mg total) by mouth 3 (three) times daily as needed for dizziness. (Patient not taking: Reported on 07/26/2019) 30 tablet 1  . [DISCONTINUED] fluticasone (FLONASE) 50 MCG/ACT nasal spray 2 sprays by Nasal route daily. 16 g 2   No current facility-administered medications on file prior to visit.   Review of Systems All otherwise neg per pt     Objective:   Physical Exam BP (!) 152/98   Pulse 71   Temp 98.9 F (37.2 C)   Ht 5\' 2"  (1.575 m)   Wt 177 lb 6.4 oz (80.5 kg)   LMP 10/01/2016   SpO2 99%   BMI 32.45 kg/m  VS noted,  Constitutional: Pt appears in NAD HENT: Head: NCAT.  Right Ear: External ear normal.  Left Ear: External ear normal.  Eyes: . Pupils are equal, round, and reactive to light. Conjunctivae and EOM are normal Nose: without d/c or deformity Neck: Neck supple. Gross normal ROM, mild tender left post lateral Cardiovascular: Normal rate and regular rhythm.   Pulmonary/Chest: Effort normal and breath sounds without rales or wheezing.  Abd:  Soft, NT, ND, + BS, no organomegaly Neurological: Pt is alert. At baseline orientation, motor 5/5 intact Skin: Skin is warm. No rashes, other new lesions, no LE edema Psychiatric: Pt behavior is normal without agitation  All otherwise neg per pt Lab Results  Component Value Date   WBC 7.4 06/01/2019   HGB 12.6 06/01/2019   HCT 37.8 06/01/2019   PLT 232.0 06/01/2019   GLUCOSE 98 06/01/2019   CHOL 240 (H) 06/01/2019   TRIG 112.0 06/01/2019   HDL 46.80 06/01/2019   LDLDIRECT 156.0 04/17/2013   LDLCALC 171 (H) 06/01/2019   ALT 6 06/01/2019   AST 14 06/01/2019   NA 140 06/01/2019   K 3.3 (L) 06/01/2019   CL 100 06/01/2019   CREATININE 0.81 06/01/2019   BUN 10 06/01/2019   CO2 32 06/01/2019   TSH 0.72 06/01/2019       Assessment & Plan:

## 2019-08-15 NOTE — Patient Instructions (Signed)
Please take all new medication as prescribed - the ibuprofen and muscle relaxer as needed  Please continue all other medications as before, and refills have been done if requested.  Please have the pharmacy call with any other refills you may need.  Please continue your efforts at being more active, low cholesterol diet, and weight control.  You are otherwise up to date with prevention measures today.  Please keep your appointments with your specialists as you may have planned  Please go to the XRAY Department in the first floor for the x-ray testing  You will be contacted regarding the referral for: MRI and orthopedic referral  You will be contacted by phone if any changes need to be made immediately.  Otherwise, you will receive a letter about your results with an explanation, but please check with MyChart first.  Please remember to sign up for MyChart if you have not done so, as this will be important to you in the future with finding out test results, communicating by private email, and scheduling acute appointments online when needed.

## 2019-08-15 NOTE — Assessment & Plan Note (Signed)
stable overall by history and exam, recent data reviewed with pt, and pt to continue medical treatment as before,  to f/u any worsening symptoms or concerns  

## 2019-08-15 NOTE — Assessment & Plan Note (Addendum)
C/w likely underlying cspien djd/ddd, for films today, also MRI and orthopedic referral, declines other pain control  I spent 31 minutes preparing to see the patient by review of recent labs, imaging and procedures, obtaining and reviewing separately obtained history, communicating with the patient and family or caregiver, ordering medications, tests or procedures, and documenting clinical information in the EHR including the differential Dx, treatment, and any further evaluation and other management of neck pain left side, HTN, depression, asthma

## 2019-08-15 NOTE — Assessment & Plan Note (Signed)
stalbe

## 2019-08-17 ENCOUNTER — Telehealth: Payer: Self-pay

## 2019-08-17 MED ORDER — MECLIZINE HCL 12.5 MG PO TABS: 12.5000 mg | ORAL_TABLET | Freq: Three times a day (TID) | ORAL | 1 refills | Status: AC | PRN

## 2019-08-17 NOTE — Telephone Encounter (Signed)
Ok done erx 

## 2019-08-17 NOTE — Telephone Encounter (Signed)
Patient is requesting a refill on meclizine 12.5 mg tabs.   On 07/26/19 it's documented that she wasn't taking the medication.  Please advise

## 2019-08-20 ENCOUNTER — Encounter: Payer: Self-pay | Admitting: Internal Medicine

## 2019-08-27 DIAGNOSIS — H6983 Other specified disorders of Eustachian tube, bilateral: Secondary | ICD-10-CM | POA: Insufficient documentation

## 2019-08-27 DIAGNOSIS — H6993 Unspecified Eustachian tube disorder, bilateral: Secondary | ICD-10-CM | POA: Insufficient documentation

## 2019-08-27 DIAGNOSIS — H9190 Unspecified hearing loss, unspecified ear: Secondary | ICD-10-CM | POA: Insufficient documentation

## 2019-08-28 ENCOUNTER — Encounter: Payer: Self-pay | Admitting: Family Medicine

## 2019-08-28 ENCOUNTER — Ambulatory Visit: Payer: BC Managed Care – PPO | Admitting: Family Medicine

## 2019-08-28 ENCOUNTER — Other Ambulatory Visit: Payer: Self-pay

## 2019-08-28 DIAGNOSIS — M542 Cervicalgia: Secondary | ICD-10-CM | POA: Diagnosis not present

## 2019-08-28 MED ORDER — PREDNISONE 10 MG PO TABS: ORAL_TABLET | ORAL | 0 refills | Status: DC

## 2019-08-28 MED ORDER — TIZANIDINE HCL 2 MG PO TABS: 2.0000 mg | ORAL_TABLET | Freq: Four times a day (QID) | ORAL | 1 refills | Status: DC | PRN

## 2019-08-28 NOTE — Progress Notes (Signed)
I saw and examined the patient with Dr. Mayer Masker and agree with assessment and plan as outlined.    Left-sided neck pain since October.  X-Rays show C4-5, C5-6 DDD.  MRI pending.  Will try PT, prednisone, baclofen.  Consider ESI if not improving, depending on MRI results.

## 2019-08-28 NOTE — Progress Notes (Signed)
[name] - 54 y.o. female MRN [id number]  Date of birth: 08/23/1965  Office Visit Note: Visit Date: 08/28/2019 PCP: Biagio Borg, MD Referred by: Biagio Borg, MD  Subjective: Chief Complaint  Patient presents with  . Neck - Pain    Pain left side of neck since October 2020. 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 Pain radiates into both shoulders and into the midback.   HPI: CASHAE Buchanan is a 54 y.o. female who comes in today with left sided neck pain for the past 5 months. She reports that she started to have a "crick in her neck" in October. Pain has progressively gotten worse and now she is starting to have numbness and weakness in left arm. Pain radiates into shoulder and midback. She feels slightly weaker on left (is right handed). Pain with turning neck to left-- she now turns her whole body to look left.  No specific injury. She has worked as a Recruitment consultant for 20 years. Her PCP provided a muscle which helped some. She has otherwise been taking ibuprofen and using icy hot. She has an MRI scheduled for tomorrow.  Otherwise per HPI.  Assessment & Plan: Visit Diagnoses:  1. Neck pain     Plan: Left sided neck pain for the past 4-5 months with x-rays showing degenerative disc disease at C4-C5 and C5-C6; suspect disc bulge causing symptoms. Will follow up on MRI. In the interim, will trial prednisone and baclofen. Will refer to PT. If no relief, will proceed with epidural steroid injection depending on what MRI shows.   Meds & Orders:  Meds ordered this encounter  Medications  . tiZANidine (ZANAFLEX) 2 MG tablet    Sig: Take 1-2 tablets (2-4 mg total) by mouth every 6 (six) hours as needed for muscle spasms.    Dispense:  60 tablet    Refill:  1  . predniSONE (DELTASONE) 10 MG tablet    Sig: Take as directed for 12 days.  Daily dose 6,6,5,5,4,4,3,3,2,2,1,1.    Dispense:  42 tablet   Refill:  0    Orders Placed This Encounter  Procedures  . Ambulatory referral to Physical Therapy    Follow-up: No follow-ups on file.   Procedures: No procedures performed  No notes on file   Clinical History: No specialty comments available.   She reports that she has never smoked. She has never used smokeless tobacco. No results for input(s): HGBA1C, LABURIC in the last 8760 hours.  Objective:  VS:  HT:    WT:   BMI:     BP:   HR: bpm  TEMP: ( )  RESP:  Physical Exam  PHYSICAL EXAM: Gen: NAD, alert, cooperative with exam, well-appearing HEENT: clear conjunctiva,  CV:  no edema, capillary refill brisk, normal rate Resp: non-labored Skin: no rashes, normal turgor  Neuro: no gross deficits.  Psych:  alert and oriented  Ortho Exam  Neck/Back: - Inspection: no gross deformity or asymmetry, swelling or ecchymosis - Palpation: TTP over L4-L5 spinous process with trigger point in L4 paraspinal msucle, TTP at rhomboid with pressure point at mid scapula level - ROM: limited ROM of the cervical spine with neck extension left rotation - pain in all directions - Strength: 5/5 wrist flexion, extension, biceps flexion (possibly weaker than right but still storng. 5/5 triceps extension. OK sign, interosseus strength intact.  - Neuro:  sensation intact in the C5-C8 nerve root distribution b/l, 2+ C5-C7 reflexes - Special testing: spurling's makes pain worse  Imaging: No results found.  Past Medical/Family/Surgical/Social History: Medications & Allergies reviewed per EMR, new medications updated. Patient Active Problem List   Diagnosis Date Noted  . Neck pain on left side 08/15/2019  . B12 deficiency 06/02/2019  . Vertigo 04/20/2019  . Closed head injury 04/18/2019  . Dysphagia 11/13/2018  . Epigastric pain 11/13/2018  . Anemia 11/13/2018  . Asthma 10/10/2018  . Acute upper respiratory infection 09/18/2018  . Bad breath 05/25/2016  . Abnormal TSH 05/09/2015  . Other and  unspecified ovarian cyst 05/30/2013  . Chronic constipation 04/17/2013  . Preventative health care 10/12/2010  . Hyperlipidemia 03/26/2007  . Anxiety state 03/26/2007  . Depression 03/26/2007  . Essential hypertension 03/26/2007  . Allergic rhinitis 03/26/2007  . GERD 03/26/2007  . IBS 03/26/2007  . COLONIC POLYPS, HX OF 03/26/2007   Past Medical History:  Diagnosis Date  . Allergic rhinitis   . Allergy   . Anxiety   . Depression   . GERD (gastroesophageal reflux disease)   . Hyperlipidemia   . Hyperplastic colon polyp 10/2003  . Hypertension   . Menorrhagia   . Obesity   . PONV (postoperative nausea and vomiting)   . RLS (restless legs syndrome)    Family History  Problem Relation Age of Onset  . Cancer Mother        colon cancer at 18 yo  . Colon cancer Mother   . Colon polyps Father   . Hyperlipidemia Other   . Hypertension Other   . Heart attack Maternal Grandfather   . Esophageal cancer Neg Hx   . Rectal cancer Neg Hx   . Stomach cancer Neg Hx    Past Surgical History:  Procedure Laterality Date  . COLONOSCOPY    . CYSTOSCOPY N/A 10/12/2016   Procedure: CYSTOSCOPY;  Surgeon: Anastasio Auerbach, MD;  Location: [facility];  Service: Gynecology;  Laterality: N/A;  . ENDOMETRIAL ABLATION  2007   Dr. Landry Mellow at [facility]  . FOOT SURGERY    . LAPAROSCOPIC VAGINAL HYSTERECTOMY WITH SALPINGECTOMY Bilateral 10/12/2016   Procedure: LAPAROSCOPIC ASSISTED VAGINAL HYSTERECTOMY WITH SALPINGECTOMY;  Surgeon: Anastasio Auerbach, MD;  Location: [facility];  Service: Gynecology;  Laterality: Bilateral;  request to follow first case at around 10:30am.  Requests 2 1/2 hours for the case.  Marland Kitchen SALIVARY GLAND SURGERY  12-11   LEFT SIDE NODULE REMOVED WAS BENIGN  . TUBAL LIGATION     Social History   Occupational History  . Occupation: International aid/development worker: Psychologist, sport and exercise [place]    Employer: [employer]  Tobacco Use  . Smoking status: Never Smoker  . Smokeless tobacco:  Never Used  Substance and Sexual Activity  . Alcohol use: Yes    Alcohol/week: 0.0 standard drinks    Comment: some  . Drug use: No  . Sexual activity: Yes    Birth control/protection: Surgical, Condom    Comment: tubal ligation,INTERCOUSRE AGE 20, SEXUAL PARTNERS QUESTION DECLINED

## 2019-08-29 ENCOUNTER — Ambulatory Visit
Admission: RE | Admit: 2019-08-29 | Discharge: 2019-08-29 | Disposition: A | Discharge status: Home / Self Care | Payer: BC Managed Care – PPO | Source: Ambulatory Visit | Attending: Internal Medicine | Admitting: Internal Medicine

## 2019-08-29 DIAGNOSIS — M542 Cervicalgia: Secondary | ICD-10-CM

## 2019-08-31 ENCOUNTER — Encounter: Payer: Self-pay | Admitting: Family Medicine

## 2019-08-31 ENCOUNTER — Encounter: Payer: Self-pay | Admitting: Internal Medicine

## 2019-09-05 ENCOUNTER — Ambulatory Visit: Payer: BC Managed Care – PPO | Admitting: Women's Health

## 2019-09-05 ENCOUNTER — Other Ambulatory Visit: Payer: Self-pay

## 2019-09-05 ENCOUNTER — Encounter: Payer: Self-pay | Admitting: Women's Health

## 2019-09-05 VITALS — BP 132/80 | Ht 62.0 in | Wt 178.0 lb

## 2019-09-05 DIAGNOSIS — Z01419 Encounter for gynecological examination (general) (routine) without abnormal findings: Secondary | ICD-10-CM | POA: Diagnosis not present

## 2019-09-05 DIAGNOSIS — N898 Other specified noninflammatory disorders of vagina: Secondary | ICD-10-CM

## 2019-09-05 LAB — WET PREP FOR TRICH, YEAST, CLUE

## 2019-09-05 MED ORDER — FLUCONAZOLE 150 MG PO TABS: 150.0000 mg | ORAL_TABLET | Freq: Once | ORAL | 0 refills | Status: DC

## 2019-09-05 NOTE — Progress Notes (Signed)
[name] 1966-05-05 [id number]    History:    Presents for annual exam.  09/2016 LAVH for fibroids and menorrhagia on no HRT with occasional hot flushes.  Normal Pap and mammogram history.  Mother deceased from colon cancer at age 52 had a negative polyp on colonoscopy 01/2019.  Hypertension primary care manages labs and meds.  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  Same partner with negative STD screen.  Having some mild vaginal irritation with itching.  Past medical history, past surgical history, family history and social history were all reviewed and documented in the EPIC chart.  School bus driver.  Youngest son has schizophrenia doing much better on medication.  Other son lives in [place] doing well.  ROS:  A ROS was performed and pertinent positives and negatives are included.  Exam:  Vitals:   09/05/19 1042  BP: 132/80  Weight: 178 lb (80.7 kg)  Height: 5\' 2"  (1.575 m)   Body mass index is 32.56 kg/m.   General appearance:  Normal Thyroid:  Symmetrical, normal in size, without palpable masses or nodularity. Respiratory  Auscultation:  Clear without wheezing or rhonchi Cardiovascular  Auscultation:  Regular rate, without rubs, murmurs or gallops  Edema/varicosities:  Not grossly evident Abdominal  Soft,nontender, without masses, guarding or rebound.  Liver/spleen:  No organomegaly noted  Hernia:  None appreciated  Skin  Inspection:  Grossly normal   Breasts: Examined lying and sitting.     Right: Without masses, retractions, discharge or axillary adenopathy.     Left: Without masses, retractions, discharge or axillary adenopathy. Gentitourinary   Inguinal/mons:  Normal without inguinal adenopathy  External genitalia:  Normal  BUS/Urethra/Skene's glands:  Normal  Vagina: Mild erythema, wet prep negative   Cervix: And uterus absent   Adnexa/parametria:     Rt: Without masses or tenderness.   Lt: Without  masses or tenderness.  Anus and perineum: Normal  Digital rectal exam: Normal sphincter tone without palpated masses or tenderness  Assessment/Plan:  54 y.o. DBF G2, P2 for annual exam with complaint of vaginal irritation with itching, denies urinary symptoms, abdominal pain or fever.  2018 LAVH for fibroids and menorrhagia on no HRT Vaginal itching/negative wet prep Hypertension-primary care manages labs and meds 01/2019 - colon polyp on colonoscopy/mother deceased colon cancer age 49 Degenerative disc disease and physical therapy  Plan: Reviewed vaginal walls and external genitalia erythemic will try Diflucan 150x1 dose, yeast prevention discussed instructed to call if no relief of symptoms.  SBEs, continue annual 3D screening mammogram, calcium rich foods, vitamin D 2000 IUs daily.  Aware of importance of exercise unable to do at this time due to the degenerative disc disease in her neck.  Pap screening guidelines reviewed.    [place], 11:14 AM 09/05/2019

## 2019-09-05 NOTE — Patient Instructions (Signed)
Good to see you today Vit D 2000 Health Maintenance, Female Adopting a healthy lifestyle and getting preventive care are important in promoting health and wellness. Ask your health care provider about:  The right schedule for you to have regular tests and exams.  Things you can do on your own to prevent diseases and keep yourself healthy. What should I know about diet, weight, and exercise? Eat a healthy diet   Eat a diet that includes plenty of vegetables, fruits, low-fat dairy products, and lean protein.  Do not eat a lot of foods that are high in solid fats, added sugars, or sodium. Maintain a healthy weight Body mass index (BMI) is used to identify weight problems. It estimates body fat based on height and weight. Your health care provider can help determine your BMI and help you achieve or maintain a healthy weight. Get regular exercise Get regular exercise. This is one of the most important things you can do for your health. Most adults should:  Exercise for at least 150 minutes each week. The exercise should increase your heart rate and make you sweat (moderate-intensity exercise).  Do strengthening exercises at least twice a week. This is in addition to the moderate-intensity exercise.  Spend less time sitting. Even light physical activity can be beneficial. Watch cholesterol and blood lipids Have your blood tested for lipids and cholesterol at 52 years of age, then have this test every 5 years. Have your cholesterol levels checked more often if:  Your lipid or cholesterol levels are high.  You are older than 52 years of age.  You are at high risk for heart disease. What should I know about cancer screening? Depending on your health history and family history, you may need to have cancer screening at various ages. This may include screening for:  Breast cancer.  Cervical cancer.  Colorectal cancer.  Skin cancer.  Lung cancer. What should I know about heart  disease, diabetes, and high blood pressure? Blood pressure and heart disease  High blood pressure causes heart disease and increases the risk of stroke. This is more likely to develop in people who have high blood pressure readings, are of African descent, or are overweight.  Have your blood pressure checked: ? Every 3-5 years if you are 4-23 years of age. ? Every year if you are 71 years old or older. Diabetes Have regular diabetes screenings. This checks your fasting blood sugar level. Have the screening done:  Once every three years after age 59 if you are at a normal weight and have a low risk for diabetes.  More often and at a younger age if you are overweight or have a high risk for diabetes. What should I know about preventing infection? Hepatitis B If you have a higher risk for hepatitis B, you should be screened for this virus. Talk with your health care provider to find out if you are at risk for hepatitis B infection. Hepatitis C Testing is recommended for:  Everyone born from [birth year] through 1963.  Anyone with known risk factors for hepatitis C. Sexually transmitted infections (STIs)  Get screened for STIs, including gonorrhea and chlamydia, if: ? You are sexually active and are younger than 52 years of age. ? You are older than 52 years of age and your health care provider tells you that you are at risk for this type of infection. ? Your sexual activity has changed since you were last screened, and you are at increased risk for chlamydia  or gonorrhea. Ask your health care provider if you are at risk.  Ask your health care provider about whether you are at high risk for HIV. Your health care provider may recommend a prescription medicine to help prevent HIV infection. If you choose to take medicine to prevent HIV, you should first get tested for HIV. You should then be tested every 3 months for as long as you are taking the medicine. Pregnancy  If you are about to stop  having your period (premenopausal) and you may become pregnant, seek counseling before you get pregnant.  Take 400 to 800 micrograms (mcg) of folic acid every day if you become pregnant.  Ask for birth control (contraception) if you want to prevent pregnancy. Osteoporosis and menopause Osteoporosis is a disease in which the bones lose minerals and strength with aging. This can result in bone fractures. If you are 37 years old or older, or if you are at risk for osteoporosis and fractures, ask your health care provider if you should:  Be screened for bone loss.  Take a calcium or vitamin D supplement to lower your risk of fractures.  Be given hormone replacement therapy (HRT) to treat symptoms of menopause. Follow these instructions at home: Lifestyle  Do not use any products that contain nicotine or tobacco, such as cigarettes, e-cigarettes, and chewing tobacco. If you need help quitting, ask your health care provider.  Do not use street drugs.  Do not share needles.  Ask your health care provider for help if you need support or information about quitting drugs. Alcohol use  Do not drink alcohol if: ? Your health care provider tells you not to drink. ? You are pregnant, may be pregnant, or are planning to become pregnant.  If you drink alcohol: ? Limit how much you use to 0-1 drink a day. ? Limit intake if you are breastfeeding.  Be aware of how much alcohol is in your drink. In the [place] one drink equals one 12 oz bottle of beer (355 mL), one 5 oz glass of wine (148 mL), or one 1 oz glass of hard liquor (44 mL). General instructions  Schedule regular health, dental, and eye exams.  Stay current with your vaccines.  Tell your health care provider if: ? You often feel depressed. ? You have ever been abused or do not feel safe at home. Summary  Adopting a healthy lifestyle and getting preventive care are important in promoting health and wellness.  Follow your health  care provider's instructions about healthy diet, exercising, and getting tested or screened for diseases.  Follow your health care provider's instructions on monitoring your cholesterol and blood pressure. This information is not intended to replace advice given to you by your health care provider. Make sure you discuss any questions you have with your health care provider. Document Revised: 06/28/2018 Document Reviewed: 06/28/2018 Elsevier Patient Education  2020 [employer].

## 2019-09-06 ENCOUNTER — Ambulatory Visit: Payer: BC Managed Care – PPO | Admitting: Physical Therapy

## 2019-09-10 ENCOUNTER — Encounter: Payer: Self-pay | Admitting: Family Medicine

## 2019-09-10 MED ORDER — TRAMADOL HCL 50 MG PO TABS: 50.0000 mg | ORAL_TABLET | Freq: Four times a day (QID) | ORAL | 0 refills | Status: DC | PRN

## 2019-09-15 ENCOUNTER — Ambulatory Visit: Payer: BC Managed Care – PPO | Attending: Internal Medicine

## 2019-09-15 DIAGNOSIS — Z23 Encounter for immunization: Secondary | ICD-10-CM | POA: Insufficient documentation

## 2019-09-15 NOTE — Progress Notes (Signed)
   Covid-19 Vaccination Clinic  Name:  [name]    MRN: [id number] DOB: 1966/07/06  09/15/2019  [name] was observed post Covid-19 immunization for 15 minutes without incidence. She was provided with Vaccine Information Sheet and instruction to access the V-Safe system.   [name] was instructed to call 911 with any severe reactions post vaccine: Marland Kitchen Difficulty breathing  . Swelling of your face and throat  . A fast heartbeat  . A bad rash all over your body  . Dizziness and weakness    Immunizations Administered    Name Date Dose VIS Date Route   Pfizer COVID-19 Vaccine 09/15/2019  3:30 PM 0.3 mL 06/29/2019 Intramuscular   Manufacturer: [place]   Lot: [id number]   [place]: [id number]

## 2019-09-19 ENCOUNTER — Other Ambulatory Visit: Payer: Self-pay

## 2019-09-19 ENCOUNTER — Encounter: Payer: Self-pay | Admitting: Physical Therapy

## 2019-09-19 ENCOUNTER — Ambulatory Visit: Payer: BC Managed Care – PPO | Admitting: Physical Therapy

## 2019-09-19 DIAGNOSIS — M25512 Pain in left shoulder: Secondary | ICD-10-CM | POA: Diagnosis not present

## 2019-09-19 DIAGNOSIS — M542 Cervicalgia: Secondary | ICD-10-CM | POA: Diagnosis not present

## 2019-09-19 DIAGNOSIS — M6281 Muscle weakness (generalized): Secondary | ICD-10-CM | POA: Diagnosis not present

## 2019-09-19 DIAGNOSIS — M546 Pain in thoracic spine: Secondary | ICD-10-CM

## 2019-09-19 DIAGNOSIS — M25511 Pain in right shoulder: Secondary | ICD-10-CM | POA: Diagnosis not present

## 2019-09-19 DIAGNOSIS — G8929 Other chronic pain: Secondary | ICD-10-CM

## 2019-09-19 NOTE — Therapy (Signed)
[facility] Physical Therapy [address] [place], [place], [address] Phone: [phone]   Fax:  [phone]  Physical Therapy Evaluation  Patient Details  Name: [name] MRN: [id number] Date of Birth: 28-Jan-1966 Referring Provider (PT): Eunice Blase, MD   Encounter Date: 09/19/2019  PT End of Session - 09/19/19 0813    Visit Number  1    Number of Visits  12    Date for PT Re-Evaluation  11/07/19    PT Start Time  0800    PT Stop Time  0845    PT Time Calculation (min)  45 min    Activity Tolerance  Patient tolerated treatment well    Behavior During Therapy  [facility] for tasks assessed/performed       Past Medical History:  Diagnosis Date  . Allergic rhinitis   . Allergy   . Anxiety   . Depression   . GERD (gastroesophageal reflux disease)   . Hyperlipidemia   . Hyperplastic colon polyp 10/2003  . Hypertension   . Menorrhagia   . Obesity   . PONV (postoperative nausea and vomiting)   . RLS (restless legs syndrome)     Past Surgical History:  Procedure Laterality Date  . COLONOSCOPY    . CYSTOSCOPY N/A 10/12/2016   Procedure: CYSTOSCOPY;  Surgeon: Anastasio Auerbach, MD;  Location: [facility];  Service: Gynecology;  Laterality: N/A;  . ENDOMETRIAL ABLATION  2007   Dr. Landry Mellow at [facility]  . FOOT SURGERY    . LAPAROSCOPIC VAGINAL HYSTERECTOMY WITH SALPINGECTOMY Bilateral 10/12/2016   Procedure: LAPAROSCOPIC ASSISTED VAGINAL HYSTERECTOMY WITH SALPINGECTOMY;  Surgeon: Anastasio Auerbach, MD;  Location: [facility];  Service: Gynecology;  Laterality: Bilateral;  request to follow first case at around 10:30am.  Requests 2 1/2 hours for the case.  Marland Kitchen SALIVARY GLAND SURGERY  12-11   LEFT SIDE NODULE REMOVED WAS BENIGN  . TUBAL LIGATION      There were no vitals filed for this visit.   Subjective Assessment - 09/19/19 0808    Subjective  Pt arrving to therapy reporting neck pain and mid back which radiates into bilateral arms but reports it's worse on  the left side. 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 Pt unable to recall specific event or trauma.    Patient Stated Goals  Stop hurting, Sleep better    Currently in Pain?  Yes    Pain Score  4     Pain Location  Neck    Pain Orientation  Left;Upper;Mid    Pain Descriptors / Indicators  Aching    Pain Type  Chronic pain    Pain Radiating Towards  down arms at times    Pain Onset  More than a month ago    Pain Frequency  Intermittent    Aggravating Factors   sitting long periods, driving    Pain Relieving Factors  lying down    Effect of Pain on Daily Activities  difficulty sleeping, driving, unable to work         [facility] PT Assessment - 09/19/19 0001      Assessment   Medical Diagnosis  M54.2 neck pain, thoracic pain    Referring Provider (PT)  Eunice Blase, MD    Onset Date/Surgical Date  --   october 2020   Hand Dominance  Right    Prior Therapy  no      Precautions   Precautions  None  Restrictions   Weight Bearing Restrictions  No      Balance Screen   Has the patient fallen in the past 6 months  No    Is the patient reluctant to leave their home because of a fear of falling?   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      Cognition   Overall Cognitive Status  Within Functional Limits for tasks assessed      Posture/Postural Control   Posture/Postural Control  Postural limitations    Postural Limitations  Rounded Shoulders;Forward head      ROM / Strength   AROM / PROM / Strength  AROM;Strength      AROM   Overall AROM   Deficits    AROM Assessment Site  Shoulder;Cervical    Right/Left Shoulder  Right;Left     Right Shoulder Extension  30 Degrees    Right Shoulder Flexion  130 Degrees    Right Shoulder ABduction  124 Degrees    Right Shoulder Internal Rotation  --   thumb to T10   Right Shoulder External Rotation  45 Degrees    Left Shoulder Extension  32 Degrees    Left Shoulder Flexion  135 Degrees    Left Shoulder ABduction  118 Degrees    Left Shoulder Internal Rotation  --   thumb to T12   Left Shoulder External Rotation  60 Degrees    Cervical Flexion  10   painful   Cervical Extension  15   painful   Cervical - Right Side Bend  20   stiffness note   Cervical - Left Side Bend  8   increased pain   Cervical - Right Rotation  25   painful    Cervical - Left Rotation  25   painful     Strength   Overall Strength Comments  R: 23.2 ppsi, L: 7.8 ppsi      Palpation   Palpation comment  TTP bilateral uppert trap, mid trap and left cervical paraspinals.       Special Tests    Special Tests  --      Transfers   Five time sit to stand comments   24 seconds with UE support      Ambulation/Gait   Gait Comments  Pt with decreased arm swing bilaterally and decreased trunk dissociation                Objective measurements completed on examination: See above findings.              PT Education - 09/19/19 [id number]    Education Details  PT POC, HEP, Discussed DN at future visit.    Person(s) Educated  Patient    Methods  Explanation;Demonstration;Other (comment)    Comprehension  Verbalized understanding;Returned demonstration                  Plan - 09/19/19 1609    Clinical Impression Statement  Pt arriving to therapy reporting pain in her neck, mid back and lower back.  Pt also reporting L shoulder pain and pain radiation down her arms hich is worse on the left. Pt with limited ROM in bilateral shoulders and cervical spine. Pt with active trigger points noted in bilateral upper trap, mid trap and left cervical paraspinals. Pt with decreased grip  strength bilaterally but increasing worse on the left when compared to the right (R: 23.2 ppsi, L: 7.8 ppsi). I feel pt could benefit from trigger point dry needling at future visit.  Pt could benefit from skilled PT to progress toward her indepdent PROM and functional mobilty. Pt's goal is to return to driving a school bus and perform ADL's  without pain.    Examination-Activity Limitations  Sit;Lift;Reach Overhead    Examination-Participation Restrictions  Other;Driving    Stability/Clinical Decision Making  Evolving/Moderate complexity    Clinical Decision Making  Moderate    Rehab Potential  Good    PT Frequency  2x / week    PT Duration  6 weeks    PT Treatment/Interventions  ADLs/Self Care Home Management;Cryotherapy;Electrical Stimulation;Iontophoresis 4mg /ml Dexamethasone;Moist Heat;Traction;Ultrasound;Functional mobility training;Stair training;Gait training;Therapeutic activities;Therapeutic exercise;Balance training;Neuromuscular re-education;Patient/family education;Manual techniques;Taping;Passive range of motion;Dry needling    PT Next Visit Plan  Cervical and shoulder ROM, IASTM, grade 2-3 cervical mobs, scapular mobs, Continue to discuss DN, pt reposonded well to heat at end of treatment    PT Home Exercise Plan  [id number]    Consulted and Agree with Plan of Care  Patient       Patient will benefit from skilled therapeutic intervention in order to improve the following deficits and impairments:  Pain, Postural dysfunction, Decreased activity tolerance, Impaired flexibility, Decreased strength, Decreased range of motion, Decreased mobility  Visit Diagnosis: Cervicalgia  Chronic left shoulder pain  Chronic right shoulder pain  Muscle weakness (generalized)  Pain in thoracic spine     Problem List Patient Active Problem List   Diagnosis Date Noted  . Neck pain on left side 08/15/2019  . B12 deficiency 06/02/2019  . Vertigo 04/20/2019  . Closed head injury 04/18/2019   . Dysphagia 11/13/2018  . Epigastric pain 11/13/2018  . Anemia 11/13/2018  . Asthma 10/10/2018  . Acute upper respiratory infection 09/18/2018  . Bad breath 05/25/2016  . Abnormal TSH 05/09/2015  . Other and unspecified ovarian cyst 05/30/2013  . Chronic constipation 04/17/2013  . Preventative health care 10/12/2010  . Hyperlipidemia 03/26/2007  . Anxiety state 03/26/2007  . Depression 03/26/2007  . Essential hypertension 03/26/2007  . Allergic rhinitis 03/26/2007  . GERD 03/26/2007  . IBS 03/26/2007  . COLONIC POLYPS, HX OF 03/26/2007    Oretha Caprice, PT 09/19/2019, 4:22 PM  [facility] Physical Therapy [address] [place], [place], [address] Phone: [phone]   Fax:  [phone]  Name: [name] MRN: [id number] Date of Birth: [birth year]

## 2019-09-19 NOTE — Patient Instructions (Signed)
Access Code: [email or web address] URL: [email or web address] Date: 09/19/2019 Prepared by: Kearney Hard  Exercises Seated Cervical Retraction - 10 reps - 5 seconds hold - 3x daily - 7x weekly Standing Lower Cervical and Upper Thoracic Stretch - 5 reps - 3 sets - 10 seconds hold - 3x daily - 7x weekly Seated Cervical Rotation AROM - 5 reps - 10 seconds hold - 3x daily - 7x weekly

## 2019-09-25 ENCOUNTER — Other Ambulatory Visit: Payer: Self-pay | Admitting: Family Medicine

## 2019-09-25 ENCOUNTER — Encounter: Payer: Self-pay | Admitting: Family Medicine

## 2019-09-26 ENCOUNTER — Encounter: Payer: Self-pay | Admitting: Physical Therapy

## 2019-09-26 ENCOUNTER — Ambulatory Visit: Payer: BC Managed Care – PPO | Admitting: Physical Therapy

## 2019-09-26 ENCOUNTER — Encounter: Payer: Self-pay | Admitting: Family Medicine

## 2019-09-26 ENCOUNTER — Other Ambulatory Visit: Payer: Self-pay

## 2019-09-26 DIAGNOSIS — M25511 Pain in right shoulder: Secondary | ICD-10-CM

## 2019-09-26 DIAGNOSIS — M546 Pain in thoracic spine: Secondary | ICD-10-CM | POA: Diagnosis not present

## 2019-09-26 DIAGNOSIS — M542 Cervicalgia: Secondary | ICD-10-CM

## 2019-09-26 DIAGNOSIS — G8929 Other chronic pain: Secondary | ICD-10-CM

## 2019-09-26 DIAGNOSIS — M25512 Pain in left shoulder: Secondary | ICD-10-CM

## 2019-09-26 DIAGNOSIS — M6281 Muscle weakness (generalized): Secondary | ICD-10-CM

## 2019-09-26 NOTE — Patient Instructions (Signed)

## 2019-09-26 NOTE — Therapy (Signed)
[facility] Physical Therapy [address] [place], [place], [address] Phone: [phone]   Fax:  [phone]  Physical Therapy Treatment  Patient Details  Name: [name] MRN: [id number] Date of Birth: August 11, 1965 Referring Provider (PT): Eunice Blase, MD   Encounter Date: 09/26/2019  PT End of Session - 09/26/19 1052    Visit Number  2    Number of Visits  12    Date for PT Re-Evaluation  11/07/19    PT Start Time  [id number]    PT Stop Time  1056    PT Time Calculation (min)  41 min    Activity Tolerance  Patient tolerated treatment well    Behavior During Therapy  [facility] for tasks assessed/performed       Past Medical History:  Diagnosis Date  . Allergic rhinitis   . Allergy   . Anxiety   . Depression   . GERD (gastroesophageal reflux disease)   . Hyperlipidemia   . Hyperplastic colon polyp 10/2003  . Hypertension   . Menorrhagia   . Obesity   . PONV (postoperative nausea and vomiting)   . RLS (restless legs syndrome)     Past Surgical History:  Procedure Laterality Date  . COLONOSCOPY    . CYSTOSCOPY N/A 10/12/2016   Procedure: CYSTOSCOPY;  Surgeon: Anastasio Auerbach, MD;  Location: [facility];  Service: Gynecology;  Laterality: N/A;  . ENDOMETRIAL ABLATION  2007   Dr. Landry Mellow at [facility]  . FOOT SURGERY    . LAPAROSCOPIC VAGINAL HYSTERECTOMY WITH SALPINGECTOMY Bilateral 10/12/2016   Procedure: LAPAROSCOPIC ASSISTED VAGINAL HYSTERECTOMY WITH SALPINGECTOMY;  Surgeon: Anastasio Auerbach, MD;  Location: [facility];  Service: Gynecology;  Laterality: Bilateral;  request to follow first case at around 10:30am.  Requests 2 1/2 hours for the case.  Marland Kitchen SALIVARY GLAND SURGERY  12-11   LEFT SIDE NODULE REMOVED WAS BENIGN  . TUBAL LIGATION      There were no vitals filed for this visit.  Subjective Assessment - 09/26/19 1016    Subjective  comes in very guarded today.  everytime she turns her head it's popping so she wants to keep it still.    Patient  Stated Goals  Stop hurting, Sleep better    Currently in Pain?  Yes    Pain Score  5     Pain Location  Neck    Pain Orientation  Left    Pain Descriptors / Indicators  Aching    Pain Type  Chronic pain    Pain Radiating Towards  down arms at times    Pain Onset  More than a month ago    Pain Frequency  Intermittent    Aggravating Factors   sitting long periods, driving    Pain Relieving Factors  lying down                       [facility] Adult PT Treatment/Exercise - 09/26/19 1017      Exercises   Exercises  Neck      Neck Exercises: Machines for Strengthening   UBE (Upper Arm Bike)  L1 x 2 min (1' each direction - very slow)      Neck Exercises: Supine   Neck Retraction  10 reps;5 secs    Neck Retraction Limitations  small range due to increased pain    Cervical Rotation  Both;10 reps    Cervical Rotation Limitations  small range due to pain  Modalities   Modalities  Moist Heat;Electrical Stimulation      Moist Heat Therapy   Number Minutes Moist Heat  10 Minutes    Moist Heat Location  Cervical      Electrical Stimulation   Electrical Stimulation Location  bil neck/upper trap    Electrical Stimulation Action  IFC    Electrical Stimulation Parameters  to tolerance    Electrical Stimulation Goals  Tone;Pain      Manual Therapy   Manual Therapy  Soft tissue mobilization    Manual therapy comments  skilled palpation and monitoring of soft tissue during DN    Soft tissue mobilization  bil cervical paraspinals; bil upper trap       Trigger Point Dry Needling - 09/26/19 1051    Consent Given?  Yes    Education Handout Provided  Yes    Muscles Treated Head and Neck  Upper trapezius;Splenius capitus;Semispinalis capitus    Upper Trapezius Response  Twitch reponse elicited;Palpable increased muscle length    Splenius capitus Response  Twitch reponse elicited;Palpable increased muscle length    Semispinalis capitus Response  Twitch reponse  elicited;Palpable increased muscle length           PT Education - 09/26/19 1052    Education Details  DN    Person(s) Educated  Patient    Methods  Explanation;Demonstration;Handout    Comprehension  Verbalized understanding;Need further instruction                 Plan - 09/26/19 1057    Clinical Impression Statement  Pt arrived today with very guarded posture due to increased pain, with trial of DN and manual therapy today following up with heat and IFC.  Will continue to benefit from PT to maximize function.    Examination-Activity Limitations  Sit;Lift;Reach Overhead    Examination-Participation Restrictions  Other;Driving    Stability/Clinical Decision Making  Evolving/Moderate complexity    Rehab Potential  Good    PT Frequency  2x / week    PT Duration  6 weeks    PT Treatment/Interventions  ADLs/Self Care Home Management;Cryotherapy;Electrical Stimulation;Iontophoresis 4mg /ml Dexamethasone;Moist Heat;Traction;Ultrasound;Functional mobility training;Stair training;Gait training;Therapeutic activities;Therapeutic exercise;Balance training;Neuromuscular re-education;Patient/family education;Manual techniques;Taping;Passive range of motion;Dry needling    PT Next Visit Plan  assess response to DN/manual, continue with gentle supine exercises, manual/modalities PRN    PT Home Exercise Plan  [id number]    Consulted and Agree with Plan of Care  Patient       Patient will benefit from skilled therapeutic intervention in order to improve the following deficits and impairments:  Pain, Postural dysfunction, Decreased activity tolerance, Impaired flexibility, Decreased strength, Decreased range of motion, Decreased mobility  Visit Diagnosis: Cervicalgia  Chronic left shoulder pain  Chronic right shoulder pain  Pain in thoracic spine  Muscle weakness (generalized)     Problem List Patient Active Problem List   Diagnosis Date Noted  . Neck pain on left side  08/15/2019  . B12 deficiency 06/02/2019  . Vertigo 04/20/2019  . Closed head injury 04/18/2019  . Dysphagia 11/13/2018  . Epigastric pain 11/13/2018  . Anemia 11/13/2018  . Asthma 10/10/2018  . Acute upper respiratory infection 09/18/2018  . Bad breath 05/25/2016  . Abnormal TSH 05/09/2015  . Other and unspecified ovarian cyst 05/30/2013  . Chronic constipation 04/17/2013  . Preventative health care 10/12/2010  . Hyperlipidemia 03/26/2007  . Anxiety state 03/26/2007  . Depression 03/26/2007  . Essential hypertension 03/26/2007  . Allergic rhinitis 03/26/2007  .  GERD 03/26/2007  . IBS 03/26/2007  . COLONIC POLYPS, HX OF 03/26/2007      Laureen Abrahams, PT, DPT 09/26/19 11:01 AM     [facility] Physical Therapy [address] [place], [place], [address] Phone: [phone]   Fax:  [phone]  Name: GWENDOLYNNE DEBONIS MRN: [id number] Date of Birth: 01-23-1966

## 2019-09-28 ENCOUNTER — Encounter: Payer: BC Managed Care – PPO | Admitting: Physical Therapy

## 2019-09-28 ENCOUNTER — Encounter: Payer: Self-pay | Admitting: Family Medicine

## 2019-09-28 ENCOUNTER — Telehealth: Payer: Self-pay | Admitting: Family Medicine

## 2019-09-28 NOTE — Telephone Encounter (Signed)
IC LMVM for patient advising could access note through [employer]

## 2019-09-28 NOTE — Telephone Encounter (Signed)
AK from [employer] called this morning stating that they received a physician statement from [facility], but didn't take the patient out of work, but stated that she needed to be out of work for 5-6 weeks.  She is needing some clarification.  CB#[phone].  Thank you.

## 2019-09-28 NOTE — Telephone Encounter (Signed)
Please advise on this. The letter she was given has her out until 4/09.

## 2019-09-28 NOTE — Telephone Encounter (Signed)
New note printed

## 2019-10-01 ENCOUNTER — Encounter: Payer: Self-pay | Admitting: Family Medicine

## 2019-10-02 ENCOUNTER — Encounter: Payer: Self-pay | Admitting: Physical Therapy

## 2019-10-02 ENCOUNTER — Other Ambulatory Visit: Payer: Self-pay

## 2019-10-02 ENCOUNTER — Ambulatory Visit (INDEPENDENT_AMBULATORY_CARE_PROVIDER_SITE_OTHER): Payer: BC Managed Care – PPO | Admitting: Physical Therapy

## 2019-10-02 DIAGNOSIS — M25512 Pain in left shoulder: Secondary | ICD-10-CM | POA: Diagnosis not present

## 2019-10-02 DIAGNOSIS — M542 Cervicalgia: Secondary | ICD-10-CM

## 2019-10-02 DIAGNOSIS — M546 Pain in thoracic spine: Secondary | ICD-10-CM | POA: Diagnosis not present

## 2019-10-02 DIAGNOSIS — M25511 Pain in right shoulder: Secondary | ICD-10-CM | POA: Diagnosis not present

## 2019-10-02 DIAGNOSIS — G8929 Other chronic pain: Secondary | ICD-10-CM

## 2019-10-02 DIAGNOSIS — M6281 Muscle weakness (generalized): Secondary | ICD-10-CM

## 2019-10-02 NOTE — Therapy (Signed)
[facility] Physical Therapy [address] [place], [place], [address] Phone: [phone]   Fax:  [phone]  Physical Therapy Treatment  Patient Details  Name: [name] MRN: [id number] Date of Birth: September 29, 1965 Referring Provider (PT): Eunice Blase, MD   Encounter Date: 10/02/2019  PT End of Session - 10/02/19 1056    Visit Number  3    Number of Visits  12    Date for PT Re-Evaluation  11/07/19    PT Start Time  [id number]    PT Stop Time  1107    PT Time Calculation (min)  52 min    Activity Tolerance  Patient tolerated treatment well    Behavior During Therapy  [facility] for tasks assessed/performed       Past Medical History:  Diagnosis Date  . Allergic rhinitis   . Allergy   . Anxiety   . Depression   . GERD (gastroesophageal reflux disease)   . Hyperlipidemia   . Hyperplastic colon polyp 10/2003  . Hypertension   . Menorrhagia   . Obesity   . PONV (postoperative nausea and vomiting)   . RLS (restless legs syndrome)     Past Surgical History:  Procedure Laterality Date  . COLONOSCOPY    . CYSTOSCOPY N/A 10/12/2016   Procedure: CYSTOSCOPY;  Surgeon: Anastasio Auerbach, MD;  Location: [facility];  Service: Gynecology;  Laterality: N/A;  . ENDOMETRIAL ABLATION  2007   Dr. Landry Mellow at [facility]  . FOOT SURGERY    . LAPAROSCOPIC VAGINAL HYSTERECTOMY WITH SALPINGECTOMY Bilateral 10/12/2016   Procedure: LAPAROSCOPIC ASSISTED VAGINAL HYSTERECTOMY WITH SALPINGECTOMY;  Surgeon: Anastasio Auerbach, MD;  Location: [facility];  Service: Gynecology;  Laterality: Bilateral;  request to follow first case at around 10:30am.  Requests 2 1/2 hours for the case.  Marland Kitchen SALIVARY GLAND SURGERY  12-11   LEFT SIDE NODULE REMOVED WAS BENIGN  . TUBAL LIGATION      There were no vitals filed for this visit.  Subjective Assessment - 10/02/19 1015    Subjective  "yesterday was a bad day. i was having spasms in my shoulders and back."  today is a better day.  pain is still  intermittent - and not sure if PT has been beneficial yet.    Patient Stated Goals  Stop hurting, Sleep better    Currently in Pain?  Yes    Pain Score  4     Pain Location  Neck    Pain Orientation  Left    Pain Descriptors / Indicators  Aching    Pain Type  Chronic pain    Pain Onset  More than a month ago    Pain Frequency  Intermittent    Aggravating Factors   sitting long periods, driving, turning head    Pain Relieving Factors  lying down                       [facility] Adult PT Treatment/Exercise - 10/02/19 1025      Neck Exercises: Seated   Cervical Rotation  Right;Left;10 reps    Cervical Rotation Limitations  3-5 sec hold    Shoulder Rolls  Backwards;15 reps      Moist Heat Therapy   Number Minutes Moist Heat  12 Minutes    Moist Heat Location  Cervical      Electrical Stimulation   Electrical Stimulation Location  bil neck/upper trap    Electrical Stimulation Action  IFC  Electrical Stimulation Parameters  to tolerance    Electrical Stimulation Goals  Tone;Pain      Manual Therapy   Manual Therapy  Soft tissue mobilization    Manual therapy comments  skilled palpation and monitoring of soft tissue during DN    Soft tissue mobilization  bil cervical paraspinals; bil upper trap and levator scapula; suboccipital release      Neck Exercises: Stretches   Other Neck Stretches  towel rotation stretch 3x10 sec hold each direction       Trigger Point Dry Needling - 10/02/19 1055    Consent Given?  Yes    Education Handout Provided  Previously provided    Muscles Treated Head and Neck  Splenius capitus;Semispinalis capitus;Levator scapulae;Suboccipitals    Suboccipitals Response  Twitch response elicited;Palpable increased muscle length    Levator Scapulae Response  Twitch response elicited;Palpable increased muscle length    Splenius capitus Response  Twitch reponse elicited;Palpable increased muscle length    Semispinalis capitus Response  Twitch  reponse elicited;Palpable increased muscle length                PT Long Term Goals - 09/26/19 1103      PT LONG TERM GOAL #1   Title  Pt will be independent in her HEP and progression.    Time  6    Status  New      PT LONG TERM GOAL #2   Title  Pt will be able to improve bilateral shoulder flexion to >/= 160 degrees.    Baseline  see flow sheets    Time  6    Period  Weeks    Status  New      PT LONG TERM GOAL #3   Title  Pt will be able to increase her cervical rotation to >/= 40 degrees bilaterally to improve function and driving.    Baseline  see flow sheets    Time  6    Period  Weeks    Status  New      PT LONG TERM GOAL #4   Title  Pt will be able to report driving more than 30 minutes with pain </= 3/10.    Baseline  currently pain varies.    Time  6    Period  Weeks    Status  New            Plan - 10/02/19 1056    Clinical Impression Statement  Pt continues to have intermittent pain that is variable at this time.  Limited carryover between sessions, and discussed possible follow up with MD.  Goals are ongoing at this time.    Examination-Activity Limitations  Sit;Lift;Reach Overhead    Examination-Participation Restrictions  Other;Driving    Stability/Clinical Decision Making  Evolving/Moderate complexity    Rehab Potential  Good    PT Frequency  2x / week    PT Duration  6 weeks    PT Treatment/Interventions  ADLs/Self Care Home Management;Cryotherapy;Electrical Stimulation;Iontophoresis 4mg /ml Dexamethasone;Moist Heat;Traction;Ultrasound;Functional mobility training;Stair training;Gait training;Therapeutic activities;Therapeutic exercise;Balance training;Neuromuscular re-education;Patient/family education;Manual techniques;Taping;Passive range of motion;Dry needling    PT Next Visit Plan  assess response to DN/manual, continue with gentle supine exercises, manual/modalities PRN    PT Home Exercise Plan  [id number]    Consulted and Agree with Plan  of Care  Patient       Patient will benefit from skilled therapeutic intervention in order to improve the following deficits and impairments:  Pain, Postural dysfunction, Decreased  activity tolerance, Impaired flexibility, Decreased strength, Decreased range of motion, Decreased mobility  Visit Diagnosis: Cervicalgia  Chronic left shoulder pain  Chronic right shoulder pain  Pain in thoracic spine  Muscle weakness (generalized)     Problem List Patient Active Problem List   Diagnosis Date Noted  . Neck pain on left side 08/15/2019  . B12 deficiency 06/02/2019  . Vertigo 04/20/2019  . Closed head injury 04/18/2019  . Dysphagia 11/13/2018  . Epigastric pain 11/13/2018  . Anemia 11/13/2018  . Asthma 10/10/2018  . Acute upper respiratory infection 09/18/2018  . Bad breath 05/25/2016  . Abnormal TSH 05/09/2015  . Other and unspecified ovarian cyst 05/30/2013  . Chronic constipation 04/17/2013  . Preventative health care 10/12/2010  . Hyperlipidemia 03/26/2007  . Anxiety state 03/26/2007  . Depression 03/26/2007  . Essential hypertension 03/26/2007  . Allergic rhinitis 03/26/2007  . GERD 03/26/2007  . IBS 03/26/2007  . COLONIC POLYPS, HX OF 03/26/2007      Laureen Abrahams, PT, DPT 10/02/19 10:58 AM     [facility] Physical Therapy [address] [place], [place], [address] Phone: [phone]   Fax:  [phone]  Name: [name] MRN: [id number] Date of Birth: 05-10-1966

## 2019-10-03 ENCOUNTER — Encounter: Payer: Self-pay | Admitting: Family Medicine

## 2019-10-04 ENCOUNTER — Encounter: Payer: BC Managed Care – PPO | Admitting: Physical Therapy

## 2019-10-06 ENCOUNTER — Ambulatory Visit: Payer: BC Managed Care – PPO | Attending: Internal Medicine

## 2019-10-06 DIAGNOSIS — Z23 Encounter for immunization: Secondary | ICD-10-CM

## 2019-10-06 NOTE — Progress Notes (Signed)
   Covid-19 Vaccination Clinic  Name:  [name]    MRN: [id number] DOB: [birth year]  10/06/2019  [name] was observed post Covid-19 immunization for 15 minutes without incident. She was provided with Vaccine Information Sheet and instruction to access the V-Safe system.   [name] was instructed to call 911 with any severe reactions post vaccine: Marland Kitchen Difficulty breathing  . Swelling of face and throat  . A fast heartbeat  . A bad rash all over body  . Dizziness and weakness   Immunizations Administered    Name Date Dose VIS Date Route   Pfizer COVID-19 Vaccine 10/06/2019  9:12 AM 0.3 mL 06/29/2019 Intramuscular   Manufacturer: [place]   Lot: [id number]   [place]: [id number]

## 2019-10-08 ENCOUNTER — Ambulatory Visit: Payer: BC Managed Care – PPO | Admitting: Family Medicine

## 2019-10-08 ENCOUNTER — Telehealth: Payer: Self-pay | Admitting: *Deleted

## 2019-10-08 ENCOUNTER — Encounter: Payer: Self-pay | Admitting: Family Medicine

## 2019-10-08 ENCOUNTER — Other Ambulatory Visit: Payer: Self-pay

## 2019-10-08 DIAGNOSIS — M542 Cervicalgia: Secondary | ICD-10-CM | POA: Diagnosis not present

## 2019-10-08 NOTE — Progress Notes (Signed)
Office Visit Note   Patient: [name]           Date of Birth: August 29, 1965           MRN: [id number] Visit Date: 10/08/2019 Requested by: Biagio Borg, MD [address] [place],  [place] [address] PCP: Biagio Borg, MD  Subjective: Chief Complaint  Patient presents with  . Neck - Follow-up    HPI: She is here with persistent left-sided neck pain.  Physical therapy helps temporarily but she thinks that she needs additional relief.  Medicines help, she tries to use them sparingly.  She gets occasional numbness and tingling in her left arm.              ROS:   All other systems were reviewed and are negative.  Objective: Vital Signs: LMP 10/01/2016   Physical Exam:  General:  Alert and oriented, in no acute distress. Pulm:  Breathing unlabored. Psy:  Normal mood, congruent affect.  Neck: She is tender to palpation to the left of midline at around C3-4, C4-5.  Pain with rotation to the left.  Upper extremity strength remains normal.  Imaging: None today  Assessment & Plan: 1.  Persistent left-sided neck pain with cervical disc bulges and facet arthropathy -We will refer her to Dr. Ernestina Patches for epidural steroid injection.  Continue with physical therapy.  Call for refills when needed.     Procedures: No procedures performed  No notes on file     PMFS History: Patient Active Problem List   Diagnosis Date Noted  . Neck pain on left side 08/15/2019  . B12 deficiency 06/02/2019  . Vertigo 04/20/2019  . Closed head injury 04/18/2019  . Dysphagia 11/13/2018  . Epigastric pain 11/13/2018  . Anemia 11/13/2018  . Asthma 10/10/2018  . Acute upper respiratory infection 09/18/2018  . Bad breath 05/25/2016  . Abnormal TSH 05/09/2015  . Other and unspecified ovarian cyst 05/30/2013  . Chronic constipation 04/17/2013  . Preventative health care 10/12/2010  . Hyperlipidemia 03/26/2007  . Anxiety state 03/26/2007  . Depression 03/26/2007  . Essential hypertension  03/26/2007  . Allergic rhinitis 03/26/2007  . GERD 03/26/2007  . IBS 03/26/2007  . COLONIC POLYPS, HX OF 03/26/2007   Past Medical History:  Diagnosis Date  . Allergic rhinitis   . Allergy   . Anxiety   . Depression   . GERD (gastroesophageal reflux disease)   . Hyperlipidemia   . Hyperplastic colon polyp 10/2003  . Hypertension   . Menorrhagia   . Obesity   . PONV (postoperative nausea and vomiting)   . RLS (restless legs syndrome)     Family History  Problem Relation Age of Onset  . Cancer Mother        colon cancer at 36 yo  . Colon cancer Mother   . Colon polyps Father   . Hyperlipidemia Other   . Hypertension Other   . Heart attack Maternal Grandfather   . Esophageal cancer Neg Hx   . Rectal cancer Neg Hx   . Stomach cancer Neg Hx     Past Surgical History:  Procedure Laterality Date  . COLONOSCOPY    . CYSTOSCOPY N/A 10/12/2016   Procedure: CYSTOSCOPY;  Surgeon: Anastasio Auerbach, MD;  Location: [facility];  Service: Gynecology;  Laterality: N/A;  . ENDOMETRIAL ABLATION  2007   Dr. Landry Mellow at [facility]  . FOOT SURGERY    . LAPAROSCOPIC VAGINAL HYSTERECTOMY WITH SALPINGECTOMY Bilateral 10/12/2016  Procedure: LAPAROSCOPIC ASSISTED VAGINAL HYSTERECTOMY WITH SALPINGECTOMY;  Surgeon: Anastasio Auerbach, MD;  Location: [facility];  Service: Gynecology;  Laterality: Bilateral;  request to follow first case at around 10:30am.  Requests 2 1/2 hours for the case.  Marland Kitchen SALIVARY GLAND SURGERY  12-11   LEFT SIDE NODULE REMOVED WAS BENIGN  . TUBAL LIGATION     Social History   Occupational History  . Occupation: International aid/development worker: Psychologist, sport and exercise [employer]    Employer: [employer]  Tobacco Use  . Smoking status: Never Smoker  . Smokeless tobacco: Never Used  Substance and Sexual Activity  . Alcohol use: Not Currently    Alcohol/week: 0.0 standard drinks  . Drug use: No  . Sexual activity: Not Currently    Birth control/protection: Surgical, Condom    Comment:  tubal ligation,INTERCOUSRE AGE 19, SEXUAL PARTNERS QUESTION DECLINED

## 2019-10-09 ENCOUNTER — Encounter: Payer: BC Managed Care – PPO | Admitting: Physical Therapy

## 2019-10-09 ENCOUNTER — Other Ambulatory Visit: Payer: Self-pay | Admitting: Physical Medicine and Rehabilitation

## 2019-10-09 DIAGNOSIS — F411 Generalized anxiety disorder: Secondary | ICD-10-CM

## 2019-10-09 MED ORDER — DIAZEPAM 5 MG PO TABS: ORAL_TABLET | ORAL | 0 refills | Status: DC

## 2019-10-09 NOTE — Progress Notes (Signed)
Pre-procedure diazepam ordered for pre-operative anxiety.  

## 2019-10-10 ENCOUNTER — Telehealth: Payer: Self-pay | Admitting: Family Medicine

## 2019-10-10 NOTE — Telephone Encounter (Signed)
Received call from patient,she stated [facility] needs her records. I advised the last relase form she signed with [employer] for them expired 2/22. We need a new release form, she will come by office and sign release form and I will fax records for her.

## 2019-10-11 ENCOUNTER — Encounter: Payer: Self-pay | Admitting: Family Medicine

## 2019-10-11 ENCOUNTER — Encounter: Payer: BC Managed Care – PPO | Admitting: Physical Therapy

## 2019-10-16 ENCOUNTER — Ambulatory Visit: Payer: BC Managed Care – PPO | Admitting: Physical Therapy

## 2019-10-16 ENCOUNTER — Encounter: Payer: Self-pay | Admitting: Physical Therapy

## 2019-10-16 ENCOUNTER — Other Ambulatory Visit: Payer: Self-pay

## 2019-10-16 DIAGNOSIS — G8929 Other chronic pain: Secondary | ICD-10-CM

## 2019-10-16 DIAGNOSIS — M546 Pain in thoracic spine: Secondary | ICD-10-CM | POA: Diagnosis not present

## 2019-10-16 DIAGNOSIS — M25511 Pain in right shoulder: Secondary | ICD-10-CM

## 2019-10-16 DIAGNOSIS — M25512 Pain in left shoulder: Secondary | ICD-10-CM

## 2019-10-16 DIAGNOSIS — M542 Cervicalgia: Secondary | ICD-10-CM

## 2019-10-16 DIAGNOSIS — M6281 Muscle weakness (generalized): Secondary | ICD-10-CM

## 2019-10-16 NOTE — Therapy (Signed)
[facility] [address] [place], [place], [address] Phone: [phone]   Fax:  [phone]  Physical Therapy Treatment  Patient Details  Name: [name] MRN: [id number] Date of Birth: Jul 09, 1966 Referring Provider (PT): Eunice Blase, MD   Encounter Date: 10/16/2019  PT End of Session - 10/16/19 1049    Visit Number  4    Number of Visits  12    Date for PT Re-Evaluation  11/07/19    PT Start Time  1022    PT Stop Time  1103    PT Time Calculation (min)  41 min    Activity Tolerance  Patient tolerated treatment well    Behavior During Therapy  [facility] for tasks assessed/performed       Past Medical History:  Diagnosis Date  . Allergic rhinitis   . Allergy   . Anxiety   . Depression   . GERD (gastroesophageal reflux disease)   . Hyperlipidemia   . Hyperplastic colon polyp 10/2003  . Hypertension   . Menorrhagia   . Obesity   . PONV (postoperative nausea and vomiting)   . RLS (restless legs syndrome)     Past Surgical History:  Procedure Laterality Date  . COLONOSCOPY    . CYSTOSCOPY N/A 10/12/2016   Procedure: CYSTOSCOPY;  Surgeon: Anastasio Auerbach, MD;  Location: [facility];  Service: Gynecology;  Laterality: N/A;  . ENDOMETRIAL ABLATION  2007   [facility]  . FOOT SURGERY    . LAPAROSCOPIC VAGINAL HYSTERECTOMY WITH SALPINGECTOMY Bilateral 10/12/2016   Procedure: LAPAROSCOPIC ASSISTED VAGINAL HYSTERECTOMY WITH SALPINGECTOMY;  Surgeon: Anastasio Auerbach, MD;  Location: [facility];  Service: Gynecology;  Laterality: Bilateral;  request to follow first case at around 10:30am.  Requests 2 1/2 hours for the case.  Marland Kitchen SALIVARY GLAND SURGERY  12-11   LEFT SIDE NODULE REMOVED WAS BENIGN  . TUBAL LIGATION      There were no vitals filed for this visit.  Subjective Assessment - 10/16/19 1024    Subjective  arrived late today, c/o increased soreness and numbness and burning in both shoulders - worse on Rt.  doesn't feel DN  has been overly beneficial.    Patient Stated Goals  Stop hurting, Sleep better    Currently in Pain?  Yes    Pain Score  6     Pain Location  Neck    Pain Orientation  Left;Right    Pain Descriptors / Indicators  Aching;Sore    Pain Type  Chronic pain    Pain Radiating Towards  down arms at times    Pain Onset  More than a month ago    Pain Frequency  Intermittent    Aggravating Factors   sitting long periods, driving, turning head    Pain Relieving Factors  lying down                       [facility] Adult PT Treatment/Exercise - 10/16/19 1025      Neck Exercises: Machines for Strengthening   UBE (Upper Arm Bike)  L1 x 4 min (2' each direction - very slow)      Neck Exercises: Seated   Cervical Rotation  Right;Left;10 reps    Cervical Rotation Limitations  3-5 sec hold    Shoulder Rolls  Backwards;20 reps    Other Seated Exercise  scap retraction 10 x 5 sec hold; scap retraction with er 10 x 5 sec hold  Modalities   Modalities  Traction      Traction   Type of Traction  Cervical    Min (lbs)  13    Max (lbs)  18    Hold Time  60    Rest Time  20    Time  12      Neck Exercises: Stretches   Upper Trapezius Stretch  Right;Left;2 reps;20 seconds    Levator Stretch  Right;Left;2 reps;20 seconds                  PT Long Term Goals - 09/26/19 1103      PT LONG TERM GOAL #1   Title  Pt will be independent in her HEP and progression.    Time  6    Status  New      PT LONG TERM GOAL #2   Title  Pt will be able to improve bilateral shoulder flexion to >/= 160 degrees.    Baseline  see flow sheets    Time  6    Period  Weeks    Status  New      PT LONG TERM GOAL #3   Title  Pt will be able to increase her cervical rotation to >/= 40 degrees bilaterally to improve function and driving.    Baseline  see flow sheets    Time  6    Period  Weeks    Status  New      PT LONG TERM GOAL #4   Title  Pt will be able to report driving more than 30  minutes with pain </= 3/10.    Baseline  currently pain varies.    Time  6    Period  Weeks    Status  New            Plan - 10/16/19 1049    Clinical Impression Statement  Pt with minimal change in symptoms or motion since initiating PT.  Trial of traction today to see if this is beneficial.  Has injection scheduled for 4/13 so hopeful for some relief with this as well.  Will plan to reassess next visit and determine next steps.    Examination-Activity Limitations  Sit;Lift;Reach Overhead    Examination-Participation Restrictions  Other;Driving    Stability/Clinical Decision Making  Evolving/Moderate complexity    Rehab Potential  Good    PT Frequency  2x / week    PT Duration  6 weeks    PT Treatment/Interventions  ADLs/Self Care Home Management;Cryotherapy;Electrical Stimulation;Iontophoresis 4mg /ml Dexamethasone;Moist Heat;Traction;Ultrasound;Functional mobility training;Stair training;Gait training;Therapeutic activities;Therapeutic exercise;Balance training;Neuromuscular re-education;Patient/family education;Manual techniques;Taping;Passive range of motion;Dry needling    PT Next Visit Plan  reassess, assess response to traction, continue per POC    PT Home Exercise Plan  [id number]    Consulted and Agree with Plan of Care  Patient       Patient will benefit from skilled therapeutic intervention in order to improve the following deficits and impairments:  Pain, Postural dysfunction, Decreased activity tolerance, Impaired flexibility, Decreased strength, Decreased range of motion, Decreased mobility  Visit Diagnosis: Cervicalgia  Chronic left shoulder pain  Chronic right shoulder pain  Pain in thoracic spine  Muscle weakness (generalized)     Problem List Patient Active Problem List   Diagnosis Date Noted  . Neck pain on left side 08/15/2019  . B12 deficiency 06/02/2019  . Vertigo 04/20/2019  . Closed head injury 04/18/2019  . Dysphagia 11/13/2018  . Epigastric  pain 11/13/2018  .  Anemia 11/13/2018  . Asthma 10/10/2018  . Acute upper respiratory infection 09/18/2018  . Bad breath 05/25/2016  . Abnormal TSH 05/09/2015  . Other and unspecified ovarian cyst 05/30/2013  . Chronic constipation 04/17/2013  . Preventative health care 10/12/2010  . Hyperlipidemia 03/26/2007  . Anxiety state 03/26/2007  . Depression 03/26/2007  . Essential hypertension 03/26/2007  . Allergic rhinitis 03/26/2007  . GERD 03/26/2007  . IBS 03/26/2007  . COLONIC POLYPS, HX OF 03/26/2007      Laureen Abrahams, PT, DPT 10/16/19 10:51 AM    [facility] [address] [place], [place], [address] Phone: [phone]   Fax:  [phone]  Name: PHALYN WAS MRN: [id number] Date of Birth: [birth year]

## 2019-10-18 ENCOUNTER — Encounter: Payer: BC Managed Care – PPO | Admitting: Physical Therapy

## 2019-10-22 ENCOUNTER — Encounter: Payer: BC Managed Care – PPO | Admitting: Physical Therapy

## 2019-10-23 ENCOUNTER — Encounter: Payer: Self-pay | Admitting: Family Medicine
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
## 2019-10-30 ENCOUNTER — Encounter: Payer: Self-pay | Admitting: Physical Medicine and Rehabilitation

## 2019-10-30 ENCOUNTER — Ambulatory Visit (INDEPENDENT_AMBULATORY_CARE_PROVIDER_SITE_OTHER): Payer: BC Managed Care – PPO | Admitting: Physical Medicine and Rehabilitation

## 2019-10-30 ENCOUNTER — Ambulatory Visit: Payer: Self-pay

## 2019-10-30 ENCOUNTER — Other Ambulatory Visit: Payer: Self-pay

## 2019-10-30 ENCOUNTER — Encounter: Payer: BC Managed Care – PPO | Admitting: Physical Therapy

## 2019-10-30 VITALS — BP 148/96 | HR 86

## 2019-10-30 DIAGNOSIS — M5412 Radiculopathy, cervical region: Secondary | ICD-10-CM | POA: Diagnosis not present

## 2019-10-30 DIAGNOSIS — M47812 Spondylosis without myelopathy or radiculopathy, cervical region: Secondary | ICD-10-CM

## 2019-10-30 MED ORDER — METHYLPREDNISOLONE ACETATE 80 MG/ML IJ SUSP
40.0000 mg | Freq: Once | INTRAMUSCULAR | Status: AC
  Administered 2019-10-30: 40 mg

## 2019-10-30 NOTE — Progress Notes (Signed)
Pt states pain in the neck on the left side that radiates into the left shoulder. Pt states pain started November 2020. Tramadol and resting helps with pain. Driving and moving neck makes pain worse.   .Numeric Pain Rating Scale and Functional Assessment Average Pain 7   In the last MONTH (on 0-10 scale) has pain interfered with the following?  1. General activity like being  able to carry out your everyday physical activities such as walking, climbing stairs, carrying groceries, or moving a chair?  Rating(8)   +Driver, -BT, -Dye Allergies.

## 2019-10-31 NOTE — Progress Notes (Signed)
[name] - 54 y.o. female MRN [id number]  Date of birth: [birth year]  Office Visit Note: Visit Date: 10/30/2019 PCP: Biagio Borg, MD Referred by: Biagio Borg, MD  Subjective: Chief Complaint  Patient presents with  . Neck - Pain  . Left Shoulder - Pain   HPI:  [name] is a 54 y.o. female who comes in today For diagnostic and hopefully therapeutic cervical injection as requested by Dr. Eunice Blase.  Patient's been having this chronic worsening left-sided mostly axial neck pain some referral into the shoulder blade some referral into the lower almost anterior part of the cervical spine or upper chest.  MRI is reviewed below.  Further details of her case and potential injury can be reviewed with Dr. Junius Roads.  She does have asymmetric arthritic change at C2-3 C3-4 C4-5 on the left compared to right.  We are going to complete diagnostic facet joint blocks today.  This was reviewed with her and she does want to proceed.  Could consider epidural injection she does have some foraminal narrowing but no high-grade central stenosis or nerve compression or disc herniation.  ROS Otherwise per HPI.  Assessment & Plan: Visit Diagnoses:  1. Cervical spondylosis without myelopathy   2. Cervical radiculopathy     Plan: No additional findings.   Meds & Orders:  Meds ordered this encounter  Medications  . methylPREDNISolone acetate (DEPO-MEDROL) injection 40 mg    Orders Placed This Encounter  Procedures  . Facet Injection  . XR C-ARM NO REPORT  . Epidural Steroid injection    Follow-up: Return for visit to requesting physician as needed.   Procedures: No procedures performed  Diagnostic Cervical Facet Joint Nerve Block with Fluoroscopic Guidance  Patient: [name]      Date of Birth: [birth year] MRN: [id number] PCP: Biagio Borg, MD      Visit Date: 10/30/2019   Universal Protocol:    Date/Time: 04/14/215:51 AM  Consent Given By: the patient  Position:  LATERAL  Additional Comments: Vital signs were monitored before and after the procedure. Patient was prepped and draped in the usual sterile fashion. The correct patient, procedure, and site was verified.   Injection Procedure Details:  Procedure Site One Meds Administered:  Meds ordered this encounter  Medications  . methylPREDNISolone acetate (DEPO-MEDROL) injection 40 mg     Laterality: Left  Location/Site:  C2-3 C3-4 C4-5  Needle size: 25 G  Needle type: Standard  Needle Placement: Articular Pillar  Findings:  -Contrast Used: 0.5 mL iohexol 180 mg iodine/mL   -Comments: Excellent flow of contrast and biplanar imaging showing position of the articular pillars.  Procedure Details: The fluoroscope beam was manipulated to achieve the best "true" lateral view possible by squaring off the endplates with cranial and caudal tilt and using varying obliquity to achieve the a view with the longest length of spinous process.  The region overlying the facet joints mentioned above were then localized under fluoroscopic visualization.  The needle was inserted down to the center of the "trapezoid" outline of the facet joint lateral mass. Bi-planar images were used for confirming placement and spot radiographs were documented.  A 0.25 ml volume of Omnipaque-240 was injected to look for vascular uptake. A 0.5 ml. volume of the anesthetic solution was injected onto the target. This procedure was repeated for each medial branch nerve injected.  Prior to the procedure, the patient was given a Pain Diary which was completed for baseline measurements.  After the procedure, the patient rated their pain every 30 minutes and will continue rating at this frequency for a total of 5 hours.  The patient has been asked to complete the Diary and return to [place] by mail, fax or hand delivered as soon as possible.   Additional Comments:  The patient tolerated the procedure well Dressing: Band-Aid     Post-procedure details: Patient was observed during the procedure. Post-procedure instructions were reviewed.  Patient left the clinic in stable condition.      Clinical History: MRI CERVICAL SPINE WITHOUT CONTRAST  TECHNIQUE: Multiplanar, multisequence MR imaging of the cervical spine was performed. No intravenous contrast was administered.  COMPARISON:  None.  FINDINGS: Alignment: Physiologic.  Vertebrae: No fracture, evidence of discitis, or aggressive bone lesion. 5 mm T1 hypointense and T2 hyperintense bone lesion in the C7 vertebral body which may reflect a atypical hemangioma.  Cord: Normal signal and morphology.  Posterior Fossa, vertebral arteries, paraspinal tissues: Posterior fossa demonstrates no focal abnormality. Vertebral artery flow voids are maintained. Paraspinal soft tissues are unremarkable.  Disc levels:  Discs: Degenerative disease with disc height loss at C3-4, C4-5, C5-6.  C2-3: No significant disc bulge. Moderate left facet arthropathy. Mild left foraminal stenosis. No right foraminal stenosis. No central canal stenosis.  C3-4: Broad-based disc bulge. Moderate left facet arthropathy. Mild bilateral foraminal stenosis. No central canal stenosis.  C4-5: Mild broad-based disc bulge. Bilateral uncovertebral degenerative changes. Moderate bilateral foraminal stenosis. No central canal stenosis.  C5-6: Broad-based disc bulge. No neural foraminal stenosis. No central canal stenosis.  C6-7: No significant disc bulge. No neural foraminal stenosis. No central canal stenosis.  C7-T1: Broad-based disc bulge. No neural foraminal stenosis. No central canal stenosis.  IMPRESSION: 1. Mild cervical spine spondylosis as detailed above. 2.  No acute osseous injury of the cervical spine. 3. Indeterminate 5 mm T1 hypointense and T2 hyperintense bone lesion in the C7 vertebral body likely reflecting an atypical hemangioma versus less  likely an aggressive bone lesion. Recommend follow-up MRI of the cervical spine in 6 months.   Electronically Signed   By: Kathreen Devoid   On: 08/29/2019 10:46     Objective:  VS:  HT:    WT:   BMI:     BP:(!) 148/96  HR:86bpm  TEMP: ( )  RESP:  Physical Exam  Ortho Exam Imaging: XR C-ARM NO REPORT  Result Date: 10/30/2019 Please see Notes tab for imaging impression.

## 2019-10-31 NOTE — Procedures (Signed)
Diagnostic Cervical Facet Joint Nerve Block with Fluoroscopic Guidance  Patient: [name]      Date of Birth: 11/07/1965 MRN: [id number] PCP: Biagio Borg, MD      Visit Date: 10/30/2019   Universal Protocol:    Date/Time: 04/14/215:51 AM  Consent Given By: the patient  Position: LATERAL  Additional Comments: Vital signs were monitored before and after the procedure. Patient was prepped and draped in the usual sterile fashion. The correct patient, procedure, and site was verified.   Injection Procedure Details:  Procedure Site One Meds Administered:  Meds ordered this encounter  Medications  . methylPREDNISolone acetate (DEPO-MEDROL) injection 40 mg     Laterality: Left  Location/Site:  C2-3 C3-4 C4-5  Needle size: 25 G  Needle type: Standard  Needle Placement: Articular Pillar  Findings:  -Contrast Used: 0.5 mL iohexol 180 mg iodine/mL   -Comments: Excellent flow of contrast and biplanar imaging showing position of the articular pillars.  Procedure Details: The fluoroscope beam was manipulated to achieve the best "true" lateral view possible by squaring off the endplates with cranial and caudal tilt and using varying obliquity to achieve the a view with the longest length of spinous process.  The region overlying the facet joints mentioned above were then localized under fluoroscopic visualization.  The needle was inserted down to the center of the "trapezoid" outline of the facet joint lateral mass. Bi-planar images were used for confirming placement and spot radiographs were documented.  A 0.25 ml volume of Omnipaque-240 was injected to look for vascular uptake. A 0.5 ml. volume of the anesthetic solution was injected onto the target. This procedure was repeated for each medial branch nerve injected.  Prior to the procedure, the patient was given a Pain Diary which was completed for baseline measurements.  After the procedure, the patient rated their pain  every 30 minutes and will continue rating at this frequency for a total of 5 hours.  The patient has been asked to complete the Diary and return to [place] by mail, fax or hand delivered as soon as possible.   Additional Comments:  The patient tolerated the procedure well Dressing: Band-Aid    Post-procedure details: Patient was observed during the procedure. Post-procedure instructions were reviewed.  Patient left the clinic in stable condition.

## 2019-11-01 ENCOUNTER — Encounter: Payer: Self-pay | Admitting: Family Medicine

## 2019-11-02 ENCOUNTER — Encounter: Payer: Self-pay | Admitting: Family Medicine

## 2019-11-02 ENCOUNTER — Encounter: Payer: Self-pay | Admitting: Physical Medicine and Rehabilitation

## 2019-11-02 ENCOUNTER — Telehealth: Payer: Self-pay | Admitting: Physical Medicine and Rehabilitation

## 2019-11-02 NOTE — Telephone Encounter (Signed)
I called patient to advise per message below. She asked me to send a message to Dr. Junius Roads to see if he can extend her leave time. Please advise.

## 2019-11-02 NOTE — Telephone Encounter (Signed)
I have been communicating with the patient through [place] on this. A new out of work note through 11/17/19 has been written and faxed to the patient's employer.

## 2019-11-02 NOTE — Telephone Encounter (Signed)
Patient had cervical facet injections on 4/13. She reports that she is still having a lot of pain. She states that she is supposed to return to work on Monday. She is requesting a note from you keeping her out of work until 5/1 so the steroid medication has time to work. Please advise.

## 2019-11-02 NOTE — Telephone Encounter (Signed)
Work note ok for Monday then if no better needs to see Dr. Junius Roads for further management - obviously we can help if needed

## 2019-11-04 ENCOUNTER — Other Ambulatory Visit: Payer: Self-pay | Admitting: Women's Health

## 2019-11-05 ENCOUNTER — Telehealth: Payer: Self-pay | Admitting: Family Medicine

## 2019-11-05 NOTE — Telephone Encounter (Signed)
Records re faxed [employer] [phone] faxed 4/6)

## 2019-11-05 NOTE — Telephone Encounter (Signed)
Okay for refill but if no relief office visit.  Review it is sometimes difficult to know if it is yeast or bacteria.

## 2019-11-06 ENCOUNTER — Other Ambulatory Visit: Payer: Self-pay

## 2019-11-06 ENCOUNTER — Ambulatory Visit: Payer: BC Managed Care – PPO | Admitting: Physical Therapy

## 2019-11-06 ENCOUNTER — Encounter: Payer: Self-pay | Admitting: Physical Therapy

## 2019-11-06 DIAGNOSIS — M546 Pain in thoracic spine: Secondary | ICD-10-CM

## 2019-11-06 DIAGNOSIS — M6281 Muscle weakness (generalized): Secondary | ICD-10-CM

## 2019-11-06 DIAGNOSIS — M25512 Pain in left shoulder: Secondary | ICD-10-CM

## 2019-11-06 DIAGNOSIS — M542 Cervicalgia: Secondary | ICD-10-CM | POA: Diagnosis not present

## 2019-11-06 DIAGNOSIS — G8929 Other chronic pain: Secondary | ICD-10-CM

## 2019-11-06 DIAGNOSIS — M25511 Pain in right shoulder: Secondary | ICD-10-CM | POA: Diagnosis not present

## 2019-11-06 NOTE — Therapy (Signed)
[facility] [address] [place], [place], [address] Phone: [phone]   Fax:  [phone]  Physical Therapy Treatment  Patient Details  Name: [name] MRN: [id number] Date of Birth: [birth year] Referring Provider (PT): Eunice Blase, MD   Encounter Date: 11/06/2019  PT End of Session - 11/06/19 1434    Visit Number  5    Number of Visits  12    Date for PT Re-Evaluation  11/21/19   extended due to missed weeks   PT Start Time  1315    PT Stop Time  1400    PT Time Calculation (min)  45 min    Activity Tolerance  Patient tolerated treatment well    Behavior During Therapy  [facility] for tasks assessed/performed       Past Medical History:  Diagnosis Date  . Allergic rhinitis   . Allergy   . Anxiety   . Depression   . GERD (gastroesophageal reflux disease)   . Hyperlipidemia   . Hyperplastic colon polyp 10/2003  . Hypertension   . Menorrhagia   . Obesity   . PONV (postoperative nausea and vomiting)   . RLS (restless legs syndrome)     Past Surgical History:  Procedure Laterality Date  . COLONOSCOPY    . CYSTOSCOPY N/A 10/12/2016   Procedure: CYSTOSCOPY;  Surgeon: Anastasio Auerbach, MD;  Location: [facility];  Service: Gynecology;  Laterality: N/A;  . ENDOMETRIAL ABLATION  2007   Dr. Landry Mellow at [facility]  . FOOT SURGERY    . LAPAROSCOPIC VAGINAL HYSTERECTOMY WITH SALPINGECTOMY Bilateral 10/12/2016   Procedure: LAPAROSCOPIC ASSISTED VAGINAL HYSTERECTOMY WITH SALPINGECTOMY;  Surgeon: Anastasio Auerbach, MD;  Location: [facility];  Service: Gynecology;  Laterality: Bilateral;  request to follow first case at around 10:30am.  Requests 2 1/2 hours for the case.  Marland Kitchen SALIVARY GLAND SURGERY  12-11   LEFT SIDE NODULE REMOVED WAS BENIGN  . TUBAL LIGATION      There were no vitals filed for this visit.  Subjective Assessment - 11/06/19 1317    Subjective  had injection last week - still having pain but feels more isolated to location of pain  without radiating.  looking down increases pain    Patient Stated Goals  Stop hurting, Sleep better    Currently in Pain?  Yes    Pain Score  4     Pain Location  Neck    Pain Orientation  Left;Right    Pain Descriptors / Indicators  Aching;Sore    Pain Type  Chronic pain    Pain Radiating Towards  feels pain radiates into RUE now    Pain Onset  More than a month ago    Pain Frequency  Intermittent    Aggravating Factors   looking down for long periods (5-10 min)    Pain Relieving Factors  lying down         [facility] PT Assessment - 11/06/19 1356      Assessment   Medical Diagnosis  M54.2 neck pain, thoracic pain    Referring Provider (PT)  Eunice Blase, MD      AROM   Cervical - Right Rotation  58    Cervical - Left Rotation  54                   [facility] Adult PT Treatment/Exercise - 11/06/19 1321      Neck Exercises: Machines for Strengthening   UBE (Upper Arm Bike)  L1  x 4 min (forwards - very slow)      Neck Exercises: Theraband   Rows  10 reps   L1; seated   Horizontal ABduction  10 reps   L1; seated     Neck Exercises: Seated   Cervical Rotation  Right;Left;10 reps    Cervical Rotation Limitations  3-5 sec hold    Shoulder Rolls  Backwards;20 reps    Other Seated Exercise  scap retraction 10 x 5 sec hold; scap retraction with er 10 x 5 sec hold      Manual Therapy   Manual Therapy  Soft tissue mobilization    Manual therapy comments  skilled palpation and monitoring of soft tissue during DN    Soft tissue mobilization  bil cervical paraspinals; bil upper trap and levator scapula; suboccipital release       Trigger Point Dry Needling - 11/06/19 1352    Consent Given?  Yes    Education Handout Provided  Previously provided    Muscles Treated Head and Neck  Cervical multifidi;Splenius capitus;Semispinalis capitus;Oblique capitus    Oblique Capitus Response  Twitch response elicited;Palpable increased muscle length    Splenius capitus Response   Twitch reponse elicited;Palpable increased muscle length    Semispinalis capitus Response  Twitch reponse elicited;Palpable increased muscle length    Cervical multifidi Response  Twitch reponse elicited           PT Education - 11/06/19 1435    Education Details  HEP, benefits to staying active, use of tennis ball for self STM    Person(s) Educated  Patient    Methods  Explanation;Demonstration;Handout    Comprehension  Verbalized understanding;Returned demonstration;Need further instruction          PT Long Term Goals - 11/06/19 1436      PT LONG TERM GOAL #1   Title  Pt will be independent in her HEP and progression.    Time  6    Status  On-going    Target Date  11/21/19      PT LONG TERM GOAL #2   Title  Pt will be able to improve bilateral shoulder flexion to >/= 160 degrees.    Baseline  see flow sheets    Time  6    Period  Weeks    Status  On-going    Target Date  11/21/19      PT LONG TERM GOAL #3   Title  Pt will be able to increase her cervical rotation to >/= 40 degrees bilaterally to improve function and driving.    Baseline  see flow sheets    Time  6    Period  Weeks    Status  Achieved      PT LONG TERM GOAL #4   Title  Pt will be able to report driving more than 30 minutes with pain </= 3/10.    Baseline  currently pain varies.    Time  6    Period  Weeks    Status  On-going    Target Date  11/21/19            Plan - 11/06/19 1436    Clinical Impression Statement  Pt has met 1LTGs an extended remaining goals to reflect missed weeks of PT.  Pt with some mild improvements following ESI last week, and still very guarded with her posture and movement.  Will continue to benefit from PT to maximize function.    Examination-Activity Limitations  Sit;Lift;Reach  Overhead    Examination-Participation Restrictions  Other;Driving    Stability/Clinical Decision Making  Evolving/Moderate complexity    Rehab Potential  Good    PT Frequency  2x /  week    PT Duration  6 weeks    PT Treatment/Interventions  ADLs/Self Care Home Management;Cryotherapy;Electrical Stimulation;Iontophoresis 64m/ml Dexamethasone;Moist Heat;Traction;Ultrasound;Functional mobility training;Stair training;Gait training;Therapeutic activities;Therapeutic exercise;Balance training;Neuromuscular re-education;Patient/family education;Manual techniques;Taping;Passive range of motion;Dry needling    PT Next Visit Plan  add strengthening into program, graded tolerance to activity    PT Home Exercise Plan  [id number]   Consulted and Agree with Plan of Care  Patient       Patient will benefit from skilled therapeutic intervention in order to improve the following deficits and impairments:  Pain, Postural dysfunction, Decreased activity tolerance, Impaired flexibility, Decreased strength, Decreased range of motion, Decreased mobility  Visit Diagnosis: Cervicalgia  Chronic left shoulder pain  Chronic right shoulder pain  Pain in thoracic spine  Muscle weakness (generalized)     Problem List Patient Active Problem List   Diagnosis Date Noted  . Neck pain on left side 08/15/2019  . B12 deficiency 06/02/2019  . Vertigo 04/20/2019  . Closed head injury 04/18/2019  . Dysphagia 11/13/2018  . Epigastric pain 11/13/2018  . Anemia 11/13/2018  . Asthma 10/10/2018  . Acute upper respiratory infection 09/18/2018  . Bad breath 05/25/2016  . Abnormal TSH 05/09/2015  . Other and unspecified ovarian cyst 05/30/2013  . Chronic constipation 04/17/2013  . Preventative health care 10/12/2010  . Hyperlipidemia 03/26/2007  . Anxiety state 03/26/2007  . Depression 03/26/2007  . Essential hypertension 03/26/2007  . Allergic rhinitis 03/26/2007  . GERD 03/26/2007  . IBS 03/26/2007  . COLONIC POLYPS, HX OF 03/26/2007      SLaureen Abrahams PT, DPT 11/06/19 2:38 PM    [facility] [address]  [address]Phone: [phone]  Fax:  [phone] Name: Tara NUSZMRN: [id number] of Birth: 102-Mar-1967

## 2019-11-06 NOTE — Patient Instructions (Signed)
Access Code: [email or web address] URL: [email or web address] Date: 11/06/2019 Prepared by: Faustino Congress  Exercises Seated Cervical Retraction - 3 x daily - 7 x weekly - 10 reps - 5 seconds hold Standing Lower Cervical and Upper Thoracic Stretch - 3 x daily - 7 x weekly - 5 reps - 3 sets - 10 seconds hold Seated Cervical Rotation AROM - 3 x daily - 7 x weekly - 5 reps - 10 seconds hold Seated Scapular Retraction - 2 x daily - 7 x weekly - 10 reps - 1 sets - 5 sec hold Standing Backward Shoulder Rolls - 2 x daily - 7 x weekly - 10 reps - 1 sets

## 2019-11-08 ENCOUNTER — Other Ambulatory Visit: Payer: Self-pay | Admitting: Internal Medicine

## 2019-11-08 DIAGNOSIS — Z1231 Encounter for screening mammogram for malignant neoplasm of breast: Secondary | ICD-10-CM

## 2019-11-13 ENCOUNTER — Encounter: Payer: BC Managed Care – PPO | Admitting: Physical Therapy

## 2019-11-13 ENCOUNTER — Telehealth: Payer: Self-pay | Admitting: Physical Therapy

## 2019-11-13 NOTE — Telephone Encounter (Signed)
LVM for pt as she NS for appt today.  Requested call back, today was last scheduled appt. Laureen Abrahams, PT, DPT 11/13/19 1:59 PM

## 2019-11-16 ENCOUNTER — Encounter: Payer: Self-pay | Admitting: Family Medicine

## 2019-11-21 ENCOUNTER — Encounter: Payer: Self-pay | Admitting: Family Medicine

## 2019-11-21 ENCOUNTER — Encounter: Payer: Self-pay | Admitting: Physical Therapy

## 2019-11-28 ENCOUNTER — Other Ambulatory Visit: Payer: Self-pay | Admitting: Family Medicine

## 2019-11-29 ENCOUNTER — Ambulatory Visit: Payer: BC Managed Care – PPO | Admitting: Internal Medicine

## 2019-11-30 ENCOUNTER — Encounter: Payer: Self-pay | Admitting: Family Medicine

## 2019-12-05 ENCOUNTER — Encounter: Payer: Self-pay | Admitting: Family Medicine

## 2019-12-05 ENCOUNTER — Encounter: Payer: Self-pay | Admitting: Internal Medicine

## 2019-12-05 NOTE — Telephone Encounter (Signed)
Sure sounds ok to me - Verdis Frederickson can you do this? thanks

## 2019-12-05 NOTE — Telephone Encounter (Signed)
Ortho completed paperwork saying patient is out of work. But her job is requesting a letter saying she saw Dr.John on 08/15/19 and he referred her to Orthopedic surgery, Which her appointment was with them on 08/28/19.  Would like letter uploaded to mychart.

## 2019-12-07 ENCOUNTER — Ambulatory Visit (INDEPENDENT_AMBULATORY_CARE_PROVIDER_SITE_OTHER): Payer: BC Managed Care – PPO | Admitting: Internal Medicine

## 2019-12-07 ENCOUNTER — Other Ambulatory Visit: Payer: Self-pay

## 2019-12-07 ENCOUNTER — Encounter: Payer: Self-pay | Admitting: Internal Medicine

## 2019-12-07 VITALS — BP 130/90 | HR 87 | Temp 98.9°F | Ht 62.0 in | Wt 165.0 lb

## 2019-12-07 DIAGNOSIS — E785 Hyperlipidemia, unspecified: Secondary | ICD-10-CM

## 2019-12-07 DIAGNOSIS — E559 Vitamin D deficiency, unspecified: Secondary | ICD-10-CM | POA: Diagnosis not present

## 2019-12-07 DIAGNOSIS — Z0000 Encounter for general adult medical examination without abnormal findings: Secondary | ICD-10-CM | POA: Diagnosis not present

## 2019-12-07 DIAGNOSIS — E538 Deficiency of other specified B group vitamins: Secondary | ICD-10-CM | POA: Diagnosis not present

## 2019-12-07 DIAGNOSIS — J309 Allergic rhinitis, unspecified: Secondary | ICD-10-CM

## 2019-12-07 DIAGNOSIS — I10 Essential (primary) hypertension: Secondary | ICD-10-CM | POA: Diagnosis not present

## 2019-12-07 DIAGNOSIS — F329 Major depressive disorder, single episode, unspecified: Secondary | ICD-10-CM

## 2019-12-07 DIAGNOSIS — M542 Cervicalgia: Secondary | ICD-10-CM

## 2019-12-07 DIAGNOSIS — Z0001 Encounter for general adult medical examination with abnormal findings: Secondary | ICD-10-CM | POA: Diagnosis not present

## 2019-12-07 DIAGNOSIS — R937 Abnormal findings on diagnostic imaging of other parts of musculoskeletal system: Secondary | ICD-10-CM | POA: Diagnosis not present

## 2019-12-07 DIAGNOSIS — F32A Depression, unspecified: Secondary | ICD-10-CM

## 2019-12-07 LAB — HEPATIC FUNCTION PANEL
ALT: 7 U/L (ref 0–35)
AST: 14 U/L (ref 0–37)
Albumin: 4.7 g/dL (ref 3.5–5.2)
Alkaline Phosphatase: 57 U/L (ref 39–117)
Bilirubin, Direct: 0.1 mg/dL (ref 0.0–0.3)
Total Bilirubin: 0.5 mg/dL (ref 0.2–1.2)
Total Protein: 8 g/dL (ref 6.0–8.3)

## 2019-12-07 LAB — CBC WITH DIFFERENTIAL/PLATELET
Basophils Absolute: 0 10*3/uL (ref 0.0–0.1)
Basophils Relative: 0.2 % (ref 0.0–3.0)
Eosinophils Absolute: 0 10*3/uL (ref 0.0–0.7)
Eosinophils Relative: 0.1 % (ref 0.0–5.0)
HCT: 37.6 % (ref 36.0–46.0)
Hemoglobin: 12.6 g/dL (ref 12.0–15.0)
Lymphocytes Relative: 28.9 % (ref 12.0–46.0)
Lymphs Abs: 2.5 10*3/uL (ref 0.7–4.0)
MCHC: 33.5 g/dL (ref 30.0–36.0)
MCV: 90.9 fl (ref 78.0–100.0)
Monocytes Absolute: 0.7 10*3/uL (ref 0.1–1.0)
Monocytes Relative: 8.1 % (ref 3.0–12.0)
Neutro Abs: 5.4 10*3/uL (ref 1.4–7.7)
Neutrophils Relative %: 62.7 % (ref 43.0–77.0)
Platelets: 246 10*3/uL (ref 150.0–400.0)
RBC: 4.13 Mil/uL (ref 3.87–5.11)
RDW: 15.1 % (ref 11.5–15.5)
WBC: 8.7 10*3/uL (ref 4.0–10.5)

## 2019-12-07 LAB — URINALYSIS, ROUTINE W REFLEX MICROSCOPIC
Bilirubin Urine: NEGATIVE
Hgb urine dipstick: NEGATIVE
Ketones, ur: NEGATIVE
Leukocytes,Ua: NEGATIVE
Nitrite: NEGATIVE
Specific Gravity, Urine: 1.025 (ref 1.000–1.030)
Total Protein, Urine: NEGATIVE
Urine Glucose: NEGATIVE
Urobilinogen, UA: 0.2 (ref 0.0–1.0)
pH: 6 (ref 5.0–8.0)

## 2019-12-07 LAB — BASIC METABOLIC PANEL
BUN: 11 mg/dL (ref 6–23)
CO2: 30 mEq/L (ref 19–32)
Calcium: 9.9 mg/dL (ref 8.4–10.5)
Chloride: 103 mEq/L (ref 96–112)
Creatinine, Ser: 0.84 mg/dL (ref 0.40–1.20)
GFR: 86.34 mL/min (ref >60.00)
Glucose, Bld: 98 mg/dL (ref 70–99)
Potassium: 3.7 mEq/L (ref 3.5–5.1)
Sodium: 137 mEq/L (ref 135–145)

## 2019-12-07 LAB — LIPID PANEL
Cholesterol: 238 mg/dL — ABNORMAL HIGH (ref 0–200)
HDL: 49.9 mg/dL (ref >39.00)
LDL Cholesterol: 170 mg/dL — ABNORMAL HIGH (ref 0–99)
NonHDL: 188.3
Total CHOL/HDL Ratio: 5
Triglycerides: 94 mg/dL (ref 0.0–149.0)
VLDL: 18.8 mg/dL (ref 0.0–40.0)

## 2019-12-07 LAB — VITAMIN B12: Vitamin B-12: >1500 pg/mL — ABNORMAL HIGH (ref 211–911)

## 2019-12-07 LAB — VITAMIN D 25 HYDROXY (VIT D DEFICIENCY, FRACTURES): VITD: 41.38 ng/mL (ref 30.00–100.00)

## 2019-12-07 LAB — TSH: TSH: 0.86 u[IU]/mL (ref 0.35–4.50)

## 2019-12-07 MED ORDER — SERTRALINE HCL 100 MG PO TABS: 150.0000 mg | ORAL_TABLET | Freq: Every day | ORAL | 3 refills | Status: DC

## 2019-12-07 MED ORDER — FEXOFENADINE HCL 180 MG PO TABS: 180.0000 mg | ORAL_TABLET | Freq: Every day | ORAL | 2 refills | Status: DC

## 2019-12-07 MED ORDER — TRIAMCINOLONE ACETONIDE 55 MCG/ACT NA AERO: 2.0000 | INHALATION_SPRAY | Freq: Every day | NASAL | 12 refills | Status: DC

## 2019-12-07 MED ORDER — ROSUVASTATIN CALCIUM 10 MG PO TABS: 10.0000 mg | ORAL_TABLET | Freq: Every day | ORAL | 3 refills | Status: DC

## 2019-12-07 NOTE — Patient Instructions (Signed)
Please take all new medication as prescribed - the crestor 10 mg, allegra and nasacort  Ok to increase the zoloft to 150 mg per day  Please continue all other medications as before, and refills have been done if requested.  Please have the pharmacy call with any other refills you may need.  Please continue your efforts at being more active, low cholesterol diet, and weight control.  You are otherwise up to date with prevention measures today.  Please keep your appointments with your specialists as you may have planned - Dr Junius Roads  You will be contacted regarding the referral for: MRI neck  Please go to the LAB at the blood drawing area for the tests to be done  You will be contacted by phone if any changes need to be made immediately.  Otherwise, you will receive a letter about your results with an explanation, but please check with MyChart first.  Please remember to sign up for MyChart if you have not done so, as this will be important to you in the future with finding out test results, communicating by private email, and scheduling acute appointments online when needed.  Please make an Appointment to return in 6 months, or sooner if needed

## 2019-12-07 NOTE — Progress Notes (Signed)
Subjective:    Patient ID: [name], female    DOB: October 14, 1965, 54 y.o.   MRN: [id number]  HPI  Here for wellness and f/u;  Overall doing ok;  Pt denies Chest pain, worsening SOB, DOE, wheezing, orthopnea, PND, worsening LE edema, palpitations, dizziness or syncope.  Pt denies neurological change such as new headache, facial or extremity weakness.  Pt denies polydipsia, polyuria, or low sugar symptoms. Pt states overall good compliance with treatment and medications, good tolerability, and has been trying to follow appropriate diet.  Pt has had mild worsening depressive symptoms, but no suicidal ideation or panic. No fever, night sweats, wt loss, loss of appetite, or other constitutional symptoms.  Pt states good ability with ADL's, has low fall risk, home safety reviewed and adequate, no other significant changes in hearing or vision, and only occasionally active with exercise. Also has worsening post neck pain, mod to occas severe, constant with new radiation to the right upper back and shoulder but without distal RUE pain, numb, weakness.  Pt denies bowel or bladder change, fever, wt loss,  worsening LE pain/numbness/weakness, gait change or falls. Has hx of MRI with ? Lesion C7 with planned f/u for MRI repeat aug 2021 per ortho.  Also, Does have several wks ongoing nasal allergy symptoms with clearish congestion, itch and sneezing, without fever, pain, ST, cough, swelling or wheezing. Past Medical History:  Diagnosis Date  . Allergic rhinitis   . Allergy   . Anxiety   . Depression   . GERD (gastroesophageal reflux disease)   . Hyperlipidemia   . Hyperplastic colon polyp 10/2003  . Hypertension   . Menorrhagia   . Obesity   . PONV (postoperative nausea and vomiting)   . RLS (restless legs syndrome)    Past Surgical History:  Procedure Laterality Date  . COLONOSCOPY    . CYSTOSCOPY N/A 10/12/2016   Procedure: CYSTOSCOPY;  Surgeon: Anastasio Auerbach, MD;  Location: [facility];  Service:  Gynecology;  Laterality: N/A;  . ENDOMETRIAL ABLATION  2007   Dr. Landry Mellow at [facility]  . FOOT SURGERY    . LAPAROSCOPIC VAGINAL HYSTERECTOMY WITH SALPINGECTOMY Bilateral 10/12/2016   Procedure: LAPAROSCOPIC ASSISTED VAGINAL HYSTERECTOMY WITH SALPINGECTOMY;  Surgeon: Anastasio Auerbach, MD;  Location: [facility];  Service: Gynecology;  Laterality: Bilateral;  request to follow first case at around 10:30am.  Requests 2 1/2 hours for the case.  Marland Kitchen SALIVARY GLAND SURGERY  12-11   LEFT SIDE NODULE REMOVED WAS BENIGN  . TUBAL LIGATION      reports that she has never smoked. She has never used smokeless tobacco. She reports previous alcohol use. She reports that she does not use drugs. family history includes Cancer in her mother; Colon cancer in her mother; Colon polyps in her father; Heart attack in her maternal grandfather; Hyperlipidemia in an other family member; Hypertension in an other family member. Allergies  Allergen Reactions  . Other     POLLEN AND DUST  . Sulfur Hives   Current Outpatient Medications on File Prior to Visit  Medication Sig Dispense Refill  . amLODipine (NORVASC) 5 MG tablet Take 1 tablet (5 mg total) by mouth daily. 90 tablet 3  . clonazePAM (KLONOPIN) 0.5 MG tablet Take 1 tablet (0.5 mg total) by mouth 2 (two) times daily as needed for anxiety. 60 tablet 2  . Coenzyme Q10 (COQ10 PO) Take by mouth.    . famotidine (PEPCID) 20 MG tablet Take 1 tablet (20 mg  total) by mouth 2 (two) times daily. 180 tablet 3  . fluconazole (DIFLUCAN) 150 MG tablet TAKE ONE TABLET BY MOUTH AS A ONE-TIME DOSE 1 tablet 0  . Fluocinolone Acetonide Body 0.01 % OIL Apply 1 application topically 2 (two) times daily as needed (flaking of scalp).   11  . Fluticasone-Salmeterol (ADVAIR DISKUS) 250-50 MCG/DOSE AEPB Inhale 1 puff into the lungs 2 (two) times daily. 1 each 11  . hydrochlorothiazide (MICROZIDE) 12.5 MG capsule Take 1 capsule (12.5 mg total) by mouth daily. 90 capsule 3  . ketoconazole  (NIZORAL) 2 % shampoo Apply 1 application topically once a week.     . linaclotide (LINZESS) 145 MCG CAPS capsule Take 1 capsule (145 mcg total) by mouth daily. 30 capsule 5  . losartan (COZAAR) 100 MG tablet Take 1 tablet (100 mg total) by mouth daily. 90 tablet 3  . meclizine (ANTIVERT) 12.5 MG tablet Take 1 tablet (12.5 mg total) by mouth 3 (three) times daily as needed for dizziness. 30 tablet 1  . pantoprazole (PROTONIX) 40 MG tablet Take 1 tablet (40 mg total) by mouth 2 (two) times daily. 180 tablet 3  . potassium chloride (KLOR-CON) 10 MEQ tablet Take 2 tablets (20 mEq total) by mouth daily. 180 tablet 3  . tiZANidine (ZANAFLEX) 2 MG tablet TAKE 1 TO 2 TABLETS BY MOUTH EVERY 6 HOURS AS NEEDED FOR MUSCLE SPASM 60 tablet 0  . traMADol (ULTRAM) 50 MG tablet TAKE 1 TABLET BY MOUTH EVERY 6 HOURS AS NEEDED 20 tablet 0  . Turmeric (QC TUMERIC COMPLEX PO) Take by mouth.    . [DISCONTINUED] fluticasone (FLONASE) 50 MCG/ACT nasal spray 2 sprays by Nasal route daily. 16 g 2   No current facility-administered medications on file prior to visit.   Review of Systems All otherwise neg per pt     Objective:   Physical Exam BP 130/90 (BP Location: Left Arm, Patient Position: Sitting, Cuff Size: Large)   Pulse 87   Temp 98.9 F (37.2 C) (Oral)   Ht 5\' 2"  (1.575 m)   Wt 165 lb (74.8 kg)   LMP 10/01/2016   SpO2 98%   BMI 30.18 kg/m  VS noted,  Constitutional: Pt appears in NAD HENT: Head: NCAT.  Right Ear: External ear normal.  Left Ear: External ear normal.  Eyes: . Pupils are equal, round, and reactive to light. Conjunctivae and EOM are normal Bilat tm's with mild erythema.  Max sinus areas URI tender.  Pharynx with mild erythema, no exudate Nose: without d/c or deformity Neck: Neck supple. Gross normal ROM Cardiovascular: Normal rate and regular rhythm.   Pulmonary/Chest: Effort normal and breath sounds without rales or wheezing.  Right upper back with mild diffuse trapezoid area  tender Abd:  Soft, NT, ND, + BS, no organomegaly Neurological: Pt is alert. At baseline orientation, motor grossly intact Skin: Skin is warm. No rashes, other new lesions, no LE edema Psychiatric: Pt behavior is normal without agitation  All otherwise neg per pt Lab Results  Component Value Date   WBC 8.7 12/07/2019   HGB 12.6 12/07/2019   HCT 37.6 12/07/2019   PLT 246.0 12/07/2019   GLUCOSE 98 12/07/2019   CHOL 238 (H) 12/07/2019   TRIG 94.0 12/07/2019   HDL 49.90 12/07/2019   LDLDIRECT 156.0 04/17/2013   LDLCALC 170 (H) 12/07/2019   ALT 7 12/07/2019   AST 14 12/07/2019   NA 137 12/07/2019   K 3.7 12/07/2019   CL 103 12/07/2019  CREATININE 0.84 12/07/2019   BUN 11 12/07/2019   CO2 30 12/07/2019   TSH 0.86 12/07/2019      Assessment & Plan:

## 2019-12-08 ENCOUNTER — Encounter: Payer: Self-pay | Admitting: Internal Medicine

## 2019-12-08 DIAGNOSIS — R937 Abnormal findings on diagnostic imaging of other parts of musculoskeletal system: Secondary | ICD-10-CM | POA: Insufficient documentation

## 2019-12-08 DIAGNOSIS — M542 Cervicalgia: Secondary | ICD-10-CM | POA: Insufficient documentation

## 2019-12-08 NOTE — Assessment & Plan Note (Signed)
Uncontrolled, for start crestor 10 qd

## 2019-12-08 NOTE — Assessment & Plan Note (Signed)
Cont current pain control

## 2019-12-08 NOTE — Assessment & Plan Note (Signed)
stable overall by history and exam, recent data reviewed with pt, and pt to continue medical treatment as before,  to f/u any worsening symptoms or concerns  

## 2019-12-08 NOTE — Assessment & Plan Note (Signed)
Mild worsening, for increased zoloft 150 qd

## 2019-12-08 NOTE — Assessment & Plan Note (Signed)

## 2019-12-08 NOTE — Assessment & Plan Note (Signed)
Mild to mod, for allegra and nasacort asd,   to f/u any worsening symptoms or concerns 

## 2019-12-08 NOTE — Assessment & Plan Note (Addendum)
With worsening pain and ? Significant ?c7 lesion per feb 2021 MRI - for f/u now instead of aug 2021, may need further f/u with ortho  I spent 45 minutes in addition to time for CPX wellness examination in preparing to see the patient by review of recent labs, imaging and procedures, obtaining and reviewing separately obtained history, communicating with the patient and family or caregiver, ordering medications, tests or procedures, and documenting clinical information in the EHR including the differential Dx, treatment, and any further evaluation and other management of abnormal MRI, neck pain, allergies, depression, htn, hld, b12 deficiency

## 2019-12-08 NOTE — Assessment & Plan Note (Signed)
Cont replacememt

## 2019-12-11 ENCOUNTER — Encounter: Payer: Self-pay | Admitting: Physical Therapy

## 2019-12-11 ENCOUNTER — Ambulatory Visit (INDEPENDENT_AMBULATORY_CARE_PROVIDER_SITE_OTHER): Payer: BC Managed Care – PPO | Admitting: Physical Therapy

## 2019-12-11 ENCOUNTER — Other Ambulatory Visit: Payer: Self-pay

## 2019-12-11 DIAGNOSIS — M6281 Muscle weakness (generalized): Secondary | ICD-10-CM

## 2019-12-11 DIAGNOSIS — M25511 Pain in right shoulder: Secondary | ICD-10-CM | POA: Diagnosis not present

## 2019-12-11 DIAGNOSIS — M25512 Pain in left shoulder: Secondary | ICD-10-CM | POA: Diagnosis not present

## 2019-12-11 DIAGNOSIS — M546 Pain in thoracic spine: Secondary | ICD-10-CM

## 2019-12-11 DIAGNOSIS — M542 Cervicalgia: Secondary | ICD-10-CM

## 2019-12-11 DIAGNOSIS — G8929 Other chronic pain: Secondary | ICD-10-CM

## 2019-12-11 NOTE — Therapy (Signed)
[facility] Physical Therapy [address] [place], [place], [address] Phone: [phone]   Fax:  [phone]  Physical Therapy Treatment/Recert  Patient Details  Name: [name] MRN: [id number] Date of Birth: April 20, 1966 Referring Provider (PT): Eunice Blase, MD   Encounter Date: 12/11/2019  PT End of Session - 12/11/19 1337    Visit Number  6    Number of Visits  12    Date for PT Re-Evaluation  01/08/20   extended due to missed weeks   PT Start Time  1301    PT Stop Time  1345    PT Time Calculation (min)  44 min    Activity Tolerance  Patient tolerated treatment well    Behavior During Therapy  [facility] for tasks assessed/performed       Past Medical History:  Diagnosis Date  . Allergic rhinitis   . Allergy   . Anxiety   . Depression   . GERD (gastroesophageal reflux disease)   . Hyperlipidemia   . Hyperplastic colon polyp 10/2003  . Hypertension   . Menorrhagia   . Obesity   . PONV (postoperative nausea and vomiting)   . RLS (restless legs syndrome)     Past Surgical History:  Procedure Laterality Date  . COLONOSCOPY    . CYSTOSCOPY N/A 10/12/2016   Procedure: CYSTOSCOPY;  Surgeon: Anastasio Auerbach, MD;  Location: [facility];  Service: Gynecology;  Laterality: N/A;  . ENDOMETRIAL ABLATION  2007   Dr. Landry Mellow at [facility]  . FOOT SURGERY    . LAPAROSCOPIC VAGINAL HYSTERECTOMY WITH SALPINGECTOMY Bilateral 10/12/2016   Procedure: LAPAROSCOPIC ASSISTED VAGINAL HYSTERECTOMY WITH SALPINGECTOMY;  Surgeon: Anastasio Auerbach, MD;  Location: [facility];  Service: Gynecology;  Laterality: Bilateral;  request to follow first case at around 10:30am.  Requests 2 1/2 hours for the case.  Marland Kitchen SALIVARY GLAND SURGERY  12-11   LEFT SIDE NODULE REMOVED WAS BENIGN  . TUBAL LIGATION      There were no vitals filed for this visit.  Subjective Assessment - 12/11/19 1313    Subjective  saw another PT at different company, who recommended she continue her PT at  [facility]. She has not saw another MD for a second opinion yet. She states overall the pain is a little better but Rt shoulder pain is bothering her more with reaching and using her Rt arm, complains of N/T in her Rt thumb and index finger.    Patient Stated Goals  Stop hurting, Sleep better    Pain Onset  More than a month ago         [facility] PT Assessment - 12/11/19 0001      Assessment   Medical Diagnosis  M54.2 neck pain, thoracic pain    Referring Provider (PT)  Eunice Blase, MD      AROM   Cervical Flexion  20    Cervical Extension  30    Cervical - Right Side Bend  30    Cervical - Left Side Bend  25    Cervical - Right Rotation  60    Cervical - Left Rotation  55                    [facility] Adult PT Treatment/Exercise - 12/11/19 0001      Neck Exercises: Machines for Strengthening   UBE (Upper Arm Bike)  L1 x 5 minutes (3 min fwd, 2 min retro then stopped due to pain)  Neck Exercises: Theraband   Rows  20 reps;Green    Rows Limitations  standing      Neck Exercises: Seated   Cervical Rotation Limitations  AAROM with towel X 15 reps bilat    Other Seated Exercise  seated thoracic extension over towel roll X 15 reps    Other Seated Exercise  Pulleys 2 min flexion, 2 min scaption      Traction   Type of Traction  Cervical    Min (lbs)  13    Max (lbs)  18    Hold Time  60    Rest Time  20    Time  15                  PT Long Term Goals - 12/11/19 1459      PT LONG TERM GOAL #1   Title  Pt will be independent in her HEP and progression.    Time  6    Status  On-going      PT LONG TERM GOAL #2   Title  Pt will be able to improve bilateral shoulder flexion to >/= 160 degrees.    Baseline  see flow sheets    Time  6    Period  Weeks    Status  On-going      PT LONG TERM GOAL #3   Title  Pt will be able to increase her cervical rotation to >/= 40 degrees bilaterally to improve function and driving.    Baseline  see flow sheets     Time  6    Period  Weeks    Status  Achieved      PT LONG TERM GOAL #4   Title  Pt will be able to report driving more than 30 minutes with pain </= 3/10.    Baseline  currently pain varies.    Time  6    Period  Weeks    Status  On-going            Plan - 12/11/19 1448    Clinical Impression Statement  She has not been to PT since 11/06/19 so POC date is out and she needed recert. She has overall improved her neck ROM some but continues to be limited with this as well as Rt shoulder ROM. She continues to have pain limiting her funciton and is currently still out of work. She will benefit from continued PT to address her deficits.    Examination-Activity Limitations  Sit;Lift;Reach Overhead    Examination-Participation Restrictions  Other;Driving    Stability/Clinical Decision Making  Evolving/Moderate complexity    Rehab Potential  Good    PT Frequency  2x / week    PT Duration  6 weeks    PT Treatment/Interventions  ADLs/Self Care Home Management;Cryotherapy;Electrical Stimulation;Iontophoresis 4mg /ml Dexamethasone;Moist Heat;Traction;Ultrasound;Functional mobility training;Stair training;Gait training;Therapeutic activities;Therapeutic exercise;Balance training;Neuromuscular re-education;Patient/family education;Manual techniques;Taping;Passive range of motion;Dry needling    PT Next Visit Plan  add strengthening into program, graded tolerance to activity    PT Home Exercise Plan  [id number]    Consulted and Agree with Plan of Care  Patient       Patient will benefit from skilled therapeutic intervention in order to improve the following deficits and impairments:  Pain, Postural dysfunction, Decreased activity tolerance, Impaired flexibility, Decreased strength, Decreased range of motion, Decreased mobility  Visit Diagnosis: Cervicalgia  Chronic left shoulder pain  Chronic right shoulder pain  Pain in thoracic spine  Muscle weakness (  generalized)     Problem  List Patient Active Problem List   Diagnosis Date Noted  . Abnormal MRI, cervical spine 12/08/2019  . Cervicalgia 12/08/2019  . Neck pain on left side 08/15/2019  . B12 deficiency 06/02/2019  . Vertigo 04/20/2019  . Closed head injury 04/18/2019  . Dysphagia 11/13/2018  . Epigastric pain 11/13/2018  . Anemia 11/13/2018  . Asthma 10/10/2018  . Bad breath 05/25/2016  . Abnormal TSH 05/09/2015  . Other and unspecified ovarian cyst 05/30/2013  . Chronic constipation 04/17/2013  . Encounter for well adult exam with abnormal findings 10/12/2010  . Hyperlipidemia 03/26/2007  . Anxiety state 03/26/2007  . Depression 03/26/2007  . Essential hypertension 03/26/2007  . Allergic rhinitis 03/26/2007  . GERD 03/26/2007  . IBS 03/26/2007  . COLONIC POLYPS, HX OF 03/26/2007    Silvestre Mesi 12/11/2019, 3:01 PM  [facility] Physical Therapy [address] [place], [place], [address] Phone: [phone]   Fax:  [phone]  Name: [name] MRN: [id number] Date of Birth: 07/16/1966

## 2019-12-12 ENCOUNTER — Encounter: Payer: BC Managed Care – PPO | Admitting: Rehabilitative and Restorative Service Providers"

## 2019-12-12 ENCOUNTER — Encounter: Payer: Self-pay | Admitting: Internal Medicine

## 2019-12-13 ENCOUNTER — Telehealth: Payer: Self-pay | Admitting: Physical Therapy

## 2019-12-13 ENCOUNTER — Other Ambulatory Visit: Payer: Self-pay | Admitting: Internal Medicine

## 2019-12-13 ENCOUNTER — Encounter: Payer: BC Managed Care – PPO | Admitting: Physical Therapy

## 2019-12-13 NOTE — Telephone Encounter (Signed)
Pt no show for PT appointment today. They were contacted and informed of this via voicemail. They were provided the date and time of their next appointment on voicemail. They were instructed to call us to let us know if they cannot make their appointment.  Elsie Ra, PT, DPT 12/13/19 11:24 AM

## 2019-12-13 NOTE — Telephone Encounter (Signed)
Done erx 

## 2019-12-19 ENCOUNTER — Ambulatory Visit (INDEPENDENT_AMBULATORY_CARE_PROVIDER_SITE_OTHER): Payer: BC Managed Care – PPO | Admitting: Physical Therapy

## 2019-12-19 ENCOUNTER — Other Ambulatory Visit: Payer: Self-pay

## 2019-12-19 DIAGNOSIS — M542 Cervicalgia: Secondary | ICD-10-CM | POA: Diagnosis not present

## 2019-12-19 DIAGNOSIS — M546 Pain in thoracic spine: Secondary | ICD-10-CM | POA: Diagnosis not present

## 2019-12-19 DIAGNOSIS — M25512 Pain in left shoulder: Secondary | ICD-10-CM

## 2019-12-19 DIAGNOSIS — M25511 Pain in right shoulder: Secondary | ICD-10-CM | POA: Diagnosis not present

## 2019-12-19 DIAGNOSIS — G8929 Other chronic pain: Secondary | ICD-10-CM

## 2019-12-19 DIAGNOSIS — M6281 Muscle weakness (generalized): Secondary | ICD-10-CM

## 2019-12-19 NOTE — Therapy (Addendum)
[facility] Physical Therapy [address] [place], [place], [address] Phone: [phone]   Fax:  [phone]  Physical Therapy Treatment/Discharge addendum PHYSICAL THERAPY DISCHARGE SUMMARY  Visits from Start of Care: 7 Current functional level related to goals / functional outcomes: See below   Remaining deficits: See below   Education / Equipment: HEP  Plan: Patient agrees to discharge.  Patient goals were not met. Patient is being discharged due to not returning since the last visit.  ?????  Elsie Ra, PT, DPT 02/26/20 1:15 PM      Patient Details  Name: [name] MRN: [id number] Date of Birth: 28-Jul-1965 Referring Provider (PT): Eunice Blase, MD   Encounter Date: 12/19/2019  PT End of Session - 12/19/19 1608    Visit Number  7    Number of Visits  12    Date for PT Re-Evaluation  01/08/20    PT Start Time  5885    PT Stop Time  1545    PT Time Calculation (min)  15 min    Activity Tolerance  Patient tolerated treatment well    Behavior During Therapy  [facility] for tasks assessed/performed       Past Medical History:  Diagnosis Date  . Allergic rhinitis   . Allergy   . Anxiety   . Depression   . GERD (gastroesophageal reflux disease)   . Hyperlipidemia   . Hyperplastic colon polyp 10/2003  . Hypertension   . Menorrhagia   . Obesity   . PONV (postoperative nausea and vomiting)   . RLS (restless legs syndrome)     Past Surgical History:  Procedure Laterality Date  . COLONOSCOPY    . CYSTOSCOPY N/A 10/12/2016   Procedure: CYSTOSCOPY;  Surgeon: Anastasio Auerbach, MD;  Location: [facility];  Service: Gynecology;  Laterality: N/A;  . ENDOMETRIAL ABLATION  2007   Dr. Landry Mellow at [facility]  . FOOT SURGERY    . LAPAROSCOPIC VAGINAL HYSTERECTOMY WITH SALPINGECTOMY Bilateral 10/12/2016   Procedure: LAPAROSCOPIC ASSISTED VAGINAL HYSTERECTOMY WITH SALPINGECTOMY;  Surgeon: Anastasio Auerbach, MD;  Location: [facility];  Service: Gynecology;   Laterality: Bilateral;  request to follow first case at around 10:30am.  Requests 2 1/2 hours for the case.  Marland Kitchen SALIVARY GLAND SURGERY  12-11   LEFT SIDE NODULE REMOVED WAS BENIGN  . TUBAL LIGATION      There were no vitals filed for this visit.  Subjective Assessment - 12/19/19 1606    Subjective  relays neck pain is okay, most of pain is in her Rt shoulder and still with some N/T    Patient Stated Goals  Stop hurting, Sleep better    Pain Onset  More than a month ago         [facility] Adult PT Treatment/Exercise - 12/19/19 0001      Neck Exercises: Theraband   Rows  20 reps;Red    Rows Limitations  standing    Shoulder External Rotation Limitations  bilat with red X 20 reps      Neck Exercises: Seated   Other Seated Exercise  Pulleys 2 min flexion, 2 min scaption      Traction   Type of Traction  --   attempted but machine not working      PT Long Term Goals - 12/11/19 1459      PT LONG TERM GOAL #1   Title  Pt will be independent in her HEP and progression.    Time  6  Status  On-going      PT LONG TERM GOAL #2   Title  Pt will be able to improve bilateral shoulder flexion to >/= 160 degrees.    Baseline  see flow sheets    Time  6    Period  Weeks    Status  On-going      PT LONG TERM GOAL #3   Title  Pt will be able to increase her cervical rotation to >/= 40 degrees bilaterally to improve function and driving.    Baseline  see flow sheets    Time  6    Period  Weeks    Status  Achieved      PT LONG TERM GOAL #4   Title  Pt will be able to report driving more than 30 minutes with pain </= 3/10.    Baseline  currently pain varies.    Time  6    Period  Weeks    Status  On-going            Plan - 12/19/19 1609    Clinical Impression Statement  Short session today as she was 15 min late. Continued with neck/shoulder ROM and light strengtheningg with fair tolerance, attempted mechanical traction but machine was not working at clinic. PT may try  manual traction next visit and will continue with gradual exercise progression as able depending on her pain and tolerance.    Examination-Activity Limitations  Sit;Lift;Reach Overhead    Examination-Participation Restrictions  Other;Driving    Stability/Clinical Decision Making  Evolving/Moderate complexity    Rehab Potential  Good    PT Frequency  2x / week    PT Duration  6 weeks    PT Treatment/Interventions  ADLs/Self Care Home Management;Cryotherapy;Electrical Stimulation;Iontophoresis 89m/ml Dexamethasone;Moist Heat;Traction;Ultrasound;Functional mobility training;Stair training;Gait training;Therapeutic activities;Therapeutic exercise;Balance training;Neuromuscular re-education;Patient/family education;Manual techniques;Taping;Passive range of motion;Dry needling    PT Next Visit Plan  add strengthening into program, graded tolerance to activity    PT Home Exercise Plan  [id number]   Consulted and Agree with Plan of Care  Patient       Patient will benefit from skilled therapeutic intervention in order to improve the following deficits and impairments:  Pain, Postural dysfunction, Decreased activity tolerance, Impaired flexibility, Decreased strength, Decreased range of motion, Decreased mobility  Visit Diagnosis: Cervicalgia  Chronic left shoulder pain  Chronic right shoulder pain  Pain in thoracic spine  Muscle weakness (generalized)     Problem List Patient Active Problem List   Diagnosis Date Noted  . Abnormal MRI, cervical spine 12/08/2019  . Cervicalgia 12/08/2019  . Neck pain on left side 08/15/2019  . B12 deficiency 06/02/2019  . Vertigo 04/20/2019  . Closed head injury 04/18/2019  . Dysphagia 11/13/2018  . Epigastric pain 11/13/2018  . Anemia 11/13/2018  . Asthma 10/10/2018  . Bad breath 05/25/2016  . Abnormal TSH 05/09/2015  . Other and unspecified ovarian cyst 05/30/2013  . Chronic constipation 04/17/2013  . Encounter for well adult exam with abnormal  findings 10/12/2010  . Hyperlipidemia 03/26/2007  . Anxiety state 03/26/2007  . Depression 03/26/2007  . Essential hypertension 03/26/2007  . Allergic rhinitis 03/26/2007  . GERD 03/26/2007  . IBS 03/26/2007  . COLONIC POLYPS, HX OF 03/26/2007    BSilvestre Mesi6/08/2019, 4:12 PM  [facility] [address] [place] [address]Phone: [phone]  Fax:  [phone] Name: Tara STANKEMRN: [id number] of Birth: 1Dec 26, 1967

## 2019-12-21 ENCOUNTER — Encounter: Payer: Self-pay | Admitting: Family Medicine

## 2019-12-21 ENCOUNTER — Encounter: Payer: BC Managed Care – PPO | Admitting: Physical Therapy

## 2019-12-21 ENCOUNTER — Encounter: Payer: Self-pay | Admitting: Physical Therapy

## 2019-12-24 ENCOUNTER — Encounter: Payer: Self-pay | Admitting: Family Medicine

## 2019-12-24 ENCOUNTER — Other Ambulatory Visit: Payer: Self-pay

## 2019-12-24 ENCOUNTER — Ambulatory Visit
Admission: RE | Admit: 2019-12-24 | Discharge: 2019-12-24 | Disposition: A | Discharge status: Home / Self Care | Payer: BC Managed Care – PPO | Source: Ambulatory Visit | Attending: Internal Medicine | Admitting: Internal Medicine

## 2019-12-24 DIAGNOSIS — Z1231 Encounter for screening mammogram for malignant neoplasm of breast: Secondary | ICD-10-CM

## 2019-12-24 DIAGNOSIS — M542 Cervicalgia: Secondary | ICD-10-CM

## 2020-01-09 ENCOUNTER — Ambulatory Visit
Admission: RE | Admit: 2020-01-09 | Discharge: 2020-01-09 | Disposition: A | Discharge status: Home / Self Care | Payer: BC Managed Care – PPO | Source: Ambulatory Visit | Attending: Internal Medicine | Admitting: Internal Medicine

## 2020-01-09 ENCOUNTER — Other Ambulatory Visit: Payer: Self-pay

## 2020-01-09 DIAGNOSIS — R937 Abnormal findings on diagnostic imaging of other parts of musculoskeletal system: Secondary | ICD-10-CM

## 2020-01-09 DIAGNOSIS — M542 Cervicalgia: Secondary | ICD-10-CM

## 2020-01-11 ENCOUNTER — Encounter: Payer: Self-pay | Admitting: Internal Medicine

## 2020-01-11 ENCOUNTER — Other Ambulatory Visit: Payer: Self-pay

## 2020-01-11 ENCOUNTER — Ambulatory Visit (INDEPENDENT_AMBULATORY_CARE_PROVIDER_SITE_OTHER): Payer: BC Managed Care – PPO | Admitting: Internal Medicine

## 2020-01-11 DIAGNOSIS — I10 Essential (primary) hypertension: Secondary | ICD-10-CM

## 2020-01-11 DIAGNOSIS — R937 Abnormal findings on diagnostic imaging of other parts of musculoskeletal system: Secondary | ICD-10-CM

## 2020-01-11 DIAGNOSIS — F411 Generalized anxiety disorder: Secondary | ICD-10-CM | POA: Diagnosis not present

## 2020-01-11 NOTE — Progress Notes (Signed)
Subjective:    Patient ID: [name], female    DOB: 10-13-1965, 54 y.o.   MRN: [id number]  HPI  Here to f/u recent repeat MRI results with some ? Of bony lesion on the first;  Denies worsenig msk or neck pain though still moderate to chronic no change and plans to f/u with surgury soon; Pt denies chest pain, increasing sob or doe, wheezing, orthopnea, PND, increased LE swelling, palpitations, dizziness or syncope.  Pt denies new neurological symptoms such as new headache, or facial or extremity weakness or numbness.  Pt denies polydipsia, polyuria,.  Denies worsening depressive symptoms, suicidal ideation, or panic Past Medical History:  Diagnosis Date  . Allergic rhinitis   . Allergy   . Anxiety   . Depression   . GERD (gastroesophageal reflux disease)   . Hyperlipidemia   . Hyperplastic colon polyp 10/2003  . Hypertension   . Menorrhagia   . Obesity   . PONV (postoperative nausea and vomiting)   . RLS (restless legs syndrome)    Past Surgical History:  Procedure Laterality Date  . COLONOSCOPY    . CYSTOSCOPY N/A 10/12/2016   Procedure: CYSTOSCOPY;  Surgeon: Anastasio Auerbach, MD;  Location: [facility];  Service: Gynecology;  Laterality: N/A;  . ENDOMETRIAL ABLATION  2007   Dr. Landry Mellow at [facility]  . FOOT SURGERY    . LAPAROSCOPIC VAGINAL HYSTERECTOMY WITH SALPINGECTOMY Bilateral 10/12/2016   Procedure: LAPAROSCOPIC ASSISTED VAGINAL HYSTERECTOMY WITH SALPINGECTOMY;  Surgeon: Anastasio Auerbach, MD;  Location: [facility];  Service: Gynecology;  Laterality: Bilateral;  request to follow first case at around 10:30am.  Requests 2 1/2 hours for the case.  Marland Kitchen SALIVARY GLAND SURGERY  12-11   LEFT SIDE NODULE REMOVED WAS BENIGN  . TUBAL LIGATION      reports that she has never smoked. She has never used smokeless tobacco. She reports previous alcohol use. She reports that she does not use drugs. family history includes Cancer in her mother; Colon cancer in her mother; Colon polyps in  her father; Heart attack in her maternal grandfather; Hyperlipidemia in an other family member; Hypertension in an other family member. Allergies  Allergen Reactions  . Other     POLLEN AND DUST  . Sulfur Hives   Current Outpatient Medications on File Prior to Visit  Medication Sig Dispense Refill  . amLODipine (NORVASC) 5 MG tablet Take 1 tablet (5 mg total) by mouth daily. 90 tablet 3  . clonazePAM (KLONOPIN) 0.5 MG tablet Take 1 tablet by mouth twice daily as needed for anxiety 60 tablet 2  . Coenzyme Q10 (COQ10 PO) Take by mouth.    . famotidine (PEPCID) 20 MG tablet Take 1 tablet (20 mg total) by mouth 2 (two) times daily. 180 tablet 3  . fexofenadine (ALLEGRA) 180 MG tablet Take 1 tablet (180 mg total) by mouth daily. 90 tablet 2  . fluconazole (DIFLUCAN) 150 MG tablet TAKE ONE TABLET BY MOUTH AS A ONE-TIME DOSE 1 tablet 0  . Fluocinolone Acetonide Body 0.01 % OIL Apply 1 application topically 2 (two) times daily as needed (flaking of scalp).   11  . Fluticasone-Salmeterol (ADVAIR DISKUS) 250-50 MCG/DOSE AEPB Inhale 1 puff into the lungs 2 (two) times daily. 1 each 11  . hydrochlorothiazide (MICROZIDE) 12.5 MG capsule Take 1 capsule (12.5 mg total) by mouth daily. 90 capsule 3  . ketoconazole (NIZORAL) 2 % shampoo Apply 1 application topically once a week.     . linaclotide (  LINZESS) 145 MCG CAPS capsule Take 1 capsule (145 mcg total) by mouth daily. 30 capsule 5  . losartan (COZAAR) 100 MG tablet Take 1 tablet (100 mg total) by mouth daily. 90 tablet 3  . meclizine (ANTIVERT) 12.5 MG tablet Take 1 tablet (12.5 mg total) by mouth 3 (three) times daily as needed for dizziness. 30 tablet 1  . pantoprazole (PROTONIX) 40 MG tablet Take 1 tablet (40 mg total) by mouth 2 (two) times daily. 180 tablet 3  . potassium chloride (KLOR-CON) 10 MEQ tablet Take 2 tablets (20 mEq total) by mouth daily. 180 tablet 3  . rosuvastatin (CRESTOR) 10 MG tablet Take 1 tablet (10 mg total) by mouth daily. 90  tablet 3  . sertraline (ZOLOFT) 100 MG tablet Take 1.5 tablets (150 mg total) by mouth daily. 132 tablet 3  . tiZANidine (ZANAFLEX) 2 MG tablet TAKE 1 TO 2 TABLETS BY MOUTH EVERY 6 HOURS AS NEEDED FOR MUSCLE SPASM 60 tablet 0  . traMADol (ULTRAM) 50 MG tablet TAKE 1 TABLET BY MOUTH EVERY 6 HOURS AS NEEDED 20 tablet 0  . triamcinolone (NASACORT) 55 MCG/ACT AERO nasal inhaler Place 2 sprays into the nose daily. 1 Inhaler 12  . Turmeric (QC TUMERIC COMPLEX PO) Take by mouth.    . [DISCONTINUED] fluticasone (FLONASE) 50 MCG/ACT nasal spray 2 sprays by Nasal route daily. 16 g 2   No current facility-administered medications on file prior to visit.   Review of Systems All otherwise neg per pt     Objective:   Physical Exam BP 132/90 (BP Location: Left Arm, Patient Position: Sitting, Cuff Size: Large)   Pulse 76   Temp 98.2 F (36.8 C) (Oral)   Ht 5\' 2"  (1.575 m)   Wt 178 lb (80.7 kg)   LMP 10/01/2016   SpO2 99%   BMI 32.56 kg/m  VS noted,  Constitutional: Pt appears in NAD HENT: Head: NCAT.  Right Ear: External ear normal.  Left Ear: External ear normal.  Eyes: . Pupils are equal, round, and reactive to light. Conjunctivae and EOM are normal Nose: without d/c or deformity Neck: Neck supple. Gross normal ROM Cardiovascular: Normal rate and regular rhythm.   Pulmonary/Chest: Effort normal and breath sounds without rales or wheezing.  Abd:  Soft, NT, ND, + BS, no organomegaly Neurological: Pt is alert. At baseline orientation, motor grossly intact Skin: Skin is warm. No rashes, other new lesions, no LE edema Psychiatric: Pt behavior is normal without agitation  All otherwise neg per pt  Lab Results  Component Value Date   WBC 8.7 12/07/2019   HGB 12.6 12/07/2019   HCT 37.6 12/07/2019   PLT 246.0 12/07/2019   GLUCOSE 98 12/07/2019   CHOL 238 (H) 12/07/2019   TRIG 94.0 12/07/2019   HDL 49.90 12/07/2019   LDLDIRECT 156.0 04/17/2013   LDLCALC 170 (H) 12/07/2019   ALT 7  12/07/2019   AST 14 12/07/2019   NA 137 12/07/2019   K 3.7 12/07/2019   CL 103 12/07/2019   CREATININE 0.84 12/07/2019   BUN 11 12/07/2019   CO2 30 12/07/2019   TSH 0.86 12/07/2019        Assessment & Plan:

## 2020-01-12 ENCOUNTER — Encounter: Payer: Self-pay | Admitting: Internal Medicine

## 2020-01-12 NOTE — Patient Instructions (Signed)
Please continue all other medications as before, and refills have been done if requested.  Please have the pharmacy call with any other refills you may need.  Please continue your efforts at being more active, low cholesterol diet, and weight control.  Please keep your appointments with your specialists as you may have planned     

## 2020-01-12 NOTE — Assessment & Plan Note (Signed)
Reassured, o/w stable, cont same tx

## 2020-01-12 NOTE — Assessment & Plan Note (Signed)
stalbe

## 2020-01-12 NOTE — Assessment & Plan Note (Addendum)
D/w pt, no evidence for bony lesion or suggestion of malignancy, will need f/u with surgury as planned with ongoing current pain  I spent 31 minutes in preparing to see the patient by review of recent labs, imaging and procedures, obtaining and reviewing separately obtained history, communicating with the patient and family or caregiver, ordering medications, tests or procedures, and documenting clinical information in the EHR including the differential Dx, treatment, and any further evaluation and other management of abnormal mri c spine, htn, anxiety

## 2020-01-23 ENCOUNTER — Encounter: Payer: Self-pay | Admitting: Family Medicine

## 2020-01-29 ENCOUNTER — Ambulatory Visit: Payer: BC Managed Care – PPO | Admitting: Emergency Medicine

## 2020-02-06 ENCOUNTER — Other Ambulatory Visit: Payer: Self-pay | Admitting: Neurosurgery

## 2020-02-06 ENCOUNTER — Other Ambulatory Visit: Payer: Self-pay

## 2020-02-06 ENCOUNTER — Ambulatory Visit
Admission: RE | Admit: 2020-02-06 | Discharge: 2020-02-06 | Disposition: A | Discharge status: Home / Self Care | Payer: BC Managed Care – PPO | Source: Ambulatory Visit | Attending: Neurosurgery | Admitting: Neurosurgery

## 2020-02-06 DIAGNOSIS — R930 Abnormal findings on diagnostic imaging of skull and head, not elsewhere classified: Secondary | ICD-10-CM

## 2020-02-06 MED ORDER — GADOBENATE DIMEGLUMINE 529 MG/ML IV SOLN
10.0000 mL | Freq: Once | INTRAVENOUS | Status: AC | PRN
  Administered 2020-02-06: 10 mL via INTRAVENOUS

## 2020-02-08 NOTE — Telephone Encounter (Signed)
Holding for Terri to advise.

## 2020-04-24 ENCOUNTER — Other Ambulatory Visit: Payer: Self-pay | Admitting: *Deleted

## 2020-04-24 NOTE — Telephone Encounter (Signed)
Left message for patient to call.

## 2020-04-24 NOTE — Telephone Encounter (Signed)
Can you verify if she is having symptoms of a yeast infection?

## 2020-05-16 ENCOUNTER — Other Ambulatory Visit: Payer: Self-pay

## 2020-05-16 ENCOUNTER — Telehealth (INDEPENDENT_AMBULATORY_CARE_PROVIDER_SITE_OTHER): Payer: BC Managed Care – PPO | Admitting: Family Medicine

## 2020-05-16 ENCOUNTER — Encounter: Payer: Self-pay | Admitting: Family Medicine

## 2020-05-16 DIAGNOSIS — E860 Dehydration: Secondary | ICD-10-CM | POA: Diagnosis not present

## 2020-05-16 DIAGNOSIS — R110 Nausea: Secondary | ICD-10-CM | POA: Diagnosis not present

## 2020-05-16 MED ORDER — ONDANSETRON 4 MG PO TBDP: 4.0000 mg | ORAL_TABLET | Freq: Three times a day (TID) | ORAL | 0 refills | Status: DC | PRN

## 2020-05-16 NOTE — Progress Notes (Signed)
Chief Complaint  Patient presents with  . Dizziness  . Headache    Subjective: Patient is a 54 y.o. female here for HA. Due to COVID-19 pandemic, we are interacting via web portal for an electronic face-to-face visit. I verified patient's ID using 2 identifiers. Patient agreed to proceed with visit via this method. Patient is at home, I am at office. Patient and I are present for visit.   Got her COVID-19 vaccine booster 2 weeks ago. Started getting HA's and lightheadedness a/w it. This started the next day after the booster. She feels "dried out". No fevers. She has had racing heartbeat as well. She has some nausea and decreased appetite. No diarrhea anymore. Has stopped HCTZ. Drinking less water than before. Denies numbness/tingling, vision changes, weakness.    Past Medical History:  Diagnosis Date  . Allergic rhinitis   . Allergy   . Anxiety   . Depression   . GERD (gastroesophageal reflux disease)   . Hyperlipidemia   . Hyperplastic colon polyp 10/2003  . Hypertension   . Menorrhagia   . Obesity   . PONV (postoperative nausea and vomiting)   . RLS (restless legs syndrome)     Objective: LMP 10/01/2016  No conversational dyspnea Age appropriate judgment and insight Nml affect and mood  Assessment and Plan: Dehydration  Nausea - Plan: ondansetron (ZOFRAN-ODT) 4 MG disintegrating tablet  She needs to drink around 55-60 oz daily instead of 30-40 oz that she currently is. Add Zofran for slight and lingering nausea as this will help her ability to drink. Doubt anything sinister. F/u prn.  The patient voiced understanding and agreement to the plan.  [place], DO 05/16/20  3:41 PM

## 2020-05-19 ENCOUNTER — Other Ambulatory Visit: Payer: Self-pay

## 2020-05-19 ENCOUNTER — Encounter: Payer: Self-pay | Admitting: Nurse Practitioner

## 2020-05-19 ENCOUNTER — Ambulatory Visit: Payer: BC Managed Care – PPO | Admitting: Nurse Practitioner

## 2020-05-19 VITALS — BP 130/80

## 2020-05-19 DIAGNOSIS — N898 Other specified noninflammatory disorders of vagina: Secondary | ICD-10-CM | POA: Diagnosis not present

## 2020-05-19 DIAGNOSIS — R350 Frequency of micturition: Secondary | ICD-10-CM

## 2020-05-19 LAB — WET PREP FOR TRICH, YEAST, CLUE

## 2020-05-19 NOTE — Progress Notes (Signed)
   Acute Office Visit  Subjective:    Patient ID: [name], female    DOB: Dec 31, 1965, 54 y.o.   MRN: [id number]   HPI 54 y.o. presents today for urinary frequency, urgency, and back pain that started 2-3 weeks ago. She also complains of vaginal itching that started around the same time. She was seen a few days ago by family medicine for headaches and dehydration. No new sexual partners. She is currently moving and thinks the back pain could be from heavy lifting.    Review of Systems  Constitutional: Negative.   Gastrointestinal: Negative.   Genitourinary: Positive for frequency and urgency. Negative for dysuria, vaginal discharge and vaginal pain.       Vaginal itching  Musculoskeletal: Positive for back pain.       Objective:    Physical Exam Constitutional:      Appearance: Normal appearance.  Abdominal:     Tenderness: There is no right CVA tenderness or left CVA tenderness.  Genitourinary:    General: Normal vulva.     Vagina: Normal.     Uterus: Absent.      BP 130/80   LMP 10/01/2016  Wt Readings from Last 3 Encounters:  01/11/20 178 lb (80.7 kg)  12/07/19 165 lb (74.8 kg)  09/05/19 178 lb (80.7 kg)        Assessment & Plan:   Problem List Items Addressed This Visit    None    Visit Diagnoses    Frequency of urination    -  Primary   Relevant Orders   Urinalysis,Complete w/RFL Culture   Vaginal itching       Relevant Orders   WET PREP FOR [place], YEAST, CLUE      Plan: Reassurance provided on normal UA, wet prep, and exam. Recommend increasing water intake and avoiding acidic beverages such as coffee, tea, and soda. Back pain most likely from strain from lifting heavy boxes/furniture. Follow up as needed. She is agreeable to plan.     Tamela Gammon [facility], 11:56 AM 05/19/2020

## 2020-05-21 LAB — URINALYSIS, COMPLETE W/RFL CULTURE
Bilirubin Urine: NEGATIVE
Glucose, UA: NEGATIVE
Hgb urine dipstick: NEGATIVE
Hyaline Cast: NONE SEEN /LPF
Ketones, ur: NEGATIVE
Leukocyte Esterase: NEGATIVE
Nitrites, Initial: NEGATIVE
RBC / HPF: NONE SEEN /HPF (ref 0–2)
Specific Gravity, Urine: 1.02 (ref 1.001–1.03)
pH: 7 (ref 5.0–8.0)

## 2020-05-21 LAB — URINE CULTURE
MICRO NUMBER:: 11142809
SPECIMEN QUALITY:: ADEQUATE

## 2020-05-21 LAB — CULTURE INDICATED

## 2020-06-24 ENCOUNTER — Encounter: Payer: Self-pay | Admitting: Internal Medicine

## 2020-06-24 ENCOUNTER — Other Ambulatory Visit: Payer: Self-pay

## 2020-06-24 ENCOUNTER — Ambulatory Visit: Payer: BC Managed Care – PPO | Admitting: Internal Medicine

## 2020-06-24 VITALS — BP 146/98 | HR 73 | Temp 98.4°F | Resp 16 | Ht 62.0 in | Wt 181.0 lb

## 2020-06-24 DIAGNOSIS — J4530 Mild persistent asthma, uncomplicated: Secondary | ICD-10-CM | POA: Diagnosis not present

## 2020-06-24 DIAGNOSIS — M5441 Lumbago with sciatica, right side: Secondary | ICD-10-CM

## 2020-06-24 DIAGNOSIS — R0600 Dyspnea, unspecified: Secondary | ICD-10-CM | POA: Insufficient documentation

## 2020-06-24 DIAGNOSIS — M5442 Lumbago with sciatica, left side: Secondary | ICD-10-CM | POA: Diagnosis not present

## 2020-06-24 DIAGNOSIS — I10 Essential (primary) hypertension: Secondary | ICD-10-CM | POA: Diagnosis not present

## 2020-06-24 DIAGNOSIS — R0609 Other forms of dyspnea: Secondary | ICD-10-CM

## 2020-06-24 MED ORDER — MELOXICAM 15 MG PO TABS: 15.0000 mg | ORAL_TABLET | Freq: Every day | ORAL | 0 refills | Status: DC

## 2020-06-24 MED ORDER — FLUTICASONE-SALMETEROL 250-50 MCG/DOSE IN AEPB: 1.0000 | INHALATION_SPRAY | Freq: Two times a day (BID) | RESPIRATORY_TRACT | 1 refills | Status: DC

## 2020-06-24 MED ORDER — TIZANIDINE HCL 2 MG PO CAPS: 2.0000 mg | ORAL_CAPSULE | Freq: Three times a day (TID) | ORAL | 1 refills | Status: DC | PRN

## 2020-06-24 NOTE — Patient Instructions (Signed)
Acute Back Pain, Adult Acute back pain is sudden and usually short-lived. It is often caused by an injury to the muscles and tissues in the back. The injury may result from:  A muscle or ligament getting overstretched or torn (strained). Ligaments are tissues that connect bones to each other. Lifting something improperly can cause a back strain.  Wear and tear (degeneration) of the spinal disks. Spinal disks are circular tissue that provides cushioning between the bones of the spine (vertebrae).  Twisting motions, such as while playing sports or doing yard work.  A hit to the back.  Arthritis. You may have a physical exam, lab tests, and imaging tests to find the cause of your pain. Acute back pain usually goes away with rest and home care. Follow these instructions at home: Managing pain, stiffness, and swelling  Take over-the-counter and prescription medicines only as told by your health care provider.  Your health care provider may recommend applying ice during the first 24-48 hours after your pain starts. To do this: ? Put ice in a plastic bag. ? Place a towel between your skin and the bag. ? Leave the ice on for 20 minutes, 2-3 times a day.  If directed, apply heat to the affected area as often as told by your health care provider. Use the heat source that your health care provider recommends, such as a moist heat pack or a heating pad. ? Place a towel between your skin and the heat source. ? Leave the heat on for 20-30 minutes. ? Remove the heat if your skin turns bright red. This is especially important if you are unable to feel pain, heat, or cold. You have a greater risk of getting burned. Activity   Do not stay in bed. Staying in bed for more than 1-2 days can delay your recovery.  Sit up and stand up straight. Avoid leaning forward when you sit, or hunching over when you stand. ? If you work at a desk, sit close to it so you do not need to lean over. Keep your chin tucked  in. Keep your neck drawn back, and keep your elbows bent at a right angle. Your arms should look like the letter "L." ? Sit high and close to the steering wheel when you drive. Add lower back (lumbar) support to your car seat, if needed.  Take short walks on even surfaces as soon as you are able. Try to increase the length of time you walk each day.  Do not sit, drive, or stand in one place for more than 30 minutes at a time. Sitting or standing for long periods of time can put stress on your back.  Do not drive or use heavy machinery while taking prescription pain medicine.  Use proper lifting techniques. When you bend and lift, use positions that put less stress on your back: ? Bend your knees. ? Keep the load close to your body. ? Avoid twisting.  Exercise regularly as told by your health care provider. Exercising helps your back heal faster and helps prevent back injuries by keeping muscles strong and flexible.  Work with a physical therapist to make a safe exercise program, as recommended by your health care provider. Do any exercises as told by your physical therapist. Lifestyle  Maintain a healthy weight. Extra weight puts stress on your back and makes it difficult to have good posture.  Avoid activities or situations that make you feel anxious or stressed. Stress and anxiety increase muscle   tension and can make back pain worse. Learn ways to manage anxiety and stress, such as through exercise. General instructions  Sleep on a firm mattress in a comfortable position. Try lying on your side with your knees slightly bent. If you lie on your back, put a pillow under your knees.  Follow your treatment plan as told by your health care provider. This may include: ? Cognitive or behavioral therapy. ? Acupuncture or massage therapy. ? Meditation or yoga. Contact a health care provider if:  You have pain that is not relieved with rest or medicine.  You have increasing pain going down  into your legs or buttocks.  Your pain does not improve after 2 weeks.  You have pain at night.  You lose weight without trying.  You have a fever or chills. Get help right away if:  You develop new bowel or bladder control problems.  You have unusual weakness or numbness in your arms or legs.  You develop nausea or vomiting.  You develop abdominal pain.  You feel faint. Summary  Acute back pain is sudden and usually short-lived.  Use proper lifting techniques. When you bend and lift, use positions that put less stress on your back.  Take over-the-counter and prescription medicines and apply heat or ice as directed by your health care provider. This information is not intended to replace advice given to you by your health care provider. Make sure you discuss any questions you have with your health care provider. Document Revised: 10/24/2018 Document Reviewed: 02/16/2017 Elsevier Patient Education  2020 [employer]  

## 2020-06-24 NOTE — Progress Notes (Signed)
Subjective:  Patient ID: [name], female    DOB: 10-27-1965  Age: 54 y.o. MRN: [id number]  CC: Back Pain and Hypertension  This visit occurred during the SARS-CoV-2 public health emergency.  Safety protocols were in place, including screening questions prior to the visit, additional usage of staff PPE, and extensive cleaning of exam room while observing appropriate contact time as indicated for disinfecting solutions.    HPI Alfretta Pinch presents for f/up -  1.  She complains of a 1 day history of low back pain after squatting and lifting.  She describes it as an achy sensation that radiates into both thighs.  She is getting some symptom relief with gabapentin.  She denies lower extremity paresthesias.  2.  She is also concerned that her blood pressure is not well controlled.  She has not been working on her lifestyle modifications and continues to gain weight.  For the last week she has had mild shortness of breath and dyspnea on exertion.  She denies chest pain, diaphoresis, dizziness, lightheadedness, headache, or blurred vision.  Outpatient Medications Prior to Visit  Medication Sig Dispense Refill  . amLODipine (NORVASC) 5 MG tablet Take 1 tablet (5 mg total) by mouth daily. 90 tablet 3  . clonazePAM (KLONOPIN) 0.5 MG tablet Take 1 tablet by mouth twice daily as needed for anxiety 60 tablet 2  . Coenzyme Q10 (COQ10 PO) Take by mouth.    . famotidine (PEPCID) 20 MG tablet Take 1 tablet (20 mg total) by mouth 2 (two) times daily. 180 tablet 3  . fexofenadine (ALLEGRA) 180 MG tablet Take 1 tablet (180 mg total) by mouth daily. 90 tablet 2  . Fluocinolone Acetonide Body 0.01 % OIL Apply 1 application topically 2 (two) times daily as needed (flaking of scalp).   11  . hydrochlorothiazide (MICROZIDE) 12.5 MG capsule Take 1 capsule (12.5 mg total) by mouth daily. 90 capsule 3  . linaclotide (LINZESS) 145 MCG CAPS capsule Take 1 capsule (145 mcg total) by mouth daily. 30  capsule 5  . losartan (COZAAR) 100 MG tablet Take 1 tablet (100 mg total) by mouth daily. 90 tablet 3  . meclizine (ANTIVERT) 12.5 MG tablet Take 1 tablet (12.5 mg total) by mouth 3 (three) times daily as needed for dizziness. 30 tablet 1  . ondansetron (ZOFRAN-ODT) 4 MG disintegrating tablet Take 1 tablet (4 mg total) by mouth every 8 (eight) hours as needed for nausea or vomiting. 20 tablet 0  . pantoprazole (PROTONIX) 40 MG tablet Take 1 tablet (40 mg total) by mouth 2 (two) times daily. 180 tablet 3  . potassium chloride (KLOR-CON) 10 MEQ tablet Take 2 tablets (20 mEq total) by mouth daily. 180 tablet 3  . rosuvastatin (CRESTOR) 10 MG tablet Take 1 tablet (10 mg total) by mouth daily. 90 tablet 3  . sertraline (ZOLOFT) 100 MG tablet Take 1.5 tablets (150 mg total) by mouth daily. 132 tablet 3  . triamcinolone (NASACORT) 55 MCG/ACT AERO nasal inhaler Place 2 sprays into the nose daily. 1 Inhaler 12  . Turmeric (QC TUMERIC COMPLEX PO) Take by mouth.     . Fluticasone-Salmeterol (ADVAIR DISKUS) 250-50 MCG/DOSE AEPB Inhale 1 puff into the lungs 2 (two) times daily. 1 each 11   No facility-administered medications prior to visit.    ROS Review of Systems  Constitutional: Negative.  Negative for appetite change, diaphoresis, fatigue, fever and unexpected weight change.  HENT: Negative.   Eyes: Negative for visual disturbance.  Respiratory: Positive for shortness of breath. Negative for cough, wheezing and stridor.   Cardiovascular: Negative for chest pain, palpitations and leg swelling.  Gastrointestinal: Negative for abdominal pain, constipation, diarrhea and nausea.  Genitourinary: Negative.  Negative for difficulty urinating.  Musculoskeletal: Positive for back pain. Negative for arthralgias and myalgias.  Skin: Negative for color change and pallor.  Neurological: Negative.  Negative for dizziness, weakness, light-headedness and numbness.  Hematological: Negative for adenopathy. Does not  bruise/bleed easily.  Psychiatric/Behavioral: Negative.     Objective:  BP (!) 146/98   Pulse 73   Temp 98.4 F (36.9 C) (Oral)   Resp 16   Ht 5\' 2"  (1.575 m)   Wt 181 lb (82.1 kg)   LMP 10/01/2016   SpO2 98%   BMI 33.11 kg/m   BP Readings from Last 3 Encounters:  06/24/20 (!) 146/98  05/19/20 130/80  01/11/20 132/90    Wt Readings from Last 3 Encounters:  06/24/20 181 lb (82.1 kg)  01/11/20 178 lb (80.7 kg)  12/07/19 165 lb (74.8 kg)    Physical Exam Vitals reviewed.  Constitutional:      Appearance: Normal appearance.  HENT:     Nose: Nose normal.     Mouth/Throat:     Mouth: Mucous membranes are moist.  Eyes:     General: No scleral icterus.    Conjunctiva/sclera: Conjunctivae normal.  Cardiovascular:     Rate and Rhythm: Normal rate and regular rhythm.     Pulses: Normal pulses.     Heart sounds: No murmur heard. No gallop.      Comments: EKG- NSR, 68 bpm No LVH Normal EKG Pulmonary:     Effort: Pulmonary effort is normal.     Breath sounds: No stridor. No wheezing, rhonchi or rales.  Abdominal:     General: Abdomen is protuberant. Bowel sounds are normal. There is no distension.     Palpations: Abdomen is soft. There is no hepatomegaly, splenomegaly or mass.     Tenderness: There is no abdominal tenderness.  Musculoskeletal:     Cervical back: Normal and neck supple.     Thoracic back: Normal.     Lumbar back: No deformity, signs of trauma, tenderness or bony tenderness. Normal range of motion. Negative right straight leg raise test and negative left straight leg raise test.     Right lower leg: No edema.     Left lower leg: No edema.  Lymphadenopathy:     Cervical: No cervical adenopathy.  Neurological:     Mental Status: She is alert.     Cranial Nerves: Cranial nerves are intact.     Sensory: Sensation is intact. No sensory deficit.     Motor: Motor function is intact. No weakness.     Coordination: Coordination is intact.     Deep Tendon  Reflexes: Reflexes normal.     Reflex Scores:      Tricep reflexes are 1+ on the right side and 1+ on the left side.      Bicep reflexes are 1+ on the right side and 1+ on the left side.      Brachioradialis reflexes are 1+ on the right side and 1+ on the left side.      Patellar reflexes are 1+ on the right side and 1+ on the left side.      Achilles reflexes are 1+ on the right side and 1+ on the left side.    Lab Results  Component Value Date  WBC 7.6 06/25/2020   HGB 13.1 06/25/2020   HCT 39.9 06/25/2020   PLT 228.0 06/25/2020   GLUCOSE 98 12/07/2019   CHOL 238 (H) 12/07/2019   TRIG 94.0 12/07/2019   HDL 49.90 12/07/2019   LDLDIRECT 156.0 04/17/2013   LDLCALC 170 (H) 12/07/2019   ALT 7 12/07/2019   AST 14 12/07/2019   NA 137 12/07/2019   K 3.7 12/07/2019   CL 103 12/07/2019   CREATININE 0.84 12/07/2019   BUN 11 12/07/2019   CO2 30 12/07/2019   TSH 0.86 12/07/2019    MR BRAIN W WO CONTRAST  Result Date: 02/08/2020 CLINICAL DATA:  Pituitary lesion follow-up EXAM: MRI HEAD WITHOUT AND WITH CONTRAST TECHNIQUE: Multiplanar, multiecho pulse sequences of the brain and surrounding structures were obtained without and with intravenous contrast. CONTRAST:  37mL MULTIHANCE GADOBENATE DIMEGLUMINE 529 MG/ML IV SOLN COMPARISON:  Cervical spine MRI 01/09/2020 FINDINGS: BRAIN: No acute infarct, acute hemorrhage or extra-axial collection. Normal white matter signal. Normal volume of CSF spaces. No chronic microhemorrhage. 4 mm cyst in the posterior aspect of the pituitary gland. VASCULAR: Major flow voids are preserved. SKULL AND UPPER CERVICAL SPINE: Normal calvarium and skull base. Visualized upper cervical spine and soft tissues are normal. SINUSES/ORBITS: No paranasal sinus fluid levels or advanced mucosal thickening. No mastoid or middle ear effusion. Normal orbits. IMPRESSION: 1. Normal brain. 2. 4 mm Rathke's cleft cyst. Electronically Signed   By: Ulyses Jarred M.D.   On: 02/08/2020  02:39    Assessment & Plan:   Parneet was seen today for back pain and hypertension.  Diagnoses and all orders for this visit:  Primary hypertension- Her blood pressure is not adequately well controlled.  Her EKG is negative for LVH.  She agrees to improve her lifestyle modifications and to be more compliant with her antihypertensives. -     EKG 12-Lead -     CBC with Differential/Platelet; Future  DOE (dyspnea on exertion)- She has DOE but no other symptoms of ischemia.  Her EKG is normal.  Her troponin and BNP are normal.  I do not think the DOE is related to cardiac causes - It is most likely related to asthma and poor conditioning. -     EKG 12-Lead -     CBC with Differential/Platelet; Future -     Troponin I (High Sensitivity); Future -     Brain natriuretic peptide; Future  Mild persistent asthma without complication -     Fluticasone-Salmeterol (ADVAIR DISKUS) 250-50 MCG/DOSE AEPB; Inhale 1 puff into the lungs 2 (two) times daily.  Acute bilateral low back pain with bilateral sciatica -     meloxicam (MOBIC) 15 MG tablet; Take 1 tablet (15 mg total) by mouth daily. -     tizanidine (ZANAFLEX) 2 MG capsule; Take 1 capsule (2 mg total) by mouth 3 (three) times daily as needed for muscle spasms.   I am having Katlin Bortner. Stillinger "[name]" start on meloxicam and tizanidine. I am also having her maintain her Fluocinolone Acetonide Body, amLODipine, hydrochlorothiazide, linaclotide, losartan, potassium chloride, famotidine, pantoprazole, Turmeric (QC TUMERIC COMPLEX PO), Coenzyme Q10 (COQ10 PO), meclizine, sertraline, fexofenadine, triamcinolone, rosuvastatin, clonazePAM, ondansetron, and Fluticasone-Salmeterol.  Meds ordered this encounter  Medications  . Fluticasone-Salmeterol (ADVAIR DISKUS) 250-50 MCG/DOSE AEPB    Sig: Inhale 1 puff into the lungs 2 (two) times daily.    Dispense:  3 each    Refill:  1  . meloxicam (MOBIC) 15 MG tablet    Sig: Take  1 tablet (15 mg total) by mouth  daily.    Dispense:  30 tablet    Refill:  0  . tizanidine (ZANAFLEX) 2 MG capsule    Sig: Take 1 capsule (2 mg total) by mouth 3 (three) times daily as needed for muscle spasms.    Dispense:  90 capsule    Refill:  1     Follow-up: Return in about 3 weeks (around 07/15/2020).  Scarlette Calico, MD

## 2020-06-25 ENCOUNTER — Other Ambulatory Visit (INDEPENDENT_AMBULATORY_CARE_PROVIDER_SITE_OTHER): Payer: BC Managed Care – PPO

## 2020-06-25 ENCOUNTER — Telehealth: Payer: Self-pay | Admitting: Internal Medicine

## 2020-06-25 DIAGNOSIS — I10 Essential (primary) hypertension: Secondary | ICD-10-CM

## 2020-06-25 DIAGNOSIS — R0609 Other forms of dyspnea: Secondary | ICD-10-CM

## 2020-06-25 DIAGNOSIS — R0600 Dyspnea, unspecified: Secondary | ICD-10-CM | POA: Diagnosis not present

## 2020-06-25 LAB — CBC WITH DIFFERENTIAL/PLATELET
Basophils Absolute: 0 10*3/uL (ref 0.0–0.1)
Basophils Relative: 0.2 % (ref 0.0–3.0)
Eosinophils Absolute: 0 10*3/uL (ref 0.0–0.7)
Eosinophils Relative: 0.1 % (ref 0.0–5.0)
HCT: 39.9 % (ref 36.0–46.0)
Hemoglobin: 13.1 g/dL (ref 12.0–15.0)
Lymphocytes Relative: 23.7 % (ref 12.0–46.0)
Lymphs Abs: 1.8 10*3/uL (ref 0.7–4.0)
MCHC: 32.8 g/dL (ref 30.0–36.0)
MCV: 90.1 fl (ref 78.0–100.0)
Monocytes Absolute: 0.7 10*3/uL (ref 0.1–1.0)
Monocytes Relative: 9.3 % (ref 3.0–12.0)
Neutro Abs: 5.1 10*3/uL (ref 1.4–7.7)
Neutrophils Relative %: 66.7 % (ref 43.0–77.0)
Platelets: 228 10*3/uL (ref 150.0–400.0)
RBC: 4.43 Mil/uL (ref 3.87–5.11)
RDW: 14.8 % (ref 11.5–15.5)
WBC: 7.6 10*3/uL (ref 4.0–10.5)

## 2020-06-25 LAB — BRAIN NATRIURETIC PEPTIDE: Pro B Natriuretic peptide (BNP): 26 pg/mL (ref 0.0–100.0)

## 2020-06-25 LAB — TROPONIN I (HIGH SENSITIVITY): High Sens Troponin I: 3 ng/L (ref 2–17)

## 2020-06-25 NOTE — Telephone Encounter (Signed)
Bee for letter Verdis Frederickson can you do?

## 2020-06-25 NOTE — Telephone Encounter (Signed)
    Patient requesting work note written on 12/7 be extended to return to work on 12/9 instead. She states she wasn't able to get labs or prescriptions until today

## 2020-06-25 NOTE — Telephone Encounter (Signed)
Sent to Dr. John. 

## 2020-06-27 ENCOUNTER — Encounter: Payer: Self-pay | Admitting: Internal Medicine

## 2020-06-30 ENCOUNTER — Encounter: Payer: Self-pay | Admitting: Internal Medicine

## 2020-07-02 ENCOUNTER — Telehealth: Payer: Self-pay | Admitting: Internal Medicine

## 2020-07-02 NOTE — Telephone Encounter (Signed)
° °  Patient has concerns the medications are not helping control blood pressure Last BP 155/97 with some dizziness Appointment scheduled for 12/20  Please advise

## 2020-07-02 NOTE — Telephone Encounter (Signed)
Sent to Dr. John. 

## 2020-07-03 NOTE — Telephone Encounter (Signed)
Ok sounds good

## 2020-07-07 ENCOUNTER — Other Ambulatory Visit: Payer: Self-pay

## 2020-07-07 ENCOUNTER — Encounter: Payer: Self-pay | Admitting: Internal Medicine

## 2020-07-07 ENCOUNTER — Ambulatory Visit (INDEPENDENT_AMBULATORY_CARE_PROVIDER_SITE_OTHER): Payer: BC Managed Care – PPO | Admitting: Internal Medicine

## 2020-07-07 VITALS — BP 150/102 | HR 83 | Temp 97.8°F | Ht 62.0 in | Wt 180.0 lb

## 2020-07-07 DIAGNOSIS — M542 Cervicalgia: Secondary | ICD-10-CM

## 2020-07-07 DIAGNOSIS — E7849 Other hyperlipidemia: Secondary | ICD-10-CM | POA: Diagnosis not present

## 2020-07-07 DIAGNOSIS — J4520 Mild intermittent asthma, uncomplicated: Secondary | ICD-10-CM | POA: Diagnosis not present

## 2020-07-07 DIAGNOSIS — I10 Essential (primary) hypertension: Secondary | ICD-10-CM

## 2020-07-07 DIAGNOSIS — F411 Generalized anxiety disorder: Secondary | ICD-10-CM

## 2020-07-07 DIAGNOSIS — K219 Gastro-esophageal reflux disease without esophagitis: Secondary | ICD-10-CM

## 2020-07-07 MED ORDER — AMLODIPINE BESYLATE 5 MG PO TABS
5.0000 mg | ORAL_TABLET | Freq: Every day | ORAL | 3 refills | Status: DC
Start: 2020-07-07 — End: 2021-04-10

## 2020-07-07 MED ORDER — SERTRALINE HCL 100 MG PO TABS: 200.0000 mg | ORAL_TABLET | Freq: Every day | ORAL | 3 refills | Status: DC

## 2020-07-07 MED ORDER — HYDROCHLOROTHIAZIDE 12.5 MG PO CAPS
12.5000 mg | ORAL_CAPSULE | Freq: Every day | ORAL | 3 refills | Status: DC
Start: 2020-07-07 — End: 2021-04-10

## 2020-07-07 MED ORDER — LINACLOTIDE 145 MCG PO CAPS
145.0000 ug | ORAL_CAPSULE | Freq: Every day | ORAL | 1 refills | Status: DC
Start: 2020-07-07 — End: 2021-12-02

## 2020-07-07 MED ORDER — POTASSIUM CHLORIDE ER 10 MEQ PO TBCR
20.0000 meq | EXTENDED_RELEASE_TABLET | Freq: Every day | ORAL | 3 refills | Status: DC
Start: 2020-07-07 — End: 2021-11-26

## 2020-07-07 MED ORDER — FAMOTIDINE 20 MG PO TABS: 20.0000 mg | ORAL_TABLET | Freq: Two times a day (BID) | ORAL | 3 refills | Status: AC

## 2020-07-07 MED ORDER — OLMESARTAN MEDOXOMIL 40 MG PO TABS: 40.0000 mg | ORAL_TABLET | Freq: Every day | ORAL | 3 refills | Status: DC

## 2020-07-07 MED ORDER — PANTOPRAZOLE SODIUM 40 MG PO TBEC
40.0000 mg | DELAYED_RELEASE_TABLET | Freq: Two times a day (BID) | ORAL | 3 refills | Status: DC
Start: 2020-07-07 — End: 2021-04-10

## 2020-07-07 NOTE — Progress Notes (Deleted)
   Subjective:    Patient ID: [name], female    DOB: 02-Jan-1966, 54 y.o.   MRN: [id number]  HPI  Recent dizziness, stress over work, on going pain. Restarted the crestor, and inhaler helps dyspnea from last vist.   BP at home similar. BP Readings from Last 3 Encounters:  07/07/20 (!) 150/102  06/24/20 (!) 146/98  05/19/20 130/80     Review of Systems     Objective:   Physical Exam    .    Assessment & Plan:

## 2020-07-07 NOTE — Patient Instructions (Addendum)
Ok to stop the losartan  Please take all new medication as prescribed - the generic for benicar 40 mg per day  Remember to continue to check your BP at home on a regular basis, with the goal being to be at least overall less than 140/90  Ok to increase the zoloft to 2 tab by mouth per day  Please continue all other medications as before, and refills have been done if requested.  Please have the pharmacy call with any other refills you may need.  Please continue your efforts at being more active, low cholesterol diet, and weight control.  Please keep your appointments with your specialists as you may have planned  Please make an Appointment to return in 3 months

## 2020-07-15 ENCOUNTER — Ambulatory Visit: Payer: BC Managed Care – PPO | Admitting: Internal Medicine

## 2020-07-16 ENCOUNTER — Encounter: Payer: Self-pay | Admitting: Internal Medicine

## 2020-07-16 NOTE — Assessment & Plan Note (Signed)
Uncontrolled, for change losartan to benicar 40 qd, fu bp at home and next visit

## 2020-07-16 NOTE — Assessment & Plan Note (Signed)
stable overall by history and exam, recent data reviewed with pt, and pt to continue medical treatment as before,  to f/u any worsening symptoms or concerns  

## 2020-07-16 NOTE — Assessment & Plan Note (Signed)
Lab Results  Component Value Date   LDLCALC 170 (H) 12/07/2019  tolerating crestor 10 mg, cont low chol diet, for f/u lab next vist

## 2020-07-16 NOTE — Progress Notes (Signed)
Established Patient Office Visit  Subjective:  Patient ID: [name], female    DOB: 1966-05-19  Age: 54 y.o. MRN: [id number]      Chief Complaint: (concise statement describing the symptom, problem, condition, diagnosis, physician recommended return, or other factor as reason for encounter) follow up HTN, HLD, anxiety, gerd, asthma       HPI:  [name] is a 54 y.o. female here to f/u; overall doing ok,  Pt denies chest pain, increasing sob or doe, wheezing, orthopnea, PND, increased LE swelling, palpitations, or syncope.  Pt denies new neurological symptoms such as new headache, or facial or extremity weakness or numbness.  Pt denies polydipsia, polyuria, or symptomatic low sugars. Pt states overall good compliance with meds, mostly trying to follow appropriate diet, with wt overall stable,  but little exercise however. Denies worsening reflux, abd pain, dysphagia, n/v, bowel change or blood.   Wt Readings from Last 3 Encounters:  07/07/20 180 lb (81.6 kg)  06/24/20 181 lb (82.1 kg)  01/11/20 178 lb (80.7 kg)   C/o increased recent dizziness, stressed over work, has ongoing pain. Restarted the crestor, and inhaler helps dyspnea from last vist.  Unfortunately, BP at home similar to today. BP Readings from Last 3 Encounters:  07/07/20 (!) 150/102  06/24/20 (!) 146/98  05/19/20 130/80   Past Medical History:  Diagnosis Date  . Allergic rhinitis   . Allergy   . Anxiety   . Depression   . GERD (gastroesophageal reflux disease)   . Hyperlipidemia   . Hyperplastic colon polyp 10/2003  . Hypertension   . Menorrhagia   . Obesity   . PONV (postoperative nausea and vomiting)   . RLS (restless legs syndrome)    Past Surgical History:  Procedure Laterality Date  . COLONOSCOPY    . CYSTOSCOPY N/A 10/12/2016   Procedure: CYSTOSCOPY;  Surgeon: Dara Lords, MD;  Location: [facility];  Service: Gynecology;  Laterality: N/A;  . ENDOMETRIAL ABLATION  2007   Dr[facility] Richardson Dopp at  [facility]  . FOOT SURGERY    . LAPAROSCOPIC VAGINAL HYSTERECTOMY WITH SALPINGECTOMY Bilateral 10/12/2016   Procedure: LAPAROSCOPIC ASSISTED VAGINAL HYSTERECTOMY WITH SALPINGECTOMY;  Surgeon: Dara Lords, MD;  Location: [facility];  Service: Gynecology;  Laterality: Bilateral;  request to follow first case at around 10:30am.  Requests 2 1/2 hours for the case.  Marland Kitchen SALIVARY GLAND SURGERY  12-11   LEFT SIDE NODULE REMOVED WAS BENIGN  . TUBAL LIGATION      reports that she has never smoked. She has never used smokeless tobacco. She reports previous alcohol use. She reports that she does not use drugs. family history includes Cancer in her mother; Colon cancer in her mother; Colon polyps in her father; Heart attack in her maternal grandfather; Hyperlipidemia in an other family member; Hypertension in an other family member. Allergies  Allergen Reactions  . Other     POLLEN AND DUST  . Sulfur Hives   Current Outpatient Medications on File Prior to Visit  Medication Sig Dispense Refill  . clonazePAM (KLONOPIN) 0.5 MG tablet Take 1 tablet by mouth twice daily as needed for anxiety 60 tablet 2  . Coenzyme Q10 (COQ10 PO) Take by mouth.    . fexofenadine (ALLEGRA) 180 MG tablet Take 1 tablet (180 mg total) by mouth daily. 90 tablet 2  . Fluocinolone Acetonide Body 0.01 % OIL Apply 1 application topically 2 (two) times daily as needed (flaking of scalp).   11  .  Fluticasone-Salmeterol (ADVAIR DISKUS) 250-50 MCG/DOSE AEPB Inhale 1 puff into the lungs 2 (two) times daily. 3 each 1  . meclizine (ANTIVERT) 12.5 MG tablet Take 1 tablet (12.5 mg total) by mouth 3 (three) times daily as needed for dizziness. 30 tablet 1  . ondansetron (ZOFRAN-ODT) 4 MG disintegrating tablet Take 1 tablet (4 mg total) by mouth every 8 (eight) hours as needed for nausea or vomiting. 20 tablet 0  . rosuvastatin (CRESTOR) 10 MG tablet Take 1 tablet (10 mg total) by mouth daily. 90 tablet 3  . tizanidine (ZANAFLEX) 2 MG  capsule Take 1 capsule (2 mg total) by mouth 3 (three) times daily as needed for muscle spasms. 90 capsule 1  . triamcinolone (NASACORT) 55 MCG/ACT AERO nasal inhaler Place 2 sprays into the nose daily. 1 Inhaler 12  . Turmeric (QC TUMERIC COMPLEX PO) Take by mouth.     . [DISCONTINUED] fluticasone (FLONASE) 50 MCG/ACT nasal spray 2 sprays by Nasal route daily. 16 g 2   No current facility-administered medications on file prior to visit.        ROS:  All others reviewed and negative.  Objective        PE:  BP (!) 150/102 (BP Location: Left Arm, Patient Position: Sitting, Cuff Size: Large)   Pulse 83   Temp 97.8 F (36.6 C) (Oral)   Ht 5\' 2"  (1.575 m)   Wt 180 lb (81.6 kg)   LMP 10/01/2016   SpO2 97%   BMI 32.92 kg/m                 Constitutional: Pt appears in NAD               HENT: Head: NCAT.                Right Ear: External ear normal.                 Left Ear: External ear normal.                Eyes: . Pupils are equal, round, and reactive to light. Conjunctivae and EOM are normal               Nose: without d/c or deformity               Neck: Neck supple. Gross normal ROM               Cardiovascular: Normal rate and regular rhythm.                 Pulmonary/Chest: Effort normal and breath sounds without rales or wheezing.                Abd:  Soft, NT, ND, + BS, no organomegaly               Neurological: Pt is alert. At baseline orientation, motor grossly intact               Skin: Skin is warm. No rashes, no other new lesions, LE edema - non               Psychiatric: Pt behavior is normal without agitation, mod nervous  Assessment/Plan:  [name] is a 54 y.o. Black or African American [2] female with  has a past medical history of Allergic rhinitis, Allergy, Anxiety, Depression, GERD (gastroesophageal reflux disease), Hyperlipidemia, Hyperplastic colon polyp (10/2003), Hypertension, Menorrhagia, Obesity, PONV (postoperative nausea and vomiting), and RLS  (  restless legs syndrome).   Assessment Plan  See problem oriented assessment and plan Labs reviewed for each problem: Lab Results  Component Value Date   WBC 7.6 06/25/2020   HGB 13.1 06/25/2020   HCT 39.9 06/25/2020   PLT 228.0 06/25/2020   GLUCOSE 98 12/07/2019   CHOL 238 (H) 12/07/2019   TRIG 94.0 12/07/2019   HDL 49.90 12/07/2019   LDLDIRECT 156.0 04/17/2013   LDLCALC 170 (H) 12/07/2019   ALT 7 12/07/2019   AST 14 12/07/2019   NA 137 12/07/2019   K 3.7 12/07/2019   CL 103 12/07/2019   CREATININE 0.84 12/07/2019   BUN 11 12/07/2019   CO2 30 12/07/2019   TSH 0.86 12/07/2019    Micro: none  Cardiac tracings I have personally interpreted today:  none  Pertinent Radiological findings (summarize): MRI c spine 01-10-20  IMPRESSION: 1. No interval change of the T1 hypointense, T2 hyperintense lesion within the C7 vertebral body suggestive of an atypical hemangioma. 2. Multilevel spondylosis as described above, worst at C4-5 where there is mild to moderate right and moderate left neural foraminal narrowing. No significant changes from prior MRI. 3. No significant spinal canal stenosis at any level.   I spent total 42 minutes in caring for the patient for this visit:  1) by communicating with the patient during the visit  2) by review of pertinent vital sign data, physical examination and labs as documented in the assessment and plan  3) by review of pertinent imaging - as above  4) by review of pertinent procedures - none today  5) by obtaining and reviewing separately obtained information from family/caretaker and Care Everywhere - none today  6) by ordering medications  7) by ordering tests  8) by documenting all of this clinical information in the EHR including the management of each problem noted today in assessment and plan   Health Maintenance Due  Topic Date Due  . COVID-19 Vaccine (3 - Booster for Pfizer series) 04/07/2020  . PAP SMEAR-Modifier   08/19/2020    There are no preventive care reminders to display for this patient.   Problem List Items Addressed This Visit      High   Cervicalgia    stable overall by history and exam, recent data reviewed with pt, and pt to continue medical treatment as before,  to f/u any worsening symptoms or concerns         Medium   Hyperlipidemia    Lab Results  Component Value Date   LDLCALC 170 (H) 12/07/2019  tolerating crestor 10 mg, cont low chol diet, for f/u lab next vist      Relevant Medications   amLODipine (NORVASC) 5 MG tablet   hydrochlorothiazide (MICROZIDE) 12.5 MG capsule   olmesartan (BENICAR) 40 MG tablet   GERD    stable overall by history and exam, recent data reviewed with pt, and pt to continue medical treatment as before,  to f/u any worsening symptoms or concerns       Relevant Medications   famotidine (PEPCID) 20 MG tablet   linaclotide (LINZESS) 145 MCG CAPS capsule   pantoprazole (PROTONIX) 40 MG tablet   Essential hypertension    Uncontrolled, for change losartan to benicar 40 qd, fu bp at home and next visit      Relevant Medications   amLODipine (NORVASC) 5 MG tablet   hydrochlorothiazide (MICROZIDE) 12.5 MG capsule   olmesartan (BENICAR) 40 MG tablet   Asthma    stable overall  by history and exam, recent data reviewed with pt, and pt to continue medical treatment as before,  to f/u any worsening symptoms or concerns       Anxiety state - Primary    Ok for increased zoloft 200 qd      Relevant Medications   sertraline (ZOLOFT) 100 MG tablet      Meds ordered this encounter  Medications  . amLODipine (NORVASC) 5 MG tablet    Sig: Take 1 tablet (5 mg total) by mouth daily.    Dispense:  90 tablet    Refill:  3  . famotidine (PEPCID) 20 MG tablet    Sig: Take 1 tablet (20 mg total) by mouth 2 (two) times daily.    Dispense:  180 tablet    Refill:  3  . hydrochlorothiazide (MICROZIDE) 12.5 MG capsule    Sig: Take 1 capsule (12.5 mg  total) by mouth daily.    Dispense:  90 capsule    Refill:  3  . linaclotide (LINZESS) 145 MCG CAPS capsule    Sig: Take 1 capsule (145 mcg total) by mouth daily.    Dispense:  90 capsule    Refill:  1  . pantoprazole (PROTONIX) 40 MG tablet    Sig: Take 1 tablet (40 mg total) by mouth 2 (two) times daily.    Dispense:  180 tablet    Refill:  3  . potassium chloride (KLOR-CON) 10 MEQ tablet    Sig: Take 2 tablets (20 mEq total) by mouth daily.    Dispense:  180 tablet    Refill:  3  . sertraline (ZOLOFT) 100 MG tablet    Sig: Take 2 tablets (200 mg total) by mouth daily.    Dispense:  180 tablet    Refill:  3  . olmesartan (BENICAR) 40 MG tablet    Sig: Take 1 tablet (40 mg total) by mouth daily.    Dispense:  90 tablet    Refill:  3    Follow-up: No follow-ups on file.   Oliver Barre, MD 07/16/2020 5:02 PM [facility]

## 2020-07-16 NOTE — Assessment & Plan Note (Signed)
Ok for increased zoloft 200 qd

## 2020-08-28 ENCOUNTER — Encounter: Payer: Self-pay | Admitting: Internal Medicine

## 2020-08-28 DIAGNOSIS — E7849 Other hyperlipidemia: Secondary | ICD-10-CM

## 2020-08-28 DIAGNOSIS — E559 Vitamin D deficiency, unspecified: Secondary | ICD-10-CM

## 2020-08-28 DIAGNOSIS — R739 Hyperglycemia, unspecified: Secondary | ICD-10-CM

## 2020-08-28 DIAGNOSIS — E538 Deficiency of other specified B group vitamins: Secondary | ICD-10-CM

## 2020-09-01 ENCOUNTER — Ambulatory Visit: Payer: Self-pay | Admitting: Nurse Practitioner

## 2020-10-01 ENCOUNTER — Other Ambulatory Visit: Payer: Self-pay | Admitting: *Deleted

## 2020-10-06 ENCOUNTER — Ambulatory Visit: Payer: BC Managed Care – PPO | Admitting: Internal Medicine

## 2020-10-06 ENCOUNTER — Other Ambulatory Visit (INDEPENDENT_AMBULATORY_CARE_PROVIDER_SITE_OTHER): Payer: Self-pay

## 2020-10-06 DIAGNOSIS — E559 Vitamin D deficiency, unspecified: Secondary | ICD-10-CM

## 2020-10-06 DIAGNOSIS — E538 Deficiency of other specified B group vitamins: Secondary | ICD-10-CM

## 2020-10-06 DIAGNOSIS — E7849 Other hyperlipidemia: Secondary | ICD-10-CM

## 2020-10-06 DIAGNOSIS — R739 Hyperglycemia, unspecified: Secondary | ICD-10-CM

## 2020-10-06 LAB — URINALYSIS, ROUTINE W REFLEX MICROSCOPIC
Bilirubin Urine: NEGATIVE
Hgb urine dipstick: NEGATIVE
Ketones, ur: NEGATIVE
Leukocytes,Ua: NEGATIVE
Nitrite: NEGATIVE
Specific Gravity, Urine: 1.02 (ref 1.000–1.030)
Total Protein, Urine: NEGATIVE
Urine Glucose: NEGATIVE
Urobilinogen, UA: 0.2 (ref 0.0–1.0)
pH: 6 (ref 5.0–8.0)

## 2020-10-06 LAB — BASIC METABOLIC PANEL
BUN: 12 mg/dL (ref 6–23)
CO2: 30 mEq/L (ref 19–32)
Calcium: 9.8 mg/dL (ref 8.4–10.5)
Chloride: 100 mEq/L (ref 96–112)
Creatinine, Ser: 0.85 mg/dL (ref 0.40–1.20)
GFR: 78.85 mL/min (ref >60.00)
Glucose, Bld: 95 mg/dL (ref 70–99)
Potassium: 3.4 mEq/L — ABNORMAL LOW (ref 3.5–5.1)
Sodium: 141 mEq/L (ref 135–145)

## 2020-10-06 LAB — CBC WITH DIFFERENTIAL/PLATELET
Basophils Absolute: 0 10*3/uL (ref 0.0–0.1)
Basophils Relative: 0.3 % (ref 0.0–3.0)
Eosinophils Absolute: 0 10*3/uL (ref 0.0–0.7)
Eosinophils Relative: 0.3 % (ref 0.0–5.0)
HCT: 39.2 % (ref 36.0–46.0)
Hemoglobin: 13.4 g/dL (ref 12.0–15.0)
Lymphocytes Relative: 25.9 % (ref 12.0–46.0)
Lymphs Abs: 2.3 10*3/uL (ref 0.7–4.0)
MCHC: 34.1 g/dL (ref 30.0–36.0)
MCV: 89.9 fl (ref 78.0–100.0)
Monocytes Absolute: 0.7 10*3/uL (ref 0.1–1.0)
Monocytes Relative: 7.5 % (ref 3.0–12.0)
Neutro Abs: 5.8 10*3/uL (ref 1.4–7.7)
Neutrophils Relative %: 66 % (ref 43.0–77.0)
Platelets: 233 10*3/uL (ref 150.0–400.0)
RBC: 4.36 Mil/uL (ref 3.87–5.11)
RDW: 15.1 % (ref 11.5–15.5)
WBC: 8.7 10*3/uL (ref 4.0–10.5)

## 2020-10-06 LAB — LIPID PANEL
Cholesterol: 177 mg/dL (ref 0–200)
HDL: 52.5 mg/dL (ref >39.00)
LDL Cholesterol: 103 mg/dL — ABNORMAL HIGH (ref 0–99)
NonHDL: 124.29
Total CHOL/HDL Ratio: 3
Triglycerides: 104 mg/dL (ref 0.0–149.0)
VLDL: 20.8 mg/dL (ref 0.0–40.0)

## 2020-10-06 LAB — VITAMIN B12: Vitamin B-12: 609 pg/mL (ref 211–911)

## 2020-10-06 LAB — VITAMIN D 25 HYDROXY (VIT D DEFICIENCY, FRACTURES): VITD: 32.18 ng/mL (ref 30.00–100.00)

## 2020-10-06 LAB — HEPATIC FUNCTION PANEL
ALT: 10 U/L (ref 0–35)
AST: 19 U/L (ref 0–37)
Albumin: 4.9 g/dL (ref 3.5–5.2)
Alkaline Phosphatase: 53 U/L (ref 39–117)
Bilirubin, Direct: 0 mg/dL (ref 0.0–0.3)
Total Bilirubin: 0.6 mg/dL (ref 0.2–1.2)
Total Protein: 8.1 g/dL (ref 6.0–8.3)

## 2020-10-06 LAB — HEMOGLOBIN A1C: Hgb A1c MFr Bld: 5.8 % (ref 4.6–6.5)

## 2020-10-06 LAB — TSH: TSH: 1.42 u[IU]/mL (ref 0.35–4.50)

## 2020-10-09 ENCOUNTER — Ambulatory Visit (INDEPENDENT_AMBULATORY_CARE_PROVIDER_SITE_OTHER): Payer: BC Managed Care – PPO | Admitting: Internal Medicine

## 2020-10-09 ENCOUNTER — Other Ambulatory Visit: Payer: Self-pay

## 2020-10-09 ENCOUNTER — Encounter: Payer: Self-pay | Admitting: Internal Medicine

## 2020-10-09 VITALS — BP 140/80 | HR 76 | Temp 98.8°F | Ht 62.0 in | Wt 180.0 lb

## 2020-10-09 DIAGNOSIS — I10 Essential (primary) hypertension: Secondary | ICD-10-CM

## 2020-10-09 DIAGNOSIS — E559 Vitamin D deficiency, unspecified: Secondary | ICD-10-CM

## 2020-10-09 DIAGNOSIS — F411 Generalized anxiety disorder: Secondary | ICD-10-CM | POA: Diagnosis not present

## 2020-10-09 DIAGNOSIS — R739 Hyperglycemia, unspecified: Secondary | ICD-10-CM | POA: Diagnosis not present

## 2020-10-09 DIAGNOSIS — E538 Deficiency of other specified B group vitamins: Secondary | ICD-10-CM

## 2020-10-09 DIAGNOSIS — I872 Venous insufficiency (chronic) (peripheral): Secondary | ICD-10-CM

## 2020-10-09 DIAGNOSIS — E7800 Pure hypercholesterolemia, unspecified: Secondary | ICD-10-CM | POA: Diagnosis not present

## 2020-10-09 NOTE — Patient Instructions (Signed)
Please continue all other medications as before, and refills have been done if requested.  Please have the pharmacy call with any other refills you may need.  Please continue your efforts at being more active, low cholesterol diet, and weight control..  Please keep your appointments with your specialists as you may have planned  Please make an Appointment to return in 6 months, or sooner if needed, also with Lab Appointment for testing done 3-5 days before at the [facility] (so this is for TWO appointments - please see the scheduling desk as you leave)  Due to the ongoing Covid 19 pandemic, our lab now requires an appointment for any labs done at our office.  If you need labs done and do not have an appointment, please call our office ahead of time to schedule before presenting to the lab for your testing.

## 2020-10-09 NOTE — Progress Notes (Signed)
Patient ID: [name], female   DOB: 28-Jan-1966, 55 y.o.   MRN: [id number]        Chief Complaint: follow up HTN, HLD and hyperglycemia , leg swelling       HPI:  [name] is a 55 y.o. female here with intermittent mild distal leg swelling for several months, worse to keep the legs dependent during day, worse by nightfall, better in the AM, and Pt denies chest pain, increased sob or doe, wheezing, orthopnea, PND, palpitations, dizziness or syncope.  States bp seems to be controlled at home < 140/90.  Denies new worsening focal neuro s/s.  Pt denies polydipsia, polyuria,  Pt denies fever, wt loss, night sweats, loss of appetite, or other constitutional symptoms  Has no other new complaints.  Denies worsening depressive symptoms, suicidal ideation, or panic         Wt Readings from Last 3 Encounters:  10/09/20 180 lb (81.6 kg)  07/07/20 180 lb (81.6 kg)  06/24/20 181 lb (82.1 kg)   BP Readings from Last 3 Encounters:  10/09/20 140/80  07/07/20 (!) 150/102  06/24/20 (!) 146/98         Past Medical History:  Diagnosis Date  . Allergic rhinitis   . Allergy   . Anxiety   . Depression   . GERD (gastroesophageal reflux disease)   . Hyperlipidemia   . Hyperplastic colon polyp 10/2003  . Hypertension   . Menorrhagia   . Obesity   . PONV (postoperative nausea and vomiting)   . RLS (restless legs syndrome)    Past Surgical History:  Procedure Laterality Date  . COLONOSCOPY    . CYSTOSCOPY N/A 10/12/2016   Procedure: CYSTOSCOPY;  Surgeon: Anastasio Auerbach, MD;  Location: [facility];  Service: Gynecology;  Laterality: N/A;  . ENDOMETRIAL ABLATION  2007   Dr. Landry Mellow at [facility]  . FOOT SURGERY    . LAPAROSCOPIC VAGINAL HYSTERECTOMY WITH SALPINGECTOMY Bilateral 10/12/2016   Procedure: LAPAROSCOPIC ASSISTED VAGINAL HYSTERECTOMY WITH SALPINGECTOMY;  Surgeon: Anastasio Auerbach, MD;  Location: [facility];  Service: Gynecology;  Laterality: Bilateral;  request to follow first case  at around 10:30am.  Requests 2 1/2 hours for the case.  Marland Kitchen SALIVARY GLAND SURGERY  12-11   LEFT SIDE NODULE REMOVED WAS BENIGN  . TUBAL LIGATION      reports that she has never smoked. She has never used smokeless tobacco. She reports previous alcohol use. She reports that she does not use drugs. family history includes Cancer in her mother; Colon cancer in her mother; Colon polyps in her father; Heart attack in her maternal grandfather; Hyperlipidemia in an other family member; Hypertension in an other family member. Allergies  Allergen Reactions  . Elemental Sulfur Hives  . Other     POLLEN AND DUST   Current Outpatient Medications on File Prior to Visit  Medication Sig Dispense Refill  . amLODipine (NORVASC) 5 MG tablet Take 1 tablet (5 mg total) by mouth daily. 90 tablet 3  . clonazePAM (KLONOPIN) 0.5 MG tablet Take 1 tablet by mouth twice daily as needed for anxiety 60 tablet 2  . Coenzyme Q10 (COQ10 PO) Take by mouth.    . famotidine (PEPCID) 20 MG tablet Take 1 tablet (20 mg total) by mouth 2 (two) times daily. 180 tablet 3  . fexofenadine (ALLEGRA) 180 MG tablet Take 1 tablet (180 mg total) by mouth daily. 90 tablet 2  . Fluocinolone Acetonide Body 0.01 % OIL Apply 1  application topically 2 (two) times daily as needed (flaking of scalp).   11  . Fluticasone-Salmeterol (ADVAIR DISKUS) 250-50 MCG/DOSE AEPB Inhale 1 puff into the lungs 2 (two) times daily. 3 each 1  . hydrochlorothiazide (MICROZIDE) 12.5 MG capsule Take 1 capsule (12.5 mg total) by mouth daily. 90 capsule 3  . linaclotide (LINZESS) 145 MCG CAPS capsule Take 1 capsule (145 mcg total) by mouth daily. 90 capsule 1  . olmesartan (BENICAR) 40 MG tablet Take 1 tablet (40 mg total) by mouth daily. 90 tablet 3  . ondansetron (ZOFRAN-ODT) 4 MG disintegrating tablet Take 1 tablet (4 mg total) by mouth every 8 (eight) hours as needed for nausea or vomiting. 20 tablet 0  . pantoprazole (PROTONIX) 40 MG tablet Take 1 tablet (40 mg  total) by mouth 2 (two) times daily. 180 tablet 3  . potassium chloride (KLOR-CON) 10 MEQ tablet Take 2 tablets (20 mEq total) by mouth daily. 180 tablet 3  . rosuvastatin (CRESTOR) 10 MG tablet Take 1 tablet (10 mg total) by mouth daily. 90 tablet 3  . sertraline (ZOLOFT) 100 MG tablet Take 2 tablets (200 mg total) by mouth daily. 180 tablet 3  . tizanidine (ZANAFLEX) 2 MG capsule Take 1 capsule (2 mg total) by mouth 3 (three) times daily as needed for muscle spasms. 90 capsule 1  . triamcinolone (NASACORT) 55 MCG/ACT AERO nasal inhaler Place 2 sprays into the nose daily. 1 Inhaler 12  . Turmeric (QC TUMERIC COMPLEX PO) Take by mouth.     . [DISCONTINUED] fluticasone (FLONASE) 50 MCG/ACT nasal spray 2 sprays by Nasal route daily. 16 g 2   No current facility-administered medications on file prior to visit.        ROS:  All others reviewed and negative.  Objective        PE:  BP 140/80   Pulse 76   Temp 98.8 F (37.1 C) (Oral)   Ht 5\' 2"  (1.575 m)   Wt 180 lb (81.6 kg)   LMP 10/01/2016   SpO2 98%   BMI 32.92 kg/m                 Constitutional: Pt appears in NAD               HENT: Head: NCAT.                Right Ear: External ear normal.                 Left Ear: External ear normal.                Eyes: . Pupils are equal, round, and reactive to light. Conjunctivae and EOM are normal               Nose: without d/c or deformity               Neck: Neck supple. Gross normal ROM               Cardiovascular: Normal rate and regular rhythm.                 Pulmonary/Chest: Effort normal and breath sounds without rales or wheezing.                Abd:  Soft, NT, ND, + BS, no organomegaly               Neurological: Pt is alert. At baseline orientation, motor grossly intact  Skin: Skin is warm. No rashes, no other new lesions, LE edema - trace               Psychiatric: Pt behavior is normal without agitation   Micro: none  Cardiac tracings I have personally  interpreted today:  none  Pertinent Radiological findings (summarize): none   Lab Results  Component Value Date   WBC 8.7 10/06/2020   HGB 13.4 10/06/2020   HCT 39.2 10/06/2020   PLT 233.0 10/06/2020   GLUCOSE 95 10/06/2020   CHOL 177 10/06/2020   TRIG 104.0 10/06/2020   HDL 52.50 10/06/2020   LDLDIRECT 156.0 04/17/2013   LDLCALC 103 (H) 10/06/2020   ALT 10 10/06/2020   AST 19 10/06/2020   NA 141 10/06/2020   K 3.4 (L) 10/06/2020   CL 100 10/06/2020   CREATININE 0.85 10/06/2020   BUN 12 10/06/2020   CO2 30 10/06/2020   TSH 1.42 10/06/2020   HGBA1C 5.8 10/06/2020   Assessment/Plan:  [name] is a 54 y.o. Black or African American [2] female with  has a past medical history of Allergic rhinitis, Allergy, Anxiety, Depression, GERD (gastroesophageal reflux disease), Hyperlipidemia, Hyperplastic colon polyp (10/2003), Hypertension, Menorrhagia, Obesity, PONV (postoperative nausea and vomiting), and RLS (restless legs syndrome).  Primary hypertension BP Readings from Last 3 Encounters:  10/09/20 140/80  07/07/20 (!) 150/102  06/24/20 (!) 146/98   Stable, pt to continue medical treatment amlodipine, benicar, hct   Hyperlipidemia Lab Results  Component Value Date   LDLCALC 103 (H) 10/06/2020   Stable, pt to continue current statin crestor 10, declines increase with goal ldl < 100   Current Outpatient Medications (Cardiovascular):  .  amLODipine (NORVASC) 5 MG tablet, Take 1 tablet (5 mg total) by mouth daily. .  hydrochlorothiazide (MICROZIDE) 12.5 MG capsule, Take 1 capsule (12.5 mg total) by mouth daily. Marland Kitchen  olmesartan (BENICAR) 40 MG tablet, Take 1 tablet (40 mg total) by mouth daily. .  rosuvastatin (CRESTOR) 10 MG tablet, Take 1 tablet (10 mg total) by mouth daily.  Current Outpatient Medications (Respiratory):  .  fexofenadine (ALLEGRA) 180 MG tablet, Take 1 tablet (180 mg total) by mouth daily. .  Fluticasone-Salmeterol (ADVAIR DISKUS) 250-50 MCG/DOSE  AEPB, Inhale 1 puff into the lungs 2 (two) times daily. Marland Kitchen  triamcinolone (NASACORT) 55 MCG/ACT AERO nasal inhaler, Place 2 sprays into the nose daily.    Current Outpatient Medications (Other):  .  clonazePAM (KLONOPIN) 0.5 MG tablet, Take 1 tablet by mouth twice daily as needed for anxiety .  Coenzyme Q10 (COQ10 PO), Take by mouth. .  famotidine (PEPCID) 20 MG tablet, Take 1 tablet (20 mg total) by mouth 2 (two) times daily. .  Fluocinolone Acetonide Body 0.01 % OIL, Apply 1 application topically 2 (two) times daily as needed (flaking of scalp).  Marland Kitchen  linaclotide (LINZESS) 145 MCG CAPS capsule, Take 1 capsule (145 mcg total) by mouth daily. .  ondansetron (ZOFRAN-ODT) 4 MG disintegrating tablet, Take 1 tablet (4 mg total) by mouth every 8 (eight) hours as needed for nausea or vomiting. .  pantoprazole (PROTONIX) 40 MG tablet, Take 1 tablet (40 mg total) by mouth 2 (two) times daily. .  potassium chloride (KLOR-CON) 10 MEQ tablet, Take 2 tablets (20 mEq total) by mouth daily. .  sertraline (ZOLOFT) 100 MG tablet, Take 2 tablets (200 mg total) by mouth daily. .  tizanidine (ZANAFLEX) 2 MG capsule, Take 1 capsule (2 mg total) by mouth 3 (three) times daily as  needed for muscle spasms. .  Turmeric (QC TUMERIC COMPLEX PO), Take by mouth.    Anxiety state Stable overall, cont current med tx - sertraline, clonozapem  Hyperglycemia Lab Results  Component Value Date   HGBA1C 5.8 10/06/2020   Stable, pt to continue current medical treatment  - diet, wt control   Venous insufficiency Mild for compression stockings during daytime  Followup: Return in about 6 months (around 04/11/2021).  [name], MD 10/12/2020 12:09 PM [facility]

## 2020-10-12 ENCOUNTER — Encounter: Payer: Self-pay | Admitting: Internal Medicine

## 2020-10-12 DIAGNOSIS — R7303 Prediabetes: Secondary | ICD-10-CM | POA: Insufficient documentation

## 2020-10-12 DIAGNOSIS — R739 Hyperglycemia, unspecified: Secondary | ICD-10-CM | POA: Insufficient documentation

## 2020-10-12 DIAGNOSIS — I872 Venous insufficiency (chronic) (peripheral): Secondary | ICD-10-CM | POA: Insufficient documentation

## 2020-10-12 NOTE — Assessment & Plan Note (Signed)
Lab Results  Component Value Date   HGBA1C 5.8 10/06/2020   Stable, pt to continue current medical treatment  - diet, wt control

## 2020-10-12 NOTE — Assessment & Plan Note (Signed)
Lab Results  Component Value Date   LDLCALC 103 (H) 10/06/2020   Stable, pt to continue current statin crestor 10, declines increase with goal ldl < 100   Current Outpatient Medications (Cardiovascular):  .  amLODipine (NORVASC) 5 MG tablet, Take 1 tablet (5 mg total) by mouth daily. .  hydrochlorothiazide (MICROZIDE) 12.5 MG capsule, Take 1 capsule (12.5 mg total) by mouth daily. Marland Kitchen  olmesartan (BENICAR) 40 MG tablet, Take 1 tablet (40 mg total) by mouth daily. .  rosuvastatin (CRESTOR) 10 MG tablet, Take 1 tablet (10 mg total) by mouth daily.  Current Outpatient Medications (Respiratory):  .  fexofenadine (ALLEGRA) 180 MG tablet, Take 1 tablet (180 mg total) by mouth daily. .  Fluticasone-Salmeterol (ADVAIR DISKUS) 250-50 MCG/DOSE AEPB, Inhale 1 puff into the lungs 2 (two) times daily. Marland Kitchen  triamcinolone (NASACORT) 55 MCG/ACT AERO nasal inhaler, Place 2 sprays into the nose daily.    Current Outpatient Medications (Other):  .  clonazePAM (KLONOPIN) 0.5 MG tablet, Take 1 tablet by mouth twice daily as needed for anxiety .  Coenzyme Q10 (COQ10 PO), Take by mouth. .  famotidine (PEPCID) 20 MG tablet, Take 1 tablet (20 mg total) by mouth 2 (two) times daily. .  Fluocinolone Acetonide Body 0.01 % OIL, Apply 1 application topically 2 (two) times daily as needed (flaking of scalp).  Marland Kitchen  linaclotide (LINZESS) 145 MCG CAPS capsule, Take 1 capsule (145 mcg total) by mouth daily. .  ondansetron (ZOFRAN-ODT) 4 MG disintegrating tablet, Take 1 tablet (4 mg total) by mouth every 8 (eight) hours as needed for nausea or vomiting. .  pantoprazole (PROTONIX) 40 MG tablet, Take 1 tablet (40 mg total) by mouth 2 (two) times daily. .  potassium chloride (KLOR-CON) 10 MEQ tablet, Take 2 tablets (20 mEq total) by mouth daily. .  sertraline (ZOLOFT) 100 MG tablet, Take 2 tablets (200 mg total) by mouth daily. .  tizanidine (ZANAFLEX) 2 MG capsule, Take 1 capsule (2 mg total) by mouth 3 (three) times daily as  needed for muscle spasms. .  Turmeric (QC TUMERIC COMPLEX PO), Take by mouth.

## 2020-10-12 NOTE — Assessment & Plan Note (Signed)
Mild for compression stockings during daytime

## 2020-10-12 NOTE — Assessment & Plan Note (Signed)
Stable overall, cont current med tx - sertraline, clonozapem

## 2020-10-12 NOTE — Assessment & Plan Note (Signed)
BP Readings from Last 3 Encounters:  10/09/20 140/80  07/07/20 (!) 150/102  06/24/20 (!) 146/98   Stable, pt to continue medical treatment amlodipine, benicar, hct

## 2020-11-22 ENCOUNTER — Encounter: Payer: Self-pay | Admitting: Internal Medicine

## 2020-11-24 MED ORDER — METOPROLOL SUCCINATE ER 50 MG PO TB24
50.0000 mg | ORAL_TABLET | Freq: Every day | ORAL | 3 refills | Status: DC
Start: 2020-11-24 — End: 2021-04-10

## 2020-11-26 ENCOUNTER — Other Ambulatory Visit: Payer: Self-pay | Admitting: Internal Medicine

## 2020-11-26 DIAGNOSIS — Z1231 Encounter for screening mammogram for malignant neoplasm of breast: Secondary | ICD-10-CM

## 2020-12-26 ENCOUNTER — Ambulatory Visit: Payer: BC Managed Care – PPO | Admitting: Obstetrics & Gynecology

## 2020-12-26 ENCOUNTER — Other Ambulatory Visit (HOSPITAL_COMMUNITY)
Admission: RE | Admit: 2020-12-26 | Discharge: 2020-12-26 | Disposition: A | Discharge status: Home / Self Care | Payer: BC Managed Care – PPO | Source: Ambulatory Visit | Attending: Obstetrics & Gynecology | Admitting: Obstetrics & Gynecology

## 2020-12-26 ENCOUNTER — Other Ambulatory Visit: Payer: Self-pay

## 2020-12-26 ENCOUNTER — Encounter: Payer: Self-pay | Admitting: Obstetrics & Gynecology

## 2020-12-26 VITALS — BP 130/84

## 2020-12-26 DIAGNOSIS — R300 Dysuria: Secondary | ICD-10-CM | POA: Diagnosis not present

## 2020-12-26 DIAGNOSIS — Z780 Asymptomatic menopausal state: Secondary | ICD-10-CM

## 2020-12-26 DIAGNOSIS — Z01419 Encounter for gynecological examination (general) (routine) without abnormal findings: Secondary | ICD-10-CM

## 2020-12-26 DIAGNOSIS — Z90710 Acquired absence of both cervix and uterus: Secondary | ICD-10-CM | POA: Diagnosis not present

## 2020-12-26 DIAGNOSIS — Z1272 Encounter for screening for malignant neoplasm of vagina: Secondary | ICD-10-CM

## 2020-12-26 MED ORDER — SULFAMETHOXAZOLE-TRIMETHOPRIM 800-160 MG PO TABS: 1.0000 | ORAL_TABLET | Freq: Two times a day (BID) | ORAL | 1 refills | Status: AC

## 2020-12-26 MED ORDER — FLUCONAZOLE 150 MG PO TABS: 150.0000 mg | ORAL_TABLET | Freq: Once | ORAL | 0 refills | Status: AC

## 2020-12-26 NOTE — Progress Notes (Signed)
[name] Apr 14, 1966 [id number]   History:    55 y.o. G2P2L2 Single  RP:  Established patient presenting for annual gyn exam   HPI: 09/2016 LAVH for fibroids and menorrhagia on no HRT with occasional hot flushes.  No pelvic pain.  Abstinent currently.  Normal Pap and mammogram history.  Dysuria.  Mother deceased from colon cancer at age 35 had a negative polyp on colonoscopy 01/2019.  Hypertension primary care manages labs and meds.  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  Declined weight measurement, obese.  Will increase physical activities and start on a diet.    Past medical history,surgical history, family history and social history were all reviewed and documented in the EPIC chart.  Gynecologic History Patient's last menstrual period was 10/01/2016.  Obstetric History OB History  Gravida Para Term Preterm AB Living  2 2       2   SAB IAB Ectopic Multiple Live Births               # Outcome Date GA Lbr Len/2nd Weight Sex Delivery Anes PTL Lv  2 Para      Vag-Spont     1 Para      Vag-Spont        ROS: A ROS was performed and pertinent positives and negatives are included in the history.  GENERAL: No fevers or chills. HEENT: No change in vision, no earache, sore throat or sinus congestion. NECK: No pain or stiffness. CARDIOVASCULAR: No chest pain or pressure. No palpitations. PULMONARY: No shortness of breath, cough or wheeze. GASTROINTESTINAL: No abdominal pain, nausea, vomiting or diarrhea, melena or bright red blood per rectum. GENITOURINARY: No urinary frequency, urgency, hesitancy or dysuria. MUSCULOSKELETAL: No joint or muscle pain, no back pain, no recent trauma. DERMATOLOGIC: No rash, no itching, no lesions. ENDOCRINE: No polyuria, polydipsia, no heat or cold intolerance. No recent change in weight. HEMATOLOGICAL: No anemia or easy bruising or bleeding. NEUROLOGIC: No headache, seizures, numbness, tingling  or weakness. PSYCHIATRIC: No depression, no loss of interest in normal activity or change in sleep pattern.     Exam:   BP 130/84   LMP 10/01/2016   There is no height or weight on file to calculate BMI.  General appearance : Well developed well nourished female. No acute distress HEENT: Eyes: no retinal hemorrhage or exudates,  Neck supple, trachea midline, no carotid bruits, no thyroidmegaly Lungs: Clear to auscultation, no rhonchi or wheezes, or rib retractions  Heart: Regular rate and rhythm, no murmurs or gallops Breast:Examined in sitting and supine position were symmetrical in appearance, no palpable masses or tenderness,  no skin retraction, no nipple inversion, no nipple discharge, no skin discoloration, no axillary or supraclavicular lymphadenopathy Abdomen: no palpable masses or tenderness, no rebound or guarding Extremities: no edema or skin discoloration or tenderness  Pelvic: Vulva: Normal             Vagina: No gross lesions or discharge.  Pap reflex done  Cervix/Uterus absent  Adnexa  Without masses or tenderness  Anus: Normal  U/A:  Cloudy, Nit Neg, Pro Neg, WBC 6-10, RBC NS, Bacteria Moderate.  Pending U. Culture.   Assessment/Plan:  55 y.o. female for annual exam   1. Encounter for Papanicolaou smear of vagina as part of routine gynecological examination Gynecologic exam status post LAVH.  Pap reflex done on the vaginal vault.  Breast exam normal.  Last screening mammogram June  2021 was negative, will schedule a screening mammogram this year.  Colonoscopy July 2020.  Health labs with family physician. - Cytology - PAP( [facility])  2. S/P laparoscopic assisted vaginal hysterectomy (LAVH)  3. Postmenopause Well on no hormone replacement therapy.  Vitamin D supplements, calcium intake of 1.5 g/day total.  4. Dysuria Irritation and pain with urination.  Urine analysis is mildly perturbed.  Decision to treat with Bactrim DS 1 tablet twice a day for 3 days.   Pending urine culture. - Urinalysis,Complete w/RFL Culture  Other orders - sulfamethoxazole-trimethoprim (BACTRIM DS) 800-160 MG tablet; Take 1 tablet by mouth 2 (two) times daily for 3 days. - fluconazole (DIFLUCAN) 150 MG tablet; Take 1 tablet (150 mg total) by mouth once for 1 dose.   Princess Bruins MD, 11:59 AM 12/26/2020

## 2020-12-28 ENCOUNTER — Encounter: Payer: Self-pay | Admitting: Obstetrics & Gynecology

## 2020-12-28 LAB — URINALYSIS, COMPLETE W/RFL CULTURE
Bilirubin Urine: NEGATIVE
Glucose, UA: NEGATIVE
Hgb urine dipstick: NEGATIVE
Hyaline Cast: NONE SEEN /LPF
Ketones, ur: NEGATIVE
Nitrites, Initial: NEGATIVE
Protein, ur: NEGATIVE
RBC / HPF: NONE SEEN /HPF (ref 0–2)
Specific Gravity, Urine: 1.025 (ref 1.001–1.035)
pH: 5.5 (ref 5.0–8.0)

## 2020-12-28 LAB — URINE CULTURE
MICRO NUMBER:: 11993776
Result:: NO GROWTH
SPECIMEN QUALITY:: ADEQUATE

## 2020-12-28 LAB — CULTURE INDICATED

## 2020-12-29 LAB — CYTOLOGY - PAP: Diagnosis: NEGATIVE

## 2020-12-31 ENCOUNTER — Other Ambulatory Visit: Payer: Self-pay | Admitting: Internal Medicine

## 2021-01-01 ENCOUNTER — Other Ambulatory Visit: Payer: Self-pay | Admitting: Obstetrics & Gynecology

## 2021-01-20 ENCOUNTER — Ambulatory Visit
Admission: RE | Admit: 2021-01-20 | Discharge: 2021-01-20 | Disposition: A | Discharge status: Home / Self Care | Payer: BC Managed Care – PPO | Source: Ambulatory Visit | Attending: Internal Medicine | Admitting: Internal Medicine

## 2021-01-20 ENCOUNTER — Other Ambulatory Visit: Payer: Self-pay

## 2021-01-20 DIAGNOSIS — Z1231 Encounter for screening mammogram for malignant neoplasm of breast: Secondary | ICD-10-CM

## 2021-01-22 ENCOUNTER — Other Ambulatory Visit: Payer: Self-pay

## 2021-01-22 MED ORDER — FLUCONAZOLE 150 MG PO TABS: 150.0000 mg | ORAL_TABLET | Freq: Once | ORAL | 0 refills | Status: AC

## 2021-02-08 ENCOUNTER — Encounter: Payer: Self-pay | Admitting: Internal Medicine

## 2021-02-09 MED ORDER — NIRMATRELVIR/RITONAVIR (PAXLOVID)TABLET: 3.0000 | ORAL_TABLET | Freq: Two times a day (BID) | ORAL | 0 refills | Status: AC

## 2021-02-10 ENCOUNTER — Other Ambulatory Visit: Payer: Self-pay | Admitting: Internal Medicine

## 2021-04-06 ENCOUNTER — Other Ambulatory Visit (INDEPENDENT_AMBULATORY_CARE_PROVIDER_SITE_OTHER): Payer: BC Managed Care – PPO

## 2021-04-06 DIAGNOSIS — R739 Hyperglycemia, unspecified: Secondary | ICD-10-CM | POA: Diagnosis not present

## 2021-04-06 DIAGNOSIS — E7800 Pure hypercholesterolemia, unspecified: Secondary | ICD-10-CM

## 2021-04-06 DIAGNOSIS — E538 Deficiency of other specified B group vitamins: Secondary | ICD-10-CM

## 2021-04-06 DIAGNOSIS — E559 Vitamin D deficiency, unspecified: Secondary | ICD-10-CM | POA: Diagnosis not present

## 2021-04-06 LAB — LIPID PANEL
Cholesterol: 154 mg/dL (ref 0–200)
HDL: 46.9 mg/dL (ref >39.00)
LDL Cholesterol: 88 mg/dL (ref 0–99)
NonHDL: 106.71
Total CHOL/HDL Ratio: 3
Triglycerides: 92 mg/dL (ref 0.0–149.0)
VLDL: 18.4 mg/dL (ref 0.0–40.0)

## 2021-04-06 LAB — BASIC METABOLIC PANEL
BUN: 11 mg/dL (ref 6–23)
CO2: 31 mEq/L (ref 19–32)
Calcium: 9.7 mg/dL (ref 8.4–10.5)
Chloride: 99 mEq/L (ref 96–112)
Creatinine, Ser: 0.84 mg/dL (ref 0.40–1.20)
GFR: 79.69 mL/min (ref >60.00)
Glucose, Bld: 94 mg/dL (ref 70–99)
Potassium: 3.7 mEq/L (ref 3.5–5.1)
Sodium: 138 mEq/L (ref 135–145)

## 2021-04-06 LAB — URINALYSIS, ROUTINE W REFLEX MICROSCOPIC
Bilirubin Urine: NEGATIVE
Hgb urine dipstick: NEGATIVE
Ketones, ur: NEGATIVE
Leukocytes,Ua: NEGATIVE
Nitrite: NEGATIVE
Specific Gravity, Urine: 1.02 (ref 1.000–1.030)
Total Protein, Urine: NEGATIVE
Urine Glucose: NEGATIVE
Urobilinogen, UA: 0.2 (ref 0.0–1.0)
pH: 6 (ref 5.0–8.0)

## 2021-04-06 LAB — TSH: TSH: 1.63 u[IU]/mL (ref 0.35–5.50)

## 2021-04-06 LAB — CBC WITH DIFFERENTIAL/PLATELET
Basophils Absolute: 0 10*3/uL (ref 0.0–0.1)
Basophils Relative: 0.3 % (ref 0.0–3.0)
Eosinophils Absolute: 0 10*3/uL (ref 0.0–0.7)
Eosinophils Relative: 0.3 % (ref 0.0–5.0)
HCT: 38.6 % (ref 36.0–46.0)
Hemoglobin: 12.7 g/dL (ref 12.0–15.0)
Lymphocytes Relative: 27.1 % (ref 12.0–46.0)
Lymphs Abs: 2 10*3/uL (ref 0.7–4.0)
MCHC: 32.8 g/dL (ref 30.0–36.0)
MCV: 90.4 fl (ref 78.0–100.0)
Monocytes Absolute: 0.6 10*3/uL (ref 0.1–1.0)
Monocytes Relative: 7.4 % (ref 3.0–12.0)
Neutro Abs: 4.8 10*3/uL (ref 1.4–7.7)
Neutrophils Relative %: 64.9 % (ref 43.0–77.0)
Platelets: 180 10*3/uL (ref 150.0–400.0)
RBC: 4.27 Mil/uL (ref 3.87–5.11)
RDW: 15.3 % (ref 11.5–15.5)
WBC: 7.4 10*3/uL (ref 4.0–10.5)

## 2021-04-06 LAB — HEPATIC FUNCTION PANEL
ALT: 11 U/L (ref 0–35)
AST: 20 U/L (ref 0–37)
Albumin: 4.5 g/dL (ref 3.5–5.2)
Alkaline Phosphatase: 54 U/L (ref 39–117)
Bilirubin, Direct: 0.1 mg/dL (ref 0.0–0.3)
Total Bilirubin: 0.5 mg/dL (ref 0.2–1.2)
Total Protein: 8 g/dL (ref 6.0–8.3)

## 2021-04-06 LAB — HEMOGLOBIN A1C: Hgb A1c MFr Bld: 5.9 % (ref 4.6–6.5)

## 2021-04-06 LAB — VITAMIN B12: Vitamin B-12: 477 pg/mL (ref 211–911)

## 2021-04-06 LAB — VITAMIN D 25 HYDROXY (VIT D DEFICIENCY, FRACTURES): VITD: 32.38 ng/mL (ref 30.00–100.00)

## 2021-04-08 ENCOUNTER — Ambulatory Visit: Payer: BC Managed Care – PPO | Admitting: Internal Medicine

## 2021-04-10 ENCOUNTER — Ambulatory Visit: Payer: BC Managed Care – PPO | Admitting: Internal Medicine

## 2021-04-10 ENCOUNTER — Encounter: Payer: Self-pay | Admitting: Internal Medicine

## 2021-04-10 ENCOUNTER — Other Ambulatory Visit: Payer: Self-pay

## 2021-04-10 VITALS — BP 156/88 | HR 68 | Temp 98.2°F | Ht 62.0 in | Wt 180.0 lb

## 2021-04-10 DIAGNOSIS — R739 Hyperglycemia, unspecified: Secondary | ICD-10-CM

## 2021-04-10 DIAGNOSIS — F411 Generalized anxiety disorder: Secondary | ICD-10-CM

## 2021-04-10 DIAGNOSIS — Z0001 Encounter for general adult medical examination with abnormal findings: Secondary | ICD-10-CM

## 2021-04-10 DIAGNOSIS — E559 Vitamin D deficiency, unspecified: Secondary | ICD-10-CM | POA: Diagnosis not present

## 2021-04-10 DIAGNOSIS — Z23 Encounter for immunization: Secondary | ICD-10-CM

## 2021-04-10 DIAGNOSIS — I10 Essential (primary) hypertension: Secondary | ICD-10-CM | POA: Diagnosis not present

## 2021-04-10 DIAGNOSIS — E7800 Pure hypercholesterolemia, unspecified: Secondary | ICD-10-CM | POA: Diagnosis not present

## 2021-04-10 MED ORDER — ROSUVASTATIN CALCIUM 10 MG PO TABS: 10.0000 mg | ORAL_TABLET | Freq: Every day | ORAL | 3 refills | Status: DC

## 2021-04-10 MED ORDER — PANTOPRAZOLE SODIUM 40 MG PO TBEC: 40.0000 mg | DELAYED_RELEASE_TABLET | Freq: Two times a day (BID) | ORAL | 3 refills | Status: DC

## 2021-04-10 MED ORDER — AMLODIPINE BESYLATE 5 MG PO TABS: 5.0000 mg | ORAL_TABLET | Freq: Every day | ORAL | 3 refills | Status: DC

## 2021-04-10 MED ORDER — METOPROLOL SUCCINATE ER 50 MG PO TB24: 50.0000 mg | ORAL_TABLET | Freq: Every day | ORAL | 3 refills | Status: DC

## 2021-04-10 MED ORDER — HYDROCHLOROTHIAZIDE 12.5 MG PO CAPS: 12.5000 mg | ORAL_CAPSULE | Freq: Every day | ORAL | 3 refills | Status: DC

## 2021-04-10 MED ORDER — OLMESARTAN MEDOXOMIL 40 MG PO TABS: 40.0000 mg | ORAL_TABLET | Freq: Every day | ORAL | 3 refills | Status: DC

## 2021-04-10 MED ORDER — SERTRALINE HCL 100 MG PO TABS: 200.0000 mg | ORAL_TABLET | Freq: Every day | ORAL | 3 refills | Status: DC

## 2021-04-10 NOTE — Assessment & Plan Note (Signed)
Last vitamin D Lab Results  Component Value Date   VD25OH 32.38 04/06/2021   Low, to start oral replacement

## 2021-04-10 NOTE — Progress Notes (Signed)
Patient ID: [name], female   DOB: 10-24-1965, 55 y.o.   MRN: [id number]         Chief Complaint:: wellness exam and low vitd, htn, hld, hyperglyemia       HPI:  [name] is a 55 y.o. female here for wellness exam; declines shingrix, o/w up to date with preventive referrals and immunizations                        Also not taking vit d.  Pt denies chest pain, increased sob or doe, wheezing, orthopnea, PND, increased LE swelling, palpitations, dizziness or syncope.   Pt denies polydipsia, polyuria, or new focal neuro s/s.   Pt denies fever, wt loss, night sweats, loss of appetite, or other constitutional symptoms  BP at hoem < 140/90 per pt.  Denies worsening depressive symptoms, suicidal ideation, or panic; needs med refills   No other new complaints   Wt Readings from Last 3 Encounters:  04/10/21 180 lb (81.6 kg)  10/09/20 180 lb (81.6 kg)  07/07/20 180 lb (81.6 kg)   BP Readings from Last 3 Encounters:  04/10/21 (!) 156/88  12/26/20 130/84  10/09/20 140/80   Immunization History  Administered Date(s) Administered   Influenza Whole 07/04/2007   Influenza, Quadrivalent, Recombinant, Inj, Pf 04/24/2019   Influenza,inj,Quad PF,6+ Mos 03/27/2014, 05/09/2015, 05/27/2017, 05/30/2018, 04/10/2021   Influenza-Unspecified 04/18/2012, 04/24/2019, 04/25/2020   PFIZER(Purple Top)SARS-COV-2 Vaccination 09/15/2019, 10/06/2019, 04/25/2020, 10/24/2020   Pneumococcal Conjugate-13 10/03/2018   Td 01/07/2009   Tdap 06/01/2019   There are no preventive care reminders to display for this patient.     Past Medical History:  Diagnosis Date   Allergic rhinitis    Allergy    Anxiety    Depression    GERD (gastroesophageal reflux disease)    Hyperlipidemia    Hyperplastic colon polyp 10/2003   Hypertension    Menorrhagia    Obesity    PONV (postoperative nausea and vomiting)    RLS (restless legs syndrome)    Past Surgical History:  Procedure Laterality Date    COLONOSCOPY     CYSTOSCOPY N/A 10/12/2016   Procedure: CYSTOSCOPY;  Surgeon: Anastasio Auerbach, MD;  Location: [facility];  Service: Gynecology;  Laterality: N/A;   ENDOMETRIAL ABLATION  2007   Dr. Landry Mellow at [facility] WITH SALPINGECTOMY Bilateral 10/12/2016   Procedure: LAPAROSCOPIC ASSISTED VAGINAL HYSTERECTOMY WITH SALPINGECTOMY;  Surgeon: Anastasio Auerbach, MD;  Location: [facility];  Service: Gynecology;  Laterality: Bilateral;  request to follow first case at around 10:30am.  Requests 2 1/2 hours for the case.   SALIVARY GLAND SURGERY  12-11   LEFT SIDE NODULE REMOVED WAS BENIGN   TUBAL LIGATION      reports that she has never smoked. She has never used smokeless tobacco. She reports that she does not currently use alcohol. She reports that she does not use drugs. family history includes Breast cancer in her maternal aunt; Cancer in her mother; Colon cancer in her mother; Colon polyps in her father; Heart attack in her maternal grandfather; Hyperlipidemia in an other family member; Hypertension in an other family member. Allergies  Allergen Reactions   Elemental Sulfur Hives   Other     POLLEN AND DUST   Current Outpatient Medications on File Prior to Visit  Medication Sig Dispense Refill   clonazePAM (KLONOPIN) 0.5 MG tablet Take 1 tablet  by mouth twice daily as needed for anxiety 60 tablet 2   famotidine (PEPCID) 20 MG tablet Take 1 tablet (20 mg total) by mouth 2 (two) times daily. 180 tablet 3   fexofenadine (ALLEGRA) 180 MG tablet Take 1 tablet by mouth once daily 90 tablet 0   Fluocinolone Acetonide Body 0.01 % OIL Apply 1 application topically 2 (two) times daily as needed (flaking of scalp).   11   Fluticasone-Salmeterol (ADVAIR DISKUS) 250-50 MCG/DOSE AEPB Inhale 1 puff into the lungs 2 (two) times daily. 3 each 1   linaclotide (LINZESS) 145 MCG CAPS capsule Take 1 capsule (145 mcg total) by mouth daily. 90 capsule 1    ondansetron (ZOFRAN-ODT) 4 MG disintegrating tablet Take 1 tablet (4 mg total) by mouth every 8 (eight) hours as needed for nausea or vomiting. 20 tablet 0   potassium chloride (KLOR-CON) 10 MEQ tablet Take 2 tablets (20 mEq total) by mouth daily. 180 tablet 3   tizanidine (ZANAFLEX) 2 MG capsule Take 1 capsule (2 mg total) by mouth 3 (three) times daily as needed for muscle spasms. 90 capsule 1   triamcinolone (NASACORT) 55 MCG/ACT AERO nasal inhaler Place 2 sprays into the nose daily. 1 Inhaler 12   [DISCONTINUED] fluticasone (FLONASE) 50 MCG/ACT nasal spray 2 sprays by Nasal route daily. 16 g 2   No current facility-administered medications on file prior to visit.        ROS:  All others reviewed and negative.  Objective        PE:  BP (!) 156/88 (BP Location: Left Arm, Patient Position: Sitting, Cuff Size: Normal)   Pulse 68   Temp 98.2 F (36.8 C) (Oral)   Ht 5\' 2"  (1.575 m)   Wt 180 lb (81.6 kg)   LMP 10/01/2016   SpO2 100%   BMI 32.92 kg/m                 Constitutional: Pt appears in NAD               HENT: Head: NCAT.                Right Ear: External ear normal.                 Left Ear: External ear normal.                Eyes: . Pupils are equal, round, and reactive to light. Conjunctivae and EOM are normal               Nose: without d/c or deformity               Neck: Neck supple. Gross normal ROM               Cardiovascular: Normal rate and regular rhythm.                 Pulmonary/Chest: Effort normal and breath sounds without rales or wheezing.                Abd:  Soft, NT, ND, + BS, no organomegaly               Neurological: Pt is alert. At baseline orientation, motor grossly intact               Skin: Skin is warm. No rashes, no other new lesions, LE edema - none  Psychiatric: Pt behavior is normal without agitation   Micro: none  Cardiac tracings I have personally interpreted today:  none  Pertinent Radiological findings (summarize): none    Lab Results  Component Value Date   WBC 7.4 04/06/2021   HGB 12.7 04/06/2021   HCT 38.6 04/06/2021   PLT 180.0 04/06/2021   GLUCOSE 94 04/06/2021   CHOL 154 04/06/2021   TRIG 92.0 04/06/2021   HDL 46.90 04/06/2021   LDLDIRECT 156.0 04/17/2013   LDLCALC 88 04/06/2021   ALT 11 04/06/2021   AST 20 04/06/2021   NA 138 04/06/2021   K 3.7 04/06/2021   CL 99 04/06/2021   CREATININE 0.84 04/06/2021   BUN 11 04/06/2021   CO2 31 04/06/2021   TSH 1.63 04/06/2021   HGBA1C 5.9 04/06/2021   Assessment/Plan:  [name] is a 55 y.o. Black or African American [2] female with  has a past medical history of Allergic rhinitis, Allergy, Anxiety, Depression, GERD (gastroesophageal reflux disease), Hyperlipidemia, Hyperplastic colon polyp (10/2003), Hypertension, Menorrhagia, Obesity, PONV (postoperative nausea and vomiting), and RLS (restless legs syndrome).  Vitamin D deficiency Last vitamin D Lab Results  Component Value Date   VD25OH 32.38 04/06/2021   Low, to start oral replacement   Encounter for well adult exam with abnormal findings Age and sex appropriate education and counseling updated with regular exercise and diet Referrals for preventative services - none needed  Immunizations addressed - declines shingrix, ok for flu shot Smoking counseling  - none needed Evidence for depression or other mood disorder - anxiety overall stable chronic Most recent labs reviewed. I have personally reviewed and have noted: 1) the patient's medical and social history 2) The patient's current medications and supplements 3) The patient's height, weight, and BMI have been recorded in the chart   Primary hypertension BP Readings from Last 3 Encounters:  04/10/21 (!) 156/88  12/26/20 130/84  10/09/20 140/80   Uncontrolled here, pt to continue medical treatment as decliens change  - norvasc, hct, toprol, benicar   Hyperlipidemia Lab Results  Component Value Date   LDLCALC 88  04/06/2021   Stable, pt to continue current low chol diet, declines statin   Hyperglycemia Lab Results  Component Value Date   HGBA1C 5.9 04/06/2021   Stable, pt to continue current medical treatment  - diet   Anxiety state Stable, cont zoloft and klonopin prn  Followup: Return in about 1 year (around 04/10/2022).  [name], MD 04/12/2021 10:18 PM [facility]

## 2021-04-10 NOTE — Patient Instructions (Signed)
Please take OTC Vitamin D3 at 2000 units per day, indefinitely  Please continue to monitor the BP at home, with the goal to be at least less than 140/90 on average  Please continue all other medications as before, and refills have been done if requested.  Please have the pharmacy call with any other refills you may need.  Please continue your efforts at being more active, low cholesterol diet, and weight control.  You are otherwise up to date with prevention measures today.  Please keep your appointments with your specialists as you may have planned  Please make an Appointment to return for your 1 year visit, or sooner if needed, with Lab testing by Appointment as well, to be done about 3-5 days before at the [facility] (so this is for TWO appointments - please see the scheduling desk as you leave)  Due to the ongoing Covid 19 pandemic, our lab now requires an appointment for any labs done at our office.  If you need labs done and do not have an appointment, please call our office ahead of time to schedule before presenting to the lab for your testing.

## 2021-04-12 ENCOUNTER — Encounter: Payer: Self-pay | Admitting: Internal Medicine

## 2021-04-12 NOTE — Assessment & Plan Note (Signed)
Stable, cont zoloft and klonopin prn

## 2021-04-12 NOTE — Assessment & Plan Note (Signed)
BP Readings from Last 3 Encounters:  04/10/21 (!) 156/88  12/26/20 130/84  10/09/20 140/80   Uncontrolled here, pt to continue medical treatment as decliens change  - norvasc, hct, toprol, benicar

## 2021-04-12 NOTE — Assessment & Plan Note (Addendum)
Age and sex appropriate education and counseling updated with regular exercise and diet Referrals for preventative services - none needed  Immunizations addressed - declines shingrix, ok for flu shot Smoking counseling  - none needed Evidence for depression or other mood disorder - anxiety overall stable chronic Most recent labs reviewed. I have personally reviewed and have noted: 1) the patient's medical and social history 2) The patient's current medications and supplements 3) The patient's height, weight, and BMI have been recorded in the chart

## 2021-04-12 NOTE — Assessment & Plan Note (Signed)
Lab Results  Component Value Date   LDLCALC 88 04/06/2021   Stable, pt to continue current low chol diet, declines statin

## 2021-04-12 NOTE — Assessment & Plan Note (Signed)
Lab Results  Component Value Date   HGBA1C 5.9 04/06/2021   Stable, pt to continue current medical treatment  - diet

## 2021-04-22 ENCOUNTER — Telehealth: Payer: BC Managed Care – PPO | Admitting: Family

## 2021-04-22 DIAGNOSIS — J0110 Acute frontal sinusitis, unspecified: Secondary | ICD-10-CM | POA: Diagnosis not present

## 2021-04-22 MED ORDER — AMOXICILLIN-POT CLAVULANATE 875-125 MG PO TABS: 1.0000 | ORAL_TABLET | Freq: Two times a day (BID) | ORAL | 0 refills | Status: DC

## 2021-04-22 MED ORDER — FLUCONAZOLE 150 MG PO TABS: 150.0000 mg | ORAL_TABLET | ORAL | 0 refills | Status: DC | PRN

## 2021-04-22 NOTE — Progress Notes (Signed)
Virtual Visit Consent   [name], you are scheduled for a virtual visit with a [facility] provider today.     Just as with appointments in the office, your consent must be obtained to participate.  Your consent will be active for this visit and any virtual visit you may have with one of our providers in the next 365 days.     If you have a MyChart account, a copy of this consent can be sent to you electronically.  All virtual visits are billed to your insurance company just like a traditional visit in the office.    As this is a virtual visit, video technology does not allow for your provider to perform a traditional examination.  This may limit your provider's ability to fully assess your condition.  If your provider identifies any concerns that need to be evaluated in person or the need to arrange testing (such as labs, EKG, etc.), we will make arrangements to do so.     Although advances in technology are sophisticated, we cannot ensure that it will always work on either your end or our end.  If the connection with a video visit is poor, the visit may have to be switched to a telephone visit.  With either a video or telephone visit, we are not always able to ensure that we have a secure connection.     I need to obtain your verbal consent now.   Are you willing to proceed with your visit today?    [name] has provided verbal consent on 04/22/2021 for a virtual visit (video or telephone).   Evelina Dun, FNP   Date: 04/22/2021 2:26 PM   Virtual Visit via Video Note   I, Evelina Dun, connected with  [name]  ([id number], 10/07/1965) on 04/22/21 at  2:30 PM EDT by a video-enabled telemedicine application and verified that I am speaking with the correct person using two identifiers.  Location: Patient: Virtual Visit Location Patient: Home Provider: Virtual Visit Location Provider: Home   I discussed the limitations of evaluation and management by  telemedicine and the availability of in person appointments. The patient expressed understanding and agreed to proceed.    History of Present Illness: [name] is a 55 y.o. who identifies as a female who was assigned female at birth, and is being seen today for sore throat that started three weeks on and off. She has done a home COVID teste that was negative. She took three days of bactrim she had left over. Reports she was feeling better, however, since running out her symptoms have returned and is worse.   Marland Kitchen HPI: Sore Throat  This is a new problem. The current episode started 1 to 4 weeks ago. The problem has been waxing and waning. There has been no fever. The pain is at a severity of 4/10. The pain is moderate. Associated symptoms include congestion, coughing, ear pain, headaches, a hoarse voice, swollen glands and trouble swallowing. Pertinent negatives include no shortness of breath. She has had no exposure to strep. She has tried acetaminophen and gargles for the symptoms. The treatment provided mild relief.  Problems:  Patient Active Problem List   Diagnosis Date Noted   Vitamin D deficiency 04/10/2021   Hyperglycemia 10/12/2020   Venous insufficiency 10/12/2020   DOE (dyspnea on exertion) 06/24/2020   Primary hypertension 06/24/2020   Acute bilateral low back pain with bilateral sciatica 06/24/2020   Abnormal MRI, cervical spine 12/08/2019  Cervicalgia 12/08/2019   ETD (Eustachian tube dysfunction), bilateral 08/27/2019   Hearing loss 08/27/2019   B12 deficiency 06/02/2019   Vertigo 04/20/2019   Closed head injury 04/18/2019   Dysphagia 11/13/2018   Epigastric pain 11/13/2018   Anemia 11/13/2018   Asthma 10/10/2018   Neck pain on left side 03/21/2018   Nasal turbinate hypertrophy 05/17/2017   Deviated septum 04/19/2017   Bad breath 05/25/2016   Abnormal TSH 05/09/2015   Rhinitis, chronic 06/19/2014   Other and unspecified ovarian cyst 05/30/2013   Chronic  constipation 04/17/2013   Encounter for well adult exam with abnormal findings 10/12/2010   Hyperlipidemia 03/26/2007   Anxiety state 03/26/2007   Depression 03/26/2007   Chronic maxillary sinusitis 03/26/2007   GERD 03/26/2007   IBS 03/26/2007   COLONIC POLYPS, HX OF 03/26/2007    Allergies:  Allergies  Allergen Reactions   Elemental Sulfur Hives   Other     POLLEN AND DUST   Medications:  Current Outpatient Medications:    amoxicillin-clavulanate (AUGMENTIN) 875-125 MG tablet, Take 1 tablet by mouth 2 (two) times daily., Disp: 14 tablet, Rfl: 0   fluconazole (DIFLUCAN) 150 MG tablet, Take 1 tablet (150 mg total) by mouth every three (3) days as needed., Disp: 2 tablet, Rfl: 0   amLODipine (NORVASC) 5 MG tablet, Take 1 tablet (5 mg total) by mouth daily., Disp: 90 tablet, Rfl: 3   clonazePAM (KLONOPIN) 0.5 MG tablet, Take 1 tablet by mouth twice daily as needed for anxiety, Disp: 60 tablet, Rfl: 2   famotidine (PEPCID) 20 MG tablet, Take 1 tablet (20 mg total) by mouth 2 (two) times daily., Disp: 180 tablet, Rfl: 3   fexofenadine (ALLEGRA) 180 MG tablet, Take 1 tablet by mouth once daily, Disp: 90 tablet, Rfl: 0   Fluocinolone Acetonide Body 0.01 % OIL, Apply 1 application topically 2 (two) times daily as needed (flaking of scalp). , Disp: , Rfl: 11   Fluticasone-Salmeterol (ADVAIR DISKUS) 250-50 MCG/DOSE AEPB, Inhale 1 puff into the lungs 2 (two) times daily., Disp: 3 each, Rfl: 1   hydrochlorothiazide (MICROZIDE) 12.5 MG capsule, Take 1 capsule (12.5 mg total) by mouth daily., Disp: 90 capsule, Rfl: 3   linaclotide (LINZESS) 145 MCG CAPS capsule, Take 1 capsule (145 mcg total) by mouth daily., Disp: 90 capsule, Rfl: 1   metoprolol succinate (TOPROL-XL) 50 MG 24 hr tablet, Take 1 tablet (50 mg total) by mouth daily. Take with or immediately following a meal., Disp: 90 tablet, Rfl: 3   olmesartan (BENICAR) 40 MG tablet, Take 1 tablet (40 mg total) by mouth daily., Disp: 90 tablet, Rfl:  3   ondansetron (ZOFRAN-ODT) 4 MG disintegrating tablet, Take 1 tablet (4 mg total) by mouth every 8 (eight) hours as needed for nausea or vomiting., Disp: 20 tablet, Rfl: 0   pantoprazole (PROTONIX) 40 MG tablet, Take 1 tablet (40 mg total) by mouth 2 (two) times daily., Disp: 180 tablet, Rfl: 3   potassium chloride (KLOR-CON) 10 MEQ tablet, Take 2 tablets (20 mEq total) by mouth daily., Disp: 180 tablet, Rfl: 3   rosuvastatin (CRESTOR) 10 MG tablet, Take 1 tablet (10 mg total) by mouth daily., Disp: 90 tablet, Rfl: 3   sertraline (ZOLOFT) 100 MG tablet, Take 2 tablets (200 mg total) by mouth daily., Disp: 180 tablet, Rfl: 3   tizanidine (ZANAFLEX) 2 MG capsule, Take 1 capsule (2 mg total) by mouth 3 (three) times daily as needed for muscle spasms., Disp: 90 capsule, Rfl: 1  triamcinolone (NASACORT) 55 MCG/ACT AERO nasal inhaler, Place 2 sprays into the nose daily., Disp: 1 Inhaler, Rfl: 12  Observations/Objective: Patient is well-developed, well-nourished in no acute distress.  Resting comfortably  at home.  Head is normocephalic, atraumatic.  No labored breathing. Speech is clear and coherent with logical content.  Patient is alert and oriented at baseline.  Nasal congestion   Assessment and Plan: 1. Acute frontal sinusitis, recurrence not specified - amoxicillin-clavulanate (AUGMENTIN) 875-125 MG tablet; Take 1 tablet by mouth 2 (two) times daily.  Dispense: 14 tablet; Refill: 0 - Take meds as prescribed - Use a cool mist humidifier  -Use saline nose sprays frequently -Force fluids -For any cough or congestion  Use plain Mucinex- regular strength or max strength is fine -For fever or aces or pains- take tylenol or ibuprofen. -Throat lozenges if help -New toothbrush in 3 days   Follow Up Instructions: I discussed the assessment and treatment plan with the patient. The patient was provided an opportunity to ask questions and all were answered. The patient agreed with the plan and  demonstrated an understanding of the instructions.  A copy of instructions were sent to the patient via MyChart unless otherwise noted below.     The patient was advised to call back or seek an in-person evaluation if the symptoms worsen or if the condition fails to improve as anticipated.  Time:  I spent 13 minutes with the patient via telehealth technology discussing the above problems/concerns.    Evelina Dun, FNP

## 2021-04-22 NOTE — Patient Instructions (Signed)

## 2021-04-23 ENCOUNTER — Other Ambulatory Visit: Payer: Self-pay | Admitting: Family

## 2021-04-23 MED ORDER — BENZONATATE 200 MG PO CAPS: 200.0000 mg | ORAL_CAPSULE | Freq: Three times a day (TID) | ORAL | 1 refills | Status: DC | PRN

## 2021-06-15 ENCOUNTER — Ambulatory Visit: Payer: BC Managed Care – PPO | Admitting: Nurse Practitioner

## 2021-06-18 ENCOUNTER — Ambulatory Visit: Payer: BC Managed Care – PPO | Admitting: Internal Medicine

## 2021-07-09 ENCOUNTER — Telehealth: Payer: BC Managed Care – PPO | Admitting: Internal Medicine

## 2021-07-09 NOTE — Progress Notes (Signed)
Patient ID: [name], female   DOB: 1965-08-20, 55 y.o.   MRN: [id number]  Unable to connect x 3 tries to [phone] as person answering the phone states she is no longer at this number  [name] MD

## 2021-07-10 ENCOUNTER — Encounter: Payer: Self-pay | Admitting: Internal Medicine

## 2021-07-10 NOTE — Patient Instructions (Signed)
none

## 2021-08-18 ENCOUNTER — Encounter: Payer: Self-pay | Admitting: Obstetrics & Gynecology

## 2021-08-27 ENCOUNTER — Encounter: Payer: Self-pay | Admitting: Internal Medicine

## 2021-08-27 ENCOUNTER — Ambulatory Visit: Payer: BC Managed Care – PPO | Admitting: Internal Medicine

## 2021-08-27 ENCOUNTER — Other Ambulatory Visit: Payer: Self-pay

## 2021-08-27 VITALS — BP 142/80 | HR 64 | Temp 98.8°F | Ht 62.0 in | Wt 182.0 lb

## 2021-08-27 DIAGNOSIS — E559 Vitamin D deficiency, unspecified: Secondary | ICD-10-CM | POA: Diagnosis not present

## 2021-08-27 DIAGNOSIS — I10 Essential (primary) hypertension: Secondary | ICD-10-CM | POA: Diagnosis not present

## 2021-08-27 DIAGNOSIS — R1013 Epigastric pain: Secondary | ICD-10-CM

## 2021-08-27 DIAGNOSIS — Z0001 Encounter for general adult medical examination with abnormal findings: Secondary | ICD-10-CM | POA: Diagnosis not present

## 2021-08-27 DIAGNOSIS — R739 Hyperglycemia, unspecified: Secondary | ICD-10-CM

## 2021-08-27 DIAGNOSIS — E538 Deficiency of other specified B group vitamins: Secondary | ICD-10-CM | POA: Diagnosis not present

## 2021-08-27 DIAGNOSIS — E7849 Other hyperlipidemia: Secondary | ICD-10-CM | POA: Diagnosis not present

## 2021-08-27 LAB — MICROALBUMIN / CREATININE URINE RATIO
Creatinine,U: 101.2 mg/dL
Microalb Creat Ratio: 1.6 mg/g (ref 0.0–30.0)
Microalb, Ur: 1.6 mg/dL (ref 0.0–1.9)

## 2021-08-27 LAB — URINALYSIS, ROUTINE W REFLEX MICROSCOPIC
Bilirubin Urine: NEGATIVE
Hgb urine dipstick: NEGATIVE
Ketones, ur: NEGATIVE
Leukocytes,Ua: NEGATIVE
Nitrite: NEGATIVE
Specific Gravity, Urine: 1.015 (ref 1.000–1.030)
Total Protein, Urine: NEGATIVE
Urine Glucose: NEGATIVE
Urobilinogen, UA: 0.2 (ref 0.0–1.0)
pH: 6.5 (ref 5.0–8.0)

## 2021-08-27 LAB — CBC WITH DIFFERENTIAL/PLATELET
Basophils Absolute: 0 10*3/uL (ref 0.0–0.1)
Basophils Relative: 0.1 % (ref 0.0–3.0)
Eosinophils Absolute: 0 10*3/uL (ref 0.0–0.7)
Eosinophils Relative: 0.5 % (ref 0.0–5.0)
HCT: 39 % (ref 36.0–46.0)
Hemoglobin: 12.9 g/dL (ref 12.0–15.0)
Lymphocytes Relative: 31.2 % (ref 12.0–46.0)
Lymphs Abs: 2.3 10*3/uL (ref 0.7–4.0)
MCHC: 33.2 g/dL (ref 30.0–36.0)
MCV: 89.6 fl (ref 78.0–100.0)
Monocytes Absolute: 0.6 10*3/uL (ref 0.1–1.0)
Monocytes Relative: 8 % (ref 3.0–12.0)
Neutro Abs: 4.4 10*3/uL (ref 1.4–7.7)
Neutrophils Relative %: 60.2 % (ref 43.0–77.0)
Platelets: 188 10*3/uL (ref 150.0–400.0)
RBC: 4.35 Mil/uL (ref 3.87–5.11)
RDW: 14.4 % (ref 11.5–15.5)
WBC: 7.2 10*3/uL (ref 4.0–10.5)

## 2021-08-27 LAB — HEMOGLOBIN A1C: Hgb A1c MFr Bld: 6 % (ref 4.6–6.5)

## 2021-08-27 LAB — BASIC METABOLIC PANEL
BUN: 14 mg/dL (ref 6–23)
CO2: 37 mEq/L — ABNORMAL HIGH (ref 19–32)
Calcium: 10 mg/dL (ref 8.4–10.5)
Chloride: 100 mEq/L (ref 96–112)
Creatinine, Ser: 0.77 mg/dL (ref 0.40–1.20)
GFR: 88.22 mL/min (ref >60.00)
Glucose, Bld: 87 mg/dL (ref 70–99)
Potassium: 3.6 mEq/L (ref 3.5–5.1)
Sodium: 142 mEq/L (ref 135–145)

## 2021-08-27 LAB — LIPID PANEL
Cholesterol: 157 mg/dL (ref 0–200)
HDL: 51.4 mg/dL (ref >39.00)
LDL Cholesterol: 88 mg/dL (ref 0–99)
NonHDL: 105.4
Total CHOL/HDL Ratio: 3
Triglycerides: 86 mg/dL (ref 0.0–149.0)
VLDL: 17.2 mg/dL (ref 0.0–40.0)

## 2021-08-27 LAB — VITAMIN D 25 HYDROXY (VIT D DEFICIENCY, FRACTURES): VITD: 48.36 ng/mL (ref 30.00–100.00)

## 2021-08-27 LAB — H. PYLORI ANTIBODY, IGG: H Pylori IgG: NEGATIVE

## 2021-08-27 LAB — HEPATIC FUNCTION PANEL
ALT: 15 U/L (ref 0–35)
AST: 22 U/L (ref 0–37)
Albumin: 4.5 g/dL (ref 3.5–5.2)
Alkaline Phosphatase: 56 U/L (ref 39–117)
Bilirubin, Direct: 0.1 mg/dL (ref 0.0–0.3)
Total Bilirubin: 0.5 mg/dL (ref 0.2–1.2)
Total Protein: 8.2 g/dL (ref 6.0–8.3)

## 2021-08-27 LAB — VITAMIN B12: Vitamin B-12: 796 pg/mL (ref 211–911)

## 2021-08-27 LAB — TSH: TSH: 0.94 u[IU]/mL (ref 0.35–5.50)

## 2021-08-27 LAB — LIPASE: Lipase: 19 U/L (ref 11.0–59.0)

## 2021-08-27 MED ORDER — CLONIDINE 0.1 MG/24HR TD PTWK: 0.1000 mg | MEDICATED_PATCH | TRANSDERMAL | 12 refills | Status: DC

## 2021-08-27 NOTE — Assessment & Plan Note (Signed)
Lab Results  Component Value Date   [id number] 04/06/2021   Stable, cont oral replacement - b12 1000 mcg qd

## 2021-08-27 NOTE — Assessment & Plan Note (Signed)
Last vitamin D Lab Results  Component Value Date   VD25OH 32.38 04/06/2021   Low, to start oral replacement

## 2021-08-27 NOTE — Assessment & Plan Note (Signed)
Lab Results  Component Value Date   LDLCALC 88 04/06/2021   Stable, pt to continue current statin crestor 10

## 2021-08-27 NOTE — Assessment & Plan Note (Signed)
BP Readings from Last 3 Encounters:  08/27/21 (!) 142/80  04/10/21 (!) 156/88  12/26/20 130/84   Mild uncontrolled recently, given hot flashes will add catapres patch 0.1 weekly, pt to continue medical treatment - norvasc, benicar, toprol

## 2021-08-27 NOTE — Assessment & Plan Note (Signed)
Etiology unclear, to restart pepcid, also check h pylori Ab, lipase, consider GI referral

## 2021-08-27 NOTE — Patient Instructions (Signed)
Please check with your insurance about the [employer] coverage  Please take all new medication as prescribed  - the clonidine patch for BP and hot flashes  Please continue all other medications as before, and refills have been done if requested.  Please have the pharmacy call with any other refills you may need.  Please continue your efforts at being more active, low cholesterol diet, and weight control.  You are otherwise up to date with prevention measures today.  Please keep your appointments with your specialists as you may have planned  Please go to the LAB at the blood drawing area for the tests to be done  You will be contacted by phone if any changes need to be made immediately.  Otherwise, you will receive a letter about your results with an explanation, but please check with MyChart first.  Please remember to sign up for MyChart if you have not done so, as this will be important to you in the future with finding out test results, communicating by private email, and scheduling acute appointments online when needed.  Please make an Appointment to return in 6 months, or sooner if needed

## 2021-08-27 NOTE — Progress Notes (Signed)
Patient ID: [name], female   DOB: 1965-12-05, 56 y.o.   MRN: [id number]         Chief Complaint:: wellness exam and Office Visit (Discuss menopause and shingles vaccine)         HPI:  [name] is a 56 y.o. female here for wellness exam; due for shingirx but not sure if insurance covers this, o/w up to date                   Also now 56 yo and with frequent hot flashes several times per day, believes it to be menopausal symptoms as has not occurred before and is s/p TAH so unable to tell by lack of menses.  Pt denies chest pain, increased sob or doe, wheezing, orthopnea, PND, increased LE swelling, palpitations, dizziness or syncope.   Pt denies polydipsia, polyuria, or new focal neuro s/s.   Pt denies fever, wt loss, night sweats, loss of appetite, or other constitutional symptoms  Denies worsening reflux, dysphagia, n/v, or blood but does have midl to mod worsening intermittent epigastric pain for 2 -3 wks without radiation.  Denies urinary symptoms such as dysuria, frequency, urgency, flank pain, hematuria or n/v, fever, chills. Does have ongoing constipation, and has not been taking her pepcid or linzess recent, but willing to restart.     Wt Readings from Last 3 Encounters:  08/27/21 182 lb (82.6 kg)  04/10/21 180 lb (81.6 kg)  10/09/20 180 lb (81.6 kg)   BP Readings from Last 3 Encounters:  08/27/21 (!) 142/80  04/10/21 (!) 156/88  12/26/20 130/84   Immunization History  Administered Date(s) Administered   Influenza Whole 07/04/2007   Influenza, Quadrivalent, Recombinant, Inj, Pf 04/24/2019   Influenza,inj,Quad PF,6+ Mos 03/27/2014, 05/09/2015, 05/27/2017, 05/30/2018, 04/10/2021   Influenza-Unspecified 04/18/2012, 04/24/2019, 04/25/2020   PFIZER(Purple Top)SARS-COV-2 Vaccination 09/15/2019, 10/06/2019, 04/25/2020, 10/24/2020   Pneumococcal Conjugate-13 10/03/2018   Td 01/07/2009   Tdap 06/01/2019   Health Maintenance Due  Topic Date Due   Zoster Vaccines-  Shingrix (1 of 2) Never done     Past Medical History:  Diagnosis Date   Allergic rhinitis    Allergy    Anxiety    Depression    GERD (gastroesophageal reflux disease)    Hyperlipidemia    Hyperplastic colon polyp 10/2003   Hypertension    Menorrhagia    Obesity    PONV (postoperative nausea and vomiting)    RLS (restless legs syndrome)    Past Surgical History:  Procedure Laterality Date   COLONOSCOPY     CYSTOSCOPY N/A 10/12/2016   Procedure: CYSTOSCOPY;  Surgeon: Anastasio Auerbach, MD;  Location: [facility];  Service: Gynecology;  Laterality: N/A;   ENDOMETRIAL ABLATION  2007   Dr. Landry Mellow at [place] WITH SALPINGECTOMY Bilateral 10/12/2016   Procedure: LAPAROSCOPIC ASSISTED VAGINAL HYSTERECTOMY WITH SALPINGECTOMY;  Surgeon: Anastasio Auerbach, MD;  Location: [facility];  Service: Gynecology;  Laterality: Bilateral;  request to follow first case at around 10:30am.  Requests 2 1/2 hours for the case.   SALIVARY GLAND SURGERY  12-11   LEFT SIDE NODULE REMOVED WAS BENIGN   TUBAL LIGATION      reports that she has never smoked. She has never used smokeless tobacco. She reports that she does not currently use alcohol. She reports that she does not use drugs. family history includes Breast cancer in her maternal aunt; Cancer in  her mother; Colon cancer in her mother; Colon polyps in her father; Heart attack in her maternal grandfather; Hyperlipidemia in an other family member; Hypertension in an other family member. Allergies  Allergen Reactions   Elemental Sulfur Hives   Other     POLLEN AND DUST   Current Outpatient Medications on File Prior to Visit  Medication Sig Dispense Refill   amLODipine (NORVASC) 5 MG tablet Take 1 tablet (5 mg total) by mouth daily. 90 tablet 3   clonazePAM (KLONOPIN) 0.5 MG tablet Take 1 tablet by mouth twice daily as needed for anxiety 60 tablet 2   famotidine (PEPCID) 20 MG tablet Take 1 tablet (20  mg total) by mouth 2 (two) times daily. 180 tablet 3   fexofenadine (ALLEGRA) 180 MG tablet Take 1 tablet by mouth once daily 90 tablet 0   Fluocinolone Acetonide Body 0.01 % OIL Apply 1 application topically 2 (two) times daily as needed (flaking of scalp).   11   hydrochlorothiazide (MICROZIDE) 12.5 MG capsule Take 1 capsule (12.5 mg total) by mouth daily. 90 capsule 3   linaclotide (LINZESS) 145 MCG CAPS capsule Take 1 capsule (145 mcg total) by mouth daily. 90 capsule 1   metoprolol succinate (TOPROL-XL) 50 MG 24 hr tablet Take 1 tablet (50 mg total) by mouth daily. Take with or immediately following a meal. 90 tablet 3   olmesartan (BENICAR) 40 MG tablet Take 1 tablet (40 mg total) by mouth daily. 90 tablet 3   ondansetron (ZOFRAN-ODT) 4 MG disintegrating tablet Take 1 tablet (4 mg total) by mouth every 8 (eight) hours as needed for nausea or vomiting. 20 tablet 0   pantoprazole (PROTONIX) 40 MG tablet Take 1 tablet (40 mg total) by mouth 2 (two) times daily. 180 tablet 3   potassium chloride (KLOR-CON) 10 MEQ tablet Take 2 tablets (20 mEq total) by mouth daily. 180 tablet 3   rosuvastatin (CRESTOR) 10 MG tablet Take 1 tablet (10 mg total) by mouth daily. 90 tablet 3   sertraline (ZOLOFT) 100 MG tablet Take 2 tablets (200 mg total) by mouth daily. 180 tablet 3   tizanidine (ZANAFLEX) 2 MG capsule Take 1 capsule (2 mg total) by mouth 3 (three) times daily as needed for muscle spasms. 90 capsule 1   triamcinolone (NASACORT) 55 MCG/ACT AERO nasal inhaler Place 2 sprays into the nose daily. 1 Inhaler 12   Fluticasone-Salmeterol (ADVAIR DISKUS) 250-50 MCG/DOSE AEPB Inhale 1 puff into the lungs 2 (two) times daily. 3 each 1   [DISCONTINUED] fluticasone (FLONASE) 50 MCG/ACT nasal spray 2 sprays by Nasal route daily. 16 g 2   No current facility-administered medications on file prior to visit.        ROS:  All others reviewed and negative.  Objective        PE:  BP (!) 142/80 (BP Location: Right  Arm, Patient Position: Sitting, Cuff Size: Large)    Pulse 64    Temp 98.8 F (37.1 C) (Oral)    Ht 5\' 2"  (1.575 m)    Wt 182 lb (82.6 kg)    LMP 10/01/2016    SpO2 100%    BMI 33.29 kg/m                 Constitutional: Pt appears in NAD               HENT: Head: NCAT.                Right Ear:  External ear normal.                 Left Ear: External ear normal.                Eyes: . Pupils are equal, round, and reactive to light. Conjunctivae and EOM are normal               Nose: without d/c or deformity               Neck: Neck supple. Gross normal ROM               Cardiovascular: Normal rate and regular rhythm.                 Pulmonary/Chest: Effort normal and breath sounds without rales or wheezing.                Abd:  Soft, mild epigsatric tender, ND, + BS, no organomegaly               Neurological: Pt is alert. At baseline orientation, motor grossly intact               Skin: Skin is warm. No rashes, no other new lesions, LE edema - none               Psychiatric: Pt behavior is normal without agitation   Micro: none  Cardiac tracings I have personally interpreted today:  none  Pertinent Radiological findings (summarize): none   Lab Results  Component Value Date   WBC 7.4 04/06/2021   HGB 12.7 04/06/2021   HCT 38.6 04/06/2021   PLT 180.0 04/06/2021   GLUCOSE 94 04/06/2021   CHOL 154 04/06/2021   TRIG 92.0 04/06/2021   HDL 46.90 04/06/2021   LDLDIRECT 156.0 04/17/2013   LDLCALC 88 04/06/2021   ALT 11 04/06/2021   AST 20 04/06/2021   NA 138 04/06/2021   K 3.7 04/06/2021   CL 99 04/06/2021   CREATININE 0.84 04/06/2021   BUN 11 04/06/2021   CO2 31 04/06/2021   TSH 1.63 04/06/2021   HGBA1C 5.9 04/06/2021   Assessment/Plan:  [name] is a 56 y.o. Black or African American [2] female with  has a past medical history of Allergic rhinitis, Allergy, Anxiety, Depression, GERD (gastroesophageal reflux disease), Hyperlipidemia, Hyperplastic colon polyp  (10/2003), Hypertension, Menorrhagia, Obesity, PONV (postoperative nausea and vomiting), and RLS (restless legs syndrome).  Encounter for well adult exam with abnormal findings Age and sex appropriate education and counseling updated with regular exercise and diet Referrals for preventative services - none needed Immunizations addressed - due for shingrix but not clear if covered, pt will check on coverage and let us know Smoking counseling  - none needed Evidence for depression or other mood disorder - none significant Most recent labs reviewed. I have personally reviewed and have noted: 1) the patient's medical and social history 2) The patient's current medications and supplements 3) The patient's height, weight, and BMI have been recorded in the chart   B12 deficiency Lab Results  Component Value Date   [id number] 174 04/06/2021   Stable, cont oral replacement - b12 1000 mcg qd   Hyperglycemia Lab Results  Component Value Date   HGBA1C 5.9 04/06/2021   Stable, pt to continue current medical treatment  - diet   Hyperlipidemia Lab Results  Component Value Date   LDLCALC 88 04/06/2021   Stable, pt to continue current statin crestor 10  Primary hypertension BP Readings from Last 3 Encounters:  08/27/21 (!) 142/80  04/10/21 (!) 156/88  12/26/20 130/84   Mild uncontrolled recently, given hot flashes will add catapres patch 0.1 weekly, pt to continue medical treatment - norvasc, benicar, toprol   Vitamin D deficiency Last vitamin D Lab Results  Component Value Date   VD25OH 32.38 04/06/2021   Low, to start oral replacement   Epigastric pain Etiology unclear, to restart pepcid, also check h pylori Ab, lipase, consider GI referral  Followup: No follow-ups on file.  [name], MD 08/27/2021 1:13 PM [facility]

## 2021-08-27 NOTE — Assessment & Plan Note (Signed)
Lab Results  Component Value Date   HGBA1C 5.9 04/06/2021   Stable, pt to continue current medical treatment  - diet

## 2021-08-27 NOTE — Assessment & Plan Note (Signed)
Age and sex appropriate education and counseling updated with regular exercise and diet Referrals for preventative services - none needed Immunizations addressed - due for shingrix but not clear if covered, pt will check on coverage and let us know Smoking counseling  - none needed Evidence for depression or other mood disorder - none significant Most recent labs reviewed. I have personally reviewed and have noted: 1) the patient's medical and social history 2) The patient's current medications and supplements 3) The patient's height, weight, and BMI have been recorded in the chart

## 2021-10-05 ENCOUNTER — Other Ambulatory Visit: Payer: Self-pay | Admitting: Obstetrics

## 2021-10-05 DIAGNOSIS — Z1231 Encounter for screening mammogram for malignant neoplasm of breast: Secondary | ICD-10-CM

## 2021-11-13 ENCOUNTER — Ambulatory Visit: Payer: BC Managed Care – PPO | Admitting: Internal Medicine

## 2021-11-16 ENCOUNTER — Ambulatory Visit: Payer: BC Managed Care – PPO | Admitting: Internal Medicine

## 2021-11-24 ENCOUNTER — Telehealth: Payer: BC Managed Care – PPO | Admitting: Physician Assistant

## 2021-11-24 DIAGNOSIS — M5442 Lumbago with sciatica, left side: Secondary | ICD-10-CM | POA: Diagnosis not present

## 2021-11-24 MED ORDER — METHYLPREDNISOLONE 4 MG PO TBPK: ORAL_TABLET | ORAL | 0 refills | Status: DC

## 2021-11-24 NOTE — Progress Notes (Signed)
?Virtual Visit Consent  ? ?[name], you are scheduled for a virtual visit with a [facility] provider today. Just as with appointments in the office, your consent must be obtained to participate. Your consent will be active for this visit and any virtual visit you may have with one of our providers in the next 365 days. If you have a MyChart account, a copy of this consent can be sent to you electronically. ? ?As this is a virtual visit, video technology does not allow for your provider to perform a traditional examination. This may limit your provider's ability to fully assess your condition. If your provider identifies any concerns that need to be evaluated in person or the need to arrange testing (such as labs, EKG, etc.), we will make arrangements to do so. Although advances in technology are sophisticated, we cannot ensure that it will always work on either your end or our end. If the connection with a video visit is poor, the visit may have to be switched to a telephone visit. With either a video or telephone visit, we are not always able to ensure that we have a secure connection. ? ?By engaging in this virtual visit, you consent to the provision of healthcare and authorize for your insurance to be billed (if applicable) for the services provided during this visit. Depending on your insurance coverage, you may receive a charge related to this service. ? ?I need to obtain your verbal consent now. Are you willing to proceed with your visit today? [name] has provided verbal consent on 11/24/2021 for a virtual visit (video or telephone). [name], PA-C ? ?Date: 11/24/2021 10:25 AM ? ?Virtual Visit via Video Note  ? ?I, [name], connected with  [name]  ([id number], 27-Jun-1966) on 11/24/21 at 10:15 AM EDT by a video-enabled telemedicine application and verified that I am speaking with the correct person using two identifiers. ? ?Location: ?Patient: Virtual  Visit Location Patient: Home ?Provider: Virtual Visit Location Provider: Home Office ?  ?I discussed the limitations of evaluation and management by telemedicine and the availability of in person appointments. The patient expressed understanding and agreed to proceed.   ? ?History of Present Illness: ?[name] is a 56 y.o. who identifies as a female who was assigned female at birth, and is being seen today for back pain. Patient endorses lower back pain starting last week. Was initially bilateral and alleviated with ibuprofen. Over weekend it worsened with pain radiating down left lower leg into foot. Denies numbness, tingling or weakness of extremity. Denies saddle anesthesia.  Is taking OTC Ibuprofen. Last dose of ibuprofen -- last night. Has Rx for Zanaflex but has not used yet. Is also trying to take hot showers/baths.  ? ? ?HPI: HPI  ?Problems:  ?Patient Active Problem List  ? Diagnosis Date Noted  ? Vitamin D deficiency 04/10/2021  ? Hyperglycemia 10/12/2020  ? Venous insufficiency 10/12/2020  ? DOE (dyspnea on exertion) 06/24/2020  ? Primary hypertension 06/24/2020  ? Acute bilateral low back pain with bilateral sciatica 06/24/2020  ? Abnormal MRI, cervical spine 12/08/2019  ? Cervicalgia 12/08/2019  ? ETD (Eustachian tube dysfunction), bilateral 08/27/2019  ? Hearing loss 08/27/2019  ? B12 deficiency 06/02/2019  ? Vertigo 04/20/2019  ? Closed head injury 04/18/2019  ? Dysphagia 11/13/2018  ? Epigastric pain 11/13/2018  ? Anemia 11/13/2018  ? Asthma 10/10/2018  ? Neck pain on left side 03/21/2018  ? Nasal turbinate hypertrophy 05/17/2017  ?  Deviated septum 04/19/2017  ? Bad breath 05/25/2016  ? Abnormal TSH 05/09/2015  ? Rhinitis, chronic 06/19/2014  ? Other and unspecified ovarian cyst 05/30/2013  ? Chronic constipation 04/17/2013  ? Encounter for well adult exam with abnormal findings 10/12/2010  ? Hyperlipidemia 03/26/2007  ? Anxiety state 03/26/2007  ? Depression 03/26/2007  ? Chronic maxillary  sinusitis 03/26/2007  ? GERD 03/26/2007  ? IBS 03/26/2007  ? COLONIC POLYPS, HX OF 03/26/2007  ?  ?Allergies:  ?Allergies  ?Allergen Reactions  ? Elemental Sulfur Hives  ? Other   ?  POLLEN AND DUST  ? ?Medications:  ?Current Outpatient Medications:  ?  methylPREDNISolone (MEDROL DOSEPAK) 4 MG TBPK tablet, Take following package directions, Disp: 21 tablet, Rfl: 0 ?  amLODipine (NORVASC) 5 MG tablet, Take 1 tablet (5 mg total) by mouth daily., Disp: 90 tablet, Rfl: 3 ?  clonazePAM (KLONOPIN) 0.5 MG tablet, Take 1 tablet by mouth twice daily as needed for anxiety, Disp: 60 tablet, Rfl: 2 ?  cloNIDine (CATAPRES - DOSED IN MG/24 HR) 0.1 mg/24hr patch, Place 1 patch (0.1 mg total) onto the skin once a week., Disp: 4 patch, Rfl: 12 ?  estradiol (VIVELLE-DOT) 0.075 MG/24HR, 1 patch 2 (two) times a week., Disp: , Rfl:  ?  famotidine (PEPCID) 20 MG tablet, Take 1 tablet (20 mg total) by mouth 2 (two) times daily., Disp: 180 tablet, Rfl: 3 ?  fexofenadine (ALLEGRA) 180 MG tablet, Take 1 tablet by mouth once daily, Disp: 90 tablet, Rfl: 0 ?  Fluocinolone Acetonide Body 0.01 % OIL, Apply 1 application topically 2 (two) times daily as needed (flaking of scalp). , Disp: , Rfl: 11 ?  Fluticasone-Salmeterol (ADVAIR DISKUS) 250-50 MCG/DOSE AEPB, Inhale 1 puff into the lungs 2 (two) times daily., Disp: 3 each, Rfl: 1 ?  hydrochlorothiazide (MICROZIDE) 12.5 MG capsule, Take 1 capsule (12.5 mg total) by mouth daily., Disp: 90 capsule, Rfl: 3 ?  linaclotide (LINZESS) 145 MCG CAPS capsule, Take 1 capsule (145 mcg total) by mouth daily., Disp: 90 capsule, Rfl: 1 ?  metoprolol succinate (TOPROL-XL) 50 MG 24 hr tablet, Take 1 tablet (50 mg total) by mouth daily. Take with or immediately following a meal., Disp: 90 tablet, Rfl: 3 ?  olmesartan (BENICAR) 40 MG tablet, Take 1 tablet (40 mg total) by mouth daily., Disp: 90 tablet, Rfl: 3 ?  ondansetron (ZOFRAN-ODT) 4 MG disintegrating tablet, Take 1 tablet (4 mg total) by mouth every 8 (eight)  hours as needed for nausea or vomiting., Disp: 20 tablet, Rfl: 0 ?  pantoprazole (PROTONIX) 40 MG tablet, Take 1 tablet (40 mg total) by mouth 2 (two) times daily., Disp: 180 tablet, Rfl: 3 ?  potassium chloride (KLOR-CON) 10 MEQ tablet, Take 2 tablets (20 mEq total) by mouth daily., Disp: 180 tablet, Rfl: 3 ?  rosuvastatin (CRESTOR) 10 MG tablet, Take 1 tablet (10 mg total) by mouth daily., Disp: 90 tablet, Rfl: 3 ?  sertraline (ZOLOFT) 100 MG tablet, Take 2 tablets (200 mg total) by mouth daily., Disp: 180 tablet, Rfl: 3 ?  tizanidine (ZANAFLEX) 2 MG capsule, Take 1 capsule (2 mg total) by mouth 3 (three) times daily as needed for muscle spasms., Disp: 90 capsule, Rfl: 1 ?  triamcinolone (NASACORT) 55 MCG/ACT AERO nasal inhaler, Place 2 sprays into the nose daily., Disp: 1 Inhaler, Rfl: 12 ? ?Observations/Objective: ?Patient is well-developed, well-nourished in no acute distress.  ?Resting comfortably at home.  ?Head is normocephalic, atraumatic.  ?No labored breathing. ?Speech is  clear and coherent with logical content.  ?Patient is alert and oriented at baseline.  ? ?Assessment and Plan: ?1. Acute left-sided low back pain with left-sided sciatica ?- methylPREDNISolone (MEDROL DOSEPAK) 4 MG TBPK tablet; Take following package directions  Dispense: 21 tablet; Refill: 0 ? ?No alarm signs/symptoms present. Start Medrol dose pack along with OTC Tylenol. She can resume her Zanaflex, taking as directed. Heating pads for 10-15 minute intervals reviewed. Start stretching exercises provided as symptoms are resolving and periodically as maintenance to prevent recurrence. Strict in-person evaluation precautions discussed. Work note provided.  ? ?Follow Up Instructions: ?I discussed the assessment and treatment plan with the patient. The patient was provided an opportunity to ask questions and all were answered. The patient agreed with the plan and demonstrated an understanding of the instructions.  A copy of instructions  were sent to the patient via MyChart unless otherwise noted below.  ? ?The patient was advised to call back or seek an in-person evaluation if the symptoms worsen or if the condition fails to improve as

## 2021-11-24 NOTE — Patient Instructions (Signed)
?[name], thank you for joining [name], PA-C for today's virtual visit.  While this provider is not your primary care provider (PCP), if your PCP is located in our provider database this encounter information will be shared with them immediately following your visit. ? ?Consent: ?(Patient) [name] provided verbal consent for this virtual visit at the beginning of the encounter. ? ?Current Medications: ? ?Current Outpatient Medications:  ?  amLODipine (NORVASC) 5 MG tablet, Take 1 tablet (5 mg total) by mouth daily., Disp: 90 tablet, Rfl: 3 ?  clonazePAM (KLONOPIN) 0.5 MG tablet, Take 1 tablet by mouth twice daily as needed for anxiety, Disp: 60 tablet, Rfl: 2 ?  cloNIDine (CATAPRES - DOSED IN MG/24 HR) 0.1 mg/24hr patch, Place 1 patch (0.1 mg total) onto the skin once a week., Disp: 4 patch, Rfl: 12 ?  famotidine (PEPCID) 20 MG tablet, Take 1 tablet (20 mg total) by mouth 2 (two) times daily., Disp: 180 tablet, Rfl: 3 ?  fexofenadine (ALLEGRA) 180 MG tablet, Take 1 tablet by mouth once daily, Disp: 90 tablet, Rfl: 0 ?  Fluocinolone Acetonide Body 0.01 % OIL, Apply 1 application topically 2 (two) times daily as needed (flaking of scalp). , Disp: , Rfl: 11 ?  Fluticasone-Salmeterol (ADVAIR DISKUS) 250-50 MCG/DOSE AEPB, Inhale 1 puff into the lungs 2 (two) times daily., Disp: 3 each, Rfl: 1 ?  hydrochlorothiazide (MICROZIDE) 12.5 MG capsule, Take 1 capsule (12.5 mg total) by mouth daily., Disp: 90 capsule, Rfl: 3 ?  linaclotide (LINZESS) 145 MCG CAPS capsule, Take 1 capsule (145 mcg total) by mouth daily., Disp: 90 capsule, Rfl: 1 ?  metoprolol succinate (TOPROL-XL) 50 MG 24 hr tablet, Take 1 tablet (50 mg total) by mouth daily. Take with or immediately following a meal., Disp: 90 tablet, Rfl: 3 ?  olmesartan (BENICAR) 40 MG tablet, Take 1 tablet (40 mg total) by mouth daily., Disp: 90 tablet, Rfl: 3 ?  ondansetron (ZOFRAN-ODT) 4 MG disintegrating tablet, Take 1 tablet (4 mg total)  by mouth every 8 (eight) hours as needed for nausea or vomiting., Disp: 20 tablet, Rfl: 0 ?  pantoprazole (PROTONIX) 40 MG tablet, Take 1 tablet (40 mg total) by mouth 2 (two) times daily., Disp: 180 tablet, Rfl: 3 ?  potassium chloride (KLOR-CON) 10 MEQ tablet, Take 2 tablets (20 mEq total) by mouth daily., Disp: 180 tablet, Rfl: 3 ?  rosuvastatin (CRESTOR) 10 MG tablet, Take 1 tablet (10 mg total) by mouth daily., Disp: 90 tablet, Rfl: 3 ?  sertraline (ZOLOFT) 100 MG tablet, Take 2 tablets (200 mg total) by mouth daily., Disp: 180 tablet, Rfl: 3 ?  tizanidine (ZANAFLEX) 2 MG capsule, Take 1 capsule (2 mg total) by mouth 3 (three) times daily as needed for muscle spasms., Disp: 90 capsule, Rfl: 1 ?  triamcinolone (NASACORT) 55 MCG/ACT AERO nasal inhaler, Place 2 sprays into the nose daily., Disp: 1 Inhaler, Rfl: 12  ? ?Medications ordered in this encounter:  ?No orders of the defined types were placed in this encounter. ?  ? ?*If you need refills on other medications prior to your next appointment, please contact your pharmacy* ? ?Follow-Up: ?Call back or seek an in-person evaluation if the symptoms worsen or if the condition fails to improve as anticipated. ? ?Other Instructions ?Please avoid heavy lifting or overexertion. ?Take the steroid taper as directed along with OTC Tylenol for breakthrough pain. ?Resume your Zanaflex, taking no more than as directed. ?Remember this can make you  sleepy so no driving or operating heavy machinery while taking medication.  ?Heating pads for 10-15 minutes, a few times per day. ?As things are resolving, start the stretches below: ? ?Sciatica Rehab ?Ask your health care provider which exercises are safe for you. Do exercises exactly as told by your health care provider and adjust them as directed. It is normal to feel mild stretching, pulling, tightness, or discomfort as you do these exercises. Stop right away if you feel sudden pain or your pain gets worse. Do not begin these  exercises until told by your health care provider. ?Stretching and range-of-motion exercises ?These exercises warm up your muscles and joints and improve the movement and flexibility of your hips and back. These exercises also help to relieve pain, numbness, and tingling. ?Sciatic nerve glide ?Sit in a chair with your head facing down toward your chest. Place your hands behind your back. Let your shoulders slump forward. ?Slowly straighten one of your legs while you tilt your head back as if you are looking toward the ceiling. Only straighten your leg as far as you can without making your symptoms worse. ?Hold this position for __________ seconds. ?Slowly return to the starting position. ?Repeat with your other leg. ?Repeat __________ times. Complete this exercise __________ times a day. ?Knee to chest with hip adduction and internal rotation ? ?Lie on your back on a firm surface with both legs straight. ?Bend one of your knees and move it up toward your chest until you feel a gentle stretch in your lower back and buttock. Then, move your knee toward the shoulder that is on the opposite side from your leg. This is hip adduction and internal rotation. ?Hold your leg in this position by holding on to the front of your knee. ?Hold this position for __________ seconds. ?Slowly return to the starting position. ?Repeat with your other leg. ?Repeat __________ times. Complete this exercise __________ times a day. ?Prone extension on elbows ? ?Lie on your abdomen on a firm surface. A bed may be too soft for this exercise. ?Prop yourself up on your elbows. ?Use your arms to help lift your chest up until you feel a gentle stretch in your abdomen and your lower back. ?This will place some of your body weight on your elbows. If this is uncomfortable, try stacking pillows under your chest. ?Your hips should stay down, against the surface that you are lying on. Keep your hip and back muscles relaxed. ?Hold this position for  __________ seconds. ?Slowly relax your upper body and return to the starting position. ?Repeat __________ times. Complete this exercise __________ times a day. ?Strengthening exercises ?These exercises build strength and endurance in your back. Endurance is the ability to use your muscles for a long time, even after they get tired. ?Pelvic tilt ?This exercise strengthens the muscles that lie deep in the abdomen. ?Lie on your back on a firm surface. Bend your knees and keep your feet flat on the floor. ?Tense your abdominal muscles. Tip your pelvis up toward the ceiling and flatten your lower back into the floor. ?To help with this exercise, you may place a small towel under your lower back and try to push your back into the towel. ?Hold this position for __________ seconds. ?Let your muscles relax completely before you repeat this exercise. ?Repeat __________ times. Complete this exercise __________ times a day. ?Alternating arm and leg raises ? ?Get on your hands and knees on a firm surface. If you are on  a hard floor, you may want to use padding, such as an exercise mat, to cushion your knees. ?Line up your arms and legs. Your hands should be directly below your shoulders, and your knees should be directly below your hips. ?Lift your left leg behind you. At the same time, raise your right arm and straighten it in front of you. ?Do not lift your leg higher than your hip. ?Do not lift your arm higher than your shoulder. ?Keep your abdominal and back muscles tight. ?Keep your hips facing the ground. ?Do not arch your back. ?Keep your balance carefully, and do not hold your breath. ?Hold this position for __________ seconds. ?Slowly return to the starting position. ?Repeat with your right leg and your left arm. ?Repeat __________ times. Complete this exercise __________ times a day. ?Posture and body mechanics ?Good posture and healthy body mechanics can help to relieve stress in your body's tissues and joints. Body  mechanics refers to the movements and positions of your body while you do your daily activities. Posture is part of body mechanics. Good posture means: ?Your spine is in its natural S-curve position (neutral)

## 2021-11-26 ENCOUNTER — Other Ambulatory Visit: Payer: Self-pay | Admitting: Internal Medicine

## 2021-11-26 NOTE — Telephone Encounter (Signed)
Please refill as per office routine med refill policy (all routine meds to be refilled for 3 mo or monthly (per pt preference) up to one year from last visit, then month to month grace period for 3 mo, then further med refills will have to be denied) ? ?

## 2021-11-30 ENCOUNTER — Encounter: Payer: Self-pay | Admitting: Internal Medicine

## 2021-11-30 DIAGNOSIS — R937 Abnormal findings on diagnostic imaging of other parts of musculoskeletal system: Secondary | ICD-10-CM

## 2021-11-30 DIAGNOSIS — M545 Low back pain, unspecified: Secondary | ICD-10-CM

## 2021-12-02 ENCOUNTER — Other Ambulatory Visit: Payer: Self-pay | Admitting: Internal Medicine

## 2021-12-02 ENCOUNTER — Ambulatory Visit: Payer: BC Managed Care – PPO | Admitting: Internal Medicine

## 2021-12-02 NOTE — Telephone Encounter (Signed)
Please refill as per office routine med refill policy (all routine meds to be refilled for 3 mo or monthly (per pt preference) up to one year from last visit, then month to month grace period for 3 mo, then further med refills will have to be denied) ? ?

## 2022-01-20 ENCOUNTER — Ambulatory Visit: Payer: BC Managed Care – PPO | Admitting: Internal Medicine

## 2022-01-21 ENCOUNTER — Ambulatory Visit: Payer: BC Managed Care – PPO

## 2022-01-21 ENCOUNTER — Other Ambulatory Visit: Payer: Self-pay | Admitting: Internal Medicine

## 2022-01-21 DIAGNOSIS — Z1231 Encounter for screening mammogram for malignant neoplasm of breast: Secondary | ICD-10-CM

## 2022-01-22 ENCOUNTER — Ambulatory Visit
Admission: RE | Admit: 2022-01-22 | Discharge: 2022-01-22 | Disposition: A | Discharge status: Home / Self Care | Payer: BC Managed Care – PPO | Source: Ambulatory Visit | Attending: Internal Medicine | Admitting: Internal Medicine

## 2022-01-22 DIAGNOSIS — Z1231 Encounter for screening mammogram for malignant neoplasm of breast: Secondary | ICD-10-CM

## 2022-02-04 ENCOUNTER — Encounter: Payer: Self-pay | Admitting: Internal Medicine

## 2022-02-09 ENCOUNTER — Ambulatory Visit: Payer: BC Managed Care – PPO | Admitting: Internal Medicine

## 2022-02-18 ENCOUNTER — Other Ambulatory Visit: Payer: Self-pay | Admitting: Internal Medicine

## 2022-02-18 NOTE — Telephone Encounter (Signed)
Please refill as per office routine med refill policy (all routine meds to be refilled for 3 mo or monthly (per pt preference) up to one year from last visit, then month to month grace period for 3 mo, then further med refills will have to be denied) ? ?

## 2022-02-23 ENCOUNTER — Encounter: Payer: Self-pay | Admitting: Internal Medicine

## 2022-03-02 ENCOUNTER — Ambulatory Visit: Payer: BC Managed Care – PPO | Admitting: Internal Medicine

## 2022-03-09 ENCOUNTER — Other Ambulatory Visit: Payer: Self-pay | Admitting: Internal Medicine

## 2022-04-12 ENCOUNTER — Ambulatory Visit (INDEPENDENT_AMBULATORY_CARE_PROVIDER_SITE_OTHER): Payer: BC Managed Care – PPO | Admitting: Internal Medicine

## 2022-04-12 ENCOUNTER — Other Ambulatory Visit (INDEPENDENT_AMBULATORY_CARE_PROVIDER_SITE_OTHER): Payer: BC Managed Care – PPO

## 2022-04-12 ENCOUNTER — Encounter: Payer: Self-pay | Admitting: Internal Medicine

## 2022-04-12 VITALS — BP 182/108 | HR 73 | Temp 98.3°F | Ht 62.0 in | Wt 185.0 lb

## 2022-04-12 DIAGNOSIS — R7303 Prediabetes: Secondary | ICD-10-CM

## 2022-04-12 DIAGNOSIS — E669 Obesity, unspecified: Secondary | ICD-10-CM | POA: Diagnosis not present

## 2022-04-12 DIAGNOSIS — I10 Essential (primary) hypertension: Secondary | ICD-10-CM

## 2022-04-12 DIAGNOSIS — E7800 Pure hypercholesterolemia, unspecified: Secondary | ICD-10-CM | POA: Diagnosis not present

## 2022-04-12 DIAGNOSIS — E559 Vitamin D deficiency, unspecified: Secondary | ICD-10-CM

## 2022-04-12 DIAGNOSIS — Z23 Encounter for immunization: Secondary | ICD-10-CM | POA: Diagnosis not present

## 2022-04-12 DIAGNOSIS — I872 Venous insufficiency (chronic) (peripheral): Secondary | ICD-10-CM

## 2022-04-12 LAB — LIPID PANEL
Cholesterol: 213 mg/dL — ABNORMAL HIGH (ref 0–200)
HDL: 46.7 mg/dL (ref >39.00)
LDL Cholesterol: 145 mg/dL — ABNORMAL HIGH (ref 0–99)
NonHDL: 165.86
Total CHOL/HDL Ratio: 5
Triglycerides: 102 mg/dL (ref 0.0–149.0)
VLDL: 20.4 mg/dL (ref 0.0–40.0)

## 2022-04-12 LAB — BASIC METABOLIC PANEL
BUN: 10 mg/dL (ref 6–23)
CO2: 32 mEq/L (ref 19–32)
Calcium: 9.6 mg/dL (ref 8.4–10.5)
Chloride: 102 mEq/L (ref 96–112)
Creatinine, Ser: 0.76 mg/dL (ref 0.40–1.20)
GFR: 89.22 mL/min (ref >60.00)
Glucose, Bld: 98 mg/dL (ref 70–99)
Potassium: 5 mEq/L (ref 3.5–5.1)
Sodium: 142 mEq/L (ref 135–145)

## 2022-04-12 LAB — HEPATIC FUNCTION PANEL
ALT: 7 U/L (ref 0–35)
AST: 14 U/L (ref 0–37)
Albumin: 4.5 g/dL (ref 3.5–5.2)
Alkaline Phosphatase: 58 U/L (ref 39–117)
Bilirubin, Direct: 0.1 mg/dL (ref 0.0–0.3)
Total Bilirubin: 0.6 mg/dL (ref 0.2–1.2)
Total Protein: 8.2 g/dL (ref 6.0–8.3)

## 2022-04-12 MED ORDER — HYDROCHLOROTHIAZIDE 25 MG PO TABS: 25.0000 mg | ORAL_TABLET | Freq: Every day | ORAL | 3 refills | Status: DC

## 2022-04-12 NOTE — Assessment & Plan Note (Signed)
Pt plans to start walking, for bariatric surgury referral, diet training

## 2022-04-12 NOTE — Progress Notes (Signed)
Patient ID: [name], female   DOB: 1965-10-31, 56 y.o.   MRN: [id number]        Chief Complaint: follow up HTN, HLD and PreDM,, obesity       HPI:  [name] is a 56 y.o. female here with moderately elevated persistent HTN in the past several months despite good compliance with meds; has seen [facility] and sched for Echo and stress tetsting oct 25.  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  Worse at end of day, better when wakes up.  Cardiology suggested consider increased hct to 25 mg per pt.  Also requests referral to Bariatric surgury and DM education, as her HTN, PreDM only likely to worsen in future without other intervention; plans to start walking more but working sometime 10-12 hr days.  Has tried in past - phentermine, wt watchers, Herbalife, and Noom without wt loss.   Wt Readings from Last 3 Encounters:  04/12/22 185 lb (83.9 kg)  08/27/21 182 lb (82.6 kg)  04/10/21 180 lb (81.6 kg)   BP Readings from Last 3 Encounters:  04/12/22 (!) 182/108  08/27/21 (!) 142/80  04/10/21 (!) 156/88         Past Medical History:  Diagnosis Date   Allergic rhinitis    Allergy    Anxiety    Depression    GERD (gastroesophageal reflux disease)    Hyperlipidemia    Hyperplastic colon polyp 10/2003   Hypertension    Menorrhagia    Obesity    PONV (postoperative nausea and vomiting)    RLS (restless legs syndrome)    Past Surgical History:  Procedure Laterality Date   COLONOSCOPY     CYSTOSCOPY N/A 10/12/2016   Procedure: CYSTOSCOPY;  Surgeon: Anastasio Auerbach, MD;  Location: [facility];  Service: Gynecology;  Laterality: N/A;   ENDOMETRIAL ABLATION  2007   Dr. Landry Mellow at [place] WITH SALPINGECTOMY Bilateral 10/12/2016   Procedure: LAPAROSCOPIC ASSISTED VAGINAL HYSTERECTOMY WITH SALPINGECTOMY;  Surgeon: Anastasio Auerbach, MD;  Location: [facility];  Service:  Gynecology;  Laterality: Bilateral;  request to follow first case at around 10:30am.  Requests 2 1/2 hours for the case.   SALIVARY GLAND SURGERY  12-11   LEFT SIDE NODULE REMOVED WAS BENIGN   TUBAL LIGATION      reports that she has never smoked. She has never used smokeless tobacco. She reports that she does not currently use alcohol. She reports that she does not use drugs. family history includes Breast cancer in her maternal aunt; Cancer in her mother; Colon cancer in her mother; Colon polyps in her father; Heart attack in her maternal grandfather; Hyperlipidemia in an other family member; Hypertension in an other family member. Allergies  Allergen Reactions   Elemental Sulfur Hives   Other     POLLEN AND DUST   Current Outpatient Medications on File Prior to Visit  Medication Sig Dispense Refill   amLODipine (NORVASC) 5 MG tablet Take 1 tablet (5 mg total) by mouth daily. 90 tablet 3   clonazePAM (KLONOPIN) 0.5 MG tablet Take 1 tablet by mouth twice daily as needed for anxiety 60 tablet 0   estradiol (VIVELLE-DOT) 0.075 MG/24HR 1 patch 2 (two) times a week.     famotidine (PEPCID) 20 MG tablet Take 1 tablet (20 mg total) by mouth 2 (two) times daily. 180 tablet 3  fexofenadine (ALLEGRA) 180 MG tablet Take 1 tablet by mouth once daily 90 tablet 0   Fluocinolone Acetonide Body 0.01 % OIL Apply 1 application topically 2 (two) times daily as needed (flaking of scalp).   11   linaclotide (LINZESS) 145 MCG CAPS capsule Take 1 capsule by mouth once daily 90 capsule 2   metoprolol succinate (TOPROL-XL) 50 MG 24 hr tablet Take 1 tablet (50 mg total) by mouth daily. Take with or immediately following a meal. 90 tablet 3   olmesartan (BENICAR) 40 MG tablet Take 1 tablet (40 mg total) by mouth daily. 90 tablet 3   ondansetron (ZOFRAN-ODT) 4 MG disintegrating tablet Take 1 tablet (4 mg total) by mouth every 8 (eight) hours as needed for nausea or vomiting. 20 tablet 0   pantoprazole (PROTONIX)  40 MG tablet Take 1 tablet (40 mg total) by mouth 2 (two) times daily. 180 tablet 3   potassium chloride (KLOR-CON) 10 MEQ tablet Take 2 tablets by mouth once daily 180 tablet 2   rosuvastatin (CRESTOR) 10 MG tablet Take 1 tablet (10 mg total) by mouth daily. 90 tablet 3   [DISCONTINUED] fluticasone (FLONASE) 50 MCG/ACT nasal spray 2 sprays by Nasal route daily. 16 g 2   No current facility-administered medications on file prior to visit.        ROS:  All others reviewed and negative.  Objective        PE:  BP (!) 182/108 (BP Location: Left Arm, Patient Position: Sitting, Cuff Size: Large)   Pulse 73   Temp 98.3 F (36.8 C) (Oral)   Ht '5\' 2"'$  (1.575 m)   Wt 185 lb (83.9 kg)   LMP 10/01/2016   SpO2 96%   BMI 33.84 kg/m                 Constitutional: Pt appears in NAD               HENT: Head: NCAT.                Right Ear: External ear normal.                 Left Ear: External ear normal.                Eyes: . Pupils are equal, round, and reactive to light. Conjunctivae and EOM are normal               Nose: without d/c or deformity               Neck: Neck supple. Gross normal ROM               Cardiovascular: Normal rate and regular rhythm.                 Pulmonary/Chest: Effort normal and breath sounds without rales or wheezing.                Abd:  Soft, NT, ND, + BS, no organomegaly               Neurological: Pt is alert. At baseline orientation, motor grossly intact               Skin: Skin is warm. No rashes, no other new lesions, LE edema - trace edema LLE               Psychiatric: Pt behavior is normal without agitation   Micro: none  Cardiac tracings  I have personally interpreted today:  none  Pertinent Radiological findings (summarize): none   Lab Results  Component Value Date   WBC 7.2 08/27/2021   HGB 12.9 08/27/2021   HCT 39.0 08/27/2021   PLT 188.0 08/27/2021   GLUCOSE 87 08/27/2021   CHOL 157 08/27/2021   TRIG 86.0 08/27/2021   HDL 51.40  08/27/2021   LDLDIRECT 156.0 04/17/2013   LDLCALC 88 08/27/2021   ALT 15 08/27/2021   AST 22 08/27/2021   NA 142 08/27/2021   K 3.6 08/27/2021   CL 100 08/27/2021   CREATININE 0.77 08/27/2021   BUN 14 08/27/2021   CO2 37 (H) 08/27/2021   TSH 0.94 08/27/2021   HGBA1C 6.0 08/27/2021   MICROALBUR 1.6 08/27/2021   Assessment/Plan:  Farran Amsden is a 56 y.o. Black or African American [2] female with  has a past medical history of Allergic rhinitis, Allergy, Anxiety, Depression, GERD (gastroesophageal reflux disease), Hyperlipidemia, Hyperplastic colon polyp (10/2003), Hypertension, Menorrhagia, Obesity, PONV (postoperative nausea and vomiting), and RLS (restless legs syndrome).  Vitamin D deficiency Last vitamin D Lab Results  Component Value Date   VD25OH 48.36 08/27/2021   Stable, cont oral replacement   Venous insufficiency Hopefully to improve as well with increased hct, also for low salt, leg elevation, exercise, wt loss  Hyperlipidemia Lab Results  Component Value Date   LDLCALC 88 08/27/2021   Stable, pt to continue current statin crestor 10 mg qd   Obesity (BMI 30.0-34.9) Pt plans to start walking, for bariatric surgury referral, diet training  Prediabetes Lab Results  Component Value Date   HGBA1C 6.0 08/27/2021   Stable, pt to continue current medical treatment - refer DM education    Primary hypertension BP Readings from Last 3 Encounters:  04/12/22 (!) 182/108  08/27/21 (!) 142/80  04/10/21 (!) 156/88   uncontrolled, pt to continue medical treatment benicar 40 mg qd, toprol xl 50 qd, norvasc 5 qd, and increase hct to 25 mg qd   Followup: Return in about 6 months (around 10/11/2022).  [name], MD 04/12/2022 8:14 PM [facility]

## 2022-04-12 NOTE — Assessment & Plan Note (Signed)
Lab Results  Component Value Date   LDLCALC 88 08/27/2021   Stable, pt to continue current statin crestor 10 mg qd

## 2022-04-12 NOTE — Assessment & Plan Note (Signed)
Lab Results  Component Value Date   HGBA1C 6.0 08/27/2021   Stable, pt to continue current medical treatment - refer DM education

## 2022-04-12 NOTE — Patient Instructions (Signed)
You had the flu shot today  Ok to increase the HCT fluid pill to 25 mg per day  Please continue all other medications as before, and refills have been done if requested.  Please have the pharmacy call with any other refills you may need.  Please continue your efforts at being more active, low cholesterol diet, and weight control.  You are otherwise up to date with prevention measures today.  Please keep your appointments with your specialists as you may have planned - the stress test and Echo with [facility] on Oct 25.  You will be contacted regarding the referral for: Bariatric Surgury evaluation, and Diet education  Please go to the LAB at the blood drawing area for the tests to be done  You will be contacted by phone if any changes need to be made immediately.  Otherwise, you will receive a letter about your results with an explanation, but please check with MyChart first.  Please remember to sign up for MyChart if you have not done so, as this will be important to you in the future with finding out test results, communicating by private email, and scheduling acute appointments online when needed.  Please make an Appointment to return in 6 months, or sooner if needed

## 2022-04-12 NOTE — Assessment & Plan Note (Signed)
BP Readings from Last 3 Encounters:  04/12/22 (!) 182/108  08/27/21 (!) 142/80  04/10/21 (!) 156/88   uncontrolled, pt to continue medical treatment benicar 40 mg qd, toprol xl 50 qd, norvasc 5 qd, and increase hct to 25 mg qd

## 2022-04-12 NOTE — Assessment & Plan Note (Signed)
Hopefully to improve as well with increased hct, also for low salt, leg elevation, exercise, wt loss

## 2022-04-12 NOTE — Assessment & Plan Note (Signed)
Last vitamin D Lab Results  Component Value Date   VD25OH 48.36 08/27/2021   Stable, cont oral replacement

## 2022-04-13 ENCOUNTER — Encounter: Payer: Self-pay | Admitting: Internal Medicine

## 2022-04-13 LAB — HEMOGLOBIN A1C: Hgb A1c MFr Bld: 6 % (ref 4.6–6.5)

## 2022-04-15 ENCOUNTER — Other Ambulatory Visit: Payer: Self-pay | Admitting: Internal Medicine

## 2022-04-15 NOTE — Telephone Encounter (Signed)
Please refill as per office routine med refill policy (all routine meds to be refilled for 3 mo or monthly (per pt preference) up to one year from last visit, then month to month grace period for 3 mo, then further med refills will have to be denied) ? ?

## 2022-04-16 ENCOUNTER — Encounter: Payer: BC Managed Care – PPO | Admitting: Internal Medicine

## 2022-04-22 ENCOUNTER — Encounter: Payer: Self-pay | Admitting: Internal Medicine

## 2022-05-10 ENCOUNTER — Encounter: Payer: BC Managed Care – PPO | Admitting: Dietician

## 2022-05-11 ENCOUNTER — Other Ambulatory Visit: Payer: Self-pay | Admitting: Internal Medicine

## 2022-05-12 NOTE — Telephone Encounter (Signed)
Please refill as per office routine med refill policy (all routine meds to be refilled for 3 mo or monthly (per pt preference) up to one year from last visit, then month to month grace period for 3 mo, then further med refills will have to be denied) ? ?

## 2022-05-27 ENCOUNTER — Telehealth: Payer: Self-pay

## 2022-05-27 NOTE — Telephone Encounter (Signed)
Transition Care Management Unsuccessful Follow-up Telephone Call  Date of discharge and from where:  10/11/2[facility] in [place]  Attempts:  1st Attempt  Reason for unsuccessful TCM follow-up call:  Unable to leave message

## 2022-06-07 ENCOUNTER — Encounter: Payer: Self-pay | Admitting: Internal Medicine

## 2022-06-09 MED ORDER — POTASSIUM CHLORIDE ER 10 MEQ PO TBCR: 20.0000 meq | EXTENDED_RELEASE_TABLET | Freq: Every day | ORAL | 3 refills | Status: DC

## 2022-06-09 NOTE — Telephone Encounter (Signed)
Patient was informed by specialist to increase potassium and has started doing so but wants your feed back. Please advise

## 2022-06-25 ENCOUNTER — Telehealth: Payer: BC Managed Care – PPO | Admitting: Physician Assistant

## 2022-06-25 ENCOUNTER — Telehealth: Payer: BC Managed Care – PPO

## 2022-06-25 ENCOUNTER — Other Ambulatory Visit: Payer: Self-pay | Admitting: Internal Medicine

## 2022-06-25 ENCOUNTER — Encounter: Payer: Self-pay | Admitting: Internal Medicine

## 2022-06-25 DIAGNOSIS — B379 Candidiasis, unspecified: Secondary | ICD-10-CM | POA: Diagnosis not present

## 2022-06-25 DIAGNOSIS — T3695XA Adverse effect of unspecified systemic antibiotic, initial encounter: Secondary | ICD-10-CM

## 2022-06-25 DIAGNOSIS — R3989 Other symptoms and signs involving the genitourinary system: Secondary | ICD-10-CM

## 2022-06-25 MED ORDER — FLUCONAZOLE 200 MG PO TABS: 200.0000 mg | ORAL_TABLET | ORAL | 0 refills | Status: DC | PRN

## 2022-06-25 MED ORDER — OLMESARTAN MEDOXOMIL 40 MG PO TABS: 40.0000 mg | ORAL_TABLET | Freq: Every day | ORAL | 0 refills | Status: DC

## 2022-06-25 MED ORDER — CEPHALEXIN 500 MG PO CAPS: 500.0000 mg | ORAL_CAPSULE | Freq: Two times a day (BID) | ORAL | 0 refills | Status: DC

## 2022-06-25 NOTE — Patient Instructions (Signed)
[name], thank you for joining Mar Daring, PA-C for today's virtual visit.  While this provider is not your primary care provider (PCP), if your PCP is located in our provider database this encounter information will be shared with them immediately following your visit.   [facility] account gives you access to today's visit and all your visits, tests, and labs performed at [facility] " click here if you don't have a [facility] account or go to [email or web address]  Consent: (Patient) [name] provided verbal consent for this virtual visit at the beginning of the encounter.  Current Medications:  Current Outpatient Medications:    cephALEXin (KEFLEX) 500 MG capsule, Take 1 capsule (500 mg total) by mouth 2 (two) times daily., Disp: 14 capsule, Rfl: 0   fluconazole (DIFLUCAN) 200 MG tablet, Take 1 tablet (200 mg total) by mouth every 3 (three) days as needed., Disp: 2 tablet, Rfl: 0   amLODipine (NORVASC) 5 MG tablet, Take 1 tablet (5 mg total) by mouth daily., Disp: 90 tablet, Rfl: 3   clonazePAM (KLONOPIN) 0.5 MG tablet, Take 1 tablet by mouth twice daily as needed for anxiety, Disp: 60 tablet, Rfl: 0   estradiol (VIVELLE-DOT) 0.075 MG/24HR, 1 patch 2 (two) times a week., Disp: , Rfl:    famotidine (PEPCID) 20 MG tablet, Take 1 tablet (20 mg total) by mouth 2 (two) times daily., Disp: 180 tablet, Rfl: 3   fexofenadine (ALLEGRA) 180 MG tablet, Take 1 tablet by mouth once daily, Disp: 90 tablet, Rfl: 0   Fluocinolone Acetonide Body 0.01 % OIL, Apply 1 application topically 2 (two) times daily as needed (flaking of scalp). , Disp: , Rfl: 11   hydrochlorothiazide (HYDRODIURIL) 25 MG tablet, Take 1 tablet (25 mg total) by mouth daily., Disp: 90 tablet, Rfl: 3   linaclotide (LINZESS) 145 MCG CAPS capsule, Take 1 capsule by mouth once daily, Disp: 90 capsule, Rfl: 2   metoprolol succinate (TOPROL-XL) 50 MG 24 hr tablet, TAKE 1  TABLET BY MOUTH ONCE DAILY (TAKE  WITH  OR  IMMEDIATELY  FOLLOWING  A  MEAL), Disp: 90 tablet, Rfl: 0   olmesartan (BENICAR) 40 MG tablet, Take 1 tablet (40 mg total) by mouth daily., Disp: 90 tablet, Rfl: 0   ondansetron (ZOFRAN-ODT) 4 MG disintegrating tablet, Take 1 tablet (4 mg total) by mouth every 8 (eight) hours as needed for nausea or vomiting., Disp: 20 tablet, Rfl: 0   pantoprazole (PROTONIX) 40 MG tablet, Take 1 tablet (40 mg total) by mouth 2 (two) times daily., Disp: 180 tablet, Rfl: 3   potassium chloride (KLOR-CON) 10 MEQ tablet, Take 2 tablets (20 mEq total) by mouth daily., Disp: 360 tablet, Rfl: 3   rosuvastatin (CRESTOR) 10 MG tablet, Take 1 tablet (10 mg total) by mouth daily., Disp: 90 tablet, Rfl: 3   Medications ordered in this encounter:  Meds ordered this encounter  Medications   cephALEXin (KEFLEX) 500 MG capsule    Sig: Take 1 capsule (500 mg total) by mouth 2 (two) times daily.    Dispense:  14 capsule    Refill:  0    Order Specific Question:   Supervising Provider    Answer:   Chase Picket [[id number]]   fluconazole (DIFLUCAN) 200 MG tablet    Sig: Take 1 tablet (200 mg total) by mouth every 3 (three) days as needed.    Dispense:  2 tablet    Refill:  0  Order Specific Question:   Supervising Provider    Answer:   Chase Picket [[id number]]     *If you need refills on other medications prior to your next appointment, please contact your pharmacy*  Follow-Up: Call back or seek an in-person evaluation if the symptoms worsen or if the condition fails to improve as anticipated.  [facility] [phone]  Other Instructions Urinary Tract Infection, Adult  A urinary tract infection (UTI) is an infection of any part of the urinary tract. The urinary tract includes the kidneys, ureters, bladder, and urethra. These organs make, store, and get rid of urine in the body. An upper UTI affects the ureters and kidneys. A lower UTI affects the  bladder and urethra. What are the causes? Most urinary tract infections are caused by bacteria in your genital area around your urethra, where urine leaves your body. These bacteria grow and cause inflammation of your urinary tract. What increases the risk? You are more likely to develop this condition if: You have a urinary catheter that stays in place. You are not able to control when you urinate or have a bowel movement (incontinence). You are female and you: Use a spermicide or diaphragm for birth control. Have low estrogen levels. Are pregnant. You have certain genes that increase your risk. You are sexually active. You take antibiotic medicines. You have a condition that causes your flow of urine to slow down, such as: An enlarged prostate, if you are female. Blockage in your urethra. A kidney stone. A nerve condition that affects your bladder control (neurogenic bladder). Not getting enough to drink, or not urinating often. You have certain medical conditions, such as: Diabetes. A weak disease-fighting system (immunesystem). Sickle cell disease. Gout. Spinal cord injury. What are the signs or symptoms? Symptoms of this condition include: Needing to urinate right away (urgency). Frequent urination. This may include small amounts of urine each time you urinate. Pain or burning with urination. Blood in the urine. Urine that smells bad or unusual. Trouble urinating. Cloudy urine. Vaginal discharge, if you are female. Pain in the abdomen or the lower back. You may also have: Vomiting or a decreased appetite. Confusion. Irritability or tiredness. A fever or chills. Diarrhea. The first symptom in older adults may be confusion. In some cases, they may not have any symptoms until the infection has worsened. How is this diagnosed? This condition is diagnosed based on your medical history and a physical exam. You may also have other tests, including: Urine tests. Blood  tests. Tests for STIs (sexually transmitted infections). If you have had more than one UTI, a cystoscopy or imaging studies may be done to determine the cause of the infections. How is this treated? Treatment for this condition includes: Antibiotic medicine. Over-the-counter medicines to treat discomfort. Drinking enough water to stay hydrated. If you have frequent infections or have other conditions such as a kidney stone, you may need to see a health care provider who specializes in the urinary tract (urologist). In rare cases, urinary tract infections can cause sepsis. Sepsis is a life-threatening condition that occurs when the body responds to an infection. Sepsis is treated in the hospital with IV antibiotics, fluids, and other medicines. Follow these instructions at home:  Medicines Take over-the-counter and prescription medicines only as told by your health care provider. If you were prescribed an antibiotic medicine, take it as told by your health care provider. Do not stop using the antibiotic even if you start to feel  better. General instructions Make sure you: Empty your bladder often and completely. Do not hold urine for long periods of time. Empty your bladder after sex. Wipe from front to back after urinating or having a bowel movement if you are female. Use each tissue only one time when you wipe. Drink enough fluid to keep your urine pale yellow. Keep all follow-up visits. This is important. Contact a health care provider if: Your symptoms do not get better after 1-2 days. Your symptoms go away and then return. Get help right away if: You have severe pain in your back or your lower abdomen. You have a fever or chills. You have nausea or vomiting. Summary A urinary tract infection (UTI) is an infection of any part of the urinary tract, which includes the kidneys, ureters, bladder, and urethra. Most urinary tract infections are caused by bacteria in your genital  area. Treatment for this condition often includes antibiotic medicines. If you were prescribed an antibiotic medicine, take it as told by your health care provider. Do not stop using the antibiotic even if you start to feel better. Keep all follow-up visits. This is important. This information is not intended to replace advice given to you by your health care provider. Make sure you discuss any questions you have with your health care provider. Document Revised: 02/15/2020 Document Reviewed: 02/15/2020 Elsevier Patient Education  [place].    If you have been instructed to have an in-person evaluation today at a local Urgent Care facility, please use the link below. It will take you to a list of all of our available [facility], including address, phone number and hours of operation. Please do not delay care.  [facility]  If you or a family member do not have a primary care provider, use the link below to schedule a visit and establish care. When you choose a [facility] primary care physician or advanced practice provider, you gain a long-term partner in health. Find a Primary Care Provider  Learn more about [facility]'s in-office and virtual care options: [facility]

## 2022-06-25 NOTE — Progress Notes (Signed)
Virtual Visit Consent   [name], you are scheduled for a virtual visit with a [facility] provider today. Just as with appointments in the office, your consent must be obtained to participate. Your consent will be active for this visit and any virtual visit you may have with one of our providers in the next 365 days. If you have a MyChart account, a copy of this consent can be sent to you electronically.  As this is a virtual visit, video technology does not allow for your provider to perform a traditional examination. This may limit your provider's ability to fully assess your condition. If your provider identifies any concerns that need to be evaluated in person or the need to arrange testing (such as labs, EKG, etc.), we will make arrangements to do so. Although advances in technology are sophisticated, we cannot ensure that it will always work on either your end or our end. If the connection with a video visit is poor, the visit may have to be switched to a telephone visit. With either a video or telephone visit, we are not always able to ensure that we have a secure connection.  By engaging in this virtual visit, you consent to the provision of healthcare and authorize for your insurance to be billed (if applicable) for the services provided during this visit. Depending on your insurance coverage, you may receive a charge related to this service.  I need to obtain your verbal consent now. Are you willing to proceed with your visit today? [name] has provided verbal consent on 06/25/2022 for a virtual visit (video or telephone). Tara Daring, PA-C  Date: 06/25/2022 9:52 AM  Virtual Visit via Video Note   I, [name], connected with  [name]  ([id number], [birth year]) on 06/25/22 at  9:45 AM EST by a video-enabled telemedicine application and verified that I am speaking with the correct person using two identifiers.  Location: Patient: Virtual  Visit Location Patient: Mobile Provider: Virtual Visit Location Provider: Home Office   I discussed the limitations of evaluation and management by telemedicine and the availability of in person appointments. The patient expressed understanding and agreed to proceed.    History of Present Illness: [name] is a 56 y.o. who identifies as a female who was assigned female at birth, and is being seen today for possible UTI.  HPI: Urinary Tract Infection  This is a new problem. The current episode started in the past 7 days (going on for a while, but worsened over last 2 days). The problem has been gradually worsening. The quality of the pain is described as aching and burning. The pain is mild. There has been no fever. Associated symptoms include frequency, hesitancy and urgency. Pertinent negatives include no chills, discharge, flank pain, hematuria, nausea or vomiting. Associated symptoms comments: Low back pain, cloudy urine. She has tried increased fluids (cranberry juice) for the symptoms. The treatment provided no relief.      Problems:  Patient Active Problem List   Diagnosis Date Noted   Obesity (BMI 30.0-34.9) 04/12/2022   Vitamin D deficiency 04/10/2021   Prediabetes 10/12/2020   Venous insufficiency 10/12/2020   DOE (dyspnea on exertion) 06/24/2020   Primary hypertension 06/24/2020   Acute bilateral low back pain with bilateral sciatica 06/24/2020   Abnormal MRI, cervical spine 12/08/2019   Cervicalgia 12/08/2019   ETD (Eustachian tube dysfunction), bilateral 08/27/2019   Hearing loss 08/27/2019   B12 deficiency 06/02/2019  Vertigo 04/20/2019   Closed head injury 04/18/2019   Dysphagia 11/13/2018   Epigastric pain 11/13/2018   Anemia 11/13/2018   Asthma 10/10/2018   Neck pain on left side 03/21/2018   Nasal turbinate hypertrophy 05/17/2017   Deviated septum 04/19/2017   Bad breath 05/25/2016   Abnormal TSH 05/09/2015   Rhinitis, chronic 06/19/2014   Other  and unspecified ovarian cyst 05/30/2013   Chronic constipation 04/17/2013   Encounter for well adult exam with abnormal findings 10/12/2010   Hyperlipidemia 03/26/2007   Anxiety state 03/26/2007   Depression 03/26/2007   Chronic maxillary sinusitis 03/26/2007   GERD 03/26/2007   IBS 03/26/2007   COLONIC POLYPS, HX OF 03/26/2007    Allergies:  Allergies  Allergen Reactions   Elemental Sulfur Hives   Other     POLLEN AND DUST   Medications:  Current Outpatient Medications:    cephALEXin (KEFLEX) 500 MG capsule, Take 1 capsule (500 mg total) by mouth 2 (two) times daily., Disp: 14 capsule, Rfl: 0   fluconazole (DIFLUCAN) 200 MG tablet, Take 1 tablet (200 mg total) by mouth every 3 (three) days as needed., Disp: 2 tablet, Rfl: 0   amLODipine (NORVASC) 5 MG tablet, Take 1 tablet (5 mg total) by mouth daily., Disp: 90 tablet, Rfl: 3   clonazePAM (KLONOPIN) 0.5 MG tablet, Take 1 tablet by mouth twice daily as needed for anxiety, Disp: 60 tablet, Rfl: 0   estradiol (VIVELLE-DOT) 0.075 MG/24HR, 1 patch 2 (two) times a week., Disp: , Rfl:    famotidine (PEPCID) 20 MG tablet, Take 1 tablet (20 mg total) by mouth 2 (two) times daily., Disp: 180 tablet, Rfl: 3   fexofenadine (ALLEGRA) 180 MG tablet, Take 1 tablet by mouth once daily, Disp: 90 tablet, Rfl: 0   Fluocinolone Acetonide Body 0.01 % OIL, Apply 1 application topically 2 (two) times daily as needed (flaking of scalp). , Disp: , Rfl: 11   hydrochlorothiazide (HYDRODIURIL) 25 MG tablet, Take 1 tablet (25 mg total) by mouth daily., Disp: 90 tablet, Rfl: 3   linaclotide (LINZESS) 145 MCG CAPS capsule, Take 1 capsule by mouth once daily, Disp: 90 capsule, Rfl: 2   metoprolol succinate (TOPROL-XL) 50 MG 24 hr tablet, TAKE 1 TABLET BY MOUTH ONCE DAILY (TAKE  WITH  OR  IMMEDIATELY  FOLLOWING  A  MEAL), Disp: 90 tablet, Rfl: 0   olmesartan (BENICAR) 40 MG tablet, Take 1 tablet (40 mg total) by mouth daily., Disp: 90 tablet, Rfl: 0   ondansetron  (ZOFRAN-ODT) 4 MG disintegrating tablet, Take 1 tablet (4 mg total) by mouth every 8 (eight) hours as needed for nausea or vomiting., Disp: 20 tablet, Rfl: 0   pantoprazole (PROTONIX) 40 MG tablet, Take 1 tablet (40 mg total) by mouth 2 (two) times daily., Disp: 180 tablet, Rfl: 3   potassium chloride (KLOR-CON) 10 MEQ tablet, Take 2 tablets (20 mEq total) by mouth daily., Disp: 360 tablet, Rfl: 3   rosuvastatin (CRESTOR) 10 MG tablet, Take 1 tablet (10 mg total) by mouth daily., Disp: 90 tablet, Rfl: 3  Observations/Objective: Patient is well-developed, well-nourished in no acute distress.  Resting comfortably  Head is normocephalic, atraumatic.  No labored breathing.  Speech is clear and coherent with logical content.  Patient is alert and oriented at baseline.    Assessment and Plan: 1. Suspected UTI - cephALEXin (KEFLEX) 500 MG capsule; Take 1 capsule (500 mg total) by mouth 2 (two) times daily.  Dispense: 14 capsule; Refill: 0  2.  Antibiotic-induced yeast infection - fluconazole (DIFLUCAN) 200 MG tablet; Take 1 tablet (200 mg total) by mouth every 3 (three) days as needed.  Dispense: 2 tablet; Refill: 0  - Worsening symptoms.  - Will treat empirically with Keflex - May use AZO for bladder spasms - Continue to push fluids.  - Diflucan given as prophylaxis as patient tends to get vaginal yeast infections with antibiotic use - Seek in person evaluation for urine culture if symptoms do not improve or if they worsen.    Follow Up Instructions: I discussed the assessment and treatment plan with the patient. The patient was provided an opportunity to ask questions and all were answered. The patient agreed with the plan and demonstrated an understanding of the instructions.  A copy of instructions were sent to the patient via MyChart unless otherwise noted below.    The patient was advised to call back or seek an in-person evaluation if the symptoms worsen or if the condition fails to  improve as anticipated.  Time:  I spent 8 minutes with the patient via telehealth technology discussing the above problems/concerns.    Tara Daring, PA-C

## 2022-07-02 ENCOUNTER — Encounter: Payer: Self-pay | Admitting: Internal Medicine

## 2022-07-05 ENCOUNTER — Other Ambulatory Visit: Payer: Self-pay

## 2022-07-05 MED ORDER — AMLODIPINE BESYLATE 5 MG PO TABS: 5.0000 mg | ORAL_TABLET | Freq: Every day | ORAL | 3 refills | Status: DC

## 2022-07-27 ENCOUNTER — Encounter: Payer: Self-pay | Admitting: Internal Medicine

## 2022-08-09 ENCOUNTER — Other Ambulatory Visit: Payer: Self-pay | Admitting: Internal Medicine

## 2022-08-09 ENCOUNTER — Other Ambulatory Visit: Payer: Self-pay

## 2022-08-09 NOTE — Telephone Encounter (Signed)
Please refill as per office routine med refill policy (all routine meds to be refilled for 3 mo or monthly (per pt preference) up to one year from last visit, then month to month grace period for 3 mo, then further med refills will have to be denied)

## 2022-08-15 ENCOUNTER — Other Ambulatory Visit: Payer: Self-pay | Admitting: Internal Medicine

## 2022-08-15 ENCOUNTER — Encounter: Payer: Self-pay | Admitting: Internal Medicine

## 2022-08-16 ENCOUNTER — Other Ambulatory Visit: Payer: Self-pay

## 2022-08-16 MED ORDER — CLONAZEPAM 0.5 MG PO TABS: 0.5000 mg | ORAL_TABLET | Freq: Two times a day (BID) | ORAL | 0 refills | Status: DC | PRN

## 2022-08-16 MED ORDER — OLMESARTAN MEDOXOMIL 40 MG PO TABS: 40.0000 mg | ORAL_TABLET | Freq: Every day | ORAL | 0 refills | Status: DC

## 2022-08-16 MED ORDER — ROSUVASTATIN CALCIUM 10 MG PO TABS: 10.0000 mg | ORAL_TABLET | Freq: Every day | ORAL | 0 refills | Status: DC

## 2022-08-16 MED ORDER — METOPROLOL SUCCINATE ER 50 MG PO TB24: 50.0000 mg | ORAL_TABLET | Freq: Every day | ORAL | 0 refills | Status: DC

## 2022-09-04 ENCOUNTER — Other Ambulatory Visit: Payer: Self-pay | Admitting: Internal Medicine

## 2022-09-16 ENCOUNTER — Ambulatory Visit (INDEPENDENT_AMBULATORY_CARE_PROVIDER_SITE_OTHER): Payer: BC Managed Care – PPO | Admitting: Behavioral Health

## 2022-09-16 ENCOUNTER — Encounter: Payer: Self-pay | Admitting: Behavioral Health

## 2022-09-16 VITALS — BP 179/110 | HR 82 | Ht 61.0 in | Wt 189.0 lb

## 2022-09-16 DIAGNOSIS — F331 Major depressive disorder, recurrent, moderate: Secondary | ICD-10-CM

## 2022-09-16 DIAGNOSIS — F5105 Insomnia due to other mental disorder: Secondary | ICD-10-CM

## 2022-09-16 DIAGNOSIS — F411 Generalized anxiety disorder: Secondary | ICD-10-CM

## 2022-09-16 DIAGNOSIS — F99 Mental disorder, not otherwise specified: Secondary | ICD-10-CM

## 2022-09-16 MED ORDER — TRAZODONE HCL 50 MG PO TABS: 50.0000 mg | ORAL_TABLET | Freq: Every day | ORAL | 1 refills | Status: DC

## 2022-09-16 MED ORDER — VORTIOXETINE HBR 20 MG PO TABS: 10.0000 mg | ORAL_TABLET | Freq: Every day | ORAL | 1 refills | Status: DC

## 2022-09-16 NOTE — Progress Notes (Signed)
[facility] MD/PA/NP Initial Note  09/16/2022 1:10 PM [name]  MRN:  [id number]  Chief Complaint:  Chief Complaint   Anxiety; Establish Care; Patient Education; Depression     HPI:  "[name]", 57 year old female presents to this office for initial visit and to establish care.  Collateral information should be considered reliable.  Says that she is here today because she is experiencing severe depression and anxiety that started to get worse last summer.  She is experiencing anhedonia, lack of motivation, feeling less social, and does not want to leave her home outside of work.  She says that she is not sure if part of the problem could be going through menopause.  Says that she has followed up with her OB/GYN.  Says that she is more concerned about " becoming more isolated, and a loaner".  She is currently working full-time for [employer]  She endorses racing thoughts, lack of concentration, and fatigue.  Her PHQ-9 was score of 22.  Her MDQ endorsed spending money irresponsibly.  Numerically rates depression at 6/10, and anxiety 7/10.  She is sleeping 6 to 7 hours per night but says that her sleep is broken.  She denies any history of mania, no psychosis, no auditory or visual hallucinations.  Denies SI and HI.  Patient states that she feels safe and verbally contracts for safety with this Probation officer.  Past psychiatric medication trials: Zoloft  Visit Diagnosis:    ICD-10-CM   1. Major depressive disorder, recurrent episode, moderate (HCC)  F33.1 vortioxetine HBr (TRINTELLIX) 20 MG TABS tablet    2. Generalized anxiety disorder  F41.1 vortioxetine HBr (TRINTELLIX) 20 MG TABS tablet    3. Insomnia due to other mental disorder  F51.05 traZODone (DESYREL) 50 MG tablet   F99       Past Psychiatric History: Anxiety, MDD  Past Medical History:  Past Medical History:  Diagnosis Date   Allergic rhinitis    Allergy    Anxiety    Depression    GERD (gastroesophageal reflux disease)     Hyperlipidemia    Hyperplastic colon polyp 10/2003   Hypertension    Menorrhagia    Obesity    PONV (postoperative nausea and vomiting)    RLS (restless legs syndrome)     Past Surgical History:  Procedure Laterality Date   COLONOSCOPY     CYSTOSCOPY N/A 10/12/2016   Procedure: CYSTOSCOPY;  Surgeon: Anastasio Auerbach, MD;  Location: [facility];  Service: Gynecology;  Laterality: N/A;   ENDOMETRIAL ABLATION  2007   Dr. Landry Mellow at [facility] WITH SALPINGECTOMY Bilateral 10/12/2016   Procedure: LAPAROSCOPIC ASSISTED VAGINAL HYSTERECTOMY WITH SALPINGECTOMY;  Surgeon: Anastasio Auerbach, MD;  Location: [facility];  Service: Gynecology;  Laterality: Bilateral;  request to follow first case at around 10:30am.  Requests 2 1/2 hours for the case.   SALIVARY GLAND SURGERY  12-11   LEFT SIDE NODULE REMOVED WAS BENIGN   TUBAL LIGATION      Family Psychiatric History: see chart  Family History:  Family History  Problem Relation Age of Onset   Cancer Mother        colon cancer at 33 yo   Colon cancer Mother    Drug abuse Father    Colon polyps Father    Breast cancer Maternal Aunt    Schizophrenia Paternal Aunt    Drug abuse Maternal Uncle    Heart attack Maternal Grandfather  Hyperlipidemia Other    Hypertension Other    Esophageal cancer Neg Hx    Rectal cancer Neg Hx    Stomach cancer Neg Hx     Social History:  Social History   Socioeconomic History   Marital status: Divorced    Spouse name: Not on file   Number of children: 2   Years of education: 14   Highest education level: Associate degree: academic program  Occupational History   Occupation: International aid/development worker: Psychologist, sport and [employer]    Employer: [employer]  Tobacco Use   Smoking status: Never   Smokeless tobacco: Never  Vaping Use   Vaping Use: Never used  Substance and Sexual Activity   Alcohol use: Not Currently    Comment: once every three  to 4 months.   Drug use: No   Sexual activity: Not Currently    Birth control/protection: Surgical, Condom    Comment: tubal ligation,INTERCOUSRE AGE 65, SEXUAL PARTNERS QUESTION DECLINED  Other Topics Concern   Not on file  Social History Narrative   Lives in [place], [place] alone. Likes to walk, womens empowerment group once per month.    Social Determinants of Health   Financial Resource Strain: Not on file  Food Insecurity: Not on file  Transportation Needs: Not on file  Physical Activity: Not on file  Stress: Not on file  Social Connections: Not on file    Allergies:  Allergies  Allergen Reactions   Elemental Sulfur Hives   Other     POLLEN AND DUST    Metabolic Disorder Labs: Lab Results  Component Value Date   HGBA1C 6.0 04/12/2022   No results found for: "PROLACTIN" Lab Results  Component Value Date   CHOL 213 (H) 04/12/2022   TRIG 102.0 04/12/2022   HDL 46.70 04/12/2022   CHOLHDL 5 04/12/2022   VLDL 20.4 04/12/2022   LDLCALC 145 (H) 04/12/2022   LDLCALC 88 08/27/2021   Lab Results  Component Value Date   TSH 0.94 08/27/2021   TSH 1.63 04/06/2021    Therapeutic Level Labs: No results found for: "LITHIUM" No results found for: "VALPROATE" No results found for: "CBMZ"  Current Medications: Current Outpatient Medications  Medication Sig Dispense Refill   amLODipine (NORVASC) 5 MG tablet Take 1 tablet (5 mg total) by mouth daily. 90 tablet 3   clonazePAM (KLONOPIN) 0.5 MG tablet Take 1 tablet by mouth twice daily as needed for anxiety 60 tablet 0   famotidine (PEPCID) 20 MG tablet Take 1 tablet (20 mg total) by mouth 2 (two) times daily. 180 tablet 3   fexofenadine (ALLEGRA) 180 MG tablet Take 1 tablet by mouth once daily 90 tablet 0   hydrochlorothiazide (HYDRODIURIL) 25 MG tablet Take 1 tablet (25 mg total) by mouth daily. 90 tablet 3   linaclotide (LINZESS) 145 MCG CAPS capsule Take 1 capsule by mouth once daily 90 capsule 2   metoprolol succinate  (TOPROL-XL) 50 MG 24 hr tablet Take 1 tablet (50 mg total) by mouth daily. Take with or immediately following a meal. 30 tablet 0   olmesartan (BENICAR) 40 MG tablet Take 1 tablet (40 mg total) by mouth daily. 30 tablet 0   pantoprazole (PROTONIX) 40 MG tablet Take 1 tablet (40 mg total) by mouth 2 (two) times daily. 180 tablet 3   potassium chloride (KLOR-CON) 10 MEQ tablet Take 2 tablets (20 mEq total) by mouth daily. 360 tablet 3   rosuvastatin (CRESTOR) 10 MG tablet  Take 1 tablet (10 mg total) by mouth daily. 90 tablet 0   traZODone (DESYREL) 50 MG tablet Take 1 tablet (50 mg total) by mouth at bedtime. 30 tablet 1   vortioxetine HBr (TRINTELLIX) 20 MG TABS tablet Take 0.5 tablets (10 mg total) by mouth daily. 30 tablet 1   fluconazole (DIFLUCAN) 200 MG tablet Take 1 tablet (200 mg total) by mouth every 3 (three) days as needed. 2 tablet 0   Fluocinolone Acetonide Body 0.01 % OIL Apply 1 application topically 2 (two) times daily as needed (flaking of scalp).   11   No current facility-administered medications for this visit.    Medication Side Effects: none  Orders placed this visit:  No orders of the defined types were placed in this encounter.   Psychiatric Specialty Exam:  Review of Systems  Respiratory:  Positive for shortness of breath.     Blood pressure (!) 179/110, pulse 82, height '5\' 1"'$  (1.549 m), weight 189 lb (85.7 kg), last menstrual period 10/01/2016.Body mass index is 35.71 kg/m.  General Appearance: Casual, Neat, and Well Groomed  Eye Contact:  Good  Speech:  Clear and Coherent  Volume:  Normal  Mood:  Anxious and Depressed  Affect:  Depressed, Flat, and Anxious  Thought Process:  Coherent  Orientation:  Full (Time, Place, and Person)  Thought Content: Logical   Suicidal Thoughts:  No  Homicidal Thoughts:  No  Memory:  WNL  Judgement:  Good  Insight:  Good  Psychomotor Activity:  Normal  Concentration:  Concentration: Good  Recall:  Good  Fund of  Knowledge: Good  Language: Good  Assets:  Desire for Improvement  ADL's:  Intact  Cognition: WNL  Prognosis:  Good   Screenings:  GAD-7    Flowsheet Row Office Visit from 05/27/2017 in [facility]  Total GAD-7 Score 11      PHQ2-9    [facility] Office Visit from 09/16/2022 in [facility] Visit from 04/12/2022 in [facility] at [facility] Visit from 04/10/2021 in [facility] at [facility] Visit from 12/07/2019 in [facility] at [facility] Visit from 12/21/2018 in [facility] at [facility] Total Score 6 1 0 1 0  PHQ-9 Total Score 22 4 -- 0 --       Receiving Psychotherapy: No   Treatment Plan/Recommendations:   Greater than 50% of  60 min face to face time with patient was spent on counseling and coordination of care. We discussed her history of anxiety and depression stemming back the last 2 years.  We discussed her previous plan of care and reviewed medications.  Patient has only attempted trial of 1 other psychiatric medication being Zoloft several years ago.  We discussed possible medication options along with possible side effect profiles.  She was adamant about not taking any medication that would cause her to gain weight.  Said that she had researched Trintellix and would specifically like to try this medication if being placed on an antidepressant. We agreed to: Will start Trintellix 10 mg daily after breakfast To start trazodone 50 mg at bedtime daily Educated on good sleep hygiene  Will report worsening symptoms promptly To follow-up in 4 weeks to reassess Provided emergency contact information Provided 2 weeks samples of Trintellix 10 mg plus discount drug card Reviewed PDMP       Elwanda [place], NP

## 2022-09-23 ENCOUNTER — Other Ambulatory Visit: Payer: Self-pay | Admitting: Internal Medicine

## 2022-09-23 DIAGNOSIS — Z1231 Encounter for screening mammogram for malignant neoplasm of breast: Secondary | ICD-10-CM

## 2022-10-11 ENCOUNTER — Ambulatory Visit: Payer: BC Managed Care – PPO | Admitting: Internal Medicine

## 2022-10-11 ENCOUNTER — Ambulatory Visit (INDEPENDENT_AMBULATORY_CARE_PROVIDER_SITE_OTHER): Payer: BC Managed Care – PPO

## 2022-10-11 VITALS — BP 172/80 | HR 60 | Temp 98.0°F | Ht 61.0 in | Wt 186.1 lb

## 2022-10-11 DIAGNOSIS — F411 Generalized anxiety disorder: Secondary | ICD-10-CM

## 2022-10-11 DIAGNOSIS — R109 Unspecified abdominal pain: Secondary | ICD-10-CM

## 2022-10-11 DIAGNOSIS — R350 Frequency of micturition: Secondary | ICD-10-CM | POA: Diagnosis not present

## 2022-10-11 DIAGNOSIS — I10 Essential (primary) hypertension: Secondary | ICD-10-CM

## 2022-10-11 LAB — URINALYSIS, ROUTINE W REFLEX MICROSCOPIC
Bilirubin Urine: NEGATIVE
Hgb urine dipstick: NEGATIVE
Ketones, ur: NEGATIVE
Leukocytes,Ua: NEGATIVE
Nitrite: NEGATIVE
RBC / HPF: NONE SEEN (ref 0–?)
Specific Gravity, Urine: 1.025 (ref 1.000–1.030)
Total Protein, Urine: NEGATIVE
Urine Glucose: NEGATIVE
Urobilinogen, UA: 0.2 (ref 0.0–1.0)
pH: 6 (ref 5.0–8.0)

## 2022-10-11 NOTE — Progress Notes (Unsigned)
Patient ID: Jayleah Lovaglio, female   DOB: Dec 30, 1965, 57 y.o.   MRN: [id number]        Chief Complaint: follow up right flank pain, htn, anxiety depression, urinary frequency       HPI:  Romi Fullenwider is a 57 y.o. female here with c/o 3 days onset urinary frequency and right flank pain mild sharp, somewhat worse to twist at the waist or bend or deep breaths, Denies urinary symptoms such as dysuria, urgency, hematuria or n/v, fever, chills.  BP has been stable controlled at home, Denies worsening depressive symptoms, suicidal ideation, or panic; has ongoing anxiety, and at least mild depressive symptoms persistent, and has appt at [facility] tomorrow in followup.  Pt denies chest pain, increased sob or doe, wheezing, orthopnea, PND, increased LE swelling, palpitations, dizziness or syncope.   Pt denies polydipsia, polyuria, or new focal neuro s/s.    Pt denies recent wt loss, night sweats, loss of appetite, or other constitutional symptoms         Wt Readings from Last 3 Encounters:  10/11/22 186 lb 2 oz (84.4 kg)  04/12/22 185 lb (83.9 kg)  08/27/21 182 lb (82.6 kg)   BP Readings from Last 3 Encounters:  10/11/22 (!) 172/80  04/12/22 (!) 182/108  08/27/21 (!) 142/80         Past Medical History:  Diagnosis Date   Allergic rhinitis    Allergy    Anxiety    Depression    GERD (gastroesophageal reflux disease)    Hyperlipidemia    Hyperplastic colon polyp 10/2003   Hypertension    Menorrhagia    Obesity    PONV (postoperative nausea and vomiting)    RLS (restless legs syndrome)    Past Surgical History:  Procedure Laterality Date   COLONOSCOPY     CYSTOSCOPY N/A 10/12/2016   Procedure: CYSTOSCOPY;  Surgeon: Anastasio Auerbach, MD;  Location: [facility];  Service: Gynecology;  Laterality: N/A;   ENDOMETRIAL ABLATION  2007   Dr. Landry Mellow at [place] [place] WITH SALPINGECTOMY Bilateral 10/12/2016   Procedure: LAPAROSCOPIC  ASSISTED VAGINAL HYSTERECTOMY WITH SALPINGECTOMY;  Surgeon: Anastasio Auerbach, MD;  Location: [facility];  Service: Gynecology;  Laterality: Bilateral;  request to follow first case at around 10:30am.  Requests 2 1/2 hours for the case.   SALIVARY GLAND SURGERY  12-11   LEFT SIDE NODULE REMOVED WAS BENIGN   TUBAL LIGATION      reports that she has never smoked. She has never used smokeless tobacco. She reports that she does not currently use alcohol. She reports that she does not use drugs. family history includes Breast cancer in her maternal aunt; Cancer in her mother; Colon cancer in her mother; Colon polyps in her father; Drug abuse in her father and maternal uncle; Heart attack in her maternal grandfather; Hyperlipidemia in an other family member; Hypertension in an other family member; Schizophrenia in her paternal aunt. Allergies  Allergen Reactions   Elemental Sulfur Hives   Other     POLLEN AND DUST   Sulfur Other (See Comments)   Current Outpatient Medications on File Prior to Visit  Medication Sig Dispense Refill   amLODipine (NORVASC) 5 MG tablet Take 1 tablet (5 mg total) by mouth daily. 90 tablet 3   clonazePAM (KLONOPIN) 0.5 MG tablet Take 1 tablet by mouth twice daily as needed for anxiety 60 tablet 0   famotidine (  PEPCID) 20 MG tablet Take 1 tablet (20 mg total) by mouth 2 (two) times daily. 180 tablet 3   fexofenadine (ALLEGRA) 180 MG tablet Take 1 tablet by mouth once daily 90 tablet 0   Fluocinolone Acetonide Body 0.01 % OIL Apply 1 application topically 2 (two) times daily as needed (flaking of scalp).   11   hydrochlorothiazide (HYDRODIURIL) 25 MG tablet Take 1 tablet (25 mg total) by mouth daily. 90 tablet 3   linaclotide (LINZESS) 145 MCG CAPS capsule Take 1 capsule by mouth once daily 90 capsule 2   metoprolol succinate (TOPROL-XL) 50 MG 24 hr tablet Take 1 tablet (50 mg total) by mouth daily. Take with or immediately following a meal. 30 tablet 0   olmesartan  (BENICAR) 40 MG tablet Take 1 tablet (40 mg total) by mouth daily. 30 tablet 0   pantoprazole (PROTONIX) 40 MG tablet Take 1 tablet (40 mg total) by mouth 2 (two) times daily. 180 tablet 3   potassium chloride (KLOR-CON) 10 MEQ tablet Take 2 tablets (20 mEq total) by mouth daily. 360 tablet 3   rosuvastatin (CRESTOR) 10 MG tablet Take 1 tablet (10 mg total) by mouth daily. 90 tablet 0   traZODone (DESYREL) 50 MG tablet Take 1 tablet (50 mg total) by mouth at bedtime. 30 tablet 1   vortioxetine HBr (TRINTELLIX) 20 MG TABS tablet Take 0.5 tablets (10 mg total) by mouth daily. 30 tablet 1   [DISCONTINUED] fluticasone (FLONASE) 50 MCG/ACT nasal spray 2 sprays by Nasal route daily. 16 g 2   No current facility-administered medications on file prior to visit.        ROS:  All others reviewed and negative.  Objective        PE:  BP (!) 172/80   Pulse 60   Temp 98 F (36.7 C) (Temporal)   Ht 5\' 1"  (1.549 m)   Wt 186 lb 2 oz (84.4 kg)   LMP 10/01/2016   SpO2 93%   BMI 35.17 kg/m                 Constitutional: Pt appears in NAD, non toxic               HENT: Head: NCAT.                Right Ear: External ear normal.                 Left Ear: External ear normal.                Eyes: . Pupils are equal, round, and reactive to light. Conjunctivae and EOM are normal               Nose: without d/c or deformity               Neck: Neck supple. Gross normal ROM               Cardiovascular: Normal rate and regular rhythm.                 Pulmonary/Chest: Effort normal and breath sounds without rales or wheezing.                Abd:  Soft, NT, ND, + BS, no organomegaly; has mild right flank tender               Neurological: Pt is alert. At baseline orientation, motor grossly intact  Skin: Skin is warm. No rashes, no other new lesions, LE edema - none               Psychiatric: Pt behavior is normal without agitation , depressed affect  Micro: none  Cardiac tracings I have  personally interpreted today:  none  Pertinent Radiological findings (summarize): none   Lab Results  Component Value Date   WBC 7.2 08/27/2021   HGB 12.9 08/27/2021   HCT 39.0 08/27/2021   PLT 188.0 08/27/2021   GLUCOSE 98 04/12/2022   CHOL 213 (H) 04/12/2022   TRIG 102.0 04/12/2022   HDL 46.70 04/12/2022   LDLDIRECT 156.0 04/17/2013   LDLCALC 145 (H) 04/12/2022   ALT 7 04/12/2022   AST 14 04/12/2022   NA 142 04/12/2022   K 5.0 04/12/2022   CL 102 04/12/2022   CREATININE 0.76 04/12/2022   BUN 10 04/12/2022   CO2 32 04/12/2022   TSH 0.94 08/27/2021   HGBA1C 6.0 04/12/2022   MICROALBUR 1.6 08/27/2021   Assessment/Plan:  Recie Carnell is a 57 y.o. Black or African American [2] female with  has a past medical history of Allergic rhinitis, Allergy, Anxiety, Depression, GERD (gastroesophageal reflux disease), Hyperlipidemia, Hyperplastic colon polyp (10/2003), Hypertension, Menorrhagia, Obesity, PONV (postoperative nausea and vomiting), and RLS (restless legs syndrome).  Urinary frequency Can't r/o uti - for ua and culture, with tx pending results  Right flank pain ? Msk or GU vs other - also for cxr r/o pulmonary disorder  Primary hypertension BP Readings from Last 3 Encounters:  10/11/22 (!) 172/80  04/12/22 (!) 182/108  08/27/21 (!) 142/80       Unocntrolled. pt adamant states controlled at home,  to f/u any worsening symptoms or concerns  pt to continue medical treatment norvasc 5 mg qd, hct 25 mg qd, toprol xl 50 mg qd, benicar 40 mg qd   Anxiety state Pt for f/u with psychiatry as planned tomorrow  Followup: Return in about 3 months (around 01/11/2023).  [name], MD 10/13/2022 12:58 PM [facility]

## 2022-10-11 NOTE — Patient Instructions (Addendum)
Please continue all other medications as before, and refills have been done if requested.  Please have the pharmacy call with any other refills you may need.  Please keep your appointments with your specialists as you may have planned  Please go to the XRAY Department in the first floor for the x-ray testing  Please go to the LAB at the blood drawing area for the tests to be done - just the urine testing today  Please make an Appointment to return in 3 months, or sooner if needed

## 2022-10-12 ENCOUNTER — Encounter: Payer: Self-pay | Admitting: Behavioral Health

## 2022-10-12 ENCOUNTER — Ambulatory Visit (INDEPENDENT_AMBULATORY_CARE_PROVIDER_SITE_OTHER): Payer: BC Managed Care – PPO | Admitting: Behavioral Health

## 2022-10-12 DIAGNOSIS — F411 Generalized anxiety disorder: Secondary | ICD-10-CM | POA: Diagnosis not present

## 2022-10-12 DIAGNOSIS — F331 Major depressive disorder, recurrent, moderate: Secondary | ICD-10-CM

## 2022-10-12 LAB — URINE CULTURE: Result:: NO GROWTH

## 2022-10-12 MED ORDER — VILAZODONE HCL 20 MG PO TABS: 20.0000 mg | ORAL_TABLET | Freq: Every day | ORAL | 1 refills | Status: DC

## 2022-10-12 MED ORDER — HYDROXYZINE HCL 25 MG PO TABS: 25.0000 mg | ORAL_TABLET | Freq: Three times a day (TID) | ORAL | 0 refills | Status: DC | PRN

## 2022-10-12 NOTE — Progress Notes (Signed)
[facility] Check  Patient ID: [name],  MRN: [id number]  PCP: Biagio Borg, MD  Date of Evaluation: 10/12/2022 Time spent:30 minutes  Chief Complaint:  Chief Complaint   Anxiety; Depression; Follow-up; Medication Refill; Patient Education     HISTORY/CURRENT STATUS: HPI  "[name]", 57 year old female presents to this office for follow up and medication management. Collateral information should be considered reliable.  Says that Trintellix is making her tremble and shake to an intolerable level. She does feel like her depression improved but she would like to switch medications at this time.  Numerically rates depression at 5/10, and anxiety 4/10.  She is sleeping 6 to 7 hours per night but says that her sleep is broken.  She denies any history of mania, no psychosis, no auditory or visual hallucinations.  Denies SI and HI.  Patient states that she feels safe and verbally contracts for safety with this Probation officer.   Past psychiatric medication trials: Zoloft   Individual Medical History/ Review of Systems: Changes? :No   Allergies: Elemental sulfur, Other, and Sulfur  Current Medications:  Current Outpatient Medications:    hydrOXYzine (ATARAX) 25 MG tablet, Take 1 tablet (25 mg total) by mouth 3 (three) times daily as needed., Disp: 30 tablet, Rfl: 0   Vilazodone HCl 20 MG TABS, Take 1 tablet (20 mg total) by mouth daily after breakfast., Disp: 30 tablet, Rfl: 1   amLODipine (NORVASC) 5 MG tablet, Take 1 tablet (5 mg total) by mouth daily., Disp: 90 tablet, Rfl: 3   clonazePAM (KLONOPIN) 0.5 MG tablet, Take 1 tablet by mouth twice daily as needed for anxiety, Disp: 60 tablet, Rfl: 0   famotidine (PEPCID) 20 MG tablet, Take 1 tablet (20 mg total) by mouth 2 (two) times daily., Disp: 180 tablet, Rfl: 3   fexofenadine (ALLEGRA) 180 MG tablet, Take 1 tablet by mouth once daily, Disp: 90 tablet, Rfl: 0   Fluocinolone Acetonide Body 0.01 % OIL, Apply 1 application topically  2 (two) times daily as needed (flaking of scalp). , Disp: , Rfl: 11   hydrochlorothiazide (HYDRODIURIL) 25 MG tablet, Take 1 tablet (25 mg total) by mouth daily., Disp: 90 tablet, Rfl: 3   linaclotide (LINZESS) 145 MCG CAPS capsule, Take 1 capsule by mouth once daily, Disp: 90 capsule, Rfl: 2   metoprolol succinate (TOPROL-XL) 50 MG 24 hr tablet, Take 1 tablet (50 mg total) by mouth daily. Take with or immediately following a meal., Disp: 30 tablet, Rfl: 0   olmesartan (BENICAR) 40 MG tablet, Take 1 tablet (40 mg total) by mouth daily., Disp: 30 tablet, Rfl: 0   pantoprazole (PROTONIX) 40 MG tablet, Take 1 tablet (40 mg total) by mouth 2 (two) times daily., Disp: 180 tablet, Rfl: 3   potassium chloride (KLOR-CON) 10 MEQ tablet, Take 2 tablets (20 mEq total) by mouth daily., Disp: 360 tablet, Rfl: 3   rosuvastatin (CRESTOR) 10 MG tablet, Take 1 tablet (10 mg total) by mouth daily., Disp: 90 tablet, Rfl: 0   traZODone (DESYREL) 50 MG tablet, Take 1 tablet (50 mg total) by mouth at bedtime., Disp: 30 tablet, Rfl: 1   vortioxetine HBr (TRINTELLIX) 20 MG TABS tablet, Take 0.5 tablets (10 mg total) by mouth daily., Disp: 30 tablet, Rfl: 1 Medication Side Effects: none  Family Medical/ Social History: Changes? No  MENTAL HEALTH EXAM:  Last menstrual period 10/01/2016.There is no height or weight on file to calculate BMI.  General Appearance: Casual, Neat, and Well Groomed  Eye  Contact:  Good  Speech:  Clear and Coherent  Volume:  Normal  Mood:  NA  Affect:  Appropriate  Thought Process:  Coherent  Orientation:  Full (Time, Place, and Person)  Thought Content: Logical   Suicidal Thoughts:  No  Homicidal Thoughts:  No  Memory:  WNL  Judgement:  Good  Insight:  Good  Psychomotor Activity:  Normal  Concentration:  Concentration: Good  Recall:  Good  Fund of Knowledge: Good  Language: Good  Assets:  Desire for Improvement  ADL's:  Intact  Cognition: WNL  Prognosis:  Good    DIAGNOSES:     ICD-10-CM   1. Generalized anxiety disorder  F41.1 Vilazodone HCl 20 MG TABS    hydrOXYzine (ATARAX) 25 MG tablet    2. Major depressive disorder, recurrent episode, moderate (HCC)  F33.1 Vilazodone HCl 20 MG TABS    hydrOXYzine (ATARAX) 25 MG tablet      Receiving Psychotherapy: No    RECOMMENDATIONS:  Greater than 50% of  30 min face to face time with patient was spent on counseling and coordination of care. Discussed significant improvement with anxiety and depression but she cannot get passed the tremors. She would like to consider changing medications.  We agreed to: To reduce Trintellix to 5 mg daily for one week and then stop To start Viibryd 10 mg for 7 days, then 20 mg daily. Continue trazodone 50 mg at bedtime daily Educated on good sleep hygiene  Will report worsening symptoms promptly To follow-up in 4 weeks to reassess Provided emergency contact information  Reviewed [place], NP

## 2022-10-13 ENCOUNTER — Encounter: Payer: Self-pay | Admitting: Internal Medicine

## 2022-10-13 DIAGNOSIS — R350 Frequency of micturition: Secondary | ICD-10-CM | POA: Insufficient documentation

## 2022-10-13 DIAGNOSIS — R109 Unspecified abdominal pain: Secondary | ICD-10-CM | POA: Insufficient documentation

## 2022-10-13 DIAGNOSIS — R030 Elevated blood-pressure reading, without diagnosis of hypertension: Secondary | ICD-10-CM | POA: Insufficient documentation

## 2022-10-13 NOTE — Assessment & Plan Note (Addendum)
BP Readings from Last 3 Encounters:  10/11/22 (!) 172/80  04/12/22 (!) 182/108  08/27/21 (!) 142/80       Unocntrolled. pt adamant states controlled at home,  to f/u any worsening symptoms or concerns  pt to continue medical treatment norvasc 5 mg qd, hct 25 mg qd, toprol xl 50 mg qd, benicar 40 mg qd

## 2022-10-13 NOTE — Assessment & Plan Note (Signed)
Pt for f/u with psychiatry as planned tomorrow

## 2022-10-13 NOTE — Assessment & Plan Note (Signed)
?   Msk or GU vs other - also for cxr r/o pulmonary disorder

## 2022-10-13 NOTE — Assessment & Plan Note (Signed)
Can't r/o uti - for ua and culture, with tx pending results

## 2022-10-13 NOTE — Assessment & Plan Note (Deleted)
Mild, pt states controlled at home,  to f/u any worsening symptoms or concerns

## 2022-10-14 ENCOUNTER — Ambulatory Visit: Payer: BC Managed Care – PPO | Admitting: Behavioral Health

## 2022-11-09 ENCOUNTER — Other Ambulatory Visit: Payer: Self-pay | Admitting: Internal Medicine

## 2022-11-09 ENCOUNTER — Encounter: Payer: Self-pay | Admitting: Behavioral Health

## 2022-11-09 ENCOUNTER — Ambulatory Visit (INDEPENDENT_AMBULATORY_CARE_PROVIDER_SITE_OTHER): Payer: BC Managed Care – PPO | Admitting: Behavioral Health

## 2022-11-09 DIAGNOSIS — F331 Major depressive disorder, recurrent, moderate: Secondary | ICD-10-CM | POA: Diagnosis not present

## 2022-11-09 DIAGNOSIS — F411 Generalized anxiety disorder: Secondary | ICD-10-CM

## 2022-11-09 MED ORDER — CLONAZEPAM 0.5 MG PO TABS: ORAL_TABLET | ORAL | 0 refills | Status: DC

## 2022-11-09 MED ORDER — VILAZODONE HCL 20 MG PO TABS: 20.0000 mg | ORAL_TABLET | Freq: Every day | ORAL | 3 refills | Status: DC

## 2022-11-09 MED ORDER — HYDROXYZINE HCL 25 MG PO TABS
25.0000 mg | ORAL_TABLET | Freq: Three times a day (TID) | ORAL | 1 refills | Status: DC | PRN
Start: 2022-11-09 — End: 2023-01-09

## 2022-11-09 NOTE — Progress Notes (Signed)
[facility]  Patient ID: [name],  MRN: [id number]  PCP: Corwin Levins, MD  Date of Evaluation: 11/09/2022 Time spent:20 minutes  Chief Complaint:  Chief Complaint   Anxiety; Depression; Follow-up; Patient Education; Medication Refill     HISTORY/CURRENT STATUS: HPI "[name]", 57 year old female presents to this office for follow up and medication management. Collateral information should be considered reliable.  She is liking the Viibryd and feels like she is improving with anxiety and depression.  She also is utilizing hydroxyzine at bedtime. Numerically rates depression at 3/10, and anxiety 3/10.  She is sleeping 6 to 7 hours per night but says that her sleep is broken.  She denies any history of mania, no psychosis, no auditory or visual hallucinations.  Denies SI and HI.  Patient states that she feels safe and verbally contracts for safety with this Clinical research associate.   Past psychiatric medication trials: Zoloft Individual Medical History/ Review of Systems: Changes? :No   Allergies: Elemental sulfur, Other, and Sulfur  Current Medications:  Current Outpatient Medications:    amLODipine (NORVASC) 5 MG tablet, Take 1 tablet (5 mg total) by mouth daily., Disp: 90 tablet, Rfl: 3   clonazePAM (KLONOPIN) 0.5 MG tablet, Take one tablet by mouth daily as needed for severe anxiety, Disp: 30 tablet, Rfl: 0   famotidine (PEPCID) 20 MG tablet, Take 1 tablet (20 mg total) by mouth 2 (two) times daily., Disp: 180 tablet, Rfl: 3   fexofenadine (ALLEGRA) 180 MG tablet, Take 1 tablet by mouth once daily, Disp: 90 tablet, Rfl: 0   Fluocinolone Acetonide Body 0.01 % OIL, Apply 1 application topically 2 (two) times daily as needed (flaking of scalp). , Disp: , Rfl: 11   hydrochlorothiazide (HYDRODIURIL) 25 MG tablet, Take 1 tablet (25 mg total) by mouth daily., Disp: 90 tablet, Rfl: 3   hydrOXYzine (ATARAX) 25 MG tablet, Take 1 tablet (25 mg total) by mouth 3 (three) times daily as  needed., Disp: 90 tablet, Rfl: 1   linaclotide (LINZESS) 145 MCG CAPS capsule, Take 1 capsule by mouth once daily, Disp: 90 capsule, Rfl: 2   metoprolol succinate (TOPROL-XL) 50 MG 24 hr tablet, Take 1 tablet (50 mg total) by mouth daily. Take with or immediately following a meal., Disp: 30 tablet, Rfl: 0   olmesartan (BENICAR) 40 MG tablet, Take 1 tablet (40 mg total) by mouth daily., Disp: 30 tablet, Rfl: 0   pantoprazole (PROTONIX) 40 MG tablet, Take 1 tablet (40 mg total) by mouth 2 (two) times daily., Disp: 180 tablet, Rfl: 3   potassium chloride (KLOR-CON) 10 MEQ tablet, Take 2 tablets (20 mEq total) by mouth daily., Disp: 360 tablet, Rfl: 3   rosuvastatin (CRESTOR) 10 MG tablet, Take 1 tablet (10 mg total) by mouth daily., Disp: 90 tablet, Rfl: 0   traZODone (DESYREL) 50 MG tablet, Take 1 tablet (50 mg total) by mouth at bedtime., Disp: 30 tablet, Rfl: 1   Vilazodone HCl 20 MG TABS, Take 1 tablet (20 mg total) by mouth daily after breakfast., Disp: 30 tablet, Rfl: 3   vortioxetine HBr (TRINTELLIX) 20 MG TABS tablet, Take 0.5 tablets (10 mg total) by mouth daily., Disp: 30 tablet, Rfl: 1 Medication Side Effects: none  Family Medical/ Social History: Changes? No  MENTAL HEALTH EXAM:  Last menstrual period 10/01/2016.There is no height or weight on file to calculate BMI.  General Appearance: Casual, Neat, and Well Groomed  Eye Contact:  Good  Speech:  Clear and Coherent  Volume:  Normal  Mood:  Anxious, Depressed, and Dysphoric  Affect:  Depressed and Anxious  Thought Process:  Coherent  Orientation:  Full (Time, Place, and Person)  Thought Content: Logical   Suicidal Thoughts:  No  Homicidal Thoughts:  No  Memory:  WNL  Judgement:  Good  Insight:  Good  Psychomotor Activity:  Normal  Concentration:  Concentration: Good  Recall:  Good  Fund of Knowledge: Good  Language: Good  Assets:  Desire for Improvement  ADL's:  Intact  Cognition: WNL  Prognosis:  Good    DIAGNOSES:     ICD-10-CM   1. Generalized anxiety disorder  F41.1 clonazePAM (KLONOPIN) 0.5 MG tablet    hydrOXYzine (ATARAX) 25 MG tablet    Vilazodone HCl 20 MG TABS    2. Major depressive disorder, recurrent episode, moderate  F33.1 hydrOXYzine (ATARAX) 25 MG tablet    Vilazodone HCl 20 MG TABS      Receiving Psychotherapy: No    RECOMMENDATIONS:   Greater than 50% of  30 min face to face time with patient was spent on counseling and coordination of care. Discussed significant improvement with anxiety and depression. So far she is happy with Viibryd and says that she has improved about 60 %. She is requesting no medication changes this visit.   We agreed to:  To continue Viibryd 20 mg daily, must take with food.  Continue hydroxyzine 25 mg three times daily as needed. Continue Klonopin 0.5 mg daily only for severe anxiety Continue trazodone 50 mg at bedtime daily Educated on good sleep hygiene  Will report worsening symptoms promptly To follow-up in 4 weeks to reassess Provided emergency contact information   Reviewed PDMP    Joan Flores, NP

## 2022-11-11 ENCOUNTER — Ambulatory Visit
Admission: RE | Admit: 2022-11-11 | Discharge: 2022-11-11 | Disposition: A | Discharge status: Home / Self Care | Payer: BC Managed Care – PPO | Source: Ambulatory Visit | Attending: Internal Medicine | Admitting: Internal Medicine

## 2022-11-11 DIAGNOSIS — Z1231 Encounter for screening mammogram for malignant neoplasm of breast: Secondary | ICD-10-CM

## 2022-11-12 ENCOUNTER — Other Ambulatory Visit: Payer: Self-pay | Admitting: Internal Medicine

## 2022-11-14 ENCOUNTER — Emergency Department (HOSPITAL_COMMUNITY)
Admission: EM | Admit: 2022-11-14 | Discharge: 2022-11-14 | Disposition: A | Discharge status: Home / Self Care | Payer: BC Managed Care – PPO | Attending: Emergency Medicine | Admitting: Emergency Medicine

## 2022-11-14 ENCOUNTER — Emergency Department (HOSPITAL_COMMUNITY): Payer: BC Managed Care – PPO

## 2022-11-14 ENCOUNTER — Encounter (HOSPITAL_COMMUNITY): Payer: Self-pay

## 2022-11-14 DIAGNOSIS — I10 Essential (primary) hypertension: Secondary | ICD-10-CM | POA: Diagnosis present

## 2022-11-14 DIAGNOSIS — R519 Headache, unspecified: Secondary | ICD-10-CM | POA: Diagnosis not present

## 2022-11-14 DIAGNOSIS — Z79899 Other long term (current) drug therapy: Secondary | ICD-10-CM | POA: Diagnosis not present

## 2022-11-14 LAB — BASIC METABOLIC PANEL
Anion gap: 11 (ref 5–15)
BUN: 12 mg/dL (ref 6–20)
CO2: 29 mmol/L (ref 22–32)
Calcium: 9.4 mg/dL (ref 8.9–10.3)
Chloride: 100 mmol/L (ref 98–111)
Creatinine, Ser: 1 mg/dL (ref 0.44–1.00)
GFR, Estimated: >60 mL/min (ref >60)
Glucose, Bld: 97 mg/dL (ref 70–99)
Potassium: 3.3 mmol/L — ABNORMAL LOW (ref 3.5–5.1)
Sodium: 140 mmol/L (ref 135–145)

## 2022-11-14 LAB — CBC
HCT: 39.6 % (ref 36.0–46.0)
Hemoglobin: 12.9 g/dL (ref 12.0–15.0)
MCH: 30.6 pg (ref 26.0–34.0)
MCHC: 32.6 g/dL (ref 30.0–36.0)
MCV: 93.8 fL (ref 80.0–100.0)
Platelets: 195 10*3/uL (ref 150–400)
RBC: 4.22 MIL/uL (ref 3.87–5.11)
RDW: 14.5 % (ref 11.5–15.5)
WBC: 8 10*3/uL (ref 4.0–10.5)
nRBC: 0 % (ref 0.0–0.2)

## 2022-11-14 LAB — TROPONIN I (HIGH SENSITIVITY): Troponin I (High Sensitivity): 6 ng/L (ref <18)

## 2022-11-14 MED ORDER — CLONIDINE HCL 0.1 MG PO TABS
0.3000 mg | ORAL_TABLET | Freq: Once | ORAL | Status: AC
  Administered 2022-11-14: 0.3 mg via ORAL
  Filled 2022-11-14: qty 3

## 2022-11-14 MED ORDER — CLONIDINE HCL 0.2 MG PO TABS: 0.2000 mg | ORAL_TABLET | Freq: Three times a day (TID) | ORAL | 0 refills | Status: DC | PRN

## 2022-11-14 NOTE — ED Provider Notes (Signed)
[place] EMERGENCY DEPARTMENT AT [facility] Provider Note   CSN: [id number] Arrival date & time: 11/14/22  1540     History  Chief Complaint  Patient presents with   Hypertension    [name] is a 57 y.o. female presenting from home with complaint of high blood pressure.  The patient reports that she has had a frontal headache yesterday, which she felt was a sinus infection, and the headache went away completely this morning.  She woke up and felt fine and went discharge.  They noted that her blood pressure was high at church, and advised that she contact her doctor's office, who then advised to come into the hospital.  The patient reports that her blood pressure typically "runs high" around 170 mmhg systolic.  She takes HCTZ and lisinopril and metoprolol and has not missed any medications.  She says she is currently asymptomatic, and denies to me that she is having any chest discomfort.  She says she has had some "funny feelings" in her chest over the past several days.  Per my review of care everywhere records, the patient had a cardiac catheterization in November 2023 at [facility], with report noting normal coronary arteries.  HPI     Home Medications Prior to Admission medications   Medication Sig Start Date End Date Taking? Authorizing Provider  cloNIDine (CATAPRES) 0.2 MG tablet Take 1 tablet (0.2 mg total) by mouth 3 (three) times daily as needed for up to 30 doses. 11/14/22  Yes Terald Sleeper, MD  amLODipine (NORVASC) 5 MG tablet Take 1 tablet (5 mg total) by mouth daily. 07/05/22   Corwin Levins, MD  clonazePAM [facility]) 0.5 MG tablet Take one tablet by mouth daily as needed for severe anxiety 11/09/22   Joan Flores, NP  famotidine (PEPCID) 20 MG tablet Take 1 tablet (20 mg total) by mouth 2 (two) times daily. 07/07/20   Corwin Levins, MD  fexofenadine [facility]) 180 MG tablet Take 1 tablet by mouth once daily 12/31/20   Corwin Levins, MD   Fluocinolone Acetonide Body 0.01 % OIL Apply 1 application topically 2 (two) times daily as needed (flaking of scalp).  02/04/18   [provider]  hydrochlorothiazide (HYDRODIURIL) 25 MG tablet Take 1 tablet (25 mg total) by mouth daily. 04/12/22   Corwin Levins, MD  hydrOXYzine (ATARAX) 25 MG tablet Take 1 tablet (25 mg total) by mouth 3 (three) times daily as needed. 11/09/22   Joan Flores, NP  linaclotide [facility]) 145 MCG CAPS capsule Take 1 capsule by mouth once daily 12/02/21   Corwin Levins, MD  metoprolol succinate (TOPROL-XL) 50 MG 24 hr tablet Take 1 tablet (50 mg total) by mouth daily. Take with or immediately following a meal. 08/16/22   Corwin Levins, MD  olmesartan (BENICAR) 40 MG tablet Take 1 tablet (40 mg total) by mouth daily. 08/16/22   Corwin Levins, MD  pantoprazole (PROTONIX) 40 MG tablet Take 1 tablet (40 mg total) by mouth 2 (two) times daily. 04/10/21   Corwin Levins, MD  potassium chloride (KLOR-CON) 10 MEQ tablet Take 2 tablets (20 mEq total) by mouth daily. 06/09/22   Corwin Levins, MD  rosuvastatin (CRESTOR) 10 MG tablet Take 1 tablet (10 mg total) by mouth daily. 08/16/22   Corwin Levins, MD  traZODone (DESYREL) 50 MG tablet Take 1 tablet (50 mg total) by mouth at bedtime. 09/16/22   Joan Flores,  NP  Vilazodone HCl 20 MG TABS Take 1 tablet (20 mg total) by mouth daily after breakfast. 11/09/22   Joan Flores, NP  vortioxetine HBr (TRINTELLIX) 20 MG TABS tablet Take 0.5 tablets (10 mg total) by mouth daily. 09/16/22   Joan Flores, NP  fluticasone (FLONASE) 50 MCG/ACT nasal spray 2 sprays by Nasal route daily. 10/12/10 09/27/11  Corwin Levins, MD      Allergies    Elemental sulfur, Other, and Sulfur    Review of Systems   Review of Systems  Physical Exam Updated Vital Signs BP (!) 188/93   Pulse (!) 58   Temp 98.4 F (36.9 C) (Oral)   Resp 14   LMP 10/01/2016   SpO2 99%  Physical Exam Constitutional:      General: She is not in acute  distress. HENT:     Head: Normocephalic and atraumatic.  Eyes:     Conjunctiva/sclera: Conjunctivae normal.     Pupils: Pupils are equal, round, and reactive to light.  Cardiovascular:     Rate and Rhythm: Normal rate and regular rhythm.  Pulmonary:     Effort: Pulmonary effort is normal. No respiratory distress.  Abdominal:     General: There is no distension.     Tenderness: There is no abdominal tenderness.  Skin:    General: Skin is warm and dry.  Neurological:     General: No focal deficit present.     Mental Status: She is alert and oriented to person, place, and time. Mental status is at baseline.     Sensory: No sensory deficit.     Motor: No weakness.  Psychiatric:        Mood and Affect: Mood normal.        Behavior: Behavior normal.     ED Results / Procedures / Treatments   Labs (all labs ordered are listed, but only abnormal results are displayed) Labs Reviewed  BASIC METABOLIC PANEL - Abnormal; Notable for the following components:      Result Value   Potassium 3.3 (*)    All other components within normal limits  CBC  TROPONIN I (HIGH SENSITIVITY)    EKG EKG Interpretation  Date/Time:  Sunday November 14 2022 16:09:52 EDT Ventricular Rate:  65 PR Interval:  202 QRS Duration: 95 QT Interval:  385 QTC Calculation: 401 R Axis:   67 Text Interpretation: Sinus rhythm Borderline prolonged PR interval  Nonspecific repolarization abnormalities Confirmed by Alvester Chou [phone]) on 11/14/2022 6:00:30 PM  Radiology DG Chest 2 View  Result Date: 11/14/2022 CLINICAL DATA:  Hypertension.  Headache. EXAM: CHEST - 2 VIEW COMPARISON:  October 11, 2022 FINDINGS: The heart size is borderline to mildly enlarged. The hila and mediastinum are normal. No pneumothorax. No nodules or masses. No focal infiltrates. IMPRESSION: Borderline to mild cardiomegaly. No other abnormalities. Electronically Signed   By: Gerome Sam III M.D.   On: 11/14/2022 16:14     Procedures Procedures    Medications Ordered in ED Medications  cloNIDine (CATAPRES) tablet 0.3 mg (0.3 mg Oral Given 11/14/22 1743)    ED Course/ Medical Decision Making/ A&P Clinical Course as of 11/14/22 2340  Sun Nov 14, 2022  1747 Confirmed with pharmacy pt has had clonidine patch in the past, low likelihood of cross reactivity with documented sulfa allergy, will try here and monitor. [MT]    Clinical Course User Index [MT] Georgianne Gritz, Kermit Balo, MD  Medical Decision Making Amount and/or Complexity of Data Reviewed Labs: ordered. Radiology: ordered.  Risk Prescription drug management.   This patient presents to the ED with concern for elevated BP. She is otherwise asymptomatic at this time.  I ordered and personally interpreted labs.  The pertinent results include: No emergent findings.  External records reviewed.  The patient has had a cardiac catheterization performed several months ago, November 2023 at [facility], with no significant cardiovascular plaque or disease.  I have a lower suspicion at this time for acute coronary syndrome.  I ordered imaging studies including x-ray of the chest I independently visualized and interpreted imaging which showed mild cardiomegaly, no other emergent findings I agree with the radiologist interpretation  The patient was maintained on a cardiac monitor.  I personally viewed and interpreted the cardiac monitored which showed an underlying rhythm of: Sinus rhythm  Per my interpretation the patient's ECG shows sinus rhythm  I ordered medication including clonidine for HTN  I have reviewed the patients home medicines and have made adjustments as needed.  Added clonidine as a rescue medicine for home until she can f/u with her PCP  Test Considered: No indication for emergent imaging of the chest or the brain at this time.  Low suspicion for pulmonary embolism, aortic dissection, stroke, ICH.  After the  interventions noted above, I reevaluated the patient and found that they have: stayed the same  Social Determinants of Health: discussed importance of monitoring her blood pressure at home, which she will be doing.  Keeping a diary.  Dispostion:  After consideration of the diagnostic results and the patients response to treatment, I feel that the patent would benefit from close outpatient follow-up.         Final Clinical Impression(s) / ED Diagnoses Final diagnoses:  Hypertension, unspecified type    Rx / DC Orders ED Discharge Orders          Ordered    cloNIDine (CATAPRES) 0.2 MG tablet  3 times daily PRN        11/14/22 1856              Terald Sleeper, MD 11/14/22 2342

## 2022-11-14 NOTE — Discharge Instructions (Addendum)
Please schedule appointment soon as possible with your cardiologist your primary care doctor about your high blood pressure.  This should be monitored and managed by one of your primary doctors.  In the meantime, I did prescribe you a medicine called clonidine which you can take "as needed" for persistent high blood pressure.  You can take this medicine once every 8 hours, up to 3 times a day. Take if your systolic blood pressure or your top blood pressure numbers over 180 mmhg, or your diastolic blood pressure or your bottom number is over 100 mmhg.   Remember when your blood pressure is high to always try to rest for 5 to 10 minutes in a quiet room, sitting down, and rechecked the blood pressure on the other arm.  This is to confirm that your blood pressure is truly high.

## 2022-11-14 NOTE — ED Triage Notes (Addendum)
Pt arrives today with c/o HTN. Pt states she had a headache and had fatigue yesterday, took her BP meds, and felt better. Today, pt went to church who had a wellness event. BP was taken, 190/100. Pt has not had a headache today, but wanted to come in for eval of pressures. Pt states she has a feeling in her chest "like when you're excited about something"/

## 2022-11-15 ENCOUNTER — Telehealth: Payer: Self-pay | Admitting: Internal Medicine

## 2022-11-15 ENCOUNTER — Telehealth: Payer: Self-pay

## 2022-11-15 NOTE — Transitions of Care (Post Inpatient/ED Visit) (Signed)
   11/15/2022  Name: [name] MRN: [id number] DOB: 04-24-1966  Today's TOC FU Call Status: Today's TOC FU Call Status:: Successful TOC FU Call Competed TOC FU Call Complete Date: 11/15/22  Transition Care Management Follow-up Telephone Call Date of Discharge: 11/14/22 Discharge Facility: [facility]) Type of Discharge: Emergency Department Reason for ED Visit: Other: (hypertension) How have you been since you were released from the hospital?: Better Any questions or concerns?: No  Items Reviewed: Did you receive and understand the discharge instructions provided?: Yes Medications obtained and verified?: Yes (Medications Reviewed) Any new allergies since your discharge?: No Dietary orders reviewed?: Yes Do you have support at home?: Yes People in Home: spouse  Home Care and Equipment/Supplies: Were Home Health Services Ordered?: NA Any new equipment or medical supplies ordered?: NA  Functional Questionnaire: Do you need assistance with bathing/showering or dressing?: No Do you need assistance with meal preparation?: No Do you need assistance with eating?: No Do you have difficulty maintaining continence: No Do you need assistance with getting out of bed/getting out of a chair/moving?: No Do you have difficulty managing or taking your medications?: No  Follow up appointments reviewed: PCP Follow-up appointment confirmed?: Yes Date of PCP follow-up appointment?: 11/17/22 Follow-up Provider: Dr Jonny Ruiz [facility] Follow-up appointment confirmed?: NA Do you need transportation to your follow-up appointment?: No Do you understand care options if your condition(s) worsen?: Yes-patient verbalized understanding    SIGNATURE Karena Addison, LPN [facility] Nurse Health Advisor Direct Dial [phone]

## 2022-11-15 NOTE — Telephone Encounter (Signed)
Hello, pt wanting to switch from Dr. Jonny Ruiz to Dr. Lawerance Bach just confirming if this okay with both parties?

## 2022-11-15 NOTE — Telephone Encounter (Signed)
Ok with me 

## 2022-11-15 NOTE — Telephone Encounter (Signed)
I am not accepting new patients at this time.

## 2022-11-16 ENCOUNTER — Encounter: Payer: Self-pay | Admitting: Internal Medicine

## 2022-11-16 NOTE — Patient Instructions (Signed)
      Blood work was ordered.   The lab is on the first floor.    Medications changes include :       A referral was ordered for XXX.     Someone will call you to schedule an appointment.    No follow-ups on file.  

## 2022-11-16 NOTE — Progress Notes (Unsigned)
Subjective:    Patient ID: [name], female    DOB: 10-15-1965, 57 y.o.   MRN: [id number]     HPI [name] is here for follow up from the hospital.    ED 4/28 for high BP.  She did have a frontal headache the day prior which she felt was sinus - it has resolved by the next morning.  That day her BP was noticed to be high at church - advised by our office to go to the ED.   BP typically high around 170/? .  Has been taking her medications.  No symptoms in ED  - no HA, no CP.  She did have some funny feelings in her chest x a few days.  Potassium slightly low.  Other labs, EKG, cxr w/o acute changes.  Troponin neg.  Given clonidine 0.3 mg x 1.   Had cardiac cath in 2023 - normal coronary arteries.   Saw cardiology yesterday - increased hctz to 25 mg daily, switched metoprolol to coreg 12.5 mg bid, continued losartan 100 mg daily.  D/c clonidine.  Add aldac   Medications and allergies reviewed with patient and updated if appropriate.  Current Outpatient Medications on File Prior to Visit  Medication Sig Dispense Refill   amLODipine (NORVASC) 5 MG tablet Take 1 tablet (5 mg total) by mouth daily. 90 tablet 3   clonazePAM (KLONOPIN) 0.5 MG tablet Take one tablet by mouth daily as needed for severe anxiety 30 tablet 0   cloNIDine (CATAPRES) 0.2 MG tablet Take 1 tablet (0.2 mg total) by mouth 3 (three) times daily as needed for up to 30 doses. 30 tablet 0   famotidine (PEPCID) 20 MG tablet Take 1 tablet (20 mg total) by mouth 2 (two) times daily. 180 tablet 3   fexofenadine (ALLEGRA) 180 MG tablet Take 1 tablet by mouth once daily 90 tablet 0   Fluocinolone Acetonide Body 0.01 % OIL Apply 1 application topically 2 (two) times daily as needed (flaking of scalp).   11   hydrochlorothiazide (HYDRODIURIL) 25 MG tablet Take 1 tablet (25 mg total) by mouth daily. 90 tablet 3   hydrOXYzine (ATARAX) 25 MG tablet Take 1 tablet (25 mg total) by mouth 3 (three) times daily as needed. 90  tablet 1   linaclotide (LINZESS) 145 MCG CAPS capsule Take 1 capsule by mouth once daily 90 capsule 2   metoprolol succinate (TOPROL-XL) 50 MG 24 hr tablet Take 1 tablet (50 mg total) by mouth daily. Take with or immediately following a meal. 30 tablet 0   olmesartan (BENICAR) 40 MG tablet Take 1 tablet (40 mg total) by mouth daily. 30 tablet 0   pantoprazole (PROTONIX) 40 MG tablet Take 1 tablet (40 mg total) by mouth 2 (two) times daily. 180 tablet 3   potassium chloride (KLOR-CON) 10 MEQ tablet Take 2 tablets (20 mEq total) by mouth daily. 360 tablet 3   rosuvastatin (CRESTOR) 10 MG tablet Take 1 tablet (10 mg total) by mouth daily. 90 tablet 0   traZODone (DESYREL) 50 MG tablet Take 1 tablet (50 mg total) by mouth at bedtime. 30 tablet 1   Vilazodone HCl 20 MG TABS Take 1 tablet (20 mg total) by mouth daily after breakfast. 30 tablet 3   vortioxetine HBr (TRINTELLIX) 20 MG TABS tablet Take 0.5 tablets (10 mg total) by mouth daily. 30 tablet 1   [DISCONTINUED] fluticasone (FLONASE) 50 MCG/ACT nasal spray 2 sprays by Nasal route daily.  16 g 2   No current facility-administered medications on file prior to visit.     Review of Systems     Objective:  There were no vitals filed for this visit. BP Readings from Last 3 Encounters:  11/14/22 (!) 188/93  10/11/22 (!) 172/80  04/12/22 (!) 182/108   Wt Readings from Last 3 Encounters:  10/11/22 186 lb 2 oz (84.4 kg)  04/12/22 185 lb (83.9 kg)  08/27/21 182 lb (82.6 kg)   There is no height or weight on file to calculate BMI.    Physical Exam     Lab Results  Component Value Date   WBC 8.0 11/14/2022   HGB 12.9 11/14/2022   HCT 39.6 11/14/2022   PLT 195 11/14/2022   GLUCOSE 97 11/14/2022   CHOL 213 (H) 04/12/2022   TRIG 102.0 04/12/2022   HDL 46.70 04/12/2022   LDLDIRECT 156.0 04/17/2013   LDLCALC 145 (H) 04/12/2022   ALT 7 04/12/2022   AST 14 04/12/2022   NA 140 11/14/2022   K 3.3 (L) 11/14/2022   CL 100 11/14/2022    CREATININE 1.00 11/14/2022   BUN 12 11/14/2022   CO2 29 11/14/2022   TSH 0.94 08/27/2021   HGBA1C 6.0 04/12/2022   MICROALBUR 1.6 08/27/2021     Assessment & Plan:    See Problem List for Assessment and Plan of chronic medical problems.

## 2022-11-17 ENCOUNTER — Other Ambulatory Visit: Payer: Self-pay | Admitting: Internal Medicine

## 2022-11-17 ENCOUNTER — Ambulatory Visit: Payer: BC Managed Care – PPO | Admitting: Internal Medicine

## 2022-11-17 VITALS — BP 148/100 | HR 65 | Temp 98.8°F | Ht 61.0 in | Wt 190.0 lb

## 2022-11-17 DIAGNOSIS — E7800 Pure hypercholesterolemia, unspecified: Secondary | ICD-10-CM | POA: Diagnosis not present

## 2022-11-17 DIAGNOSIS — I10 Essential (primary) hypertension: Secondary | ICD-10-CM | POA: Diagnosis not present

## 2022-11-17 DIAGNOSIS — K219 Gastro-esophageal reflux disease without esophagitis: Secondary | ICD-10-CM

## 2022-11-17 MED ORDER — ROSUVASTATIN CALCIUM 10 MG PO TABS: 10.0000 mg | ORAL_TABLET | Freq: Every day | ORAL | 1 refills | Status: DC

## 2022-11-17 NOTE — Assessment & Plan Note (Signed)
Chronic GERD controlled Continue pepcid 20 mg bid, pantoprazole 40 mg bid

## 2022-11-17 NOTE — Assessment & Plan Note (Signed)
Chronic Continue crestor 10 mg daily - refilled today Has routine fu with PCP

## 2022-11-17 NOTE — Assessment & Plan Note (Signed)
Chronic Recent ED visit for elevated BP Saw cardiology yesterday - several changes made to medications which she has not yet started because the pharmacy did not fill them yesterday so will start today There are some discrepancies in their med list and ours - advised she update cardio she is on olmesartan not losartan Monitor BP at home  Stressed low sodium diet Start regular exercise, work on weight loss Has f/u with PCP

## 2022-11-18 MED ORDER — OLMESARTAN MEDOXOMIL 40 MG PO TABS: 40.0000 mg | ORAL_TABLET | Freq: Every day | ORAL | 1 refills | Status: DC

## 2022-12-16 ENCOUNTER — Other Ambulatory Visit: Payer: Self-pay | Admitting: Behavioral Health

## 2022-12-16 DIAGNOSIS — F331 Major depressive disorder, recurrent, moderate: Secondary | ICD-10-CM

## 2022-12-16 DIAGNOSIS — F411 Generalized anxiety disorder: Secondary | ICD-10-CM

## 2023-01-04 ENCOUNTER — Ambulatory Visit: Payer: BC Managed Care – PPO | Admitting: Behavioral Health

## 2023-01-08 ENCOUNTER — Other Ambulatory Visit: Payer: Self-pay | Admitting: Behavioral Health

## 2023-01-08 DIAGNOSIS — F331 Major depressive disorder, recurrent, moderate: Secondary | ICD-10-CM

## 2023-01-08 DIAGNOSIS — F411 Generalized anxiety disorder: Secondary | ICD-10-CM

## 2023-01-11 ENCOUNTER — Ambulatory Visit: Payer: BC Managed Care – PPO | Admitting: Internal Medicine

## 2023-01-25 ENCOUNTER — Ambulatory Visit: Payer: BC Managed Care – PPO | Admitting: Behavioral Health

## 2023-01-25 ENCOUNTER — Ambulatory Visit: Payer: BC Managed Care – PPO

## 2023-01-25 ENCOUNTER — Ambulatory Visit
Admission: RE | Admit: 2023-01-25 | Discharge: 2023-01-25 | Disposition: A | Discharge status: Home / Self Care | Payer: BC Managed Care – PPO | Source: Ambulatory Visit | Attending: Internal Medicine | Admitting: Internal Medicine

## 2023-02-09 ENCOUNTER — Other Ambulatory Visit: Payer: Self-pay | Admitting: Behavioral Health

## 2023-02-09 DIAGNOSIS — F411 Generalized anxiety disorder: Secondary | ICD-10-CM

## 2023-02-09 DIAGNOSIS — F331 Major depressive disorder, recurrent, moderate: Secondary | ICD-10-CM

## 2023-03-03 ENCOUNTER — Ambulatory Visit: Payer: BC Managed Care – PPO | Admitting: Behavioral Health

## 2023-03-20 ENCOUNTER — Other Ambulatory Visit: Payer: Self-pay | Admitting: Behavioral Health

## 2023-03-20 DIAGNOSIS — F331 Major depressive disorder, recurrent, moderate: Secondary | ICD-10-CM

## 2023-03-20 DIAGNOSIS — F411 Generalized anxiety disorder: Secondary | ICD-10-CM

## 2023-03-31 ENCOUNTER — Encounter: Payer: Self-pay | Admitting: Behavioral Health

## 2023-03-31 ENCOUNTER — Ambulatory Visit (INDEPENDENT_AMBULATORY_CARE_PROVIDER_SITE_OTHER): Payer: BC Managed Care – PPO | Admitting: Behavioral Health

## 2023-03-31 DIAGNOSIS — F331 Major depressive disorder, recurrent, moderate: Secondary | ICD-10-CM

## 2023-03-31 DIAGNOSIS — F411 Generalized anxiety disorder: Secondary | ICD-10-CM | POA: Diagnosis not present

## 2023-03-31 DIAGNOSIS — F99 Mental disorder, not otherwise specified: Secondary | ICD-10-CM | POA: Diagnosis not present

## 2023-03-31 DIAGNOSIS — F5105 Insomnia due to other mental disorder: Secondary | ICD-10-CM | POA: Diagnosis not present

## 2023-03-31 MED ORDER — HYDROXYZINE HCL 25 MG PO TABS: 25.0000 mg | ORAL_TABLET | Freq: Three times a day (TID) | ORAL | 3 refills | Status: DC | PRN

## 2023-03-31 MED ORDER — CLONAZEPAM 0.5 MG PO TABS
ORAL_TABLET | ORAL | 1 refills | Status: DC
Start: 2023-03-31 — End: 2023-10-07

## 2023-03-31 MED ORDER — TRAZODONE HCL 50 MG PO TABS
50.0000 mg | ORAL_TABLET | Freq: Every day | ORAL | 3 refills | Status: AC
Start: 2023-03-31 — End: ?

## 2023-03-31 MED ORDER — VILAZODONE HCL 20 MG PO TABS
20.0000 mg | ORAL_TABLET | Freq: Every day | ORAL | 3 refills | Status: DC
Start: 2023-03-31 — End: 2023-09-05

## 2023-03-31 NOTE — Progress Notes (Signed)
[facility]  Patient ID: [name],  MRN: [id number]  PCP: Corwin Levins, MD  Date of Evaluation: 03/31/2023 Time spent:20 minutes  Chief Complaint:  Chief Complaint   Depression; Anxiety; Follow-up; Medication Refill; Patient Education     HISTORY/CURRENT STATUS: HPI "[name]", 57 year old female presents to this office for follow up and medication management. Collateral information should be considered reliable. No psychosocial changes this visit.  She is liking the Viibryd and feels like she is improving with anxiety and depression.  She also is utilizing hydroxyzine at bedtime. Numerically rates depression at 2/10, and anxiety 2/10.  She is sleeping 6 to 7 hours per night but says that her sleep is broken.  She denies any history of mania, no psychosis, no auditory or visual hallucinations.  Denies SI and HI.  Patient states that she feels safe and verbally contracts for safety with this Clinical research associate.   Past psychiatric medication trials: Zoloft      Individual Medical History/ Review of Systems: Changes? :No   Allergies: Elemental sulfur, Other, and Sulfur  Current Medications:  Current Outpatient Medications:    carvedilol (COREG) 12.5 MG tablet, Take by mouth., Disp: , Rfl:    clonazePAM (KLONOPIN) 0.5 MG tablet, Take one tablet by mouth daily as needed for severe anxiety, Disp: 30 tablet, Rfl: 1   estradiol (VIVELLE-DOT) 0.1 MG/24HR patch, 1 patch 2 (two) times a week., Disp: , Rfl:    famotidine (PEPCID) 20 MG tablet, Take 1 tablet (20 mg total) by mouth 2 (two) times daily., Disp: 180 tablet, Rfl: 3   fexofenadine (ALLEGRA) 180 MG tablet, Take 1 tablet by mouth once daily, Disp: 90 tablet, Rfl: 0   hydrochlorothiazide (HYDRODIURIL) 25 MG tablet, Take 1 tablet (25 mg total) by mouth daily. (Patient taking differently: Take 25 mg by mouth 2 (two) times daily.), Disp: 90 tablet, Rfl: 3   hydrOXYzine (ATARAX) 25 MG tablet, Take 1 tablet (25 mg total) by mouth  3 (three) times daily as needed., Disp: 90 tablet, Rfl: 3   linaclotide (LINZESS) 145 MCG CAPS capsule, Take 1 capsule by mouth once daily, Disp: 90 capsule, Rfl: 2   olmesartan (BENICAR) 40 MG tablet, Take 1 tablet (40 mg total) by mouth daily. Must keep June appt for future refills, Disp: 30 tablet, Rfl: 1   pantoprazole (PROTONIX) 40 MG tablet, Take 1 tablet (40 mg total) by mouth 2 (two) times daily., Disp: 180 tablet, Rfl: 3   potassium chloride (KLOR-CON) 10 MEQ tablet, Take 2 tablets (20 mEq total) by mouth daily. (Patient taking differently: Take 20 mEq by mouth in the morning, at noon, in the evening, and at bedtime.), Disp: 360 tablet, Rfl: 3   rosuvastatin (CRESTOR) 10 MG tablet, Take 1 tablet (10 mg total) by mouth daily., Disp: 90 tablet, Rfl: 1   traZODone (DESYREL) 50 MG tablet, Take 1 tablet (50 mg total) by mouth at bedtime., Disp: 30 tablet, Rfl: 3   Vilazodone HCl 20 MG TABS, Take 1 tablet (20 mg total) by mouth daily after breakfast., Disp: 30 tablet, Rfl: 3 Medication Side Effects: none  Family Medical/ Social History: Changes? No  MENTAL HEALTH EXAM:  Last menstrual period 10/01/2016.There is no height or weight on file to calculate BMI.  General Appearance: Casual, Neat, and Well Groomed  Eye Contact:  Good  Speech:  Clear and Coherent  Volume:  Normal  Mood:  NA  Affect:  Appropriate  Thought Process:  Coherent  Orientation:  Full (Time, Place,  and Person)  Thought Content: Logical   Suicidal Thoughts:  No  Homicidal Thoughts:  No  Memory:  WNL  Judgement:  Good  Insight:  Good  Psychomotor Activity:  Normal  Concentration:  Concentration: Good  Recall:  Good  Fund of Knowledge: Good  Language: Good  Assets:  Desire for Improvement  ADL's:  Intact  Cognition: WNL  Prognosis:  Good    DIAGNOSES:    ICD-10-CM   1. Generalized anxiety disorder  F41.1 Vilazodone HCl 20 MG TABS    hydrOXYzine (ATARAX) 25 MG tablet    clonazePAM (KLONOPIN) 0.5 MG tablet     2. Major depressive disorder, recurrent episode, moderate (HCC)  F33.1 Vilazodone HCl 20 MG TABS    hydrOXYzine (ATARAX) 25 MG tablet    3. Insomnia due to other mental disorder  F51.05 traZODone (DESYREL) 50 MG tablet   F99       Receiving Psychotherapy: No    RECOMMENDATIONS:   Greater than 50% of  30 min face to face time with patient was spent on counseling and coordination of care. Discussed significant improvement with anxiety and depression. So far she is happy with Viibryd and says that she has improved about 80 %. She is requesting no medication changes this visit.    We agreed to:   To continue Viibryd 20 mg daily, must take with food.  Continue hydroxyzine 25 mg three times daily as needed. Continue Klonopin 0.5 mg daily only for severe anxiety Continue trazodone 50 mg at bedtime daily Educated on good sleep hygiene  Will report worsening symptoms promptly To follow-up in 4 months to reassess Provided emergency contact information   Reviewed PDMP       Joan Flores, NP

## 2023-05-18 ENCOUNTER — Encounter: Payer: Self-pay | Admitting: Emergency Medicine

## 2023-05-18 ENCOUNTER — Ambulatory Visit (INDEPENDENT_AMBULATORY_CARE_PROVIDER_SITE_OTHER): Payer: BC Managed Care – PPO

## 2023-05-18 ENCOUNTER — Ambulatory Visit: Payer: BC Managed Care – PPO | Admitting: Emergency Medicine

## 2023-05-18 VITALS — BP 124/82 | HR 77 | Temp 98.6°F | Ht 61.0 in | Wt 185.0 lb

## 2023-05-18 DIAGNOSIS — R051 Acute cough: Secondary | ICD-10-CM

## 2023-05-18 DIAGNOSIS — R6889 Other general symptoms and signs: Secondary | ICD-10-CM | POA: Insufficient documentation

## 2023-05-18 DIAGNOSIS — J22 Unspecified acute lower respiratory infection: Secondary | ICD-10-CM | POA: Diagnosis not present

## 2023-05-18 LAB — COMPREHENSIVE METABOLIC PANEL
ALT: 7 U/L (ref 0–35)
AST: 19 U/L (ref 0–37)
Albumin: 4.5 g/dL (ref 3.5–5.2)
Alkaline Phosphatase: 57 U/L (ref 39–117)
BUN: 8 mg/dL (ref 6–23)
CO2: 34 meq/L — ABNORMAL HIGH (ref 19–32)
Calcium: 9.6 mg/dL (ref 8.4–10.5)
Chloride: 97 meq/L (ref 96–112)
Creatinine, Ser: 0.84 mg/dL (ref 0.40–1.20)
GFR: 78.52 mL/min (ref >60.00)
Glucose, Bld: 89 mg/dL (ref 70–99)
Potassium: 3.3 meq/L — ABNORMAL LOW (ref 3.5–5.1)
Sodium: 138 meq/L (ref 135–145)
Total Bilirubin: 0.6 mg/dL (ref 0.2–1.2)
Total Protein: 8.3 g/dL (ref 6.0–8.3)

## 2023-05-18 LAB — CBC WITH DIFFERENTIAL/PLATELET
Basophils Absolute: 0 10*3/uL (ref 0.0–0.1)
Basophils Relative: 0.3 % (ref 0.0–3.0)
Eosinophils Absolute: 0.1 10*3/uL (ref 0.0–0.7)
Eosinophils Relative: 1.2 % (ref 0.0–5.0)
HCT: 39.2 % (ref 36.0–46.0)
Hemoglobin: 12.8 g/dL (ref 12.0–15.0)
Lymphocytes Relative: 33.2 % (ref 12.0–46.0)
Lymphs Abs: 2.8 10*3/uL (ref 0.7–4.0)
MCHC: 32.6 g/dL (ref 30.0–36.0)
MCV: 91 fL (ref 78.0–100.0)
Monocytes Absolute: 0.8 10*3/uL (ref 0.1–1.0)
Monocytes Relative: 9.3 % (ref 3.0–12.0)
Neutro Abs: 4.7 10*3/uL (ref 1.4–7.7)
Neutrophils Relative %: 56 % (ref 43.0–77.0)
Platelets: 219 10*3/uL (ref 150.0–400.0)
RBC: 4.31 Mil/uL (ref 3.87–5.11)
RDW: 13.9 % (ref 11.5–15.5)
WBC: 8.5 10*3/uL (ref 4.0–10.5)

## 2023-05-18 MED ORDER — AZITHROMYCIN 250 MG PO TABS
ORAL_TABLET | ORAL | 0 refills | Status: DC
Start: 2023-05-18 — End: 2023-11-23

## 2023-05-18 MED ORDER — HYDROCODONE BIT-HOMATROP MBR 5-1.5 MG/5ML PO SOLN
5.0000 mL | Freq: Every evening | ORAL | 0 refills | Status: DC | PRN
Start: 2023-05-18 — End: 2023-11-23

## 2023-05-18 NOTE — Assessment & Plan Note (Signed)
Cough management discussed. Recommend over-the-counter Mucinex DM and cough drops Advised to stay well-hydrated May take Hycodan syrup at bedtime as needed

## 2023-05-18 NOTE — Patient Instructions (Signed)
Acute Bronchitis, Adult  Acute bronchitis is when air tubes in the lungs (bronchi) suddenly get swollen. The condition can make it hard for you to breathe. In adults, acute bronchitis usually goes away within 2 weeks. A cough caused by bronchitis may last up to 3 weeks. Smoking, allergies, and asthma can make the condition worse. What are the causes? Germs that cause cold and flu (viruses). The most common cause of this condition is the virus that causes the common cold. Bacteria. Substances that bother (irritate) the lungs, including: Smoke from cigarettes and other types of tobacco. Dust and pollen. Fumes from chemicals, gases, or burned fuel. Indoor or outdoor air pollution. What increases the risk? A weak body's defense system. This is also called the immune system. Any condition that affects your lungs and breathing, such as asthma. What are the signs or symptoms? A cough. Coughing up clear, yellow, or green mucus. Making high-pitched whistling sounds when you breathe, most often when you breathe out (wheezing). Runny or stuffy nose. Having too much mucus in your lungs (chest congestion). Shortness of breath. Body aches. A sore throat. How is this treated? Acute bronchitis may go away over time without treatment. Your doctor may tell you to: Drink more fluids. This will help thin your mucus so it is easier to cough up. Use a device that gets medicine into your lungs (inhaler). Use a vaporizer or a humidifier. These are machines that add water to the air. This helps with coughing and poor breathing. Take a medicine that thins mucus and helps clear it from your lungs. Take a medicine that prevents or stops coughing. It is not common to take an antibiotic medicine for this condition. Follow these instructions at home:  Take over-the-counter and prescription medicines only as told by your doctor. Use an inhaler, vaporizer, or humidifier as told by your doctor. Take two teaspoons  (10 mL) of honey at bedtime. This helps lessen your coughing at night. Drink enough fluid to keep your pee (urine) pale yellow. Do not smoke or use any products that contain nicotine or tobacco. If you need help quitting, ask your doctor. Get a lot of rest. Return to your normal activities when your doctor says that it is safe. Keep all follow-up visits. How is this prevented?  Wash your hands often with soap and water for at least 20 seconds. If you cannot use soap and water, use hand sanitizer. Avoid contact with people who have cold symptoms. Try not to touch your mouth, nose, or eyes with your hands. Avoid breathing in smoke or chemical fumes. Make sure to get the flu shot every year. Contact a doctor if: Your symptoms do not get better in 2 weeks. You have trouble coughing up the mucus. Your cough keeps you awake at night. You have a fever. Get help right away if: You cough up blood. You have chest pain. You have very bad shortness of breath. You faint or keep feeling like you are going to faint. You have a very bad headache. Your fever or chills get worse. These symptoms may be an emergency. Get help right away. Call your local emergency services (911 in the [place] Do not wait to see if the symptoms will go away. Do not drive yourself to the hospital. Summary Acute bronchitis is when air tubes in the lungs (bronchi) suddenly get swollen. In adults, acute bronchitis usually goes away within 2 weeks. Drink more fluids. This will help thin your mucus so it is easier  to cough up. Take over-the-counter and prescription medicines only as told by your doctor. Contact a doctor if your symptoms do not improve after 2 weeks of treatment. This information is not intended to replace advice given to you by your health care provider. Make sure you discuss any questions you have with your health care provider. Document Revised: 11/05/2020 Document Reviewed: 11/05/2020 Elsevier Patient  Education  2024 [employer].

## 2023-05-18 NOTE — Progress Notes (Signed)
[name] 57 y.o.   Chief Complaint  Patient presents with   Cough    Has been sick for about an month, body aches and cough with mucus     HISTORY OF PRESENT ILLNESS: Acute problem visit today. This is a 57 y.o. female complaining of flulike symptoms that started about 4 weeks ago progressively getting worse. Mostly complaining of productive cough. Works with children. Denies difficulty breathing.  Able to eat and drink but appetite is down.  Denies nausea or vomiting.  Denies abdominal pain or diarrhea. Denies chest pain or difficulty breathing. No other associated symptoms. No other complaints or medical concerns today.  Cough Associated symptoms include a sore throat. Pertinent negatives include no chest pain, chills, fever, headaches, hemoptysis, rash or shortness of breath.     Prior to Admission medications   Medication Sig Start Date End Date Taking? Authorizing Provider  carvedilol (COREG) 12.5 MG tablet Take by mouth. 11/16/22 11/16/23 Yes [provider]  clonazePAM (KLONOPIN) 0.5 MG tablet Take one tablet by mouth daily as needed for severe anxiety 03/31/23  Yes White, Arlys John A, NP  estradiol (VIVELLE-DOT) 0.1 MG/24HR patch 1 patch 2 (two) times a week. 11/02/22  Yes [provider]  famotidine (PEPCID) 20 MG tablet Take 1 tablet (20 mg total) by mouth 2 (two) times daily. 07/07/20  Yes Corwin Levins, MD  fexofenadine [facility]) 180 MG tablet Take 1 tablet by mouth once daily 12/31/20  Yes Corwin Levins, MD  hydrochlorothiazide (HYDRODIURIL) 25 MG tablet Take 1 tablet (25 mg total) by mouth daily. Patient taking differently: Take 25 mg by mouth 2 (two) times daily. 04/12/22  Yes Corwin Levins, MD  hydrOXYzine (ATARAX) 25 MG tablet Take 1 tablet (25 mg total) by mouth 3 (three) times daily as needed. 03/31/23  Yes Joan Flores, NP  linaclotide [facility]) 145 MCG CAPS capsule Take 1 capsule by mouth once daily 12/02/21  Yes Corwin Levins, MD   olmesartan (BENICAR) 40 MG tablet Take 1 tablet (40 mg total) by mouth daily. Must keep June appt for future refills 11/18/22  Yes Corwin Levins, MD  pantoprazole (PROTONIX) 40 MG tablet Take 1 tablet (40 mg total) by mouth 2 (two) times daily. 04/10/21  Yes Corwin Levins, MD  potassium chloride (KLOR-CON) 10 MEQ tablet Take 2 tablets (20 mEq total) by mouth daily. Patient taking differently: Take 20 mEq by mouth in the morning, at noon, in the evening, and at bedtime. 06/09/22  Yes Corwin Levins, MD  rosuvastatin (CRESTOR) 10 MG tablet Take 1 tablet (10 mg total) by mouth daily. 11/17/22  Yes Burns, Bobette Mo, MD  traZODone (DESYREL) 50 MG tablet Take 1 tablet (50 mg total) by mouth at bedtime. 03/31/23  Yes Joan Flores, NP  Vilazodone HCl 20 MG TABS Take 1 tablet (20 mg total) by mouth daily after breakfast. 03/31/23  Yes White, Brian A, NP  fluticasone (FLONASE) 50 MCG/ACT nasal spray 2 sprays by Nasal route daily. 10/12/10 09/27/11  Corwin Levins, MD    Allergies  Allergen Reactions   Elemental Sulfur Hives   Other     POLLEN AND DUST   Sulfur Other (See Comments)    Patient Active Problem List   Diagnosis Date Noted   Urinary frequency 10/13/2022   Right flank pain 10/13/2022   Obesity (BMI 30.0-34.9) 04/12/2022   Vitamin D deficiency 04/10/2021   Prediabetes 10/12/2020   Venous insufficiency 10/12/2020   DOE (  dyspnea on exertion) 06/24/2020   Primary hypertension 06/24/2020   Acute bilateral low back pain with bilateral sciatica 06/24/2020   Abnormal MRI, cervical spine 12/08/2019   Cervicalgia 12/08/2019   ETD (Eustachian tube dysfunction), bilateral 08/27/2019   Hearing loss 08/27/2019   B12 deficiency 06/02/2019   Vertigo 04/20/2019   Closed head injury 04/18/2019   Dysphagia 11/13/2018   Epigastric pain 11/13/2018   Anemia 11/13/2018   Asthma 10/10/2018   Neck pain on left side 03/21/2018   Nasal turbinate hypertrophy 05/17/2017   Deviated septum 04/19/2017   Bad breath  05/25/2016   Abnormal TSH 05/09/2015   Rhinitis, chronic 06/19/2014   Other and unspecified ovarian cyst 05/30/2013   Chronic constipation 04/17/2013   Encounter for well adult exam with abnormal findings 10/12/2010   Hyperlipidemia 03/26/2007   Anxiety state 03/26/2007   Depression 03/26/2007   Chronic maxillary sinusitis 03/26/2007   GERD 03/26/2007   IBS 03/26/2007   History of colonic polyps 03/26/2007   Laryngopharyngeal reflux (LPR) 03/26/2007    Past Medical History:  Diagnosis Date   Allergic rhinitis    Allergy    Anxiety    Depression    GERD (gastroesophageal reflux disease)    Hyperlipidemia    Hyperplastic colon polyp 10/2003   Hypertension    Menorrhagia    Obesity    PONV (postoperative nausea and vomiting)    RLS (restless legs syndrome)     Past Surgical History:  Procedure Laterality Date   COLONOSCOPY     CYSTOSCOPY N/A 10/12/2016   Procedure: CYSTOSCOPY;  Surgeon: Dara Lords, MD;  Location: [facility];  Service: Gynecology;  Laterality: N/A;   ENDOMETRIAL ABLATION  2007   Dr[facility] Richardson Dopp at [facility]   FOOT SURGERY     LAPAROSCOPIC VAGINAL HYSTERECTOMY WITH SALPINGECTOMY Bilateral 10/12/2016   Procedure: LAPAROSCOPIC ASSISTED VAGINAL HYSTERECTOMY WITH SALPINGECTOMY;  Surgeon: Dara Lords, MD;  Location: [facility];  Service: Gynecology;  Laterality: Bilateral;  request to follow first case at around 10:30am.  Requests 2 1/2 hours for the case.   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   Drug use: No   Sexual activity: Not Currently    Birth control/protection: Surgical, Condom    Comment: tubal ligation,INTERCOUSRE AGE 77, SEXUAL PARTNERS QUESTION DECLINED  Other Topics Concern   Not on file  Social History Narrative   Lives in [place], [place] alone. Likes to walk, womens empowerment group once per month.    Social Determinants of Health   Financial Resource Strain: Not on file  Food Insecurity: Not on file  Transportation Needs: Not on file  Physical Activity: Not on file  Stress: Not on file  Social Connections: Not on file  Intimate Partner Violence: Not on file    Family  History  Problem Relation Age of Onset   Cancer Mother        colon cancer at 31 yo   Colon cancer Mother    Drug abuse Father    Colon polyps Father    Breast cancer Maternal Aunt    Schizophrenia Paternal Aunt    Drug abuse Maternal Uncle    Heart attack Maternal Grandfather    Hyperlipidemia Other    Hypertension Other    Esophageal cancer Neg Hx    Rectal cancer Neg Hx    Stomach cancer Neg Hx      Review of Systems  Constitutional: Negative.  Negative for chills and fever.  HENT:  Positive for congestion and sore throat.   Respiratory:  Positive for cough and sputum production. Negative for hemoptysis and shortness of breath.   Cardiovascular: Negative.  Negative for chest pain and palpitations.  Gastrointestinal:  Negative for abdominal pain, diarrhea, nausea and vomiting.  Genitourinary: Negative.  Negative for dysuria and hematuria.  Skin: Negative.  Negative for rash.  Neurological: Negative.  Negative for dizziness and headaches.  All other systems reviewed and are negative.   Vitals:   05/18/23 1526  BP: 124/82  Pulse: 77  Temp: 98.6 F (37 C)  SpO2: 98%    Physical Exam Vitals reviewed.  Constitutional:      Appearance: Normal  appearance.  HENT:     Head: Normocephalic.     Right Ear: Tympanic membrane, ear canal and external ear normal.     Left Ear: Tympanic membrane, ear canal and external ear normal.     Mouth/Throat:     Mouth: Mucous membranes are moist.     Pharynx: Oropharynx is clear.  Eyes:     Extraocular Movements: Extraocular movements intact.     Conjunctiva/sclera: Conjunctivae normal.     Pupils: Pupils are equal, round, and reactive to light.  Cardiovascular:     Rate and Rhythm: Normal rate and regular rhythm.     Pulses: Normal pulses.     Heart sounds: Normal heart sounds.  Pulmonary:     Effort: Pulmonary effort is normal.     Breath sounds: Normal breath sounds.  Musculoskeletal:     Cervical back: No tenderness.  Lymphadenopathy:     Cervical: No cervical adenopathy.  Skin:    General: Skin is warm and dry.     Capillary Refill: Capillary refill takes less than 2 seconds.  Neurological:     General: No focal deficit present.     Mental Status: She is alert and oriented to person, place, and time.  Psychiatric:        Mood and Affect: Mood normal.        Behavior: Behavior normal.      ASSESSMENT & PLAN: A total of 34 minutes was spent with the patient and counseling/coordination of care regarding preparing for this visit, review of most recent office visit notes, review of chronic medical conditions under management, review of all medications, diagnosis of lower respiratory infection and need for antibiotics, need for blood work and chest x-ray today, symptom management, prognosis, documentation, and need for follow-up if no better or worse during the next couple weeks.  Problem List Items Addressed This Visit       Respiratory   Lower respiratory infection - Primary    Clinically stable.  No signs of pneumonia. However of 4 weeks duration and progressively getting worse Viral upper respiratory infection now with secondary bacterial infection. Recommend  daily  azithromycin for 5 days Advised to rest and stay well-hydrated ED precautions given Symptom management discussed      Relevant Medications   azithromycin (ZITHROMAX) 250 MG tablet   Other Relevant Orders   CBC with Differential/Platelet   Comprehensive metabolic panel   DG Chest 2 View     Other   Acute cough    Cough management discussed. Recommend over-the-counter Mucinex DM and cough drops Advised to stay well-hydrated May take Hycodan syrup at bedtime as needed      Relevant Medications   HYDROcodone bit-homatropine (HYCODAN) 5-1.5 MG/5ML syrup   Other Relevant Orders   CBC with Differential/Platelet   Comprehensive metabolic panel   DG Chest 2 View   Flu-like symptoms    Advised to rest and stay well-hydrated Symptom management discussed Tylenol and or Advil as needed      Relevant Orders   CBC with Differential/Platelet   Comprehensive metabolic panel   DG Chest 2 View   Patient Instructions  Acute Bronchitis, Adult  Acute bronchitis is when air tubes in the lungs (bronchi) suddenly get swollen. The condition can make it hard for you to breathe. In adults, acute bronchitis usually goes away within 2 weeks. A cough caused by bronchitis may last up to 3 weeks. Smoking, allergies, and asthma can make the condition worse. What are the causes? Germs that cause cold and flu (viruses). The most common cause of this condition is the virus that causes the common cold. Bacteria. Substances that bother (irritate) the lungs, including: Smoke from cigarettes and other types of tobacco. Dust and pollen. Fumes from chemicals, gases, or burned fuel. Indoor or outdoor air pollution. What increases the risk? A weak body's defense system. This is also called the immune system. Any condition that affects your lungs and breathing, such as asthma. What are the signs or symptoms? A cough. Coughing up clear, yellow, or green mucus. Making high-pitched whistling sounds when you  breathe, most often when you breathe out (wheezing). Runny or stuffy nose. Having too much mucus in your lungs (chest congestion). Shortness of breath. Body aches. A sore throat. How is this treated? Acute bronchitis may go away over time without treatment. Your doctor may tell you to: Drink more fluids. This will help thin your mucus so it is easier to cough up. Use a device that gets medicine into your lungs (inhaler). Use a vaporizer or a humidifier. These are machines that add water to the air. This helps with coughing and poor breathing. Take a medicine that thins mucus and helps clear it from your lungs. Take a medicine that prevents or stops coughing. It is not common to take an antibiotic medicine for this condition. Follow these instructions at home:  Take over-the-counter and prescription medicines only as told by your doctor. Use an inhaler, vaporizer, or humidifier as told by your doctor. Take two teaspoons (10 mL) of honey at bedtime. This helps lessen your coughing at night. Drink enough fluid to keep your pee (urine) pale yellow. Do not smoke or use any products that contain nicotine or tobacco. If you need help quitting, ask your doctor. Get a lot of rest. Return to your normal activities when your doctor says that it is safe. Keep all follow-up visits. How is this prevented?  Wash your hands often with soap and water for at least 20 seconds. If you cannot use soap and water, use hand sanitizer. Avoid contact with people who have cold symptoms. Try not to  touch your mouth, nose, or eyes with your hands. Avoid breathing in smoke or chemical fumes. Make sure to get the flu shot every year. Contact a doctor if: Your symptoms do not get better in 2 weeks. You have trouble coughing up the mucus. Your cough keeps you awake at night. You have a fever. Get help right away if: You cough up blood. You have chest pain. You have very bad shortness of breath. You faint or  keep feeling like you are going to faint. You have a very bad headache. Your fever or chills get worse. These symptoms may be an emergency. Get help right away. Call your local emergency services (911 in the [place] Do not wait to see if the symptoms will go away. Do not drive yourself to the hospital. Summary Acute bronchitis is when air tubes in the lungs (bronchi) suddenly get swollen. In adults, acute bronchitis usually goes away within 2 weeks. Drink more fluids. This will help thin your mucus so it is easier to cough up. Take over-the-counter and prescription medicines only as told by your doctor. Contact a doctor if your symptoms do not improve after 2 weeks of treatment. This information is not intended to replace advice given to you by your health care provider. Make sure you discuss any questions you have with your health care provider. Document Revised: 11/05/2020 Document Reviewed: 11/05/2020 Elsevier Patient Education  2024 [employer]     Edwina Barth, MD [facility]

## 2023-05-18 NOTE — Assessment & Plan Note (Signed)
Clinically stable.  No signs of pneumonia. However of 4 weeks duration and progressively getting worse Viral upper respiratory infection now with secondary bacterial infection. Recommend daily azithromycin for 5 days Advised to rest and stay well-hydrated ED precautions given Symptom management discussed

## 2023-05-18 NOTE — Assessment & Plan Note (Signed)
Advised to rest and stay well-hydrated Symptom management discussed Tylenol and or Advil as needed

## 2023-06-29 ENCOUNTER — Other Ambulatory Visit: Payer: Self-pay | Admitting: Internal Medicine

## 2023-06-29 ENCOUNTER — Other Ambulatory Visit: Payer: Self-pay

## 2023-06-30 ENCOUNTER — Ambulatory Visit (INDEPENDENT_AMBULATORY_CARE_PROVIDER_SITE_OTHER): Payer: Self-pay | Admitting: Behavioral Health

## 2023-06-30 DIAGNOSIS — Z0389 Encounter for observation for other suspected diseases and conditions ruled out: Secondary | ICD-10-CM

## 2023-06-30 NOTE — Progress Notes (Signed)
PT did not show for scheduled appointment and did not provide 24-hour notice as required.  Additional fees to be assessed

## 2023-08-23 NOTE — Progress Notes (Signed)
.  POV

## 2023-09-04 ENCOUNTER — Other Ambulatory Visit: Payer: Self-pay | Admitting: Behavioral Health

## 2023-09-04 DIAGNOSIS — F331 Major depressive disorder, recurrent, moderate: Secondary | ICD-10-CM

## 2023-09-04 DIAGNOSIS — F411 Generalized anxiety disorder: Secondary | ICD-10-CM

## 2023-10-07 ENCOUNTER — Encounter: Payer: Self-pay | Admitting: Behavioral Health

## 2023-10-07 ENCOUNTER — Ambulatory Visit (INDEPENDENT_AMBULATORY_CARE_PROVIDER_SITE_OTHER): Payer: Self-pay | Admitting: Behavioral Health

## 2023-10-07 DIAGNOSIS — F331 Major depressive disorder, recurrent, moderate: Secondary | ICD-10-CM

## 2023-10-07 DIAGNOSIS — F411 Generalized anxiety disorder: Secondary | ICD-10-CM

## 2023-10-07 MED ORDER — VILAZODONE HCL 20 MG PO TABS
20.0000 mg | ORAL_TABLET | Freq: Every day | ORAL | 0 refills | Status: DC
Start: 2023-10-07 — End: 2024-01-13

## 2023-10-07 MED ORDER — HYDROXYZINE HCL 25 MG PO TABS
25.0000 mg | ORAL_TABLET | Freq: Three times a day (TID) | ORAL | 3 refills | Status: DC | PRN
Start: 2023-10-07 — End: 2024-04-09

## 2023-10-07 MED ORDER — CLONAZEPAM 0.5 MG PO TABS: ORAL_TABLET | ORAL | 1 refills | Status: DC

## 2023-10-07 NOTE — Progress Notes (Signed)
 [facility]  Patient ID: [name],  MRN: [id number]  PCP: Corwin Levins, MD  Date of Evaluation: 10/07/2023 Time spent:30 minutes  Chief Complaint:  Chief Complaint   Depression; Anxiety; Follow-up; Medication Refill; Patient Education     HISTORY/CURRENT STATUS: HPI "[name]", 58 year old female presents to this office for follow up and medication management. Collateral information should be considered reliable. No psychosocial changes this visit.  She is liking the Viibryd and feels like she is improving with anxiety and depression.  She also is utilizing hydroxyzine at bedtime but stopped Trazodone. Numerically rates depression at 2/10, and anxiety 2/10.  Sleep has improved since now on CPAP.  She denies any history of mania, no psychosis, no auditory or visual hallucinations.  Denies SI and HI.  Patient states that she feels safe and verbally contracts for safety with this Clinical research associate.   Past psychiatric medication trials: Zoloft    Individual Medical History/ Review of Systems: Changes? :No   Allergies: Elemental sulfur, Other, and Sulfur  Current Medications:  Current Outpatient Medications:    azithromycin (ZITHROMAX) 250 MG tablet, Sig as indicated, Disp: 6 tablet, Rfl: 0   carvedilol (COREG) 12.5 MG tablet, Take by mouth., Disp: , Rfl:    clonazePAM (KLONOPIN) 0.5 MG tablet, Take one tablet by mouth daily as needed for severe anxiety, Disp: 30 tablet, Rfl: 1   estradiol (VIVELLE-DOT) 0.1 MG/24HR patch, 1 patch 2 (two) times a week., Disp: , Rfl:    famotidine (PEPCID) 20 MG tablet, Take 1 tablet (20 mg total) by mouth 2 (two) times daily., Disp: 180 tablet, Rfl: 3   fexofenadine (ALLEGRA) 180 MG tablet, Take 1 tablet by mouth once daily, Disp: 90 tablet, Rfl: 0   hydrochlorothiazide (HYDRODIURIL) 25 MG tablet, Take 1 tablet (25 mg total) by mouth daily. (Patient taking differently: Take 25 mg by mouth 2 (two) times daily.), Disp: 90 tablet, Rfl: 3    HYDROcodone bit-homatropine (HYCODAN) 5-1.5 MG/5ML syrup, Take 5 mLs by mouth at bedtime as needed for cough., Disp: 120 mL, Rfl: 0   hydrOXYzine (ATARAX) 25 MG tablet, Take 1 tablet (25 mg total) by mouth 3 (three) times daily as needed., Disp: 90 tablet, Rfl: 3   linaclotide (LINZESS) 145 MCG CAPS capsule, Take 1 capsule by mouth once daily, Disp: 90 capsule, Rfl: 2   olmesartan (BENICAR) 40 MG tablet, Take 1 tablet (40 mg total) by mouth daily. Must keep June appt for future refills, Disp: 30 tablet, Rfl: 1   pantoprazole (PROTONIX) 40 MG tablet, Take 1 tablet (40 mg total) by mouth 2 (two) times daily., Disp: 180 tablet, Rfl: 3   potassium chloride (KLOR-CON) 10 MEQ tablet, Take 2 tablets by mouth once daily, Disp: 180 tablet, Rfl: 3   rosuvastatin (CRESTOR) 10 MG tablet, Take 1 tablet (10 mg total) by mouth daily., Disp: 90 tablet, Rfl: 1   traZODone (DESYREL) 50 MG tablet, Take 1 tablet (50 mg total) by mouth at bedtime., Disp: 30 tablet, Rfl: 3   Vilazodone HCl 20 MG TABS, Take 1 tablet (20 mg total) by mouth daily after breakfast., Disp: 90 tablet, Rfl: 0 Medication Side Effects: none  Family Medical/ Social History: Changes? No  MENTAL HEALTH EXAM:  Last menstrual period 10/01/2016.There is no height or weight on file to calculate BMI.  General Appearance: Casual  Eye Contact:  Good  Speech:  Clear and Coherent  Volume:  Normal  Mood:  NA  Affect:  Appropriate  Thought Process:  Coherent  Orientation:  Full (Time, Place, and Person)  Thought Content: Logical   Suicidal Thoughts:  No  Homicidal Thoughts:  No  Memory:  WNL  Judgement:  Good  Insight:  Good  Psychomotor Activity:  Normal  Concentration:  Concentration: Good  Recall:  Good  Fund of Knowledge: Good  Language: Good  Assets:  Desire for Improvement  ADL's:  Intact  Cognition: WNL  Prognosis:  Good    DIAGNOSES:    ICD-10-CM   1. Generalized anxiety disorder  F41.1 Vilazodone HCl 20 MG TABS    clonazePAM  (KLONOPIN) 0.5 MG tablet    hydrOXYzine (ATARAX) 25 MG tablet    2. Major depressive disorder, recurrent episode, moderate (HCC)  F33.1 Vilazodone HCl 20 MG TABS    hydrOXYzine (ATARAX) 25 MG tablet      Receiving Psychotherapy: No    RECOMMENDATIONS:   Greater than 50% of  30 min face to face time with patient was spent on counseling and coordination of care. No changes this visit. Discussed significant improvement with anxiety and depression. So far she is still happy with Viibryd and says that she has improved about 80 %. She is requesting no medication changes this visit. She is now walking and meditating regularly and finding this very beneficial.    We agreed to:   To continue Viibryd 20 mg daily, must take with food.  Continue hydroxyzine 25 mg three times daily as needed. Continue Klonopin 0.5 mg daily only for severe anxiety Stopped trazodone 50 mg at bedtime daily Now on CPAP at night. Educated on good sleep hygiene  Will report worsening symptoms promptly To follow-up in 6 months to reassess Provided emergency contact information   Reviewed PDMP      Joan Flores, NP

## 2023-10-26 ENCOUNTER — Telehealth: Payer: Self-pay | Admitting: Behavioral Health

## 2023-10-26 ENCOUNTER — Encounter: Payer: Self-pay | Admitting: Behavioral Health

## 2023-10-26 ENCOUNTER — Ambulatory Visit: Admitting: Behavioral Health

## 2023-10-26 DIAGNOSIS — F5105 Insomnia due to other mental disorder: Secondary | ICD-10-CM

## 2023-10-26 DIAGNOSIS — F411 Generalized anxiety disorder: Secondary | ICD-10-CM | POA: Diagnosis not present

## 2023-10-26 DIAGNOSIS — Z0289 Encounter for other administrative examinations: Secondary | ICD-10-CM

## 2023-10-26 DIAGNOSIS — F99 Mental disorder, not otherwise specified: Secondary | ICD-10-CM

## 2023-10-26 DIAGNOSIS — F331 Major depressive disorder, recurrent, moderate: Secondary | ICD-10-CM | POA: Diagnosis not present

## 2023-10-26 NOTE — Progress Notes (Signed)
 [facility]  Patient ID: [name],  MRN: [id number]  PCP: Corwin Levins, MD  Date of Evaluation: 10/26/2023 Time spent:30 minutes  Chief Complaint:  Chief Complaint   Depression; Anxiety; Family Problem; Medication Refill; Follow-up; Patient Education; Stress     HISTORY/CURRENT STATUS: HPI "[name]", 58 year old female presents to this office for follow up and medication management. Collateral information should be considered reliable. No psychosocial changes this visit.  Says that since last visit, "I have hit a wall". Says that her depression and anxiety have worsened and having to help care for her son who has mental illness has exacerbated her symptoms. Says that he has recently relapsed and has been hospitalized.  Due to the increased, stress, and depressive symptoms she is requesting a couple days of month to make appointments and provided self care.  She also is utilizing hydroxyzine at bedtime but stopped Trazodone. Numerically rates depression at 2/10, and anxiety 2/10.  Sleep has improved since now on CPAP.  She denies any history of mania, no psychosis, no auditory or visual hallucinations.  Denies SI and HI.  Patient states that she feels safe and verbally contracts for safety with this Clinical research associate.   Past psychiatric medication trials: Zoloft   Individual Medical History/ Review of Systems: Changes? :No   Allergies: Elemental sulfur, Other, and Sulfur  Current Medications:  Current Outpatient Medications:    azithromycin (ZITHROMAX) 250 MG tablet, Sig as indicated, Disp: 6 tablet, Rfl: 0   carvedilol (COREG) 12.5 MG tablet, Take by mouth., Disp: , Rfl:    clonazePAM (KLONOPIN) 0.5 MG tablet, Take one tablet by mouth daily as needed for severe anxiety, Disp: 30 tablet, Rfl: 1   estradiol (VIVELLE-DOT) 0.1 MG/24HR patch, 1 patch 2 (two) times a week., Disp: , Rfl:    famotidine (PEPCID) 20 MG tablet, Take 1 tablet (20 mg total) by mouth 2 (two) times daily.,  Disp: 180 tablet, Rfl: 3   fexofenadine (ALLEGRA) 180 MG tablet, Take 1 tablet by mouth once daily, Disp: 90 tablet, Rfl: 0   hydrochlorothiazide (HYDRODIURIL) 25 MG tablet, Take 1 tablet (25 mg total) by mouth daily. (Patient taking differently: Take 25 mg by mouth 2 (two) times daily.), Disp: 90 tablet, Rfl: 3   HYDROcodone bit-homatropine (HYCODAN) 5-1.5 MG/5ML syrup, Take 5 mLs by mouth at bedtime as needed for cough., Disp: 120 mL, Rfl: 0   hydrOXYzine (ATARAX) 25 MG tablet, Take 1 tablet (25 mg total) by mouth 3 (three) times daily as needed., Disp: 90 tablet, Rfl: 3   linaclotide (LINZESS) 145 MCG CAPS capsule, Take 1 capsule by mouth once daily, Disp: 90 capsule, Rfl: 2   olmesartan (BENICAR) 40 MG tablet, Take 1 tablet (40 mg total) by mouth daily. Must keep June appt for future refills, Disp: 30 tablet, Rfl: 1   pantoprazole (PROTONIX) 40 MG tablet, Take 1 tablet (40 mg total) by mouth 2 (two) times daily., Disp: 180 tablet, Rfl: 3   potassium chloride (KLOR-CON) 10 MEQ tablet, Take 2 tablets by mouth once daily, Disp: 180 tablet, Rfl: 3   rosuvastatin (CRESTOR) 10 MG tablet, Take 1 tablet (10 mg total) by mouth daily., Disp: 90 tablet, Rfl: 1   traZODone (DESYREL) 50 MG tablet, Take 1 tablet (50 mg total) by mouth at bedtime., Disp: 30 tablet, Rfl: 3   Vilazodone HCl 20 MG TABS, Take 1 tablet (20 mg total) by mouth daily after breakfast., Disp: 90 tablet, Rfl: 0 Medication Side Effects: none  Family Medical/  Social History: Changes? No  MENTAL HEALTH EXAM:  Last menstrual period 10/01/2016.There is no height or weight on file to calculate BMI.  General Appearance: Casual, Neat, and Well Groomed  Eye Contact:  Good  Speech:  Clear and Coherent  Volume:  Normal  Mood:  NA  Affect:  Appropriate  Thought Process:  Coherent  Orientation:  Full (Time, Place, and Person)  Thought Content: Logical   Suicidal Thoughts:  No  Homicidal Thoughts:  No  Memory:  WNL  Judgement:  Good   Insight:  Good  Psychomotor Activity:  Normal  Concentration:  Concentration: Good  Recall:  Good  Fund of Knowledge: Good  Language: Good  Assets:  Desire for Improvement  ADL's:  Intact  Cognition: WNL  Prognosis:  Good    DIAGNOSES:    ICD-10-CM   1. Major depressive disorder, recurrent episode, moderate (HCC)  F33.1     2. Generalized anxiety disorder  F41.1     3. Insomnia due to other mental disorder  F51.05    F99       Receiving Psychotherapy: No    RECOMMENDATIONS:  Greater than 50% of  30 min face to face time with patient was spent on counseling and coordination of care. We discussed her recent decline with increased stress, depression and anxiety. She is also at risk for care giver role strain due to her son having mental illness. She is assisting in providing him care. She has not been able to maintain adequate daily functioning at  work or in her daily routine.  She is requesting two days per month of FMLA that she can have flexible time for appointments, self care, and assisting in care of seriously ill son.  Effective day of request is today 10/26/2023 through 05/25/2024.    Documentation left with staff at front desk.  It is my recommendation that this request be granted due to her long hx of anxiety and depression exacerbating periodically which significantly effects her abilities at work.  She will remain under my care with regular follow ups  to continue to improve stability.    We agreed to:   To continue Viibryd 20 mg daily, must take with food.  Continue hydroxyzine 25 mg three times daily as needed. Continue Klonopin 0.5 mg daily only for severe anxiety Stopped trazodone 50 mg at bedtime daily Now on CPAP at night. Educated on good sleep hygiene  Will report worsening symptoms promptly To follow-up in 2 months to reassess Provided emergency contact information   Reviewed PDMP      Joan Flores, NP

## 2023-10-26 NOTE — Telephone Encounter (Signed)
 Pt saw brian today. She brought in her FMLA paperwork  and paid $45 for it to be filled out

## 2023-11-03 NOTE — Telephone Encounter (Signed)
 Paper work completed and signed. Given to admin staff to notify pt its ready.

## 2023-11-15 ENCOUNTER — Emergency Department (HOSPITAL_BASED_OUTPATIENT_CLINIC_OR_DEPARTMENT_OTHER)
Admission: EM | Admit: 2023-11-15 | Discharge: 2023-11-16 | Disposition: A | Discharge status: Home / Self Care | Attending: Emergency Medicine | Admitting: Emergency Medicine

## 2023-11-15 ENCOUNTER — Encounter (HOSPITAL_BASED_OUTPATIENT_CLINIC_OR_DEPARTMENT_OTHER): Payer: Self-pay | Admitting: *Deleted

## 2023-11-15 ENCOUNTER — Other Ambulatory Visit: Payer: Self-pay

## 2023-11-15 ENCOUNTER — Emergency Department (HOSPITAL_BASED_OUTPATIENT_CLINIC_OR_DEPARTMENT_OTHER)

## 2023-11-15 DIAGNOSIS — E876 Hypokalemia: Secondary | ICD-10-CM | POA: Insufficient documentation

## 2023-11-15 DIAGNOSIS — J029 Acute pharyngitis, unspecified: Secondary | ICD-10-CM | POA: Diagnosis present

## 2023-11-15 DIAGNOSIS — J36 Peritonsillar abscess: Secondary | ICD-10-CM | POA: Insufficient documentation

## 2023-11-15 LAB — BASIC METABOLIC PANEL WITH GFR
Anion gap: 14 (ref 5–15)
BUN: 17 mg/dL (ref 6–20)
CO2: 25 mmol/L (ref 22–32)
Calcium: 10.3 mg/dL (ref 8.9–10.3)
Chloride: 99 mmol/L (ref 98–111)
Creatinine, Ser: 1.01 mg/dL — ABNORMAL HIGH (ref 0.44–1.00)
GFR, Estimated: >60 mL/min (ref >60)
Glucose, Bld: 85 mg/dL (ref 70–99)
Potassium: 3.1 mmol/L — ABNORMAL LOW (ref 3.5–5.1)
Sodium: 139 mmol/L (ref 135–145)

## 2023-11-15 LAB — CBC WITH DIFFERENTIAL/PLATELET
Abs Immature Granulocytes: 0.02 10*3/uL (ref 0.00–0.07)
Basophils Absolute: 0 10*3/uL (ref 0.0–0.1)
Basophils Relative: 0 %
Eosinophils Absolute: 0 10*3/uL (ref 0.0–0.5)
Eosinophils Relative: 0 %
HCT: 39.8 % (ref 36.0–46.0)
Hemoglobin: 13.2 g/dL (ref 12.0–15.0)
Immature Granulocytes: 0 %
Lymphocytes Relative: 36 %
Lymphs Abs: 3.3 10*3/uL (ref 0.7–4.0)
MCH: 30.2 pg (ref 26.0–34.0)
MCHC: 33.2 g/dL (ref 30.0–36.0)
MCV: 91.1 fL (ref 80.0–100.0)
Monocytes Absolute: 0.8 10*3/uL (ref 0.1–1.0)
Monocytes Relative: 8 %
Neutro Abs: 5.1 10*3/uL (ref 1.7–7.7)
Neutrophils Relative %: 56 %
Platelets: 213 10*3/uL (ref 150–400)
RBC: 4.37 MIL/uL (ref 3.87–5.11)
RDW: 14.3 % (ref 11.5–15.5)
WBC: 9.2 10*3/uL (ref 4.0–10.5)
nRBC: 0 % (ref 0.0–0.2)

## 2023-11-15 LAB — GROUP A STREP BY PCR: Group A Strep by PCR: NOT DETECTED

## 2023-11-15 MED ORDER — DEXAMETHASONE SODIUM PHOSPHATE 10 MG/ML IJ SOLN
10.0000 mg | Freq: Once | INTRAMUSCULAR | Status: AC
  Administered 2023-11-15: 10 mg via INTRAVENOUS
  Filled 2023-11-15: qty 1

## 2023-11-15 MED ORDER — AMOXICILLIN-POT CLAVULANATE 875-125 MG PO TABS: 1.0000 | ORAL_TABLET | Freq: Two times a day (BID) | ORAL | 0 refills | Status: DC

## 2023-11-15 MED ORDER — SODIUM CHLORIDE 0.9 % IV SOLN
3.0000 g | Freq: Once | INTRAVENOUS | Status: AC
  Administered 2023-11-15: 3 g via INTRAVENOUS

## 2023-11-15 MED ORDER — CLINDAMYCIN PHOSPHATE 600 MG/50ML IV SOLN: 600.0000 mg | Freq: Once | INTRAVENOUS | Status: DC

## 2023-11-15 MED ORDER — DEXAMETHASONE SODIUM PHOSPHATE 10 MG/ML IJ SOLN: 10.0000 mg | Freq: Once | INTRAMUSCULAR | Status: DC

## 2023-11-15 MED ORDER — IOHEXOL 300 MG/ML  SOLN
100.0000 mL | Freq: Once | INTRAMUSCULAR | Status: AC | PRN
  Administered 2023-11-15: 100 mL via INTRAVENOUS

## 2023-11-15 NOTE — ED Triage Notes (Signed)
 Sore throat which began about 2 weeks ago.  Pt states that she thought it was coming from her cpap machine and cough drops would temporarily relieve the sore throat.  Today she coughed up a blood clot and it made her feel concerned.

## 2023-11-15 NOTE — ED Provider Notes (Signed)
 [facility] Provider Note   CSN: [id number] Arrival date & time: 11/15/23  2111     History  Chief Complaint  Patient presents with   Sore Throat   Hemoptysis    [name] is a 58 y.o. female.  Patient is a 58 year old female who presents with sore throat.  She states that she has had discomfort to the left side of her throat for about 2 weeks.  She assumed it was a viral type infection.  She has had a little bit of nasal congestion and has not felt great.  She denies any known fevers.  She said this afternoon, she felt like there was something she had to cough out of the back of her throat and coughed up a blood clot.  She said it then was bleeding for a little while and has stopped.  She has had a little bit of trouble swallowing over the last couple weeks but not too much.  No shortness of breath.  She is not on anticoagulants.       Home Medications Prior to Admission medications   Medication Sig Start Date End Date Taking? Authorizing Provider  amoxicillin -clavulanate (AUGMENTIN ) 875-125 MG tablet Take 1 tablet by mouth every 12 (twelve) hours. 11/15/23  Yes Hershel Los, MD  azithromycin  (ZITHROMAX ) 250 MG tablet Sig as indicated 05/18/23   Elvira Hammersmith, MD  carvedilol (COREG) 12.5 MG tablet Take by mouth. 11/16/22 11/16/23  [provider]  clonazePAM  (KLONOPIN ) 0.5 MG tablet Take one tablet by mouth daily as needed for severe anxiety 10/07/23   Marita Sidle A, NP  estradiol  (VIVELLE -DOT) 0.1 MG/24HR patch 1 patch 2 (two) times a week. 11/02/22   [provider]  famotidine  (PEPCID ) 20 MG tablet Take 1 tablet (20 mg total) by mouth 2 (two) times daily. 07/07/20   Roslyn Coombe, MD  fexofenadine  (ALLEGRA ) 180 MG tablet Take 1 tablet by mouth once daily 12/31/20   Roslyn Coombe, MD  hydrochlorothiazide  (HYDRODIURIL ) 25 MG tablet Take 1 tablet (25 mg total) by mouth daily. Patient taking differently:  Take 25 mg by mouth 2 (two) times daily. 04/12/22   Roslyn Coombe, MD  HYDROcodone  bit-homatropine [facility]) 5-1.5 MG/5ML syrup Take 5 mLs by mouth at bedtime as needed for cough. 05/18/23   Elvira Hammersmith, MD  hydrOXYzine  (ATARAX ) 25 MG tablet Take 1 tablet (25 mg total) by mouth 3 (three) times daily as needed. 10/07/23   Lincoln Renshaw, NP  linaclotide  (LINZESS ) 145 MCG CAPS capsule Take 1 capsule by mouth once daily 12/02/21   Roslyn Coombe, MD  olmesartan  (BENICAR ) 40 MG tablet Take 1 tablet (40 mg total) by mouth daily. Must keep June appt for future refills 11/18/22   Roslyn Coombe, MD  pantoprazole  (PROTONIX ) 40 MG tablet Take 1 tablet (40 mg total) by mouth 2 (two) times daily. 04/10/21   Roslyn Coombe, MD  potassium chloride  (KLOR-CON ) 10 MEQ tablet Take 2 tablets by mouth once daily 06/29/23   Roslyn Coombe, MD  rosuvastatin  (CRESTOR ) 10 MG tablet Take 1 tablet (10 mg total) by mouth daily. 11/17/22   Colene Dauphin, MD  traZODone  (DESYREL ) 50 MG tablet Take 1 tablet (50 mg total) by mouth at bedtime. 03/31/23   Lincoln Renshaw, NP  Vilazodone  HCl 20 MG TABS Take 1 tablet (20 mg total) by mouth daily after breakfast. 10/07/23   Lincoln Renshaw, NP  fluticasone  (  FLONASE ) 50 MCG/ACT nasal spray 2 sprays by Nasal route daily. 10/12/10 09/27/11  Roslyn Coombe, MD      Allergies    Elemental sulfur, Other, and Sulfur    Review of Systems   Review of Systems  Constitutional:  Negative for chills, diaphoresis, fatigue and fever.  HENT:  Positive for congestion, rhinorrhea and sore throat. Negative for sneezing, trouble swallowing and voice change.   Eyes: Negative.   Respiratory:  Negative for cough, chest tightness and shortness of breath.   Cardiovascular:  Negative for chest pain and leg swelling.  Gastrointestinal:  Negative for abdominal pain, diarrhea, nausea and vomiting.  Genitourinary: Negative.   Musculoskeletal:  Negative for arthralgias.  Skin:  Negative for rash.  Neurological:   Negative for dizziness, speech difficulty and headaches.    Physical Exam Updated Vital Signs BP (!) 187/101   Pulse 81   Temp 98.1 F (36.7 C) (Oral)   Resp 16   LMP 10/01/2016   SpO2 99%  Physical Exam Constitutional:      Appearance: She is well-developed.  HENT:     Head: Normocephalic and atraumatic.     Mouth/Throat:     Comments: Patient has some swelling to the left peritonsillar area.  It is erythematous and there is a blood clot noted to the area. Eyes:     Pupils: Pupils are equal, round, and reactive to light.  Cardiovascular:     Rate and Rhythm: Normal rate and regular rhythm.     Heart sounds: Normal heart sounds.  Pulmonary:     Effort: Pulmonary effort is normal. No respiratory distress.     Breath sounds: Normal breath sounds. No wheezing or rales.  Chest:     Chest wall: No tenderness.  Abdominal:     General: Bowel sounds are normal.     Palpations: Abdomen is soft.     Tenderness: There is no abdominal tenderness. There is no guarding or rebound.  Musculoskeletal:        General: Normal range of motion.     Cervical back: Normal range of motion and neck supple.  Lymphadenopathy:     Cervical: No cervical adenopathy.  Skin:    General: Skin is warm and dry.     Findings: No rash.  Neurological:     Mental Status: She is alert and oriented to person, place, and time.     ED Results / Procedures / Treatments   Labs (all labs ordered are listed, but only abnormal results are displayed) Labs Reviewed  BASIC METABOLIC PANEL WITH GFR - Abnormal; Notable for the following components:      Result Value   Potassium 3.1 (*)    Creatinine, Ser 1.01 (*)    All other components within normal limits  GROUP A STREP BY PCR  CBC WITH DIFFERENTIAL/PLATELET    EKG None  Radiology CT Soft Tissue Neck W Contrast Result Date: 11/15/2023 CLINICAL DATA:  Initial evaluation for acute epiglottitis or tonsillitis. EXAM: CT NECK WITH CONTRAST TECHNIQUE:  Multidetector CT imaging of the neck was performed using the standard protocol following the bolus administration of intravenous contrast. RADIATION DOSE REDUCTION: This exam was performed according to the departmental dose-optimization program which includes automated exposure control, adjustment of the mA and/or kV according to patient size and/or use of iterative reconstruction technique. CONTRAST:  OMNIPAQUE  IOHEXOL  300 MG/ML  SOLN COMPARISON:  None Available. FINDINGS: Pharynx and larynx: Oral cavity within normal limits. Palatine tonsils are prominent and  hypertrophied bilaterally, suggesting acute tonsillitis, slightly greater on the left. Suspected superimposed left tonsillar/peritonsillar abscess measuring approximately 1.4 x 0.8 x 0.9 cm (series 4, image 41). Suspected additional tiny 4 mm microabscess within the contralateral right tonsil (series 4, image 41). Adenoidal soft tissues are markedly prominent as well. No retropharyngeal collection or swelling. Epiglottis itself within normal limits. Hypopharynx and supraglottic larynx within normal limits. Negative glottis. Subglottic airway clear. Salivary glands: Right parotid gland and submandibular glands are within normal limits. Postoperative changes with atrophy noted about the left parotid gland. Thyroid : Normal. Lymph nodes: No enlarged or pathologic lymph nodes within the neck. Vascular: Normal intravascular enhancement seen throughout the neck. Limited intracranial: Unremarkable. Visualized orbits: Unremarkable. Mastoids and visualized paranasal sinuses: Clear/normal. Skeleton: No worrisome osseous lesions. Mild-to-moderate spondylosis at C3-4 through C6-7. Upper chest: No other acute finding. Other: None. IMPRESSION: 1. Findings suggestive of acute tonsillitis, slightly greater on the left. Superimposed left tonsillar/peritonsillar abscess measuring 1.4 x 0.8 x 0.9 cm. Probable additional tiny 4 mm microabscess within the contralateral right  tonsil. 2. No other acute abnormality within the neck. Electronically Signed   By: Virgia Griffins M.D.   On: 11/15/2023 23:05    Procedures Procedures    Medications Ordered in ED Medications  Ampicillin-Sulbactam (UNASYN) 3 g in sodium chloride  0.9 % 100 mL IVPB (has no administration in time range)  dexamethasone  (DECADRON ) injection 10 mg (has no administration in time range)  iohexol  (OMNIPAQUE ) 300 MG/ML solution 100 mL (100 mLs Intravenous Contrast Given 11/15/23 2241)    ED Course/ Medical Decision Making/ A&P                                 Medical Decision Making Amount and/or Complexity of Data Reviewed Labs: ordered. Radiology: ordered.  Risk Prescription drug management.   Patient is a 58 year old who presents with swelling of the left side of her throat.  She had some bleeding from it earlier today and has a little clot on there on exam.  Labs are unremarkable other than some mild hypokalemia.  CT scan shows evidence of a left peritonsillar abscess and may be a small microabscess on the right.  She does not have any airway compromise.  She is handling her secretions well.  Not much difficulty swallowing.  On reexam, she said she had some stones and material that she coughed up and actually was feeling better.  Repeat exam shows the swelling actually has gone down in the left peritonsillar area and there is no clot or any bleeding in the area currently.  Suspect that the abscess has been draining on its own.  I did discuss with Dr. Virgia Griffins with ENT who feels it is reasonable to start the patient on antibiotics and she can follow-up with the ENT office as needed.  She was given dose of IV Unasyn and Decadron  in the ED.  Will discharge her with prescription for Augmentin .  She was discharged home in good condition.  Return precautions were given.  Will recheck her blood pressure prior to discharge.  If still elevated, will recommend follow-up with her PCP.  Final Clinical  Impression(s) / ED Diagnoses Final diagnoses:  Peritonsillar abscess    Rx / DC Orders ED Discharge Orders          Ordered    amoxicillin -clavulanate (AUGMENTIN ) 875-125 MG tablet  Every 12 hours        11/15/23 2319  Hershel Los, MD 11/15/23 [phone]

## 2023-11-15 NOTE — ED Notes (Signed)
 When I swabbed her throat the swab was bloody

## 2023-11-18 ENCOUNTER — Encounter: Payer: Self-pay | Admitting: Internal Medicine

## 2023-11-18 ENCOUNTER — Other Ambulatory Visit: Payer: Self-pay | Admitting: Family Medicine

## 2023-11-18 DIAGNOSIS — Z1231 Encounter for screening mammogram for malignant neoplasm of breast: Secondary | ICD-10-CM

## 2023-11-25 NOTE — H&P (Signed)
 HPI:   [name] is a 58 y.o. female who presents as a consult Patient.   Referring Provider: No ref. provider found  Chief complaint: Tonsil swelling.  HPI: She has complaint of swelling of her tonsils. She describes sore throat, postnasal drainage, throat clearing and hoarseness. She has a history of reflux. She was drinking a few servings of caffeine daily but she quit a few months ago. She still chews peppermint gum on a daily basis. She takes Protonix  on a regular basis. She has been diagnosed and treated for sinus infections multiple times every year including a few weeks ago. She has discolored nasal discharge and congestion. She has not had imaging.  PMH/Meds/All/SocHx/FamHx/ROS:   Past Medical History:  Diagnosis Date  Acute bilateral low back pain with bilateral sciatica 06/24/2020  Anxiety state 03/26/2007  Formatting of this note might be different from the original. Qualifier: Diagnosis of By: Autry Legions MD, Alveda Aures Last Assessment & Plan: Formatting of this note might be different from the original. Stable, cont zoloft  and klonopin  prn  Asthma 10/10/2018  Last Assessment & Plan: Formatting of this note might be different from the original. stable overall by history and exam, recent data reviewed with pt, and pt to continue medical treatment as before, to f/u any worsening symptoms or concerns  Closed head injury 04/18/2019  Last Assessment & Plan: Formatting of this note might be different from the original. Ct head neg, symptoms improved, to f/u any worsening symptoms or concerns  Depression 03/26/2007  Formatting of this note might be different from the original. Qualifier: Diagnosis of By: Autry Legions MD, Alveda Aures Last Assessment & Plan: Formatting of this note might be different from the original. Mild worsening, for increased zoloft  150 qd  Dysphagia 11/13/2018  Last Assessment & Plan: Formatting of this note might be different from the original. May need EGD, refer GI, to f/u any worsening  symptoms or concerns  Dyspnea on exertion 05/25/2022  Esophageal reflux 03/26/2007  Formatting of this note might be different from the original. Qualifier: Diagnosis of By: Autry Legions MD, Alveda Aures Last Assessment & Plan: Formatting of this note might be different from the original. stable overall by history and exam, recent data reviewed with pt, and pt to continue medical treatment as before, to f/u any worsening symptoms or concerns  Hyperlipidemia 03/26/2007  Formatting of this note might be different from the original. Qualifier: Diagnosis of By: Autry Legions MD, Alveda Aures Last Assessment & Plan: Formatting of this note is different from the original. Lab Results Component Value Date LDLCALC 88 08/27/2021 Stable, pt to continue current statin crestor  10 mg qd  Hypertension  Irritable bowel syndrome 03/26/2007  Formatting of this note might be different from the original. Qualifier: Diagnosis of By: Autry Legions MD, Alveda Aures  Obesity (BMI 30.0-34.9) 04/12/2022  Last Assessment & Plan: Formatting of this note might be different from the original. Pt plans to start walking, for bariatric surgury referral, diet training  Venous insufficiency 10/12/2020  Last Assessment & Plan: Formatting of this note might be different from the original. Hopefully to improve as well with increased hct, also for low salt, leg elevation, exercise, wt loss  Vertigo 04/20/2019  Last Assessment & Plan: Formatting of this note might be different from the original. 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     Current Outpatient Medications:   acetaminophen  (TYLENOL ) 500 mg tablet, Take 1,000 mg by mouth., Disp: , Rfl:  carvediloL (COREG) 12.5 mg tablet, Take 1 tablet (12.5 mg total) by mouth in the morning and 1 tablet (12.5 mg total) in the evening. Take with meals., Disp: 180 tablet, Rfl: 3 clonazePAM  (KlonoPIN ) 0.5 mg tablet, Take 0.5 mg by mouth as needed., Disp: , Rfl:  ergocalciferol (VITAMIN D2) 1,250 mcg (50,000 unit) capsule, Take 50,000 Units by mouth once a week., Disp: , Rfl:  estradioL  (VIVELLE -DOT) 0.1 mg/24 hr, APPLY 1 PATCH TOPICALLY TWICE WEEKLY, Disp: , Rfl:  famotidine  (PEPCID ) 20 mg tablet, Take 20 mg by mouth Once Daily., Disp: , Rfl:  hydroCHLOROthiazide  (HYDRODIURIL ) 25 mg tablet, Take 1 tablet (25 mg total) by mouth daily., Disp: 90 tablet, Rfl: 3 Linzess  145 mcg cap capsule, as needed., Disp: , Rfl:  olmesartan  (BENICAR ) 40 mg tablet, Take 1 tablet (40 mg total) by mouth daily., Disp: 90 tablet, Rfl: 1 pantoprazole  (PROTONIX ) 40 mg EC tablet, Take by mouth., Disp: , Rfl:  rosuvastatin  (CRESTOR ) 10 mg tablet, Take 10 mg by mouth Once Daily., Disp: , Rfl:  albuterol  HFA (Ventolin  HFA) 90 mcg/actuation inhaler, Inhale as needed. (Patient not taking: Reported on 01/27/2023), Disp: , Rfl:  fluticasone  propion-salmeteroL (ADVAIR DISKUS) 250-50 mcg/dose diskus inhaler, Inhale 1 puff 2 (two) times a day., Disp: , Rfl:  potassium chloride  (KLOR-CON ) 10 mEq ER tablet, Take 40 mEq by mouth Once Daily for 90 days., Disp: 120 tablet, Rfl: 0 topiramate (TOPAMAX) 50 mg tablet, Take 50 mg by mouth 2 (two) times a day. (Patient not taking: Reported on 01/27/2023), Disp: , Rfl:   A complete ROS was performed with pertinent positives/negatives noted in the HPI. The remainder of the ROS are negative.   Physical Exam:   Temp 97.6 F (36.4 C)  Resp 18  Ht 1.575 m (5\' 2" )  Wt 85.9 kg (189 lb 6.4 oz)  BMI 34.64 kg/m   General: Healthy and alert, in no distress, breathing easily. Normal affect. In a pleasant mood. Head:  Normocephalic, atraumatic. No masses, or scars. Eyes: Pupils are equal, and reactive to light. Vision is grossly intact. No spontaneous or gaze nystagmus. Ears: Ear canals are clear. Tympanic membranes are intact, with normal landmarks and the middle ears are clear and healthy. Hearing: Grossly normal. Nose: Nasal cavities are clear with healthy mucosa, no polyps or exudate. Airways are patent. Face: No masses or scars, facial nerve function is symmetric. Oral Cavity: No mucosal abnormalities are noted. Tongue with normal mobility. Dentition appears healthy. Oropharynx: Tonsils are symmetric, 2+ in size without any inflammatory changes.. There are no mucosal masses identified. Tongue base appears normal and healthy. Larynx/Hypopharynx: indirect exam reveals healthy, mobile vocal cords, without mucosal lesions in the hypopharynx or larynx. Chest: Deferred Neck: No palpable masses, no cervical adenopathy, no thyroid  nodules or enlargement. Neuro: Cranial nerves II-XII with normal function. Balance: Normal gate. Other findings: none.  Independent Review of Additional Tests or Records:  none  Procedures:  none  Impression & Plans:  Chronic reflux. A lot of her symptoms are reflux related. Recommend avoid all peppermint.We discussed causes of reflux, including lifestyle and dietary factors. Recommend strict avoidance of all tobacco, caffeine, alcohol, chocolate and peppermint. A reflux handout with more detailed instructions was  provided to the patient.   Given her history of being treated for sinusitis on multiple occasions recommend CT sinus imaging to rule out any chronic disease. Will discuss results when available.    Recent onset of acute tonsillitis symptoms. Seen in the emergency department 2 days ago. Started on oral antibiotics. Strep test was negative. She is feeling much better now. She has had chronic ongoing problems with her tonsils for years. She has chronic tonsil stones and  intermittent tonsillitis. She is interested in tonsillectomy.  On exam her breathing and voice are clear today. She is in no distress. The tonsils are symmetric and without any inflammatory changes. They are moderately sized. Pharynx otherwise clear. Ears are healthy and clear. No adenopathy.  Complete the antibiotics for this recent infection. Consider tonsillectomy for long-term symptomatic relief. Estine meets the indications for tonsillectomy. Risks and benefits were discussed in detail. All questions were answered. A handout was provided with additional details.

## 2023-11-28 ENCOUNTER — Encounter (HOSPITAL_COMMUNITY): Payer: Self-pay | Admitting: Otolaryngology

## 2023-11-28 ENCOUNTER — Other Ambulatory Visit: Payer: Self-pay

## 2023-11-28 NOTE — Progress Notes (Addendum)
 SDW CALL  Patient was given pre-op instructions over the phone. The opportunity was given for the patient to ask questions. No further questions asked. Patient verbalized understanding of instructions given.   PCP - Katherina Pan Cardiologist - Marylouise Socks  PPM/ICD - denies   Chest x-ray - 05/18/23 EKG - DOS Stress Test -  ECHO -  Cardiac Cath -   Sleep Study - OSA+ and wears CPAP at night   No DM  Last dose of GLP1 agonist-  n/a GLP1 instructions:  n/a  Blood Thinner Instructions: n/a Aspirin Instructions: n/a  ERAS Protcol - NPO  COVID TEST- n/a   Anesthesia review: yes - due to high BP in ED recently  Augmentin  was completed on 11/27/23.   Patient denies shortness of breath, fever, cough and chest pain over the phone call   All instructions explained to the patient, with a verbal understanding of the material. Patient agrees to go over the instructions while at home for a better understanding.    Text message with instructions sent to patient at patient's request.

## 2023-11-30 ENCOUNTER — Other Ambulatory Visit: Payer: Self-pay

## 2023-11-30 ENCOUNTER — Ambulatory Visit (HOSPITAL_BASED_OUTPATIENT_CLINIC_OR_DEPARTMENT_OTHER): Payer: Self-pay | Admitting: Physician Assistant

## 2023-11-30 ENCOUNTER — Encounter (HOSPITAL_COMMUNITY): Payer: Self-pay | Admitting: Otolaryngology

## 2023-11-30 ENCOUNTER — Observation Stay (HOSPITAL_COMMUNITY)
Admission: RE | Admit: 2023-11-30 | Discharge: 2023-12-01 | Disposition: A | Discharge status: Home / Self Care | Attending: Otolaryngology | Admitting: Otolaryngology

## 2023-11-30 ENCOUNTER — Ambulatory Visit (HOSPITAL_COMMUNITY): Payer: Self-pay | Admitting: Physician Assistant

## 2023-11-30 ENCOUNTER — Encounter (HOSPITAL_COMMUNITY)
Admission: RE | Disposition: A | Discharge status: Home / Self Care | Payer: Self-pay | Source: Home / Self Care | Attending: Otolaryngology

## 2023-11-30 ENCOUNTER — Other Ambulatory Visit (HOSPITAL_COMMUNITY): Payer: Self-pay

## 2023-11-30 DIAGNOSIS — I10 Essential (primary) hypertension: Secondary | ICD-10-CM | POA: Diagnosis not present

## 2023-11-30 DIAGNOSIS — J351 Hypertrophy of tonsils: Principal | ICD-10-CM | POA: Insufficient documentation

## 2023-11-30 DIAGNOSIS — J3501 Chronic tonsillitis: Secondary | ICD-10-CM | POA: Insufficient documentation

## 2023-11-30 DIAGNOSIS — G4733 Obstructive sleep apnea (adult) (pediatric): Secondary | ICD-10-CM | POA: Diagnosis not present

## 2023-11-30 DIAGNOSIS — Z79899 Other long term (current) drug therapy: Secondary | ICD-10-CM | POA: Insufficient documentation

## 2023-11-30 DIAGNOSIS — Z9089 Acquired absence of other organs: Principal | ICD-10-CM

## 2023-11-30 DIAGNOSIS — J45909 Unspecified asthma, uncomplicated: Secondary | ICD-10-CM | POA: Insufficient documentation

## 2023-11-30 HISTORY — DX: Prediabetes: R73.03

## 2023-11-30 SURGERY — TONSILLECTOMY
Anesthesia: General | Laterality: Bilateral

## 2023-11-30 MED ORDER — FENTANYL CITRATE (PF) 100 MCG/2ML IJ SOLN: 25.0000 ug | INTRAMUSCULAR | Status: DC | PRN

## 2023-11-30 MED ORDER — ONDANSETRON HCL 4 MG/2ML IJ SOLN: 4.0000 mg | INTRAMUSCULAR | Status: DC | PRN

## 2023-11-30 MED ORDER — VITAMIN D (ERGOCALCIFEROL) 1.25 MG (50000 UNIT) PO CAPS: 50000.0000 [IU] | ORAL_CAPSULE | ORAL | Status: DC

## 2023-11-30 MED ORDER — FAMOTIDINE 20 MG PO TABS: 20.0000 mg | ORAL_TABLET | Freq: Two times a day (BID) | ORAL | Status: DC | PRN

## 2023-11-30 MED ORDER — LINACLOTIDE 145 MCG PO CAPS: 145.0000 ug | ORAL_CAPSULE | Freq: Every day | ORAL | Status: DC | PRN

## 2023-11-30 MED ORDER — HYDROCODONE-ACETAMINOPHEN 7.5-325 MG/15ML PO SOLN
15.0000 mL | Freq: Four times a day (QID) | ORAL | 0 refills | Status: DC | PRN
  Filled 2023-11-30: qty 419, 7d supply, fill #0

## 2023-11-30 MED ORDER — ONDANSETRON 4 MG PO TBDP
4.0000 mg | ORAL_TABLET | Freq: Three times a day (TID) | ORAL | 0 refills | Status: AC | PRN
Start: 2023-11-30 — End: ?
  Filled 2023-11-30: qty 20, 7d supply, fill #0

## 2023-11-30 MED ORDER — IBUPROFEN 100 MG/5ML PO SUSP: 400.0000 mg | Freq: Four times a day (QID) | ORAL | Status: DC | PRN

## 2023-11-30 MED ORDER — CLONAZEPAM 0.5 MG PO TABS: 0.5000 mg | ORAL_TABLET | Freq: Every day | ORAL | Status: DC | PRN

## 2023-11-30 MED ORDER — VILAZODONE HCL 20 MG PO TABS
20.0000 mg | ORAL_TABLET | Freq: Every day | ORAL | Status: DC
  Administered 2023-12-01: 20 mg via ORAL
  Filled 2023-11-30: qty 1

## 2023-11-30 MED ORDER — CHLORHEXIDINE GLUCONATE 0.12 % MT SOLN
15.0000 mL | Freq: Once | OROMUCOSAL | Status: AC
  Administered 2023-11-30: 15 mL via OROMUCOSAL
  Filled 2023-11-30: qty 15

## 2023-11-30 MED ORDER — AMLODIPINE BESYLATE 5 MG PO TABS
5.0000 mg | ORAL_TABLET | Freq: Every day | ORAL | Status: DC
  Administered 2023-11-30 – 2023-12-01 (×2): 5 mg via ORAL
  Filled 2023-11-30 (×2): qty 1

## 2023-11-30 MED ORDER — PROPOFOL 10 MG/ML IV BOLUS
INTRAVENOUS | Status: AC
  Filled 2023-11-30: qty 20

## 2023-11-30 MED ORDER — CARVEDILOL 12.5 MG PO TABS
12.5000 mg | ORAL_TABLET | Freq: Two times a day (BID) | ORAL | Status: DC
  Administered 2023-11-30 – 2023-12-01 (×2): 12.5 mg via ORAL
  Filled 2023-11-30 (×2): qty 1

## 2023-11-30 MED ORDER — DEXMEDETOMIDINE HCL IN NACL 80 MCG/20ML IV SOLN
INTRAVENOUS | Status: DC | PRN
  Administered 2023-11-30: 8 ug via INTRAVENOUS

## 2023-11-30 MED ORDER — FLUTICASONE PROPIONATE 50 MCG/ACT NA SUSP: 1.0000 | Freq: Every day | NASAL | Status: DC | PRN

## 2023-11-30 MED ORDER — FENTANYL CITRATE (PF) 250 MCG/5ML IJ SOLN
INTRAMUSCULAR | Status: AC
  Filled 2023-11-30: qty 5

## 2023-11-30 MED ORDER — DEXAMETHASONE SODIUM PHOSPHATE 10 MG/ML IJ SOLN
INTRAMUSCULAR | Status: AC
  Filled 2023-11-30: qty 1

## 2023-11-30 MED ORDER — ACETAMINOPHEN 10 MG/ML IV SOLN
1000.0000 mg | Freq: Once | INTRAVENOUS | Status: DC | PRN
  Administered 2023-11-30: 1000 mg via INTRAVENOUS

## 2023-11-30 MED ORDER — ONDANSETRON HCL 4 MG PO TABS: 4.0000 mg | ORAL_TABLET | ORAL | Status: DC | PRN

## 2023-11-30 MED ORDER — HYDROCODONE-ACETAMINOPHEN 7.5-325 MG/15ML PO SOLN
10.0000 mL | ORAL | Status: DC | PRN
  Administered 2023-11-30 – 2023-12-01 (×4): 15 mL via ORAL
  Filled 2023-11-30 (×4): qty 15

## 2023-11-30 MED ORDER — 0.9 % SODIUM CHLORIDE (POUR BTL) OPTIME
TOPICAL | Status: DC | PRN
  Administered 2023-11-30: 1000 mL

## 2023-11-30 MED ORDER — MIDAZOLAM HCL 2 MG/2ML IJ SOLN
INTRAMUSCULAR | Status: AC
  Filled 2023-11-30: qty 2

## 2023-11-30 MED ORDER — DEXAMETHASONE SODIUM PHOSPHATE 10 MG/ML IJ SOLN
INTRAMUSCULAR | Status: DC | PRN
Start: 2023-11-30 — End: 2023-11-30
  Administered 2023-11-30: 10 mg via INTRAVENOUS

## 2023-11-30 MED ORDER — LACTATED RINGERS IV SOLN: INTRAVENOUS | Status: DC

## 2023-11-30 MED ORDER — MIDAZOLAM HCL 2 MG/2ML IJ SOLN
INTRAMUSCULAR | Status: DC | PRN
  Administered 2023-11-30: 2 mg via INTRAVENOUS

## 2023-11-30 MED ORDER — LORATADINE 10 MG PO TABS
10.0000 mg | ORAL_TABLET | Freq: Every day | ORAL | Status: DC
  Administered 2023-11-30 – 2023-12-01 (×2): 10 mg via ORAL
  Filled 2023-11-30 (×2): qty 1

## 2023-11-30 MED ORDER — LIDOCAINE 2% (20 MG/ML) 5 ML SYRINGE
INTRAMUSCULAR | Status: AC
  Filled 2023-11-30: qty 5

## 2023-11-30 MED ORDER — OXYCODONE HCL 5 MG/5ML PO SOLN
ORAL | Status: AC
  Filled 2023-11-30: qty 5

## 2023-11-30 MED ORDER — HYDROCHLOROTHIAZIDE 25 MG PO TABS
25.0000 mg | ORAL_TABLET | Freq: Every day | ORAL | Status: DC
  Administered 2023-11-30 – 2023-12-01 (×2): 25 mg via ORAL
  Filled 2023-11-30 (×2): qty 1

## 2023-11-30 MED ORDER — ONDANSETRON HCL 4 MG/2ML IJ SOLN
INTRAMUSCULAR | Status: DC | PRN
  Administered 2023-11-30: 4 mg via INTRAVENOUS

## 2023-11-30 MED ORDER — ROCURONIUM BROMIDE 10 MG/ML (PF) SYRINGE: PREFILLED_SYRINGE | INTRAVENOUS | Status: DC | PRN

## 2023-11-30 MED ORDER — PROPOFOL 10 MG/ML IV BOLUS
INTRAVENOUS | Status: DC | PRN
  Administered 2023-11-30: 180 mg via INTRAVENOUS

## 2023-11-30 MED ORDER — OLMESARTAN-AMLODIPINE-HCTZ 40-5-25 MG PO TABS: 1.0000 | ORAL_TABLET | Freq: Every day | ORAL | Status: DC

## 2023-11-30 MED ORDER — IRBESARTAN 300 MG PO TABS
300.0000 mg | ORAL_TABLET | Freq: Every day | ORAL | Status: DC
  Administered 2023-11-30 – 2023-12-01 (×2): 300 mg via ORAL
  Filled 2023-11-30 (×2): qty 1

## 2023-11-30 MED ORDER — ROSUVASTATIN CALCIUM 5 MG PO TABS
10.0000 mg | ORAL_TABLET | Freq: Every day | ORAL | Status: DC
  Administered 2023-12-01: 10 mg via ORAL
  Filled 2023-11-30: qty 2

## 2023-11-30 MED ORDER — TRAZODONE HCL 50 MG PO TABS
50.0000 mg | ORAL_TABLET | Freq: Every evening | ORAL | Status: DC | PRN
Start: 2023-11-30 — End: 2023-12-01

## 2023-11-30 MED ORDER — PHENOL 1.4 % MT LIQD
1.0000 | OROMUCOSAL | Status: DC | PRN
  Filled 2023-11-30: qty 177

## 2023-11-30 MED ORDER — POTASSIUM CHLORIDE CRYS ER 20 MEQ PO TBCR
20.0000 meq | EXTENDED_RELEASE_TABLET | Freq: Two times a day (BID) | ORAL | Status: DC
  Administered 2023-11-30 – 2023-12-01 (×2): 20 meq via ORAL
  Filled 2023-11-30 (×2): qty 1

## 2023-11-30 MED ORDER — SUCCINYLCHOLINE CHLORIDE 200 MG/10ML IV SOSY
PREFILLED_SYRINGE | INTRAVENOUS | Status: AC
  Filled 2023-11-30: qty 10

## 2023-11-30 MED ORDER — ORAL CARE MOUTH RINSE: 15.0000 mL | Freq: Once | OROMUCOSAL | Status: AC

## 2023-11-30 MED ORDER — DEXTROSE-SODIUM CHLORIDE 5-0.9 % IV SOLN: INTRAVENOUS | Status: AC

## 2023-11-30 MED ORDER — LIDOCAINE 2% (20 MG/ML) 5 ML SYRINGE
INTRAMUSCULAR | Status: DC | PRN
  Administered 2023-11-30: 60 mg via INTRAVENOUS

## 2023-11-30 MED ORDER — DROPERIDOL 2.5 MG/ML IJ SOLN: 0.6250 mg | Freq: Once | INTRAMUSCULAR | Status: DC | PRN

## 2023-11-30 MED ORDER — SUCCINYLCHOLINE CHLORIDE 200 MG/10ML IV SOSY
PREFILLED_SYRINGE | INTRAVENOUS | Status: DC | PRN
  Administered 2023-11-30: 140 mg via INTRAVENOUS

## 2023-11-30 MED ORDER — FENTANYL CITRATE (PF) 250 MCG/5ML IJ SOLN
INTRAMUSCULAR | Status: DC | PRN
  Administered 2023-11-30: 100 ug via INTRAVENOUS
  Administered 2023-11-30: 50 ug via INTRAVENOUS

## 2023-11-30 MED ORDER — HYDROXYZINE HCL 25 MG PO TABS
25.0000 mg | ORAL_TABLET | Freq: Three times a day (TID) | ORAL | Status: DC | PRN
  Administered 2023-12-01: 25 mg via ORAL
  Filled 2023-11-30: qty 1

## 2023-11-30 MED ORDER — OXYCODONE HCL 5 MG/5ML PO SOLN
5.0000 mg | Freq: Once | ORAL | Status: AC
  Administered 2023-11-30: 5 mg via ORAL

## 2023-11-30 MED ORDER — ONDANSETRON HCL 4 MG/2ML IJ SOLN
INTRAMUSCULAR | Status: AC
  Filled 2023-11-30: qty 2

## 2023-11-30 MED ORDER — OXYBUTYNIN CHLORIDE 5 MG/5ML PO SOLN: 5.0000 mg | Freq: Two times a day (BID) | ORAL | Status: DC

## 2023-11-30 SURGICAL SUPPLY — 28 items
BAG COUNTER SPONGE SURGICOUNT (BAG) ×1 IMPLANT
BLADE SURG 15 STRL LF DISP TIS (BLADE) IMPLANT
CANISTER SUCTION 3000ML PPV (SUCTIONS) ×1 IMPLANT
CATH ROBINSON RED A/P 10FR (CATHETERS) IMPLANT
CLEANER TIP ELECTROSURG 2X2 (MISCELLANEOUS) ×1 IMPLANT
COAGULATOR SUCT SWTCH 10FR 6 (ELECTROSURGICAL) ×1 IMPLANT
DRAPE HALF SHEET 40X57 (DRAPES) IMPLANT
ELECT COATED BLADE 2.86 ST (ELECTRODE) ×1 IMPLANT
ELECTRODE REM PT RETRN 9FT PED (ELECTROSURGICAL) IMPLANT
ELECTRODE REM PT RTRN 9FT ADLT (ELECTROSURGICAL) IMPLANT
GAUZE 4X4 16PLY ~~LOC~~+RFID DBL (SPONGE) ×1 IMPLANT
GLOVE ECLIPSE 7.5 STRL STRAW (GLOVE) ×1 IMPLANT
GOWN STRL REUS W/ TWL LRG LVL3 (GOWN DISPOSABLE) ×2 IMPLANT
KIT BASIN OR (CUSTOM PROCEDURE TRAY) ×1 IMPLANT
KIT TURNOVER KIT B (KITS) ×1 IMPLANT
NDL PRECISIONGLIDE 27X1.5 (NEEDLE) IMPLANT
NEEDLE PRECISIONGLIDE 27X1.5 (NEEDLE) IMPLANT
NS IRRIG 1000ML POUR BTL (IV SOLUTION) ×1 IMPLANT
PACK SRG BSC III STRL LF ECLPS (CUSTOM PROCEDURE TRAY) ×1 IMPLANT
PAD ARMBOARD POSITIONER FOAM (MISCELLANEOUS) ×2 IMPLANT
PENCIL FOOT CONTROL (ELECTRODE) ×1 IMPLANT
SPONGE TONSIL 1.25 RF SGL STRG (GAUZE/BANDAGES/DRESSINGS) IMPLANT
SYR BULB EAR ULCER 3OZ GRN STR (SYRINGE) ×1 IMPLANT
TOWEL GREEN STERILE FF (TOWEL DISPOSABLE) ×1 IMPLANT
TUBE CONNECTING 12X1/4 (SUCTIONS) ×1 IMPLANT
TUBE SALEM SUMP 12F (TUBING) ×1 IMPLANT
TUBE SALEM SUMP 14F (TUBING) ×1 IMPLANT
WATER STERILE IRR 1000ML POUR (IV SOLUTION) ×1 IMPLANT

## 2023-11-30 NOTE — Anesthesia Procedure Notes (Signed)
 Procedure Name: Intubation Date/Time: 11/30/2023 10:59 AM  Performed by: Zola Hint, CRNAPre-anesthesia Checklist: Patient identified, Emergency Drugs available, Suction available and Patient being monitored Patient Re-evaluated:Patient Re-evaluated prior to induction Oxygen Delivery Method: Circle System Utilized Preoxygenation: Pre-oxygenation with 100% oxygen Induction Type: IV induction Ventilation: Mask ventilation without difficulty Laryngoscope Size: Miller and 2 Grade View: Grade II Tube type: Oral Rae Tube size: 7.0 mm Number of attempts: 1 Airway Equipment and Method: Stylet and Oral airway Placement Confirmation: ETT inserted through vocal cords under direct vision, positive ETCO2 and breath sounds checked- equal and bilateral Secured at: 20 cm Tube secured with: Tape Dental Injury: Teeth and Oropharynx as per pre-operative assessment

## 2023-11-30 NOTE — Anesthesia Postprocedure Evaluation (Signed)
 Anesthesia Post Note  Patient: [name]  Procedure(s) Performed: TONSILLECTOMY (Bilateral)     Patient location during evaluation: PACU Anesthesia Type: General Level of consciousness: awake and alert Pain management: pain level controlled Vital Signs Assessment: post-procedure vital signs reviewed and stable Respiratory status: spontaneous breathing, nonlabored ventilation, respiratory function stable and patient connected to nasal cannula oxygen Cardiovascular status: blood pressure returned to baseline and stable Postop Assessment: no apparent nausea or vomiting Anesthetic complications: no   No notable events documented.  Last Vitals:  Vitals:   11/30/23 1330 11/30/23 1400  BP: 116/83 (!) 144/87  Pulse: 67 64  Resp: 16 14  Temp:    SpO2: 94% 97%    Last Pain:  Vitals:   11/30/23 1230  TempSrc:   PainSc: 0-No pain                 Theotis Flake P Jillann Charette

## 2023-11-30 NOTE — Interval H&P Note (Signed)
 History and Physical Interval Note:  11/30/2023 9:51 AM  [name]  has presented today for surgery, with the diagnosis of Laryngopharyngeal reflux  Obstructive sleep apnea.  The various methods of treatment have been discussed with the patient and family. After consideration of risks, benefits and other options for treatment, the patient has consented to  Procedure(s): TONSILLECTOMY (Bilateral) as a surgical intervention.  The patient's history has been reviewed, patient examined, no change in status, stable for surgery.  I have reviewed the patient's chart and labs.  Questions were answered to the patient's satisfaction.     [name]

## 2023-11-30 NOTE — Anesthesia Preprocedure Evaluation (Signed)
 Anesthesia Evaluation  Patient identified by MRN, date of birth, ID band Patient awake    Reviewed: Allergy & Precautions, NPO status , Patient's Chart, lab work & pertinent test results  History of Anesthesia Complications (+) PONV and history of anesthetic complications  Airway Mallampati: II  TM Distance: >3 FB Neck ROM: Full    Dental no notable dental hx.    Pulmonary asthma    Pulmonary exam normal        Cardiovascular hypertension, Pt. on medications and Pt. on home beta blockers + DOE   Rhythm:Regular Rate:Normal     Neuro/Psych   Anxiety Depression    negative neurological ROS     GI/Hepatic Neg liver ROS,GERD  ,,  Endo/Other  negative endocrine ROS    Renal/GU negative Renal ROS  negative genitourinary   Musculoskeletal negative musculoskeletal ROS (+)    Abdominal Normal abdominal exam  (+)   Peds  Hematology  (+) Blood dyscrasia, anemia   Anesthesia Other Findings   Reproductive/Obstetrics                             Anesthesia Physical Anesthesia Plan  ASA: 2  Anesthesia Plan: General   Post-op Pain Management:    Induction: Intravenous  PONV Risk Score and Plan: 4 or greater and Ondansetron , Dexamethasone , Midazolam  and Treatment may vary due to age or medical condition  Airway Management Planned: Mask and Oral ETT  Additional Equipment: None  Intra-op Plan:   Post-operative Plan: Extubation in OR  Informed Consent: I have reviewed the patients History and Physical, chart, labs and discussed the procedure including the risks, benefits and alternatives for the proposed anesthesia with the patient or authorized representative who has indicated his/her understanding and acceptance.     Dental advisory given  Plan Discussed with: CRNA  Anesthesia Plan Comments:        Anesthesia Quick Evaluation

## 2023-11-30 NOTE — Transfer of Care (Signed)
 Immediate Anesthesia Transfer of Care Note  Patient: [name]  Procedure(s) Performed: TONSILLECTOMY (Bilateral)  Patient Location: PACU  Anesthesia Type:General  Level of Consciousness: drowsy and responds to stimulation  Airway & Oxygen Therapy: Patient Spontanous Breathing and Patient connected to face mask oxygen  Post-op Assessment: Report given to RN, Post -op Vital signs reviewed and stable, and Patient moving all extremities X 4  Post vital signs: Reviewed and stable  Last Vitals:  Vitals Value Taken Time  BP 152/92 11/30/23 1140  Temp    Pulse 58 11/30/23 1142  Resp 11 11/30/23 1142  SpO2 98 % 11/30/23 1142  Vitals shown include unfiled device data.  Last Pain:  Vitals:   11/30/23 1140  TempSrc:   PainSc: Asleep      Patients Stated Pain Goal: 0 (11/30/23 0929)  Complications: No notable events documented.

## 2023-11-30 NOTE — Plan of Care (Signed)

## 2023-11-30 NOTE — Op Note (Signed)
 11/30/2023  10:55 AM  PATIENT:  [name]  58 y.o. female  PRE-OPERATIVE DIAGNOSIS:  Tonsil hypertrophy  Obstructive sleep apnea  POST-OPERATIVE DIAGNOSIS:  Tonsil hypertrophy, Obstructive sleep apnea  PROCEDURE:  Procedure(s): TONSILLECTOMY  SURGEON:  Surgeon(s): Janita Mellow, MD  ANESTHESIA:   General  COUNTS: Correct   DICTATION: The patient was taken to the operating room and placed on the operating table in the supine position. Following induction of general endotracheal anesthesia, the table was turned and the patient was draped in a standard fashion. A Crowe-Davis mouthgag was inserted into the oral cavity and used to retract the tongue and mandible, then attached to the Mayo stand.  The tonsillectomy was then performed using electrocautery dissection, carefully dissecting the avascular plane between the capsule and constrictor muscles. Cautery was used for completion of hemostasis. The tonsils were moderately enlarged , with large amounts of tonsilliths, and were discarded.  The pharynx was irrigated with saline and suctioned. An oral gastric tube was used to aspirate the contents of the stomach. The patient was then awakened from anesthesia and transferred to PACU in stable condition.   PATIENT DISPOSITION:  To PACU, stable

## 2023-12-01 ENCOUNTER — Encounter (HOSPITAL_COMMUNITY): Payer: Self-pay | Admitting: Otolaryngology

## 2023-12-01 ENCOUNTER — Other Ambulatory Visit (HOSPITAL_COMMUNITY): Payer: Self-pay

## 2023-12-01 ENCOUNTER — Other Ambulatory Visit: Payer: Self-pay

## 2023-12-01 DIAGNOSIS — J351 Hypertrophy of tonsils: Secondary | ICD-10-CM | POA: Diagnosis not present

## 2023-12-01 NOTE — Progress Notes (Signed)
 Subjective: No complications overnight.  Taking minimal oral liquids.  Objective: Vital signs in last 24 hours: Temp:  [97.7 F (36.5 C)-98.4 F (36.9 C)] 97.7 F (36.5 C) (05/15 0423) Pulse Rate:  [57-75] 64 (05/15 0423) Resp:  [10-18] 17 (05/15 0423) BP: (116-165)/(71-100) 125/71 (05/15 0423) SpO2:  [94 %-100 %] 98 % (05/15 0423) FiO2 (%):  [21 %] 21 % (05/15 0003) Weight:  [84.8 kg] 84.8 kg (05/14 0924) Weight change:  Last BM Date : 11/29/23  Intake/Output from previous day: 05/14 0701 - 05/15 0700 In: 500 [I.V.:500] Out: 5 [Blood:5] Intake/Output this shift: No intake/output data recorded.  PHYSICAL EXAM: Awake and alert.  Breathing well.  No bleeding.  Lab Results: No results for input(s): "WBC", "HGB", "HCT", "PLT" in the last 72 hours. BMET No results for input(s): "NA", "K", "CL", "CO2", "GLUCOSE", "BUN", "CREATININE", "CALCIUM " in the last 72 hours.  Studies/Results: No results found.  Medications: I have reviewed the patient's current medications.  Assessment/Plan: Stable postop day 1.  Inadequate oral intake.  Continue to monitor and discharge home once she is taking adequate liquids by mouth.  LOS: 0 days      [name] 12/01/2023, 8:46 AM

## 2023-12-01 NOTE — Discharge Summary (Signed)
 Physician Discharge Summary  Patient ID: [name] MRN: [id number] DOB/AGE: 1965/08/10 58 y.o.  Admit date: 11/30/2023 Discharge date: 12/01/2023  Admission Diagnoses:chronic tonsillitis  Discharge Diagnoses:  Principal Problem:   S/P tonsillectomy   Discharged Condition: good  Hospital Course: no complications  Consults: none  Significant Diagnostic Studies: none  Treatments: surgery: tonsillectomy  Discharge Exam: Blood pressure 132/77, pulse 71, temperature 98.4 F (36.9 C), temperature source Oral, resp. rate 16, height 5\' 1"  (1.549 m), weight 84.8 kg, last menstrual period 10/01/2016, SpO2 99%. PHYSICAL EXAM: Awake and alert, no bleeding,.  Disposition: Discharge disposition: 01-Home or Self Care       Discharge Instructions     Diet - low sodium heart healthy   Complete by: As directed    Increase activity slowly   Complete by: As directed       Allergies as of 12/01/2023       Reactions   Elemental Sulfur Hives, Swelling   Other    POLLEN AND DUST   Sulfur Hives, Swelling        Medication List     TAKE these medications    amoxicillin -clavulanate 875-125 MG tablet Commonly known as: AUGMENTIN  Take 1 tablet by mouth every 12 (twelve) hours.   carvedilol 12.5 MG tablet Commonly known as: COREG Take 12.5 mg by mouth 2 (two) times daily.   clonazePAM  0.5 MG tablet Commonly known as: KLONOPIN  Take one tablet by mouth daily as needed for severe anxiety   estradiol  0.1 MG/24HR patch Commonly known as: VIVELLE -DOT Place 1 patch onto the skin 2 (two) times a week.   famotidine  20 MG tablet Commonly known as: Pepcid  Take 1 tablet (20 mg total) by mouth 2 (two) times daily. What changed:  when to take this reasons to take this   fexofenadine  180 MG tablet Commonly known as: ALLEGRA  Take 1 tablet by mouth once daily What changed:  when to take this reasons to take this   fluticasone  50 MCG/ACT nasal spray Commonly known  as: FLONASE  Place 1 spray into both nostrils daily as needed for allergies.   HYDROcodone -acetaminophen  7.5-325 mg/15 ml solution Commonly known as: HYCET Take 15 mLs by mouth every 6 (six) hours as needed for moderate pain (pain score 4-6).   hydrOXYzine  25 MG tablet Commonly known as: ATARAX  Take 1 tablet (25 mg total) by mouth 3 (three) times daily as needed.   Linzess  145 MCG Caps capsule Generic drug: linaclotide  Take 1 capsule by mouth once daily   Olmesartan -amLODIPine -HCTZ 40-5-25 MG Tabs Take 1 tablet by mouth daily.   ondansetron  4 MG disintegrating tablet Commonly known as: ZOFRAN -ODT Dissolve 1 tablet (4 mg total) in mouth every 8 (eight) hours as needed for nausea or vomiting.   potassium chloride  SA 20 MEQ tablet Commonly known as: KLOR-CON  M Take 20 mEq by mouth 2 (two) times daily.   rosuvastatin  10 MG tablet Commonly known as: CRESTOR  Take 10 mg by mouth daily.   traZODone  50 MG tablet Commonly known as: DESYREL  Take 1 tablet (50 mg total) by mouth at bedtime. What changed:  when to take this reasons to take this   Vilazodone  HCl 20 MG Tabs Take 1 tablet (20 mg total) by mouth daily after breakfast.   Vitamin D (Ergocalciferol) 1.25 MG (50000 UNIT) Caps capsule Commonly known as: DRISDOL Take 50,000 Units by mouth every Monday.         Signed: Janita Mellow 12/01/2023, 4:54 PM

## 2023-12-01 NOTE — Progress Notes (Signed)
   12/01/23 0003  BiPAP/CPAP/SIPAP  $ Non-Invasive Ventilator  Non-Invasive Vent Subsequent;Non-Invasive Vent Initial  BiPAP/CPAP/SIPAP Pt Type Adult  BiPAP/CPAP/SIPAP Resmed  Mask Type Nasal mask  Dentures removed? Not applicable  Respiratory Rate 16 breaths/min  FiO2 (%) 21 %  Flow Rate 0 lpm  Patient Home Machine No  Patient Home Mask Yes  Patient Home Tubing No  Auto Titrate Yes  CPAP/SIPAP surface wiped down Yes  Device Plugged into RED Power Outlet Yes  BiPAP/CPAP /SiPAP Vitals  Bilateral Breath Sounds Clear

## 2023-12-22 ENCOUNTER — Other Ambulatory Visit: Payer: Self-pay

## 2024-01-06 ENCOUNTER — Ambulatory Visit (AMBULATORY_SURGERY_CENTER)

## 2024-01-06 ENCOUNTER — Encounter: Payer: Self-pay | Admitting: Internal Medicine

## 2024-01-06 VITALS — Ht 61.0 in | Wt 182.0 lb

## 2024-01-06 DIAGNOSIS — Z860100 Personal history of colon polyps, unspecified: Secondary | ICD-10-CM

## 2024-01-06 DIAGNOSIS — Z800 Family history of malignant neoplasm of digestive organs: Secondary | ICD-10-CM

## 2024-01-06 MED ORDER — NA SULFATE-K SULFATE-MG SULF 17.5-3.13-1.6 GM/177ML PO SOLN: 1.0000 | Freq: Once | ORAL | 0 refills | Status: AC

## 2024-01-06 NOTE — Progress Notes (Signed)

## 2024-01-11 ENCOUNTER — Other Ambulatory Visit: Payer: Self-pay | Admitting: Behavioral Health

## 2024-01-11 DIAGNOSIS — F331 Major depressive disorder, recurrent, moderate: Secondary | ICD-10-CM

## 2024-01-11 DIAGNOSIS — F411 Generalized anxiety disorder: Secondary | ICD-10-CM

## 2024-01-26 ENCOUNTER — Ambulatory Visit
Admission: RE | Admit: 2024-01-26 | Discharge: 2024-01-26 | Disposition: A | Discharge status: Home / Self Care | Source: Ambulatory Visit | Attending: Family Medicine | Admitting: Family Medicine

## 2024-01-26 DIAGNOSIS — Z1231 Encounter for screening mammogram for malignant neoplasm of breast: Secondary | ICD-10-CM

## 2024-01-27 ENCOUNTER — Encounter: Payer: Self-pay | Admitting: Internal Medicine

## 2024-01-27 ENCOUNTER — Ambulatory Visit (AMBULATORY_SURGERY_CENTER): Admitting: Internal Medicine

## 2024-01-27 VITALS — BP 130/82 | HR 64 | Temp 98.0°F | Resp 12 | Ht 61.0 in | Wt 182.0 lb

## 2024-01-27 DIAGNOSIS — K648 Other hemorrhoids: Secondary | ICD-10-CM

## 2024-01-27 DIAGNOSIS — D120 Benign neoplasm of cecum: Secondary | ICD-10-CM

## 2024-01-27 DIAGNOSIS — K635 Polyp of colon: Secondary | ICD-10-CM

## 2024-01-27 DIAGNOSIS — Z1211 Encounter for screening for malignant neoplasm of colon: Secondary | ICD-10-CM | POA: Diagnosis present

## 2024-01-27 DIAGNOSIS — Z800 Family history of malignant neoplasm of digestive organs: Secondary | ICD-10-CM

## 2024-01-27 MED ORDER — SODIUM CHLORIDE 0.9 % IV SOLN: 500.0000 mL | Freq: Once | INTRAVENOUS | Status: DC

## 2024-01-27 NOTE — Op Note (Signed)
 [facility] Patient Name: [name] Procedure Date: 01/27/2024 9:46 AM MRN: [id number] Endoscopist: Rosario Estefana Kidney , , [id number] Age: 57 Referring MD:  Date of Birth: 1966/04/07 Gender: Female Account #: [id number] Procedure:                Colonoscopy Indications:              Screening in patient at increased risk: Family                            history of 1st-degree relative with colorectal                            cancer before age 57 years Medicines:                Monitored Anesthesia Care Procedure:                Pre-Anesthesia Assessment:                           - Prior to the procedure, a History and Physical                            was performed, and patient medications and                            allergies were reviewed. The patient's tolerance of                            previous anesthesia was also reviewed. The risks                            and benefits of the procedure and the sedation                            options and risks were discussed with the patient.                            All questions were answered, and informed consent                            was obtained. Prior Anticoagulants: The patient has                            taken no anticoagulant or antiplatelet agents. ASA                            Grade Assessment: II - A patient with mild systemic                            disease. After reviewing the risks and benefits,                            the patient was deemed in satisfactory condition to  undergo the procedure.                           After obtaining informed consent, the colonoscope                            was passed under direct vision. Throughout the                            procedure, the patient's blood pressure, pulse, and                            oxygen saturations were monitored continuously. The                            Olympus Scope SN: [id number] was  introduced through                            the anus and advanced to the the terminal ileum.                            The colonoscopy was performed without difficulty.                            The patient tolerated the procedure well. The                            quality of the bowel preparation was excellent. The                            terminal ileum, ileocecal valve, appendiceal                            orifice, and rectum were photographed. Scope In: 9:53:51 AM Scope Out: 10:08:03 AM Scope Withdrawal Time: 0 hours 10 minutes 39 seconds  Total Procedure Duration: 0 hours 14 minutes 12 seconds  Findings:                 The terminal ileum appeared normal.                           A 3 mm polyp was found in the cecum. The polyp was                            sessile. The polyp was removed with a cold snare.                            Resection and retrieval were complete.                           A 4 mm polyp was found in the descending colon. The                            polyp was sessile. The polyp was removed  with a                            cold snare. Resection and retrieval were complete.                           Non-bleeding internal hemorrhoids were found during                            retroflexion. Complications:            No immediate complications. Estimated Blood Loss:     Estimated blood loss was minimal. Impression:               - The examined portion of the ileum was normal.                           - One 3 mm polyp in the cecum, removed with a cold                            snare. Resected and retrieved.                           - One 4 mm polyp in the descending colon, removed                            with a cold snare. Resected and retrieved.                           - Non-bleeding internal hemorrhoids. Recommendation:           - Discharge patient to home (with escort).                           - Await pathology results.                            - The findings and recommendations were discussed                            with the patient. Dr Estefana Federico Rosario Estefana Federico,  01/27/2024 10:12:22 AM

## 2024-01-27 NOTE — Progress Notes (Signed)
 Report given to PACU, vss

## 2024-01-27 NOTE — Patient Instructions (Addendum)
-   2 polyps removed and sent to pathology - Internal hemorrhoids - Await pathology results. - The findings and recommendations were discussed     with the patient.  YOU HAD AN ENDOSCOPIC PROCEDURE TODAY AT THE [facility]:   Refer to the procedure report that was given to you for any specific questions about what was found during the examination.  If the procedure report does not answer your questions, please call your gastroenterologist to clarify.  If you requested that your care partner not be given the details of your procedure findings, then the procedure report has been included in a sealed envelope for you to review at your convenience later.  YOU SHOULD EXPECT: Some feelings of bloating in the abdomen. Passage of more gas than usual.  Walking can help get rid of the air that was put into your GI tract during the procedure and reduce the bloating. If you had a lower endoscopy (such as a colonoscopy or flexible sigmoidoscopy) you may notice spotting of blood in your stool or on the toilet paper. If you underwent a bowel prep for your procedure, you may not have a normal bowel movement for a few days.  Please Note:  You might notice some irritation and congestion in your nose or some drainage.  This is from the oxygen used during your procedure.  There is no need for concern and it should clear up in a day or so.  SYMPTOMS TO REPORT IMMEDIATELY:  Following lower endoscopy (colonoscopy or flexible sigmoidoscopy):  Excessive amounts of blood in the stool  Significant tenderness or worsening of abdominal pains  Swelling of the abdomen that is new, acute  Fever of 100F or higher   For urgent or emergent issues, a gastroenterologist can be reached at any hour by calling [phone]. Do not use MyChart messaging for urgent concerns.    DIET:  We do recommend a small meal at first, but then you may proceed to your regular diet.  Drink plenty of fluids but you should avoid  alcoholic beverages for 24 hours.  ACTIVITY:  You should plan to take it easy for the rest of today and you should NOT DRIVE or use heavy machinery until tomorrow (because of the sedation medicines used during the test).    FOLLOW UP: Our staff will call the number listed on your records the next business day following your procedure.  We will call around 7:15- 8:00 am to check on you and address any questions or concerns that you may have regarding the information given to you following your procedure. If we do not reach you, we will leave a message.     If any biopsies were taken you will be contacted by phone or by letter within the next 1-3 weeks.  Please call us  at [phone] if you have not heard about the biopsies in 3 weeks.    SIGNATURES/CONFIDENTIALITY: You and/or your care partner have signed paperwork which will be entered into your electronic medical record.  These signatures attest to the fact that that the information above on your After Visit Summary has been reviewed and is understood.  Full responsibility of the confidentiality of this discharge information lies with you and/or your care-partner.

## 2024-01-27 NOTE — Progress Notes (Signed)
 GASTROENTEROLOGY PROCEDURE H&P NOTE   Primary Care Physician: Aileen Gravely, MD    Reason for Procedure:   Family history of colon cancer  Plan:    Colonoscopy  Patient is appropriate for endoscopic procedure(s) in the ambulatory ([facility]) setting.  The nature of the procedure, as well as the risks, benefits, and alternatives were carefully and thoroughly reviewed with the patient. Ample time for discussion and questions allowed. The patient understood, was satisfied, and agreed to proceed.     HPI: [name] is a 58 y.o. female who presents for colonoscopy for family history of colon cancer. Denies blood in stools, changes in bowel habits, or unintentional weight loss. Mother had colon cancer at age 62.  Colonoscopy 02/06/19: One small hyperplastic sigmoid colon polyp that was removed.   Past Medical History:  Diagnosis Date   Allergic rhinitis    Allergy    Anxiety    Depression    GERD (gastroesophageal reflux disease)    Hyperlipidemia    Hyperplastic colon polyp 10/2003   Hypertension    Menorrhagia    Obesity    PONV (postoperative nausea and vomiting)    Pre-diabetes    RLS (restless legs syndrome)    Sleep apnea     Past Surgical History:  Procedure Laterality Date   COLONOSCOPY     CYSTOSCOPY N/A 10/12/2016   Procedure: CYSTOSCOPY;  Surgeon: Evalene SHAUNNA Organ, MD;  Location: [facility];  Service: Gynecology;  Laterality: N/A;   ENDOMETRIAL ABLATION  2007   Dr. Rosalva at [facility]   FOOT SURGERY     LAPAROSCOPIC VAGINAL HYSTERECTOMY WITH SALPINGECTOMY Bilateral 10/12/2016   Procedure: LAPAROSCOPIC ASSISTED VAGINAL HYSTERECTOMY WITH SALPINGECTOMY;  Surgeon: Evalene SHAUNNA Organ, MD;  Location: [facility];  Service: Gynecology;  Laterality: Bilateral;  request to follow first case at around 10:30am.  Requests 2 1/2 hours for the case.   SALIVARY GLAND SURGERY  12-11   LEFT SIDE NODULE REMOVED WAS BENIGN   TONSILLECTOMY Bilateral 11/30/2023   Procedure:  TONSILLECTOMY;  Surgeon: Jesus Oliphant, MD;  [facility]: [facility];  Service: ENT;  Laterality: Bilateral;   TUBAL LIGATION      Prior to Admission medications   Medication Sig Start Date End Date Taking? Authorizing Provider  carvedilol  (COREG ) 12.5 MG tablet Take 12.5 mg by mouth 2 (two) times daily. 11/16/22 01/27/24 Yes [provider]  clonazePAM  (KLONOPIN ) 0.5 MG tablet Take one tablet by mouth daily as needed for severe anxiety 10/07/23  Yes White, Redell A, NP  cyclobenzaprine  (FLEXERIL ) 5 MG tablet Take 5 mg by mouth. 12/16/23  Yes [provider]  diclofenac (VOLTAREN) 75 MG EC tablet Take 75 mg by mouth 2 (two) times daily. 12/16/23  Yes [provider]  estradiol  (VIVELLE -DOT) 0.1 MG/24HR patch Place 1 patch onto the skin 2 (two) times a week. 11/02/22  Yes [provider]  Olmesartan -amLODIPine -HCTZ 40-5-25 MG TABS Take 1 tablet by mouth daily. 11/06/23  Yes [provider]  potassium chloride  SA (KLOR-CON  M) 20 MEQ tablet Take 20 mEq by mouth 2 (two) times daily.   Yes [provider]  rosuvastatin  (CRESTOR ) 10 MG tablet Take 10 mg by mouth daily.   Yes [provider]  traZODone  (DESYREL ) 50 MG tablet Take 1 tablet (50 mg total) by mouth at bedtime. 03/31/23  Yes White, Redell A, NP  Vilazodone  HCl 20 MG TABS Take 1 tablet (20 mg total) by mouth daily. 01/13/24  Yes Teresa Redell LABOR, NP  Vitamin D , Ergocalciferol , (DRISDOL ) 1.25 MG (50000 UNIT) CAPS capsule Take 50,000 Units by mouth every Monday.   Yes [provider]  famotidine  (PEPCID ) 20 MG tablet Take 1 tablet (20 mg total) by mouth 2 (two) times daily. 07/07/20   Norleen Lynwood ORN, MD  fexofenadine  (ALLEGRA ) 180 MG tablet Take 1 tablet by mouth once daily Patient not taking: No sig reported 12/31/20   Norleen Lynwood ORN, MD  fluticasone  (FLONASE ) 50 MCG/ACT nasal spray Place 1 spray into both nostrils daily as needed for allergies. 09/19/23 09/18/24  [provider]   HYDROcodone -acetaminophen  (HYCET) 7.5-325 mg/15 ml solution Take 15 mLs by mouth every 6 (six) hours as needed for moderate pain (pain score 4-6). Patient not taking: No sig reported 11/30/23   Jesus Oliphant, MD  hydrOXYzine  (ATARAX ) 25 MG tablet Take 1 tablet (25 mg total) by mouth 3 (three) times daily as needed. 10/07/23   Teresa Redell LABOR, NP  linaclotide  (LINZESS ) 145 MCG CAPS capsule Take 1 capsule by mouth once daily 12/02/21   Norleen Lynwood ORN, MD  ondansetron  (ZOFRAN -ODT) 4 MG disintegrating tablet Dissolve 1 tablet (4 mg total) in mouth every 8 (eight) hours as needed for nausea or vomiting. Patient not taking: No sig reported 11/30/23   Jesus Oliphant, MD    Current Outpatient Medications  Medication Sig Dispense Refill   carvedilol  (COREG ) 12.5 MG tablet Take 12.5 mg by mouth 2 (two) times daily.     clonazePAM  (KLONOPIN ) 0.5 MG tablet Take one tablet by mouth daily as needed for severe anxiety 30 tablet 1   cyclobenzaprine  (FLEXERIL ) 5 MG tablet Take 5 mg by mouth.     diclofenac (VOLTAREN) 75 MG EC tablet Take 75 mg by mouth 2 (two) times daily.     estradiol  (VIVELLE -DOT) 0.1 MG/24HR patch Place 1 patch onto the skin 2 (two) times a week.     Olmesartan -amLODIPine -HCTZ 40-5-25 MG TABS Take 1 tablet by mouth daily.     potassium chloride  SA (KLOR-CON  M) 20 MEQ tablet Take 20 mEq by mouth 2 (two) times daily.     rosuvastatin  (CRESTOR ) 10 MG tablet Take 10 mg by mouth daily.     traZODone  (DESYREL ) 50 MG tablet Take 1 tablet (50 mg total) by mouth at bedtime. 30 tablet 3   Vilazodone  HCl 20 MG TABS Take 1 tablet (20 mg total) by mouth daily. 90 tablet 0   Vitamin D , Ergocalciferol , (DRISDOL ) 1.25 MG (50000 UNIT) CAPS capsule Take 50,000 Units by mouth every Monday.     famotidine  (PEPCID ) 20 MG tablet Take 1 tablet (20 mg total) by mouth 2 (two) times daily. 180 tablet 3   fexofenadine  (ALLEGRA ) 180 MG tablet Take 1 tablet by mouth once daily (Patient not taking: No sig reported) 90 tablet 0    fluticasone  (FLONASE ) 50 MCG/ACT nasal spray Place 1 spray into both nostrils daily as needed for allergies.     HYDROcodone -acetaminophen  (HYCET) 7.5-325 mg/15 ml solution Take 15 mLs by mouth every 6 (six) hours as needed for moderate pain (pain score 4-6). (Patient not taking: No sig reported) 419 mL 0   hydrOXYzine  (ATARAX ) 25 MG tablet Take 1 tablet (25 mg total) by mouth 3 (three) times daily as needed. 90 tablet 3   linaclotide  (LINZESS ) 145 MCG CAPS capsule Take 1 capsule by mouth once daily 90 capsule 2   ondansetron  (ZOFRAN -ODT) 4 MG disintegrating tablet Dissolve 1 tablet (4 mg total) in mouth every 8 (eight) hours as needed for nausea  or vomiting. (Patient not taking: No sig reported) 20 tablet 0   Current Facility-Administered Medications  Medication Dose Route Frequency Provider Last Rate Last Admin   0.9 %  sodium chloride  infusion  500 mL Intravenous Once Federico Rosario BROCKS, MD        Allergies as of 01/27/2024 - Review Complete 01/27/2024  Allergen Reaction Noted   Elemental sulfur Hives and Swelling 01/08/2016   Sulfur Hives and Swelling 01/08/2016   Other Other (See Comments) 10/25/2011    Family History  Problem Relation Age of Onset   Colon polyps Mother    Cancer Mother        colon cancer at 43 yo   Colon cancer Mother    Drug abuse Father    Colon polyps Father    Breast cancer Maternal Aunt    Drug abuse Maternal Uncle    Schizophrenia Paternal Aunt    Heart attack Maternal Grandfather    Hyperlipidemia Other    Hypertension Other    Esophageal cancer Neg Hx    Rectal cancer Neg Hx    Stomach cancer Neg Hx     Social History   Socioeconomic History   Marital status: Divorced    Spouse name: Not on file   Number of children: 2   Years of education: 14   Highest education level: Associate degree: academic program  Occupational History   Occupation: Facilities manager: Advice worker [employer]    Employer: [employer]  Tobacco Use    Smoking status: Never   Smokeless tobacco: Never  Vaping Use   Vaping status: Never Used  Substance and Sexual Activity   Alcohol use: Not Currently    Comment: once every three to 4 months.   Drug use: No   Sexual activity: Not Currently    Birth control/protection: Surgical, Condom    Comment: tubal ligation,INTERCOUSRE AGE 59, SEXUAL PARTNERS QUESTION DECLINED  Other Topics Concern   Not on file  Social History Narrative   Lives in [place], [place] alone. Likes to walk, womens empowerment group once per month.    Social Drivers of Corporate investment banker Strain: Not on file  Food Insecurity: No Food Insecurity (11/30/2023)   Hunger Vital Sign    Worried About Running Out of Food in the Last Year: Never true    Ran Out of Food in the Last Year: Never true  Transportation Needs: No Transportation Needs (11/30/2023)   PRAPARE - Administrator, Civil Service (Medical): No    Lack of Transportation (Non-Medical): No  Physical Activity: Not on file  Stress: Not on file  Social Connections: Not on file  Intimate Partner Violence: Not At Risk (11/30/2023)   Humiliation, Afraid, Rape, and Kick questionnaire    Fear of Current or Ex-Partner: No    Emotionally Abused: No    Physically Abused: No    Sexually Abused: No    Physical Exam: Vital signs in last 24 hours: BP (!) 149/70   Pulse 70   Temp 98 F (36.7 C) (Temporal)   Ht 5' 1 (1.549 m)   Wt 182 lb (82.6 kg)   LMP 10/01/2016   SpO2 99%   BMI 34.39 kg/m  GEN: NAD EYE: Sclerae anicteric ENT: MMM CV: Non-tachycardic Pulm: No increased work of breathing GI: Soft, NT/ND NEURO:  Alert & Oriented   Estefana Federico, MD [facility]  01/27/2024 9:15 AM

## 2024-01-30 ENCOUNTER — Telehealth: Payer: Self-pay

## 2024-01-30 NOTE — Telephone Encounter (Signed)
  Follow up Call-     01/27/2024    8:48 AM  Call back number  Post procedure Call Back phone  # [phone]  Permission to leave phone message Yes     Patient questions:  Do you have a fever, pain , or abdominal swelling? No. Pain Score  0 *  Have you tolerated food without any problems? Yes.    Have you been able to return to your normal activities? Yes.    Do you have any questions about your discharge instructions: Diet   No. Medications  No. Follow up visit  No.  Do you have questions or concerns about your Care? No.  Actions: * If pain score is 4 or above: No action needed, pain <4.

## 2024-01-31 ENCOUNTER — Telehealth: Payer: Self-pay | Admitting: Internal Medicine

## 2024-01-31 LAB — SURGICAL PATHOLOGY

## 2024-01-31 NOTE — Telephone Encounter (Signed)
 Pt stated that she would like to discuss with the provider about hemorrhoid banding.  Pt was scheduled for an  Dr. Federico on 02/15/2024 at 9:30 AM for evaluation of her hemorrhoids. Pt made aware. Pt verbalized understanding with all questions answered.

## 2024-01-31 NOTE — Telephone Encounter (Signed)
 Patient requesting f/u cal to discuss hem banding. Please advise.

## 2024-02-01 ENCOUNTER — Ambulatory Visit: Payer: Self-pay | Admitting: Internal Medicine

## 2024-02-15 ENCOUNTER — Ambulatory Visit: Admitting: Internal Medicine

## 2024-02-15 ENCOUNTER — Encounter: Payer: Self-pay | Admitting: Internal Medicine

## 2024-02-15 VITALS — BP 140/90 | HR 71 | Ht 61.0 in | Wt 187.0 lb

## 2024-02-15 DIAGNOSIS — K642 Third degree hemorrhoids: Secondary | ICD-10-CM

## 2024-02-15 DIAGNOSIS — K649 Unspecified hemorrhoids: Secondary | ICD-10-CM

## 2024-02-15 NOTE — Progress Notes (Signed)
 PROCEDURE NOTE: The patient presents with symptomatic grade 2 hemorrhoids, requesting rubber band ligation of his/her hemorrhoidal disease.  All risks, benefits and alternative forms of therapy were described and informed consent was obtained.  Her hemorrhoids hurt and burn. She has had them for a long time. She started using Preparation H and Tucks pads, which seemed to help for a little while. She has never tried any prescription creams. Denies straining or sitting on the toilet for long time. Endorses constipation on occasion. She has one BM per day. Sometimes the hemorrhoids do prolapse outwards  In the Left Lateral Decubitus position anoscopic examination revealed grade 2 The anorectum was pre-medicated with nitroglycerin and Recticare The decision was made to band the posterior internal hemorrhoid, and the [facility] O'Regan System was used to perform band ligation without complication.  Digital anorectal examination was then performed to assure proper positioning of the band, and to adjust the banded tissue as required.  The patient was discharged home without pain or other issues.  Dietary and behavioral recommendations were given and along with follow-up instructions.     The following adjunctive treatments were recommended: Avoid straining or sitting on the toilet for long periods of time Okay to use Miralax PRN  The patient will return in 2-3 weeks for  follow-up and possible additional banding as required. No complications were encountered and the patient tolerated the procedure well.

## 2024-02-15 NOTE — Patient Instructions (Signed)
 HEMORRHOID BANDING PROCEDURE    FOLLOW-UP CARE   The procedure you have had should have been relatively painless since the banding of the area involved does not have nerve endings and there is no pain sensation.  The rubber band cuts off the blood supply to the hemorrhoid and the band may fall off as soon as 48 hours after the banding (the band may occasionally be seen in the toilet bowl following a bowel movement). You may notice a temporary feeling of fullness in the rectum which should respond adequately to plain Tylenol  or Motrin .  Following the banding, avoid strenuous exercise that evening and resume full activity the next day.  A sitz bath (soaking in a warm tub) or bidet is soothing, and can be useful for cleansing the area after bowel movements.     To avoid constipation, take two tablespoons of natural wheat bran, natural oat bran, flax, Benefiber or any over the counter fiber supplement and increase your water  intake to 7-8 glasses daily.    Unless you have been prescribed anorectal medication, do not put anything inside your rectum for two weeks: No suppositories, enemas, fingers, etc.  Occasionally, you may have more bleeding than usual after the banding procedure.  This is often from the untreated hemorrhoids rather than the treated one.  Don't be concerned if there is a tablespoon or so of blood.  If there is more blood than this, lie flat with your bottom higher than your head and apply an ice pack to the area. If the bleeding does not stop within a half an hour or if you feel faint, call our office at [phone] or go to the emergency room.  Problems are not common; however, if there is a substantial amount of bleeding, severe pain, chills, fever or difficulty passing urine (very rare) or other problems, you should call us  at [phone] or report to the nearest emergency room.  Do not stay seated continuously for more than 2-3 hours for a day or two after the procedure.   Tighten your buttock muscles 10-15 times every two hours and take 10-15 deep breaths every 1-2 hours.  Do not spend more than a few minutes on the toilet if you cannot empty your bowel; instead re-visit the toilet at a later time.   _______________________________________________________  If your blood pressure at your visit was 140/90 or greater, please contact your primary care physician to follow up on this.  _______________________________________________________  If you are age 9 or older, your body mass index should be between 23-30. Your Body mass index is 35.33 kg/m. If this is out of the aforementioned range listed, please consider follow up with your Primary Care Provider.  If you are age 31 or younger, your body mass index should be between 19-25. Your Body mass index is 35.33 kg/m. If this is out of the aformentioned range listed, please consider follow up with your Primary Care Provider.   ________________________________________________________  The [facility] providers would like to encourage you to use MYCHART to communicate with providers for non-urgent requests or questions.  Due to long hold times on the telephone, sending your provider a message by [facility] may be a faster and more efficient way to get a response.  Please allow 48 business hours for a response.  Please remember that this is for non-urgent requests.  _______________________________________________________  [facility] is using a team-based approach to care.  Your team is made up of your doctor and two  to three APPS. Our APPS (Nurse Practitioners and Physician Assistants) work with your physician to ensure care continuity for you. They are fully qualified to address your health concerns and develop a treatment plan. They communicate directly with your gastroenterologist to care for you. Seeing the Advanced Practice Practitioners on your physician's team can help you by facilitating care more  promptly, often allowing for earlier appointments, access to diagnostic testing, procedures, and other specialty referrals.    It was a pleasure to see you today!  Thank you for trusting me with your gastrointestinal care!

## 2024-03-13 ENCOUNTER — Encounter: Payer: Self-pay | Admitting: Internal Medicine

## 2024-03-13 ENCOUNTER — Ambulatory Visit (INDEPENDENT_AMBULATORY_CARE_PROVIDER_SITE_OTHER): Admitting: Internal Medicine

## 2024-03-13 VITALS — BP 146/90 | HR 84 | Ht 61.0 in | Wt 187.6 lb

## 2024-03-13 DIAGNOSIS — K641 Second degree hemorrhoids: Secondary | ICD-10-CM

## 2024-03-13 DIAGNOSIS — K649 Unspecified hemorrhoids: Secondary | ICD-10-CM

## 2024-03-13 NOTE — Patient Instructions (Addendum)
 HEMORRHOID BANDING PROCEDURE    FOLLOW-UP CARE   The procedure you have had should have been relatively painless since the banding of the area involved does not have nerve endings and there is no pain sensation.  The rubber band cuts off the blood supply to the hemorrhoid and the band may fall off as soon as 48 hours after the banding (the band may occasionally be seen in the toilet bowl following a bowel movement). You may notice a temporary feeling of fullness in the rectum which should respond adequately to plain Tylenol  or Motrin .  Following the banding, avoid strenuous exercise that evening and resume full activity the next day.  A sitz bath (soaking in a warm tub) or bidet is soothing, and can be useful for cleansing the area after bowel movements.     To avoid constipation, take two tablespoons of natural wheat bran, natural oat bran, flax, Benefiber or any over the counter fiber supplement and increase your water  intake to 7-8 glasses daily.    Unless you have been prescribed anorectal medication, do not put anything inside your rectum for two weeks: No suppositories, enemas, fingers, etc.  Occasionally, you may have more bleeding than usual after the banding procedure.  This is often from the untreated hemorrhoids rather than the treated one.  Don't be concerned if there is a tablespoon or so of blood.  If there is more blood than this, lie flat with your bottom higher than your head and apply an ice pack to the area. If the bleeding does not stop within a half an hour or if you feel faint, call our office at [phone] or go to the emergency room.  Problems are not common; however, if there is a substantial amount of bleeding, severe pain, chills, fever or difficulty passing urine (very rare) or other problems, you should call us  at [phone] or report to the nearest emergency room.  Do not stay seated continuously for more than 2-3 hours for a day or two after the procedure.   Tighten your buttock muscles 10-15 times every two hours and take 10-15 deep breaths every 1-2 hours.  Do not spend more than a few minutes on the toilet if you cannot empty your bowel; instead re-visit the toilet at a later time.   Please follow up on 03/29/24 at 10:30am

## 2024-03-13 NOTE — Progress Notes (Signed)
 PROCEDURE NOTE: The patient presents with symptomatic grade 2  hemorrhoids, requesting rubber band ligation of his/her hemorrhoidal disease.  All risks, benefits and alternative forms of therapy were described and informed consent was obtained.  In the Left Lateral Decubitus position anoscopic examination revealed grade 2 hemorrhoids The anorectum was pre-medicated with nitroglycerin and Recticare The decision was made to band the right anterior internal hemorrhoid, and the [facility] O'Regan System was used to perform band ligation without complication.  Digital anorectal examination was then performed to assure proper positioning of the band, and to adjust the banded tissue as required.  The patient was discharged home without pain or other issues.  Dietary and behavioral recommendations were given and along with follow-up instructions.    The patient will return in 2 weeks for  follow-up and possible additional banding as required. No complications were encountered and the patient tolerated the procedure well.

## 2024-03-29 ENCOUNTER — Ambulatory Visit: Admitting: Internal Medicine

## 2024-03-29 ENCOUNTER — Encounter: Payer: Self-pay | Admitting: Internal Medicine

## 2024-03-29 VITALS — BP 128/78 | HR 76 | Ht 61.0 in | Wt 185.5 lb

## 2024-03-29 DIAGNOSIS — K641 Second degree hemorrhoids: Secondary | ICD-10-CM

## 2024-03-29 DIAGNOSIS — K649 Unspecified hemorrhoids: Secondary | ICD-10-CM

## 2024-03-29 MED ORDER — PANTOPRAZOLE SODIUM 40 MG PO TBEC: 40.0000 mg | DELAYED_RELEASE_TABLET | Freq: Every day | ORAL | 3 refills | Status: AC

## 2024-03-29 MED ORDER — LINACLOTIDE 145 MCG PO CAPS: 145.0000 ug | ORAL_CAPSULE | Freq: Every day | ORAL | 3 refills | Status: AC

## 2024-03-29 NOTE — Patient Instructions (Addendum)
 We have sent the following medications to your pharmacy for you to pick up at your convenience:  Linzess  , Pantoprazole    Follow up in 6 months or earlier if needed.  Thank you for trusting me with your gastrointestinal care!    Y. Estefana Kidney, MD  _______________________________________________________  If your blood pressure at your visit was 140/90 or greater, please contact your primary care physician to follow up on this.  _______________________________________________________  If you are age 70 or older, your body mass index should be between 23-30. Your Body mass index is 35.05 kg/m. If this is out of the aforementioned range listed, please consider follow up with your Primary Care Provider.  If you are age 56 or younger, your body mass index should be between 19-25. Your Body mass index is 35.05 kg/m. If this is out of the aformentioned range listed, please consider follow up with your Primary Care Provider.   ________________________________________________________  The [facility] providers would like to encourage you to use MYCHART to communicate with providers for non-urgent requests or questions.  Due to long hold times on the telephone, sending your provider a message by [facility] may be a faster and more efficient way to get a response.  Please allow 48 business hours for a response.  Please remember that this is for non-urgent requests.  _______________________________________________________  [facility] is using a team-based approach to care.  Your team is made up of your doctor and two to three APPS. Our APPS (Nurse Practitioners and Physician Assistants) work with your physician to ensure care continuity for you. They are fully qualified to address your health concerns and develop a treatment plan. They communicate directly with your gastroenterologist to care for you. Seeing the Advanced Practice Practitioners on your physician's team can help you by  facilitating care more promptly, often allowing for earlier appointments, access to diagnostic testing, procedures, and other specialty referrals.

## 2024-03-29 NOTE — Progress Notes (Signed)
 PROCEDURE NOTE: The patient presents with symptomatic grade 2  hemorrhoids, requesting rubber band ligation of his/her hemorrhoidal disease.  All risks, benefits and alternative forms of therapy were described and informed consent was obtained.  In the Left Lateral Decubitus position anoscopic examination revealed grade 2 hemorrhoids The anorectum was pre-medicated with nitroglycerin and Recticare The decision was made to band the left anterior internal hemorrhoid, and the [facility] O'Regan System was used to perform band ligation without complication.  Digital anorectal examination was then performed to assure proper positioning of the band, and to adjust the banded tissue as required.  The patient was discharged home without pain or other issues.  Dietary and behavioral recommendations were given and along with follow-up instructions.    Refilled Protonix  and Linzess  today  The patient will return in 6 months for  follow-up of constipation and acid reflux No complications were encountered and the patient tolerated the procedure well.

## 2024-04-09 ENCOUNTER — Encounter: Payer: Self-pay | Admitting: Behavioral Health

## 2024-04-09 ENCOUNTER — Ambulatory Visit (INDEPENDENT_AMBULATORY_CARE_PROVIDER_SITE_OTHER): Admitting: Behavioral Health

## 2024-04-09 DIAGNOSIS — F331 Major depressive disorder, recurrent, moderate: Secondary | ICD-10-CM | POA: Diagnosis not present

## 2024-04-09 DIAGNOSIS — F411 Generalized anxiety disorder: Secondary | ICD-10-CM | POA: Diagnosis not present

## 2024-04-09 MED ORDER — HYDROXYZINE HCL 25 MG PO TABS: 25.0000 mg | ORAL_TABLET | Freq: Three times a day (TID) | ORAL | 3 refills | Status: AC | PRN

## 2024-04-09 MED ORDER — VILAZODONE HCL 20 MG PO TABS: 1.0000 | ORAL_TABLET | Freq: Every day | ORAL | 1 refills | Status: DC

## 2024-04-09 MED ORDER — CLONAZEPAM 0.5 MG PO TABS: ORAL_TABLET | ORAL | 3 refills | Status: AC
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
## 2024-06-18 ENCOUNTER — Encounter: Payer: Self-pay | Admitting: Behavioral Health

## 2024-06-18 ENCOUNTER — Telehealth: Admitting: Behavioral Health

## 2024-06-18 DIAGNOSIS — F99 Mental disorder, not otherwise specified: Secondary | ICD-10-CM

## 2024-06-18 DIAGNOSIS — F5105 Insomnia due to other mental disorder: Secondary | ICD-10-CM

## 2024-06-18 DIAGNOSIS — F331 Major depressive disorder, recurrent, moderate: Secondary | ICD-10-CM | POA: Diagnosis not present

## 2024-06-18 DIAGNOSIS — F411 Generalized anxiety disorder: Secondary | ICD-10-CM

## 2024-06-18 MED ORDER — VILAZODONE HCL 20 MG PO TABS: 1.0000 | ORAL_TABLET | Freq: Every day | ORAL | 1 refills | Status: AC

## 2024-06-18 MED ORDER — BUPROPION HCL 75 MG PO TABS: 75.0000 mg | ORAL_TABLET | Freq: Two times a day (BID) | ORAL | 1 refills | Status: AC

## 2024-06-18 NOTE — Progress Notes (Signed)
 [name] [id number] Sep 22, 1965 58 y.o.  Virtual Visit via Video Note  I connected with pt @ on 06/18/24 at 10:30 AM EST by a video enabled telemedicine application and verified that I am speaking with the correct person using two identifiers.   I discussed the limitations of evaluation and management by telemedicine and the availability of in person appointments. The patient expressed understanding and agreed to proceed.  I discussed the assessment and treatment plan with the patient. The patient was provided an opportunity to ask questions and all were answered. The patient agreed with the plan and demonstrated an understanding of the instructions.   The patient was advised to call back or seek an in-person evaluation if the symptoms worsen or if the condition fails to improve as anticipated.  I provided 30 minutes of non-face-to-face time during this encounter.  The patient was located at home.  The provider was located at [facility].   Tara DELENA Pizza, NP   Subjective:   Patient ID:  [name] is a 58 y.o. (DOB Dec 02, 1965) female.  Chief Complaint:  Chief Complaint  Patient presents with   Depression   Anxiety   Follow-up   Medication Refill   Family Problem   Patient Education   Stress    HPI  [name], 58 year old female presents to this office for follow up and medication management. Collateral information should be considered reliable. No psychosocial changes this visit.  She is reporting feeling much better today.  Son is living at home but is going to therapy as needed.  For now she is requesting no medication changes.  Numerically rates depression at 2/10, and anxiety 2/10.  Sleep has improved since now on CPAP.  She denies any history of mania, no psychosis, no auditory or visual hallucinations.  Denies SI and HI.  Patient states that she feels safe and verbally contracts for safety with this clinical research associate.   Past psychiatric medication  trials: Zoloft    Review of Systems:  Review of Systems  Constitutional: Negative.   Allergic/Immunologic: Negative.   Neurological: Negative.   Psychiatric/Behavioral:  Positive for dysphoric mood.     Medications: I have reviewed the patient's current medications.  Current Outpatient Medications  Medication Sig Dispense Refill   buPROPion  (WELLBUTRIN ) 75 MG tablet Take 1 tablet (75 mg total) by mouth 2 (two) times daily. 90 tablet 1   carvedilol  (COREG ) 12.5 MG tablet Take 12.5 mg by mouth 2 (two) times daily.     clonazePAM  (KLONOPIN ) 0.5 MG tablet Take one tablet by mouth daily as needed for severe anxiety 30 tablet 3   cyclobenzaprine  (FLEXERIL ) 5 MG tablet Take 5 mg by mouth.     diclofenac (VOLTAREN) 75 MG EC tablet Take 75 mg by mouth 2 (two) times daily.     famotidine  (PEPCID ) 20 MG tablet Take 1 tablet (20 mg total) by mouth 2 (two) times daily. 180 tablet 3   fexofenadine  (ALLEGRA ) 180 MG tablet Take 1 tablet by mouth once daily 90 tablet 0   fluticasone  (FLONASE ) 50 MCG/ACT nasal spray Place 1 spray into both nostrils daily as needed for allergies.     hydrOXYzine  (ATARAX ) 25 MG tablet Take 1 tablet (25 mg total) by mouth 3 (three) times daily as needed. 90 tablet 3   linaclotide  (LINZESS ) 145 MCG CAPS capsule Take 1 capsule (145 mcg total) by mouth daily. 90 capsule 3   Olmesartan -amLODIPine -HCTZ 40-5-25 MG TABS Take 1 tablet by mouth daily.  ondansetron  (ZOFRAN -ODT) 4 MG disintegrating tablet Dissolve 1 tablet (4 mg total) in mouth every 8 (eight) hours as needed for nausea or vomiting. 20 tablet 0   pantoprazole  (PROTONIX ) 40 MG tablet Take 1 tablet (40 mg total) by mouth daily. 90 tablet 3   potassium chloride  SA (KLOR-CON  M) 20 MEQ tablet Take 20 mEq by mouth 2 (two) times daily.     rosuvastatin  (CRESTOR ) 10 MG tablet Take 10 mg by mouth daily.     traZODone  (DESYREL ) 50 MG tablet Take 1 tablet (50 mg total) by mouth at bedtime. 30 tablet 3   Vilazodone  HCl 20 MG  TABS Take 1 tablet (20 mg total) by mouth daily. 90 tablet 1   Vitamin D , Ergocalciferol , (DRISDOL ) 1.25 MG (50000 UNIT) CAPS capsule Take 50,000 Units by mouth every Monday.     ZEPBOUND 2.5 MG/0.5ML Pen Inject 2.5 mg into the skin.     No current facility-administered medications for this visit.    Medication Side Effects: None  Allergies:  Allergies  Allergen Reactions   Elemental Sulfur Hives and Swelling    Swelling to eyes only   Sulfur Hives and Swelling    Eyes only   Other Other (See Comments)    POLLEN AND DUST Sneezing and runny nose    Past Medical History:  Diagnosis Date   Allergic rhinitis    Allergy    Anxiety    Depression    GERD (gastroesophageal reflux disease)    Hyperlipidemia    Hyperplastic colon polyp 10/2003   Hypertension    Menorrhagia    Obesity    PONV (postoperative nausea and vomiting)    Pre-diabetes    RLS (restless legs syndrome)    Sleep apnea     Family History  Problem Relation Age of Onset   Colon polyps Mother    Cancer Mother        colon cancer at 83 yo   Colon cancer Mother    Drug abuse Father    Colon polyps Father    Breast cancer Maternal Aunt    Drug abuse Maternal Uncle    Schizophrenia Paternal Aunt    Heart attack Maternal Grandfather    Hyperlipidemia Other    Hypertension Other    Esophageal cancer Neg Hx    Rectal cancer Neg Hx    Stomach cancer Neg Hx     Social History   Socioeconomic History   Marital status: Divorced    Spouse name: Not on file   Number of children: 2   Years of education: 14   Highest education level: Associate degree: academic program  Occupational History   Occupation: Facilities Manager: ADVICE WORKER [employer]    Employer: [employer]  Tobacco Use   Smoking status: Never   Smokeless tobacco: Never  Vaping Use   Vaping status: Never Used  Substance and Sexual Activity   Alcohol use: Not Currently    Comment: once every three to 4 months.   Drug use:  No   Sexual activity: Not Currently    Birth control/protection: Surgical, Condom    Comment: tubal ligation,INTERCOUSRE AGE 54, SEXUAL PARTNERS QUESTION DECLINED  Other Topics Concern   Not on file  Social History Narrative   Lives in [place], [place] alone. Likes to walk, womens empowerment group once per month.    Social Drivers of Corporate Investment Banker Strain: Not on file  Food Insecurity: No Food Insecurity (11/30/2023)  Hunger Vital Sign    Worried About Running Out of Food in the Last Year: Never true    Ran Out of Food in the Last Year: Never true  Transportation Needs: No Transportation Needs (11/30/2023)   PRAPARE - Administrator, Civil Service (Medical): No    Lack of Transportation (Non-Medical): No  Physical Activity: Not on file  Stress: Not on file  Social Connections: Not on file  Intimate Partner Violence: Not At Risk (11/30/2023)   Humiliation, Afraid, Rape, and Kick questionnaire    Fear of Current or Ex-Partner: No    Emotionally Abused: No    Physically Abused: No    Sexually Abused: No    Past Medical History, Surgical history, Social history, and Family history were reviewed and updated as appropriate.   Please see review of systems for further details on the patient's review from today.   Objective:   Physical Exam:  LMP 10/01/2016   Physical Exam Neurological:     Mental Status: She is alert and oriented to person, place, and time.  Psychiatric:        Attention and Perception: Attention and perception normal.        Mood and Affect: Mood normal.        Speech: Speech normal.        Behavior: Behavior normal. Behavior is cooperative.        Cognition and Memory: Cognition and memory normal.        Judgment: Judgment normal.     Comments: Insight intact     Lab Review:     Component Value Date/Time   NA 139 11/15/2023 2216   K 3.1 (L) 11/15/2023 2216   CL 99 11/15/2023 2216   CO2 25 11/15/2023 2216   GLUCOSE 85  11/15/2023 2216   BUN 17 11/15/2023 2216   CREATININE 1.01 (H) 11/15/2023 2216   CALCIUM  10.3 11/15/2023 2216   PROT 8.3 05/18/2023 1559   ALBUMIN 4.5 05/18/2023 1559   AST 19 05/18/2023 1559   ALT 7 05/18/2023 1559   ALKPHOS 57 05/18/2023 1559   BILITOT 0.6 05/18/2023 1559   GFRNONAA >60 11/15/2023 2216   GFRAA >60 10/21/2018 2325       Component Value Date/Time   WBC 9.2 11/15/2023 2216   RBC 4.37 11/15/2023 2216   HGB 13.2 11/15/2023 2216   HCT 39.8 11/15/2023 2216   PLT 213 11/15/2023 2216   MCV 91.1 11/15/2023 2216   MCH 30.2 11/15/2023 2216   MCHC 33.2 11/15/2023 2216   RDW 14.3 11/15/2023 2216   LYMPHSABS 3.3 11/15/2023 2216   MONOABS 0.8 11/15/2023 2216   EOSABS 0.0 11/15/2023 2216   BASOSABS 0.0 11/15/2023 2216    No results found for: POCLITH, LITHIUM   No results found for: PHENYTOIN, PHENOBARB, VALPROATE, CBMZ   .res Assessment: Plan:    Greater than 50% of  30 min face to face time with patient was spent on counseling and coordination of care.  We discussed her current level of stability.  She is reporting improvement at home with her son for now she is happy with her medications and requesting no changes this visit.     We agreed to:   To continue Viibryd  20 mg daily, must take with food.  Continue hydroxyzine  25 mg three times daily as needed. Continue Klonopin  0.5 mg daily only for severe anxiety Stopped trazodone  50 mg at bedtime daily Now on CPAP at night. Educated on good sleep  hygiene  Will report worsening symptoms promptly To follow-up in 3 months to reassess Provided emergency contact information  Tara A. Juelz Whittenberg, NP   [name] was seen today for depression, anxiety, follow-up, medication refill, family problem, patient education and stress.  Diagnoses and all orders for this visit:  Generalized anxiety disorder -     buPROPion (WELLBUTRIN) 75 MG tablet; Take 1 tablet (75 mg total) by mouth 2 (two) times daily. -      Vilazodone  HCl 20 MG TABS; Take 1 tablet (20 mg total) by mouth daily.  Major depressive disorder, recurrent episode, moderate (HCC) -     buPROPion (WELLBUTRIN) 75 MG tablet; Take 1 tablet (75 mg total) by mouth 2 (two) times daily. -     Vilazodone  HCl 20 MG TABS; Take 1 tablet (20 mg total) by mouth daily.  Insomnia due to other mental disorder     Please see After Visit Summary for patient specific instructions.  No future appointments.  No orders of the defined types were placed in this encounter.     -------------------------------

## 2024-09-18 ENCOUNTER — Telehealth: Admitting: Behavioral Health
